# Patient Record
Sex: Male | Born: 1940
Health system: Southern US, Community
[De-identification: ages and names within clinical notes are randomized; demographics above are authoritative.]

## PROBLEM LIST (undated history)

## (undated) DIAGNOSIS — I10 Essential (primary) hypertension: Secondary | ICD-10-CM

## (undated) DIAGNOSIS — C719 Malignant neoplasm of brain, unspecified: Secondary | ICD-10-CM

## (undated) DIAGNOSIS — E7800 Pure hypercholesterolemia, unspecified: Secondary | ICD-10-CM

## (undated) DIAGNOSIS — E079 Disorder of thyroid, unspecified: Secondary | ICD-10-CM

## (undated) DIAGNOSIS — M1990 Unspecified osteoarthritis, unspecified site: Secondary | ICD-10-CM

## (undated) DIAGNOSIS — K219 Gastro-esophageal reflux disease without esophagitis: Secondary | ICD-10-CM

## (undated) DIAGNOSIS — E119 Type 2 diabetes mellitus without complications: Secondary | ICD-10-CM

## (undated) HISTORY — PX: HERNIA REPAIR: SHX51

## (undated) HISTORY — PX: OTHER SURGICAL HISTORY: SHX169

## (undated) HISTORY — PX: CRANIOTOMY: SHX93

---

## 2013-08-10 DIAGNOSIS — K4090 Unilateral inguinal hernia, without obstruction or gangrene, not specified as recurrent: Secondary | ICD-10-CM | POA: Insufficient documentation

## 2014-04-11 DIAGNOSIS — E785 Hyperlipidemia, unspecified: Secondary | ICD-10-CM | POA: Insufficient documentation

## 2015-01-16 DIAGNOSIS — E119 Type 2 diabetes mellitus without complications: Secondary | ICD-10-CM | POA: Insufficient documentation

## 2016-07-06 DIAGNOSIS — Z85828 Personal history of other malignant neoplasm of skin: Secondary | ICD-10-CM | POA: Diagnosis not present

## 2016-07-06 DIAGNOSIS — Z08 Encounter for follow-up examination after completed treatment for malignant neoplasm: Secondary | ICD-10-CM | POA: Diagnosis not present

## 2016-07-06 DIAGNOSIS — L821 Other seborrheic keratosis: Secondary | ICD-10-CM | POA: Diagnosis not present

## 2016-09-07 DIAGNOSIS — E119 Type 2 diabetes mellitus without complications: Secondary | ICD-10-CM | POA: Diagnosis not present

## 2016-09-07 DIAGNOSIS — E782 Mixed hyperlipidemia: Secondary | ICD-10-CM | POA: Diagnosis not present

## 2016-09-07 DIAGNOSIS — I10 Essential (primary) hypertension: Secondary | ICD-10-CM | POA: Diagnosis not present

## 2016-09-07 DIAGNOSIS — Z125 Encounter for screening for malignant neoplasm of prostate: Secondary | ICD-10-CM | POA: Diagnosis not present

## 2016-09-17 DIAGNOSIS — E119 Type 2 diabetes mellitus without complications: Secondary | ICD-10-CM | POA: Diagnosis not present

## 2016-09-17 DIAGNOSIS — Z125 Encounter for screening for malignant neoplasm of prostate: Secondary | ICD-10-CM | POA: Diagnosis not present

## 2016-09-17 DIAGNOSIS — I10 Essential (primary) hypertension: Secondary | ICD-10-CM | POA: Diagnosis not present

## 2016-09-17 DIAGNOSIS — E782 Mixed hyperlipidemia: Secondary | ICD-10-CM | POA: Diagnosis not present

## 2017-01-31 DIAGNOSIS — Z23 Encounter for immunization: Secondary | ICD-10-CM | POA: Diagnosis not present

## 2017-06-09 DIAGNOSIS — E668 Other obesity: Secondary | ICD-10-CM | POA: Diagnosis not present

## 2017-06-09 DIAGNOSIS — R000 Tachycardia, unspecified: Secondary | ICD-10-CM | POA: Diagnosis not present

## 2017-06-09 DIAGNOSIS — E7849 Other hyperlipidemia: Secondary | ICD-10-CM | POA: Diagnosis not present

## 2017-06-09 DIAGNOSIS — I10 Essential (primary) hypertension: Secondary | ICD-10-CM | POA: Diagnosis not present

## 2017-06-09 DIAGNOSIS — N401 Enlarged prostate with lower urinary tract symptoms: Secondary | ICD-10-CM | POA: Diagnosis not present

## 2017-06-09 DIAGNOSIS — Z1389 Encounter for screening for other disorder: Secondary | ICD-10-CM | POA: Diagnosis not present

## 2017-06-09 DIAGNOSIS — Z6833 Body mass index (BMI) 33.0-33.9, adult: Secondary | ICD-10-CM | POA: Diagnosis not present

## 2017-06-09 DIAGNOSIS — E119 Type 2 diabetes mellitus without complications: Secondary | ICD-10-CM | POA: Diagnosis not present

## 2017-06-09 DIAGNOSIS — D126 Benign neoplasm of colon, unspecified: Secondary | ICD-10-CM | POA: Diagnosis not present

## 2017-07-07 DIAGNOSIS — E119 Type 2 diabetes mellitus without complications: Secondary | ICD-10-CM | POA: Diagnosis not present

## 2017-07-07 DIAGNOSIS — Z6833 Body mass index (BMI) 33.0-33.9, adult: Secondary | ICD-10-CM | POA: Diagnosis not present

## 2017-07-07 DIAGNOSIS — I10 Essential (primary) hypertension: Secondary | ICD-10-CM | POA: Diagnosis not present

## 2017-10-11 DIAGNOSIS — Z6832 Body mass index (BMI) 32.0-32.9, adult: Secondary | ICD-10-CM | POA: Diagnosis not present

## 2017-10-11 DIAGNOSIS — I10 Essential (primary) hypertension: Secondary | ICD-10-CM | POA: Diagnosis not present

## 2017-10-11 DIAGNOSIS — N401 Enlarged prostate with lower urinary tract symptoms: Secondary | ICD-10-CM | POA: Diagnosis not present

## 2017-10-11 DIAGNOSIS — E119 Type 2 diabetes mellitus without complications: Secondary | ICD-10-CM | POA: Diagnosis not present

## 2017-10-11 DIAGNOSIS — E668 Other obesity: Secondary | ICD-10-CM | POA: Diagnosis not present

## 2017-10-11 DIAGNOSIS — E7849 Other hyperlipidemia: Secondary | ICD-10-CM | POA: Diagnosis not present

## 2018-01-05 DIAGNOSIS — Z23 Encounter for immunization: Secondary | ICD-10-CM | POA: Diagnosis not present

## 2018-02-08 DIAGNOSIS — D126 Benign neoplasm of colon, unspecified: Secondary | ICD-10-CM | POA: Diagnosis not present

## 2018-02-08 DIAGNOSIS — J3089 Other allergic rhinitis: Secondary | ICD-10-CM | POA: Diagnosis not present

## 2018-02-08 DIAGNOSIS — I10 Essential (primary) hypertension: Secondary | ICD-10-CM | POA: Diagnosis not present

## 2018-02-08 DIAGNOSIS — Z6830 Body mass index (BMI) 30.0-30.9, adult: Secondary | ICD-10-CM | POA: Diagnosis not present

## 2018-02-08 DIAGNOSIS — N401 Enlarged prostate with lower urinary tract symptoms: Secondary | ICD-10-CM | POA: Diagnosis not present

## 2018-02-08 DIAGNOSIS — E119 Type 2 diabetes mellitus without complications: Secondary | ICD-10-CM | POA: Diagnosis not present

## 2018-02-08 DIAGNOSIS — E7849 Other hyperlipidemia: Secondary | ICD-10-CM | POA: Diagnosis not present

## 2018-02-15 ENCOUNTER — Emergency Department (HOSPITAL_COMMUNITY): Payer: Medicare Other

## 2018-02-15 ENCOUNTER — Encounter (HOSPITAL_COMMUNITY): Payer: Self-pay | Admitting: Emergency Medicine

## 2018-02-15 ENCOUNTER — Observation Stay (HOSPITAL_COMMUNITY)
Admission: EM | Admit: 2018-02-15 | Discharge: 2018-02-16 | Disposition: A | Discharge status: Home / Self Care | Payer: Medicare Other | Attending: Internal Medicine | Admitting: Internal Medicine

## 2018-02-15 DIAGNOSIS — C719 Malignant neoplasm of brain, unspecified: Secondary | ICD-10-CM | POA: Diagnosis not present

## 2018-02-15 DIAGNOSIS — Z79899 Other long term (current) drug therapy: Secondary | ICD-10-CM | POA: Insufficient documentation

## 2018-02-15 DIAGNOSIS — I10 Essential (primary) hypertension: Secondary | ICD-10-CM | POA: Diagnosis not present

## 2018-02-15 DIAGNOSIS — G936 Cerebral edema: Secondary | ICD-10-CM | POA: Diagnosis not present

## 2018-02-15 DIAGNOSIS — R1310 Dysphagia, unspecified: Secondary | ICD-10-CM | POA: Diagnosis not present

## 2018-02-15 DIAGNOSIS — R911 Solitary pulmonary nodule: Secondary | ICD-10-CM | POA: Diagnosis not present

## 2018-02-15 DIAGNOSIS — Z87891 Personal history of nicotine dependence: Secondary | ICD-10-CM | POA: Diagnosis not present

## 2018-02-15 DIAGNOSIS — E119 Type 2 diabetes mellitus without complications: Secondary | ICD-10-CM | POA: Insufficient documentation

## 2018-02-15 DIAGNOSIS — Z7984 Long term (current) use of oral hypoglycemic drugs: Secondary | ICD-10-CM | POA: Diagnosis not present

## 2018-02-15 DIAGNOSIS — R29810 Facial weakness: Secondary | ICD-10-CM | POA: Diagnosis present

## 2018-02-15 DIAGNOSIS — N400 Enlarged prostate without lower urinary tract symptoms: Secondary | ICD-10-CM | POA: Insufficient documentation

## 2018-02-15 DIAGNOSIS — R479 Unspecified speech disturbances: Secondary | ICD-10-CM | POA: Diagnosis not present

## 2018-02-15 DIAGNOSIS — R262 Difficulty in walking, not elsewhere classified: Secondary | ICD-10-CM | POA: Diagnosis not present

## 2018-02-15 DIAGNOSIS — G9389 Other specified disorders of brain: Secondary | ICD-10-CM

## 2018-02-15 DIAGNOSIS — R0602 Shortness of breath: Secondary | ICD-10-CM | POA: Diagnosis not present

## 2018-02-15 DIAGNOSIS — E7800 Pure hypercholesterolemia, unspecified: Secondary | ICD-10-CM | POA: Diagnosis not present

## 2018-02-15 HISTORY — DX: Gastro-esophageal reflux disease without esophagitis: K21.9

## 2018-02-15 HISTORY — DX: Unspecified osteoarthritis, unspecified site: M19.90

## 2018-02-15 HISTORY — DX: Disorder of thyroid, unspecified: E07.9

## 2018-02-15 HISTORY — DX: Essential (primary) hypertension: I10

## 2018-02-15 HISTORY — DX: Type 2 diabetes mellitus without complications: E11.9

## 2018-02-15 HISTORY — DX: Pure hypercholesterolemia, unspecified: E78.00

## 2018-02-15 LAB — CBC
HCT: 38.9 % — ABNORMAL LOW (ref 39.0–52.0)
Hemoglobin: 12.9 g/dL — ABNORMAL LOW (ref 13.0–17.0)
MCH: 31.1 pg (ref 26.0–34.0)
MCHC: 33.2 g/dL (ref 30.0–36.0)
MCV: 93.7 fL (ref 80.0–100.0)
PLATELETS: 234 10*3/uL (ref 150–400)
RBC: 4.15 MIL/uL — ABNORMAL LOW (ref 4.22–5.81)
RDW: 12.3 % (ref 11.5–15.5)
WBC: 6.9 10*3/uL (ref 4.0–10.5)
nRBC: 0 % (ref 0.0–0.2)

## 2018-02-15 LAB — BASIC METABOLIC PANEL
Anion gap: 8 (ref 5–15)
BUN: 20 mg/dL (ref 8–23)
CALCIUM: 9.4 mg/dL (ref 8.9–10.3)
CO2: 23 mmol/L (ref 22–32)
CREATININE: 1.04 mg/dL (ref 0.61–1.24)
Chloride: 106 mmol/L (ref 98–111)
GFR calc Af Amer: >60 mL/min (ref >60)
GLUCOSE: 150 mg/dL — AB (ref 70–99)
Potassium: 4.1 mmol/L (ref 3.5–5.1)
Sodium: 137 mmol/L (ref 135–145)

## 2018-02-15 NOTE — ED Triage Notes (Signed)
Per family patient was seen a little over a week ago and was doing well.  For the last week his voice has been "different".  Family reports noticing a left sided facial droop at noon today and per wife patient has had difficulty walking for a while.  Unable to give a definitive LKW.

## 2018-02-16 ENCOUNTER — Inpatient Hospital Stay (HOSPITAL_COMMUNITY): Payer: Medicare Other

## 2018-02-16 ENCOUNTER — Encounter (HOSPITAL_COMMUNITY): Payer: Self-pay | Admitting: Internal Medicine

## 2018-02-16 ENCOUNTER — Other Ambulatory Visit: Payer: Self-pay

## 2018-02-16 ENCOUNTER — Other Ambulatory Visit: Payer: Self-pay | Admitting: Radiation Therapy

## 2018-02-16 DIAGNOSIS — D496 Neoplasm of unspecified behavior of brain: Secondary | ICD-10-CM

## 2018-02-16 DIAGNOSIS — N281 Cyst of kidney, acquired: Secondary | ICD-10-CM | POA: Diagnosis not present

## 2018-02-16 DIAGNOSIS — E7800 Pure hypercholesterolemia, unspecified: Secondary | ICD-10-CM | POA: Diagnosis not present

## 2018-02-16 DIAGNOSIS — G936 Cerebral edema: Secondary | ICD-10-CM | POA: Diagnosis not present

## 2018-02-16 DIAGNOSIS — G9389 Other specified disorders of brain: Secondary | ICD-10-CM

## 2018-02-16 DIAGNOSIS — R29810 Facial weakness: Secondary | ICD-10-CM

## 2018-02-16 DIAGNOSIS — C719 Malignant neoplasm of brain, unspecified: Secondary | ICD-10-CM | POA: Diagnosis not present

## 2018-02-16 DIAGNOSIS — Z87891 Personal history of nicotine dependence: Secondary | ICD-10-CM

## 2018-02-16 DIAGNOSIS — K7689 Other specified diseases of liver: Secondary | ICD-10-CM | POA: Diagnosis not present

## 2018-02-16 DIAGNOSIS — I10 Essential (primary) hypertension: Secondary | ICD-10-CM | POA: Diagnosis not present

## 2018-02-16 DIAGNOSIS — E119 Type 2 diabetes mellitus without complications: Secondary | ICD-10-CM | POA: Diagnosis not present

## 2018-02-16 DIAGNOSIS — N210 Calculus in bladder: Secondary | ICD-10-CM | POA: Diagnosis not present

## 2018-02-16 DIAGNOSIS — R911 Solitary pulmonary nodule: Secondary | ICD-10-CM | POA: Diagnosis not present

## 2018-02-16 DIAGNOSIS — G510 Bell's palsy: Secondary | ICD-10-CM | POA: Diagnosis not present

## 2018-02-16 DIAGNOSIS — G939 Disorder of brain, unspecified: Secondary | ICD-10-CM | POA: Diagnosis not present

## 2018-02-16 LAB — GLUCOSE, CAPILLARY
GLUCOSE-CAPILLARY: 173 mg/dL — AB (ref 70–99)
Glucose-Capillary: 174 mg/dL — ABNORMAL HIGH (ref 70–99)

## 2018-02-16 LAB — CBC
HEMATOCRIT: 36.1 % — AB (ref 39.0–52.0)
HEMOGLOBIN: 11.7 g/dL — AB (ref 13.0–17.0)
MCH: 29.9 pg (ref 26.0–34.0)
MCHC: 32.4 g/dL (ref 30.0–36.0)
MCV: 92.3 fL (ref 80.0–100.0)
NRBC: 0 % (ref 0.0–0.2)
Platelets: 211 10*3/uL (ref 150–400)
RBC: 3.91 MIL/uL — ABNORMAL LOW (ref 4.22–5.81)
RDW: 12.4 % (ref 11.5–15.5)
WBC: 6.4 10*3/uL (ref 4.0–10.5)

## 2018-02-16 LAB — BASIC METABOLIC PANEL
Anion gap: 6 (ref 5–15)
BUN: 19 mg/dL (ref 8–23)
CHLORIDE: 107 mmol/L (ref 98–111)
CO2: 23 mmol/L (ref 22–32)
Calcium: 9 mg/dL (ref 8.9–10.3)
Creatinine, Ser: 1.11 mg/dL (ref 0.61–1.24)
GFR calc Af Amer: >60 mL/min (ref >60)
GFR calc non Af Amer: >60 mL/min (ref >60)
Glucose, Bld: 192 mg/dL — ABNORMAL HIGH (ref 70–99)
POTASSIUM: 4.4 mmol/L (ref 3.5–5.1)
Sodium: 136 mmol/L (ref 135–145)

## 2018-02-16 LAB — PROTIME-INR
INR: 1.12
Prothrombin Time: 14.3 seconds (ref 11.4–15.2)

## 2018-02-16 LAB — HEMOGLOBIN A1C
Hgb A1c MFr Bld: 6.4 % — ABNORMAL HIGH (ref 4.8–5.6)
MEAN PLASMA GLUCOSE: 136.98 mg/dL

## 2018-02-16 LAB — APTT: APTT: 29 s (ref 24–36)

## 2018-02-16 LAB — PSA: Prostatic Specific Antigen: 2.72 ng/mL (ref 0.00–4.00)

## 2018-02-16 MED ORDER — FAMOTIDINE IN NACL 20-0.9 MG/50ML-% IV SOLN
20.0000 mg | Freq: Two times a day (BID) | INTRAVENOUS | Status: DC | PRN
  Filled 2018-02-16: qty 50

## 2018-02-16 MED ORDER — DEXAMETHASONE SODIUM PHOSPHATE 10 MG/ML IJ SOLN
4.0000 mg | Freq: Once | INTRAMUSCULAR | Status: AC
  Administered 2018-02-16: 4 mg via INTRAVENOUS
  Filled 2018-02-16: qty 1

## 2018-02-16 MED ORDER — ACETAMINOPHEN 650 MG RE SUPP: 650.0000 mg | Freq: Four times a day (QID) | RECTAL | Status: DC | PRN

## 2018-02-16 MED ORDER — ONDANSETRON HCL 4 MG/2ML IJ SOLN: 4.0000 mg | Freq: Four times a day (QID) | INTRAMUSCULAR | Status: DC | PRN

## 2018-02-16 MED ORDER — ONDANSETRON HCL 4 MG PO TABS: 4.0000 mg | ORAL_TABLET | Freq: Four times a day (QID) | ORAL | Status: DC | PRN

## 2018-02-16 MED ORDER — PNEUMOCOCCAL VAC POLYVALENT 25 MCG/0.5ML IJ INJ: 0.5000 mL | INJECTION | INTRAMUSCULAR | Status: DC

## 2018-02-16 MED ORDER — LISINOPRIL 10 MG PO TABS
10.0000 mg | ORAL_TABLET | Freq: Every day | ORAL | Status: DC
  Administered 2018-02-16: 10 mg via ORAL
  Filled 2018-02-16: qty 1

## 2018-02-16 MED ORDER — HEPARIN SODIUM (PORCINE) 5000 UNIT/ML IJ SOLN
5000.0000 [IU] | Freq: Three times a day (TID) | INTRAMUSCULAR | Status: DC
  Administered 2018-02-16 (×2): 5000 [IU] via SUBCUTANEOUS
  Filled 2018-02-16: qty 1

## 2018-02-16 MED ORDER — ACETAMINOPHEN 325 MG PO TABS: 650.0000 mg | ORAL_TABLET | Freq: Four times a day (QID) | ORAL | Status: DC | PRN

## 2018-02-16 MED ORDER — HYDRALAZINE HCL 20 MG/ML IJ SOLN: 5.0000 mg | INTRAMUSCULAR | Status: DC | PRN

## 2018-02-16 MED ORDER — DEXAMETHASONE 4 MG PO TABS
4.0000 mg | ORAL_TABLET | Freq: Four times a day (QID) | ORAL | Status: DC
  Administered 2018-02-16 (×2): 4 mg via ORAL
  Filled 2018-02-16 (×2): qty 1

## 2018-02-16 MED ORDER — SODIUM CHLORIDE 0.9 % IV SOLN
INTRAVENOUS | Status: DC
  Administered 2018-02-16: 06:00:00 via INTRAVENOUS

## 2018-02-16 MED ORDER — GADOBUTROL 1 MMOL/ML IV SOLN
7.5000 mL | Freq: Once | INTRAVENOUS | Status: AC | PRN
  Administered 2018-02-16: 7.5 mL via INTRAVENOUS

## 2018-02-16 MED ORDER — ATORVASTATIN CALCIUM 40 MG PO TABS: 40.0000 mg | ORAL_TABLET | Freq: Every day | ORAL | Status: DC

## 2018-02-16 MED ORDER — IOHEXOL 300 MG/ML  SOLN: 100.0000 mL | Freq: Once | INTRAMUSCULAR | Status: DC | PRN

## 2018-02-16 MED ORDER — DEXAMETHASONE 4 MG PO TABS: 4.0000 mg | ORAL_TABLET | Freq: Every day | ORAL | 0 refills | Status: DC

## 2018-02-16 MED ORDER — IOHEXOL 300 MG/ML  SOLN
100.0000 mL | Freq: Once | INTRAMUSCULAR | Status: AC | PRN
  Administered 2018-02-16: 100 mL via INTRAVENOUS

## 2018-02-16 MED ORDER — FAMOTIDINE 20 MG PO TABS: 20.0000 mg | ORAL_TABLET | Freq: Two times a day (BID) | ORAL | Status: DC | PRN

## 2018-02-16 NOTE — Discharge Summary (Signed)
Physician Discharge Summary  Bryan [name] TIR:[id number] DOB: 04-Apr-1940 DOA: 02/15/2018  PCP: Reynold Bowen, MD  Admit date: 02/15/2018 Discharge date: 02/16/2018  Admitted From: Home  Disposition:  Home   Recommendations for Outpatient Follow-up and new medication changes:  1. Follow up with Dr. Forde Dandy in 7 days.  2. Follow up with Dr. Mickeal Skinner as scheduled (neuro-oncologist). 3. Patient will take 4 mg dexamethasone daily for brain edema.   Home Health: no   Equipment/Devices: no    Discharge Condition: stable  CODE STATUS: full  Diet recommendation: heart healthy and diabetic prudent   Brief/Interim Summary: 77 year old male who presented with left facial droop, difficulty swallowing and unsteady gait.  He does have significant past medical history for hypertension, dyslipidemia, type 2 diabetes mellitus and GERD.  Reported persistent and  worsening symptoms for about 3 weeks, consistent with difficulty swallowing and shuffling gait.  On the day of admission he was noted to have a left facial droop.  On his initial physical examination blood pressure 146/61, heart rate 80, respiratory rate 16, temperature 98.2, oxygen saturation 97%, lungs were clear to auscultation bilaterally, heart S1-S2 present rhythmic, abdomen soft nontender, no lower extremity edema, patient was somnolent, positive for left facial droop.  Sodium 137, potassium 4.1, chloride 106, bicarb 23, glucose 150, BUN 20, creatinine 1.0, white count 6.9, hemoglobin 12.9, hematocrit 38.9, platelets 234.  Head CT with 3.9 x 3.3 x 2.9 cm cystic and solid mass within the right hemisphere with localized mass-effect and approximately 5 mm midline shift to the left.  Moderate edema surrounding the mass lesion.  CT chest and abdomen with no obvious malignancy.  Positive 14 x 9 mm stone within the urinary bladder.  EKG normal sinus rhythm, normal axis normal intervals.  Patient was admitted to the hospital with a working diagnosis of  focal neurologic deficit due to brain mass.  1. 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 Patient continue to have left facial droop, no headache, nausea, vomiting or double vision. Continue systemic steroids with dexamethasone 4 mg daily per neuro-oncology recommendations. Patient will be discharged today. Follow up with neurosurgery as outpatient.   2. T2DM, Patient will continue metformin for glucose control, close follow up on glucose due to risk of steroid induced hyperglycemia. Today fasting glucose was 192 and capillary glucose 173.   2. HTN. Continue  lisinopril, for blood pressure control.   3. Dyslipidemia. Continue with atorvastatin.     Discharge Diagnoses:  Principal Problem:   Brain mass Active Problems:   Diabetes mellitus without complication ([place])   Hypercholesterolemia   Hypertension    Discharge Instructions   Allergies as of 02/16/2018   No Known Allergies     Medication List    TAKE these medications   atorvastatin 40 MG tablet Commonly known as:  LIPITOR Take 40 mg by mouth daily.   dexamethasone 4 MG tablet Commonly known as:  DECADRON Take 1 tablet (4 mg total) by mouth daily.   lisinopril 10 MG tablet Commonly known as:  PRINIVIL,ZESTRIL Take 10 mg by mouth daily.   metFORMIN 500 MG tablet Commonly known as:  GLUCOPHAGE Take 1,000 mg by mouth 2 (two) times daily with a meal.       No Known Allergies  Consultations:  Neuro-oncology   Neurosurgery    Procedures/Studies: Ct Head Wo Contrast  Result Date: 02/15/2018 CLINICAL DATA:  Difficulty speaking and walking EXAM: CT HEAD  WITHOUT CONTRAST TECHNIQUE: Contiguous axial images were obtained from the base of the skull through the vertex without intravenous contrast. COMPARISON:  None. FINDINGS: Brain: No hemorrhage or acute territorial infarct. Cystic and solid mass within the right basal  ganglial region measuring 3.9 x 3.3 by 2.9 cm. Localized mass effect with compression of the right lateral ventricle. Approximately 5 mm midline shift to the left. Hypodense edema surrounding the mass. Edema within the right temporal. Mild ventricular enlargement. Vascular: No hyperdense vessels.  Carotid vascular calcification. Skull: Normal. Negative for fracture or focal lesion. Sinuses/Orbits: Complete opacification of left maxillary sinus. Mucosal thickening in the left ethmoid sinus. Other: None IMPRESSION: 3.9 x 3.3 x 2.9 cm cystic and solid mass within the right hemisphere with localized mass effect and approximately 5 mm midline shift to the left. Moderate edema surrounding the mass lesion. Findings could be secondary to metastatic disease, primary brain mass, or possibly infection. Contrast-enhanced MRI is suggested for further evaluation. Electronically Signed   By: Donavan Foil M.D.   On: 02/15/2018 23:30   Ct Chest W Contrast  Result Date: 02/16/2018 CLINICAL DATA:  Shortness of breath. Left facial droop. Brain mass. Assessment for malignancy within the chest, abdomen or pelvis. EXAM: CT CHEST, ABDOMEN, AND PELVIS WITH CONTRAST TECHNIQUE: Multidetector CT imaging of the chest, abdomen and pelvis was performed following the standard protocol during bolus administration of intravenous contrast. CONTRAST:  169mL OMNIPAQUE IOHEXOL 300 MG/ML  SOLN COMPARISON:  None. FINDINGS: CT CHEST FINDINGS Cardiovascular: There is calcific aortic atherosclerosis. Heart size is normal. Mediastinum/Nodes: No mediastinal, hilar or axillary lymphadenopathy. The visualized thyroid and thoracic esophageal course are unremarkable. Lungs/Pleura: There is a 4 mm nodule of the left lung base. The lungs are otherwise clear. Musculoskeletal: No chest wall mass or suspicious bone lesions identified. CT ABDOMEN PELVIS FINDINGS HEPATOBILIARY: The hepatic contours and density are normal. There is no intra- or extrahepatic biliary  dilatation. Small cyst in the left hepatic lobe. The gallbladder is normal. PANCREAS: The pancreatic parenchymal contours are normal and there is no ductal dilatation. There is no peripancreatic fluid collection. SPLEEN: Normal. ADRENALS/URINARY TRACT: --Adrenal glands: Normal. --Right kidney/ureter: There is a lower pole cyst measuring 4.0 cm. No hydronephrosis, nephroureterolithiasis, perinephric stranding or solid renal mass. --Left kidney/ureter: No hydronephrosis, nephroureterolithiasis, perinephric stranding or solid renal mass. --Urinary bladder: There is a stone within the dependent portion of the urinary bladder measuring 14 x 9 mm. STOMACH/BOWEL: --Stomach/Duodenum: There is no hiatal hernia or other gastric abnormality. The duodenal course and caliber are normal. --Small bowel: No dilatation or inflammation. --Colon: No focal abnormality. --Appendix: Normal. VASCULAR/LYMPHATIC: Atherosclerotic calcification is present within the non-aneurysmal abdominal aorta, without hemodynamically significant stenosis. The portal vein, splenic vein, superior mesenteric vein and IVC are patent. No abdominal or pelvic lymphadenopathy. REPRODUCTIVE: Enlarged, heterogeneous prostate gland measures 6.3 x 5.7 x 7.7 cm. The seminal vesicles are normal. MUSCULOSKELETAL. No bony spinal canal stenosis or focal osseous abnormality. OTHER: There is a small, fluid containing left inguinal hernia. Sequelae of prior hernia repair in the anterior left abdomen. IMPRESSION: 1. No acute abnormality of the chest, abdomen or pelvis. 2. 14 x 9 mm stone within the dependent portion of the urinary bladder. 3. Enlarged, heterogeneous prostate gland. Correlate with PSA. 4. 4 mm left lung base pulmonary nodule. No follow-up needed if patient is low-risk. Non-contrast chest CT can be considered in 12 months if patient is high-risk. This recommendation follows the consensus statement: Guidelines for Management of Incidental Pulmonary Nodules  Detected on CT Images: From the Fleischner Society 2017; Radiology 2017; [phone]. Aortic Atherosclerosis (ICD10-I70.0). Electronically Signed   By: Ulyses Jarred M.D.   On: 02/16/2018 03:43   Mr Brain W And Wo Contrast  Result Date: 02/16/2018 CLINICAL DATA:  Brain mass.  Facial paralysis. EXAM: MRI HEAD WITHOUT AND WITH CONTRAST TECHNIQUE: Multiplanar, multiecho pulse sequences of the brain and surrounding structures were obtained without and with intravenous contrast. CONTRAST:  7 mL Gadavist COMPARISON:  Head CT 02/15/2018 FINDINGS: BRAIN: There is a mass centered in the right frontal white matter with a large cystic/necrotic component. There is a solid component at the lateral aspect, measuring 2.5 x 1.4 x 2.7 cm. The solid component at the inferior aspect measures 3.7 x 2.3 x 1.1 cm. Including the cystic component, the complex measures 4.0 x 4.6 x 4.0 cm. There is no diffusion restriction within the mass. There is hyperintense T2-weighted signal that extends to the right insula, along the right internal capsule and into the right temporal white matter. There are scattered foci of white matter hyperintensity within both hemispheres otherwise. The cerebral and cerebellar volume are age-appropriate. Susceptibility-sensitive sequences show no chronic microhemorrhage or superficial siderosis. Leftward midline shift measures 4 mm. VASCULAR: Major intracranial arterial and venous sinus flow voids are normal. SKULL AND UPPER CERVICAL SPINE: Calvarial bone marrow signal is normal. There is no skull base mass. Visualized upper cervical spine and soft tissues are normal. SINUSES/ORBITS: There is complete opacification of the left maxillary sinus. The orbits are normal. IMPRESSION: 1. 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 Surrounding hyperintense T2-weighted signal likely indicates a combination of  edema and nonenhancing tumor. There is no diffusion restriction within the necrotic component to suggest abscess. 2. Leftward midline shift measures 4 mm. Electronically Signed   By: Ulyses Jarred M.D.   On: 02/16/2018 03:11   Ct Abdomen Pelvis W Contrast  Result Date: 02/16/2018 CLINICAL DATA:  Shortness of breath. Left facial droop. Brain mass. Assessment for malignancy within the chest, abdomen or pelvis. EXAM: CT CHEST, ABDOMEN, AND PELVIS WITH CONTRAST TECHNIQUE: Multidetector CT imaging of the chest, abdomen and pelvis was performed following the standard protocol during bolus administration of intravenous contrast. CONTRAST:  138mL OMNIPAQUE IOHEXOL 300 MG/ML  SOLN COMPARISON:  None. FINDINGS: CT CHEST FINDINGS Cardiovascular: There is calcific aortic atherosclerosis. Heart size is normal. Mediastinum/Nodes: No mediastinal, hilar or axillary lymphadenopathy. The visualized thyroid and thoracic esophageal course are unremarkable. Lungs/Pleura: There is a 4 mm nodule of the left lung base. The lungs are otherwise clear. Musculoskeletal: No chest wall mass or suspicious bone lesions identified. CT ABDOMEN PELVIS FINDINGS HEPATOBILIARY: The hepatic contours and density are normal. There is no intra- or extrahepatic biliary dilatation. Small cyst in the left hepatic lobe. The gallbladder is normal. PANCREAS: The pancreatic parenchymal contours are normal and there is no ductal dilatation. There is no peripancreatic fluid collection. SPLEEN: Normal. ADRENALS/URINARY TRACT: --Adrenal glands: Normal. --Right kidney/ureter: There is a lower pole cyst measuring 4.0 cm. No hydronephrosis, nephroureterolithiasis, perinephric stranding or solid renal mass. --Left kidney/ureter: No hydronephrosis, nephroureterolithiasis, perinephric stranding or solid renal mass. --Urinary bladder: There is a stone within the dependent portion of the urinary bladder measuring 14 x 9 mm. STOMACH/BOWEL: --Stomach/Duodenum: There is no  hiatal hernia or other gastric abnormality. The duodenal course and caliber are normal. --Small bowel: No dilatation or inflammation. --Colon: No focal abnormality. --  Appendix: Normal. VASCULAR/LYMPHATIC: Atherosclerotic calcification is present within the non-aneurysmal abdominal aorta, without hemodynamically significant stenosis. The portal vein, splenic vein, superior mesenteric vein and IVC are patent. No abdominal or pelvic lymphadenopathy. REPRODUCTIVE: Enlarged, heterogeneous prostate gland measures 6.3 x 5.7 x 7.7 cm. The seminal vesicles are normal. MUSCULOSKELETAL. No bony spinal canal stenosis or focal osseous abnormality. OTHER: There is a small, fluid containing left inguinal hernia. Sequelae of prior hernia repair in the anterior left abdomen. IMPRESSION: 1. No acute abnormality of the chest, abdomen or pelvis. 2. 14 x 9 mm stone within the dependent portion of the urinary bladder. 3. Enlarged, heterogeneous prostate gland. Correlate with PSA. 4. 4 mm left lung base pulmonary nodule. No follow-up needed if patient is low-risk. Non-contrast chest CT can be considered in 12 months if patient is high-risk. This recommendation follows the consensus statement: Guidelines for Management of Incidental Pulmonary Nodules Detected on CT Images: From the Fleischner Society 2017; Radiology 2017; [id number]. Aortic Atherosclerosis (ICD10-I70.0). Electronically Signed   By: Ulyses Jarred M.D.   On: 02/16/2018 03:43       Subjective: Patient continue to have left facial droop, no nausea or vomiting, no chest pain or dyspnea. Has been started with systemic steroids.    Discharge Exam: Vitals:   02/16/18 0555 02/16/18 0726  BP: 135/78 (!) 161/80  Pulse: 63 (!) 58  Resp: 18 18  Temp: 97.9 F (36.6 C) 97.9 F (36.6 C)  SpO2: 94% 100%   Vitals:   02/16/18 0330 02/16/18 0355 02/16/18 0555 02/16/18 0726  BP: 125/72 (!) 146/68 135/78 (!) 161/80  Pulse: 75 76 63 (!) 58  Resp:  18 18 18   Temp:   97.9 F (36.6 C) 97.9 F (36.6 C) 97.9 F (36.6 C)  TempSrc:  Oral Oral Oral  SpO2: 97% 96% 94% 100%  Weight:  79.5 kg    Height:  5\' 5"  (1.651 m)      General: mild deconditioned  Neurology: Awake and alert, left facial droop.  E ENT: no pallor, no icterus, oral mucosa moist/ Cardiovascular: No JVD. S1-S2 present, rhythmic, no gallops, rubs, or murmurs. No lower extremity edema. Pulmonary: positive breath sounds bilaterally, adequate air movement, no wheezing, rhonchi or rales. Gastrointestinal. Abdomen with no organomegaly, non tender, no rebound or guarding Skin. No rashes Musculoskeletal: no joint deformities   The results of significant diagnostics from this hospitalization (including imaging, microbiology, ancillary and laboratory) are listed below for reference.     Microbiology: No results found for this or any previous visit (from the past 240 hour(s)).   Labs: BNP (last 3 results) No results for input(s): BNP in the last 8760 hours. Basic Metabolic Panel: Recent Labs  Lab 02/15/18 2226 02/16/18 0546  NA 137 136  K 4.1 4.4  CL 106 107  CO2 23 23  GLUCOSE 150* 192*  BUN 20 19  CREATININE 1.04 1.11  CALCIUM 9.4 9.0   Liver Function Tests: No results for input(s): AST, ALT, ALKPHOS, BILITOT, PROT, ALBUMIN in the last 168 hours. No results for input(s): LIPASE, AMYLASE in the last 168 hours. No results for input(s): AMMONIA in the last 168 hours. CBC: Recent Labs  Lab 02/15/18 2226 02/16/18 0546  WBC 6.9 6.4  HGB 12.9* 11.7*  HCT 38.9* 36.1*  MCV 93.7 92.3  PLT 234 211   Cardiac Enzymes: No results for input(s): CKTOTAL, CKMB, CKMBINDEX, TROPONINI in the last 168 hours. BNP: Invalid input(s): POCBNP CBG: Recent Labs  Lab 02/16/18 0602  GLUCAP  173*   D-Dimer No results for input(s): DDIMER in the last 72 hours. Hgb A1c Recent Labs    02/16/18 0546  HGBA1C 6.4*   Lipid Profile No results for input(s): CHOL, HDL, LDLCALC, TRIG, CHOLHDL,  LDLDIRECT in the last 72 hours. Thyroid function studies No results for input(s): TSH, T4TOTAL, T3FREE, THYROIDAB in the last 72 hours.  Invalid input(s): FREET3 Anemia work up No results for input(s): VITAMINB12, FOLATE, FERRITIN, TIBC, IRON, RETICCTPCT in the last 72 hours. Urinalysis No results found for: COLORURINE, APPEARANCEUR, LABSPEC, [place], GLUCOSEU, HGBUR, BILIRUBINUR, KETONESUR, PROTEINUR, UROBILINOGEN, NITRITE, LEUKOCYTESUR Sepsis Labs Invalid input(s): PROCALCITONIN,  WBC,  LACTICIDVEN Microbiology No results found for this or any previous visit (from the past 240 hour(s)).   Time coordinating discharge: 45 minutes  SIGNED:   Tawni Millers, MD  [facility] 02/16/2018, 3:47 PM Pager [phone]  If 7PM-7AM, please contact night-coverage [email or web address] Password [email or web address]

## 2018-02-16 NOTE — H&P (Signed)
History and Physical    Bryan [name] PFX:[id number] DOB: 01/21/1941 DOA: 02/15/2018  Referring MD/NP/PA:   PCP: Reynold Bowen, MD   Patient coming from:  The patient is coming from home.  At baseline, pt is independent for most of ADL.        Chief Complaint: Left facial droop, difficulty swallowing, unsteady gait  HPI: Bryan [name] is a 77 y.o. male with medical history significant of hypertension, hyperlipidemia, diabetes mellitus, GERD, who presents with left facial droop, difficulty swallowing, unsteady gait.  Patient has been having difficulty swallowing and shuffling gait for more than 3 weeks, which has been gradually worsening.  He also has generalized weakness and sleeping through the day for longer periods. Today pt was found to have left facial droop. No vision change or hearing loss.  Patient denies unilateral weakness or numbness in extremities. Pt was given Rx for an antihistamine by his PCP without help.  Patient denies chest pain, shortness of breath, fever or chills.  No nausea, vomiting, diarrhea or abdominal pain.  Denies symptoms of UTI.  ED Course: pt was found to have WBC 6.9, electrolytes renal function okay, temperature normal, no tachycardia, no tachypnea, oxygen saturation 97% on room air.  CT of the head showed brain mass with 5 mm of midline shift.  Patient is admitted to telemetry bed as inpatient.  Neurosurgeon was consulted.  Review of Systems:   General: no fevers, chills, no body weight gain, has fatigue HEENT: no blurry vision, hearing changes or sore throat Respiratory: no dyspnea, coughing, wheezing CV: no chest pain, no palpitations GI: no nausea, vomiting, abdominal pain, diarrhea, constipation GU: no dysuria, burning on urination, increased urinary frequency, hematuria  Ext: no leg edema Neuro: Left facial droop, difficulty swallowing, unsteady gait Skin: no rash, no skin tear. MSK: No muscle spasm, no deformity, no limitation of range of  movement in spin Heme: No easy bruising.  Travel history: No recent long distant travel.  Allergy: No Known Allergies  Past Medical History:  Diagnosis Date  . Arthritis   . Diabetes mellitus without complication ([place])   . GERD (gastroesophageal reflux disease)   . Hypercholesterolemia   . Hypertension   . Thyroid disease     Past Surgical History:  Procedure Laterality Date  . HERNIA REPAIR      Social History:  reports that he has quit smoking. He has never used smokeless tobacco. He reports that he drinks about 1.0 standard drinks of alcohol per week. He reports that he does not use drugs.  Family History:  Family History  Problem Relation Age of Onset  . Diabetes Mellitus II Mother   . ALS Father      Prior to Admission medications   Medication Sig Start Date End Date Taking? Authorizing Provider  atorvastatin (LIPITOR) 40 MG tablet Take 40 mg by mouth daily.   Yes [provider]  lisinopril (PRINIVIL,ZESTRIL) 10 MG tablet Take 10 mg by mouth daily.   Yes [provider]  metFORMIN (GLUCOPHAGE) 500 MG tablet Take 1,000 mg by mouth 2 (two) times daily with a meal.   Yes [provider]    Physical Exam: Vitals:   02/15/18 2216 02/15/18 2218 02/16/18 0030  BP: (!) 146/61  118/82  Pulse: 80  79  Resp: 16  17  Temp: 98.2 F (36.8 C)    TempSrc: Oral    SpO2: 97%  97%  Weight:  79.8 kg   Height:  5\' 5"  (1.651 m)  General: Not in acute distress HEENT:       Eyes: PERRL, EOMI, no scleral icterus.       ENT: No discharge from the ears and nose, no pharynx injection, no tonsillar enlargement.        Neck: No JVD, no bruit, no mass felt. Heme: No neck lymph node enlargement. Cardiac: S1/S2, RRR, No murmurs, No gallops or rubs. Respiratory: No rales, wheezing, rhonchi or rubs. GI: Soft, nondistended, nontender, no rebound pain, no organomegaly, BS present. GU: No hematuria Ext: No pitting leg edema bilaterally. 2+DP/PT pulse  bilaterally. Musculoskeletal: No joint deformities, No joint redness or warmth, no limitation of ROM in spin. Skin: No rashes.  Neuro: mildly somnolent, oriented X3, cranial nerves II-XII grossly intact except for left facial droop, moves all extremities normally.  Psych: Patient is not psychotic, no suicidal or hemocidal ideation.  Labs on Admission: I have personally reviewed following labs and imaging studies  CBC: Recent Labs  Lab 02/15/18 2226  WBC 6.9  HGB 12.9*  HCT 38.9*  MCV 93.7  PLT 528   Basic Metabolic Panel: Recent Labs  Lab 02/15/18 2226  NA 137  K 4.1  CL 106  CO2 23  GLUCOSE 150*  BUN 20  CREATININE 1.04  CALCIUM 9.4   GFR: Estimated Creatinine Clearance: 58.8 mL/min (by C-G formula based on SCr of 1.04 mg/dL). Liver Function Tests: No results for input(s): AST, ALT, ALKPHOS, BILITOT, PROT, ALBUMIN in the last 168 hours. No results for input(s): LIPASE, AMYLASE in the last 168 hours. No results for input(s): AMMONIA in the last 168 hours. Coagulation Profile: No results for input(s): INR, PROTIME in the last 168 hours. Cardiac Enzymes: No results for input(s): CKTOTAL, CKMB, CKMBINDEX, TROPONINI in the last 168 hours. BNP (last 3 results) No results for input(s): PROBNP in the last 8760 hours. HbA1C: No results for input(s): HGBA1C in the last 72 hours. CBG: No results for input(s): GLUCAP in the last 168 hours. Lipid Profile: No results for input(s): CHOL, HDL, LDLCALC, TRIG, CHOLHDL, LDLDIRECT in the last 72 hours. Thyroid Function Tests: No results for input(s): TSH, T4TOTAL, FREET4, T3FREE, THYROIDAB in the last 72 hours. Anemia Panel: No results for input(s): VITAMINB12, FOLATE, FERRITIN, TIBC, IRON, RETICCTPCT in the last 72 hours. Urine analysis: No results found for: COLORURINE, APPEARANCEUR, LABSPEC, PHURINE, GLUCOSEU, HGBUR, BILIRUBINUR, KETONESUR, PROTEINUR, UROBILINOGEN, NITRITE, LEUKOCYTESUR Sepsis  Labs: @LABRCNTIP (procalcitonin:4,lacticidven:4) )No results found for this or any previous visit (from the past 240 hour(s)).   Radiological Exams on Admission: Ct Head Wo Contrast  Result Date: 02/15/2018 CLINICAL DATA:  Difficulty speaking and walking EXAM: CT HEAD WITHOUT CONTRAST TECHNIQUE: Contiguous axial images were obtained from the base of the skull through the vertex without intravenous contrast. COMPARISON:  None. FINDINGS: Brain: No hemorrhage or acute territorial infarct. Cystic and solid mass within the right basal ganglial region measuring 3.9 x 3.3 by 2.9 cm. Localized mass effect with compression of the right lateral ventricle. Approximately 5 mm midline shift to the left. Hypodense edema surrounding the mass. Edema within the right temporal. Mild ventricular enlargement. Vascular: No hyperdense vessels.  Carotid vascular calcification. Skull: Normal. Negative for fracture or focal lesion. Sinuses/Orbits: Complete opacification of left maxillary sinus. Mucosal thickening in the left ethmoid sinus. Other: None IMPRESSION: 3.9 x 3.3 x 2.9 cm cystic and solid mass within the right hemisphere with localized mass effect and approximately 5 mm midline shift to the left. Moderate edema surrounding the mass lesion. Findings could be secondary to  metastatic disease, primary brain mass, or possibly infection. Contrast-enhanced MRI is suggested for further evaluation. Electronically Signed   By: Donavan Foil M.D.   On: 02/15/2018 23:30     EKG: Independently reviewed.  Sinus rhythm, QTC 405, RAD, poor R wave progression, mild T wave inversion in lead III/aVF.  Assessment/Plan Principal Problem:   Brain mass Active Problems:   Diabetes mellitus without complication (HCC)   Hypercholesterolemia   Hypertension   Brain mass: CT scan showed 3.9 x 3.3 x 2.9 cm cystic and solid mass within the right hemisphere, with localized mass effect and approximately 5 mm midline shift to the left, with  moderate edema surrounding the mass lesion.  Neurosurgeon PA, [place] was consulted by EDP.  -will admit to tele bed as inpt -Decadron 4 mg every 6 hours -Frequent neuro check -MRI for brain with and without contrast was ordered by EDP -Metastasized disease work-up: CT of chest and CT of abdomen/pelvis with and without contrast; check PSA -keep pt NPO and get swallowing screen and SLP due to difficulty swallowing   Diabetes mellitus without complication ([place]): no B6L on record. He used to be on metformin, which was discontinued by his PCP due to well-controlled diabetes. -Check blood sugar every morning - Check A1c  Hypercholesterolemia: -hold home Lipitor due to difficulty swallowing  Hypertension: -IV hydralazine PRN -Hold home oral lisinopril     Inpatient status:  # Patient requires inpatient status due to high intensity of service, high risk for further deterioration and high frequency of surveillance required.  I certify that at the point of admission it is my clinical judgment that the patient will require inpatient hospital care spanning beyond 2 midnights from the point of admission.  . This patient has multiple chronic comorbidities including hyperlipidemia, hypertension, diabetes mellitus, GERD. . Now patient has presenting symptoms include left facial droop, difficulty swallowing, and unsteady gait . The worrisome physical exam findings include somnolence, left facial droop . The initial radiographic and laboratory data are worrisome because of brain mass with midline shift mass-effect . Current medical needs: please see my assessment and plan . Predictability of an adverse outcome (risk): Patient was found to have brain mass with midline shift mass-effect.  Patient will need to have metastatic disease work-up, and possible neurosurgical procedure.  Most likely will need to stay in hospital for more than 2 days.        DVT ppx: SQ Heparin   Code Status: Full  code Family Communication: None at bed side.   Disposition Plan:  Anticipate discharge back to previous home environment Consults called:  neurosugeon Admission status:  Inpatient/tele    Date of Service 02/16/2018    Ivor Costa [facility] Pager [phone]  If 7PM-7AM, please contact night-coverage [email or web address] Password [email or web address] 02/16/2018, 3:04 AM

## 2018-02-16 NOTE — Evaluation (Signed)
Clinical/Bedside Swallow Evaluation Patient Details  Name: Maxwell Lemen MRN: [id number] Date of Birth: 23-Jul-1940  Today's Date: 02/16/2018 Time: SLP Start Time (ACUTE ONLY): 5035 SLP Stop Time (ACUTE ONLY): 0934 SLP Time Calculation (min) (ACUTE ONLY): 10 min  Past Medical History:  Past Medical History:  Diagnosis Date  . Arthritis   . Diabetes mellitus without complication ([place])   . GERD (gastroesophageal reflux disease)   . Hypercholesterolemia   . Hypertension   . Thyroid disease    Past Surgical History:  Past Surgical History:  Procedure Laterality Date  . HERNIA REPAIR     HPI:  77 y.o. male with medical history significant for HTN, hyperlipidemia, diabetes mellitus, GERD, thryoid disease, presented to [facility] ED early 11/27 with left facial droop, difficulty swallowing, unsteady gait. Per chart, pt has been having difficulty swallowing pills, shuffling gait, and word finding difficulties during conversation for more than 3 weeks. 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 Surrounding hyperintense T2-weighted signal likely indicates a combination of edema and nonenhancing tumor. CXR no acute abnormalities.   Assessment / Plan / Recommendation Clinical Impression  Pt presents with left CN VII asymmetry, consistent cough response after drinking thin liquids, and acute c/o change in swallow function.  Recommend proceeding with MBS today to ascertain presence/degree of dysphagia given new neuro findings.  Pt agrees.  Scheduled for 1200 today.  SLP Visit Diagnosis: Dysphagia, unspecified (R13.10)    Aspiration Risk       Diet Recommendation   hold tray pending test today  Medication Administration: Whole meds with puree    Other  Recommendations Oral Care Recommendations: Oral care BID   Follow up Recommendations        Frequency and Duration             Prognosis        Swallow Study   General HPI: 77 y.o. male with medical history significant for HTN, hyperlipidemia, diabetes mellitus, GERD, thryoid disease, presented to [facility] ED early 11/27 with left facial droop, difficulty swallowing, unsteady gait. Per chart, pt has been having difficulty swallowing pills, shuffling gait, and word finding difficulties during conversation for more than 3 weeks. 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 Surrounding hyperintense T2-weighted signal likely indicates a combination of edema and nonenhancing tumor. CXR no acute abnormalities. Type of Study: Bedside Swallow Evaluation Previous Swallow Assessment: no Diet Prior to this Study: Regular;Thin liquids Temperature Spikes Noted: No Respiratory Status: Room air History of Recent Intubation: No Behavior/Cognition: Alert Oral Cavity Assessment: Within Functional Limits Oral Care Completed by SLP: Recent completion by staff Oral Cavity - Dentition: Adequate natural dentition Vision: Functional for self-feeding Self-Feeding Abilities: Able to feed self Patient Positioning: Upright in bed Baseline Vocal Quality: Hoarse Volitional Cough: Strong Volitional Swallow: Able to elicit    Oral/Motor/Sensory Function Overall Oral Motor/Sensory Function: Moderate impairment Facial ROM: Reduced left Facial Symmetry: Abnormal symmetry left;Suspected CN VII (facial) dysfunction   Ice Chips Ice chips: Within functional limits   Thin Liquid Thin Liquid: Impaired Presentation: Cup Pharyngeal  Phase Impairments: Cough - Immediate    Nectar Thick Nectar Thick Liquid: Not tested   Honey Thick Honey Thick Liquid: Not tested   Puree Puree: Not tested   Solid     Solid: Not tested  Juan Quam Laurice 02/16/2018,10:47 AM   Estill Bamberg L. Tivis Ringer, [place] Office number [phone] Pager [phone]

## 2018-02-16 NOTE — Progress Notes (Signed)
Patient discharged home with family. Discharge instruction given to patient, all questions answered and concerns addressed. Patient verbalized understanding.

## 2018-02-16 NOTE — Progress Notes (Signed)
Modified Barium Swallow Progress Note  Patient Details  Name: Stepehn Eckard MRN: [id number] Date of Birth: 1940-10-12  Today's Date: 02/16/2018  Modified Barium Swallow completed.  Full report located under Chart Review in the Imaging Section.  Brief recommendations include the following:  Clinical Impression  Pt presents with a functional swallow with adequate mastication of solids, adequate propulsion into pharynx.  There is delayed onset of the pharyngeal swallow, with materials reaching the pyriforms, and on some occasions residing there momentarily prior to swallow trigger.  Once swallow is triggered, there is generally adequate laryngeal vestibular closure with no aspiration.  There was brief penetration (PAS of 2, considered normal) on one occasion.  A 13 mm barium pill was swallowed without difficulty.  Recommend continuing current diet, thin liquids.  Video was observed/discussed in real time with pt and his wife.  No f/u for swallowing is warranted.  Pt would benefit from a speech/language evaluation given cognitive/language changes, however.    Swallow Evaluation Recommendations       SLP Diet Recommendations: Regular solids;Thin liquid   Liquid Administration via: Cup;Straw   Medication Administration: Whole meds with liquid   Supervision: Patient able to self feed           Oral Care Recommendations: Oral care BID        Juan Quam Laurice 02/16/2018,12:56 PM

## 2018-02-16 NOTE — Progress Notes (Signed)
PROGRESS NOTE    Bryan [name]  BTD:[id number] DOB: [birth year] DOA: 02/15/2018 PCP: Reynold Bowen, MD    Brief Narrative:  77 year old male who presented with left facial droop, difficulty swallowing and unsteady gait.  He does have significant past medical history for hypertension, dyslipidemia, type 2 diabetes mellitus and GERD.  Reported persistent and  worsening symptoms for about 3 weeks, consistent with difficulty swallowing and shuffling gait.  On the day of admission he was noted to have a left facial droop.  On his initial physical examination blood pressure 146/61, heart rate 80, respiratory rate 16, temperature 98.2, oxygen saturation 97%, lungs clear to auscultation bilaterally, heart S1-S2 present rhythmic, abdomen soft nontender, no lower extremity edema, patient was somnolent, left facial droop.  Sodium 137, potassium 4.1, chloride 106, bicarb 23, glucose 150, BUN 20, creatinine 1.0, white count 6.9, hemoglobin 12.9, hematocrit 38.9, platelets 234.  Head CT with 3.9 x 3.3 x 2.9 cm cystic and solid mass within the right hemisphere with localized mass-effect and approximately 5 mm midline shift to the left.  Moderate edema surrounding the mass lesion.  CT chest and abdomen with no obvious malignancy.  +14 x 9 mm stone within the urinary bladder.  EKG normal sinus rhythm, normal axis normal intervals.  Patient was admitted to the hospital with a working diagnosis of focal neurologic deficit due to brain mass.  Assessment & Plan:   Principal Problem:   Brain mass Active Problems:   Diabetes mellitus without complication (HCC)   Hypercholesterolemia   Hypertension  1. 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 Patient continue to have left facial droop, no headache, nausea, vomiting or double vision. Continue systemic steroids with dexamethasone, continue neuro checks and follow with neurology  recommendations.   2. HTN. Uncontrolled blood pressure, will resume lisinopril, continue pressure monitoring.  3. Dyslipidemia. Continue atorvastatin.   4. Dyspepsia. Continue famotidine, will change to daily.    DVT prophylaxis: scd  Code Status: full Family Communication: I spoke with patient's wife at the bedside and all questions were addressed.  Disposition Plan/ discharge barriers: pending final neurosurgery recommendations.   Body mass index is 29.17 kg/m. Malnutrition Type:      Malnutrition Characteristics:      Nutrition Interventions:     RN Pressure Injury Documentation:     Consultants:   Neurosurgery   Procedures:     Antimicrobials:       Subjective: Patient continue to have left facial droop, no nausea or vomiting, no chest pain or dyspnea. Has been started with systemic steroids.   Objective: Vitals:   02/16/18 0330 02/16/18 0355 02/16/18 0555 02/16/18 0726  BP: 125/72 (!) 146/68 135/78 (!) 161/80  Pulse: 75 76 63 (!) 58  Resp:  18 18 18   Temp:  97.9 F (36.6 C) 97.9 F (36.6 C) 97.9 F (36.6 C)  TempSrc:  Oral Oral Oral  SpO2: 97% 96% 94% 100%  Weight:  79.5 kg    Height:  5\' 5"  (1.651 m)      Intake/Output Summary (Last 24 hours) at 02/16/2018 1239 Last data filed at 02/16/2018 1062 Gross per 24 hour  Intake 265 ml  Output -  Net 265 ml   Filed Weights   02/15/18 2218 02/16/18 0355  Weight: 79.8 kg 79.5 kg    Examination:   General: deconditioned  Neurology: Awake and alert, left facial droop.  E ENT: no pallor, no  icterus, oral mucosa moist/ Cardiovascular: No JVD. S1-S2 present, rhythmic, no gallops, rubs, or murmurs. No lower extremity edema. Pulmonary: positive breath sounds bilaterally, adequate air movement, no wheezing, rhonchi or rales. Gastrointestinal. Abdomen with no organomegaly, non tender, no rebound or guarding Skin. No rashes Musculoskeletal: no joint deformities     Data Reviewed: I have  personally reviewed following labs and imaging studies  CBC: Recent Labs  Lab 02/15/18 2226 02/16/18 0546  WBC 6.9 6.4  HGB 12.9* 11.7*  HCT 38.9* 36.1*  MCV 93.7 92.3  PLT 234 591   Basic Metabolic Panel: Recent Labs  Lab 02/15/18 2226 02/16/18 0546  NA 137 136  K 4.1 4.4  CL 106 107  CO2 23 23  GLUCOSE 150* 192*  BUN 20 19  CREATININE 1.04 1.11  CALCIUM 9.4 9.0   GFR: Estimated Creatinine Clearance: 55 mL/min (by C-G formula based on SCr of 1.11 mg/dL). Liver Function Tests: No results for input(s): AST, ALT, ALKPHOS, BILITOT, PROT, ALBUMIN in the last 168 hours. No results for input(s): LIPASE, AMYLASE in the last 168 hours. No results for input(s): AMMONIA in the last 168 hours. Coagulation Profile: Recent Labs  Lab 02/16/18 0546  INR 1.12   Cardiac Enzymes: No results for input(s): CKTOTAL, CKMB, CKMBINDEX, TROPONINI in the last 168 hours. BNP (last 3 results) No results for input(s): PROBNP in the last 8760 hours. HbA1C: Recent Labs    02/16/18 0546  HGBA1C 6.4*   CBG: Recent Labs  Lab 02/16/18 0602  GLUCAP 173*   Lipid Profile: No results for input(s): CHOL, HDL, LDLCALC, TRIG, CHOLHDL, LDLDIRECT in the last 72 hours. Thyroid Function Tests: No results for input(s): TSH, T4TOTAL, FREET4, T3FREE, THYROIDAB in the last 72 hours. Anemia Panel: No results for input(s): VITAMINB12, FOLATE, FERRITIN, TIBC, IRON, RETICCTPCT in the last 72 hours.    Radiology Studies: I have reviewed all of the imaging during this hospital visit personally     Scheduled Meds: . dexamethasone  4 mg Oral Q6H  . heparin  5,000 Units Subcutaneous Q8H  . [START ON 02/17/2018] pneumococcal 23 valent vaccine  0.5 mL Intramuscular Tomorrow-1000   Continuous Infusions: . sodium chloride 100 mL/hr at 02/16/18 0620     LOS: 0 days        Carlissa Pesola Gerome Apley, MD [facility] Pager [phone]

## 2018-02-16 NOTE — Care Management Note (Signed)
Case Management Note  Patient Details  Name: Bryan [name] MRN: [id number] Date of Birth: July 18, 1940  Subjective/Objective:   Pt admitted with a brain mass. He is from home with his spouse.  DME: cane No issues with transportation or obtaining his medications.                 Action/Plan: Pt discharging home with self care. Pt has supervision at home and transportation to home.   Expected Discharge Date:  02/16/18               Expected Discharge Plan:  Home/Self Care  In-House Referral:     Discharge planning Services     Post Acute Care Choice:    Choice offered to:     DME Arranged:    DME Agency:     HH Arranged:    HH Agency:     Status of Service:  Completed, signed off  If discussed at [employer] of Stay Meetings, dates discussed:    Additional Comments:  Pollie Friar, RN 02/16/2018, 4:18 PM

## 2018-02-16 NOTE — Care Management CC44 (Signed)
Condition Code 44 Documentation Completed  Patient Details  Name: Bryan [name] MRN: [id number] Date of Birth: 03-11-1941   Condition Code 44 given:  Yes Patient signature on Condition Code 44 notice:  Yes Documentation of 2 MD's agreement:  Yes Code 44 added to claim:  Yes    Pollie Friar, RN 02/16/2018, 4:15 PM

## 2018-02-16 NOTE — Evaluation (Signed)
Physical Therapy Evaluation Patient Details Name: Bryan [name] MRN: [id number] DOB: 1940/05/24 Today's Date: 02/16/2018   History of Present Illness  pt is a 77 y/o male who presented with left facial droop, dirriculty swallowing and an unsteady gait.  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  Pt to d/c and follow up for outpatient mangement next week.  PMH HTN, DM2  Clinical Impression  Pt is close to baseline functioning and should be safe at home with family assist. There are no further acute PT needs at this time.   Will sign off at this time.     Follow Up Recommendations No PT follow up    Equipment Recommendations  None recommended by PT    Recommendations for Other Services       Precautions / Restrictions Precautions Precautions: (mild risk.  pt off balance, but good righting reactions)      Mobility  Bed Mobility Overal bed mobility: Modified Independent                Transfers Overall transfer level: Modified independent                  Ambulation/Gait Ambulation/Gait assistance: Independent Gait Distance (Feet): 200 Feet Assistive device: None Gait Pattern/deviations: Step-through pattern Gait velocity: moderate Gait velocity interpretation: >2.62 ft/sec, indicative of community ambulatory General Gait Details: Episodes of mild unsteadiness and deviation for which pt reacts to maintain balance.  Stairs Stairs: Yes   Stair Management: One rail Right;Alternating pattern;Forwards Number of Stairs: 12 General stair comments: Initially stumped toes on the left until he could compensate.  We discussed continuing to use the rail for that reason and more.  Wheelchair Mobility    Modified Rankin (Stroke Patients Only)       Balance Overall balance assessment: Independent;Mild deficits observed, not formally tested                                           Pertinent Vitals/Pain Pain Assessment: No/denies pain     Home Living Family/patient expects to be discharged to:: Private residence Living Arrangements: Spouse/significant other Available Help at Discharge: Available 24 hours/day Type of Home: House Home Access: Stairs to enter   Technical brewer of Steps: 3 Home Layout: One level Home Equipment: None      Prior Function Level of Independence: Independent               Hand Dominance        Extremity/Trunk Assessment   Upper Extremity Assessment Upper Extremity Assessment: Overall WFL for tasks assessed    Lower Extremity Assessment Lower Extremity Assessment: Overall WFL for tasks assessed(clumbsinees L LE)       Communication   Communication: No difficulties  Cognition Arousal/Alertness: Awake/alert Behavior During Therapy: WFL for tasks assessed/performed;Impulsive Overall Cognitive Status: Within Functional Limits for tasks assessed                                        General Comments General comments (skin integrity, edema, etc.): pt donned all his clothes, mostly in standing, packed and walked around in the room getting ready to discharge.  Mild deviation, no LOB, safe reaction to deviation.    Exercises     Assessment/Plan    PT Assessment Patent does not  need any further PT services  PT Problem List         PT Treatment Interventions      PT Goals (Current goals can be found in the Care Plan section)  Acute Rehab PT Goals PT Goal Formulation: All assessment and education complete, DC therapy    Frequency     Barriers to discharge        Co-evaluation               AM-PAC PT "6 Clicks" Mobility  Outcome Measure Help needed turning from your back to your side while in a flat bed without using bedrails?: None Help needed moving from lying on your back to sitting on the side of a flat bed without using bedrails?: None Help needed moving to and from a bed to a chair (including a wheelchair)?: None Help needed  standing up from a chair using your arms (e.g., wheelchair or bedside chair)?: None Help needed to walk in hospital room?: None Help needed climbing 3-5 steps with a railing? : None 6 Click Score: 24    End of Session   Activity Tolerance: Patient tolerated treatment well Patient left: Other (comment)(at bedside finishing packing) Nurse Communication: Mobility status PT Visit Diagnosis: Unsteadiness on feet (R26.81)    Time: 5379-4327 PT Time Calculation (min) (ACUTE ONLY): 20 min   Charges:   PT Evaluation $PT Eval Low Complexity: 1 Low          02/16/2018  Donnella Sham, PT Acute Rehabilitation Services [phone]  (pager) [phone]  (office)  Tessie Fass Pressley Tadesse 02/16/2018, 4:51 PM

## 2018-02-16 NOTE — ED Provider Notes (Signed)
[facility] EMERGENCY DEPARTMENT Provider Note   CSN: [id number] Arrival date & time: 02/15/18  2208     History   Chief Complaint Chief Complaint  Patient presents with  . Cerebrovascular Accident    HPI Bryan [name] is a 77 y.o. male.   77 year old male with history of diabetes, hypertension, dyslipidemia, esophageal reflux presents to the emergency department for evaluation of left-sided facial drooping.  Facial drooping noted by family around noontime today.  His symptoms have been preceded by a worsening, persistent shuffling-type gait. He has also had progression of fatigue, sleeping through the day for longer periods.  He was seen by his PCP ~1.5 weeks ago after he was having trouble swallowing his fish oil pills.  Patient subjectively felt he was producing more phlegm making it difficult for him to tolerate his medications.  Was given Rx for an antihistamine, though these symptoms have persisted as well.  Now needs a half a glass of water to successfully swallow his medications during the day.  Denies problems eating solid foods, however.  No issues drinking liquids.  Further denies fever, vision changes, vision loss, hearing changes, extremity numbness or paresthesias, extremity weakness.  He is not on chronic anticoagulation.  No prior hx of cancer.     Past Medical History:  Diagnosis Date  . Arthritis   . Diabetes mellitus without complication ([place])   . GERD (gastroesophageal reflux disease)   . Hypercholesterolemia   . Hypertension   . Thyroid disease     Patient Active Problem List   Diagnosis Date Noted  . Brain mass 02/16/2018  . Diabetes mellitus without complication ([place])   . Hypercholesterolemia   . Hypertension     ** The histories are not reviewed yet. Please review them in the "History" navigator section and refresh this [place].    Home Medications    Prior to Admission medications   Medication Sig Start Date End Date  Taking? Authorizing Provider  atorvastatin (LIPITOR) 40 MG tablet Take 40 mg by mouth daily.   Yes [provider]  lisinopril (PRINIVIL,ZESTRIL) 10 MG tablet Take 10 mg by mouth daily.   Yes [provider]  metFORMIN (GLUCOPHAGE) 500 MG tablet Take 1,000 mg by mouth 2 (two) times daily with a meal.   Yes [provider]    Family History No family history on file.  Social History Social History   Tobacco Use  . Smoking status: Former Research scientist (life sciences)  . Smokeless tobacco: Never Used  Substance Use Topics  . Alcohol use: Yes    Alcohol/week: 1.0 standard drinks    Types: 1 Glasses of wine per week    Comment: weekly  . Drug use: Never     Allergies   Patient has no known allergies.   Review of Systems Review of Systems Ten systems reviewed and are negative for acute change, except as noted in the HPI.    Physical Exam Updated Vital Signs BP 118/82   Pulse 79   Temp 98.2 F (36.8 C) (Oral)   Resp 17   Ht 5\' 5"  (1.651 m)   Wt 79.8 kg   SpO2 97%   BMI 29.29 kg/m   Physical Exam  Constitutional: He is oriented to person, place, and time. He appears well-developed and well-nourished. No distress.  Nontoxic appearing and in NAD  HENT:  Head: Normocephalic and atraumatic.  Tolerating secretions. Uvula midline.  Eyes: Conjunctivae are normal. No scleral icterus.  Leftward nystagmus  Neck: Normal  range of motion.  Pulmonary/Chest: Effort normal. No stridor. No respiratory distress.  Respirations even and unlabored  Musculoskeletal: Normal range of motion.  Neurological: He is alert and oriented to person, place, and time. He exhibits normal muscle tone.  GCS 15. Speech is goal oriented. Facial drooping noted to the L corner of the mouth. Patient has equal grip strength bilaterally with 5/5 strength against resistance in all major muscle groups of the RUE, RLE, LLE; 4+/5 noted in the LUE. Sensation to light touch intact. No significant dysmetria to  finger-nose-finger.  Patient ambulatory with steady gait.  Skin: Skin is warm and dry. No rash noted. He is not diaphoretic. No erythema. No pallor.  Psychiatric: He has a normal mood and affect. His behavior is normal.  Nursing note and vitals reviewed.    ED Treatments / Results  Labs (all labs ordered are listed, but only abnormal results are displayed) Labs Reviewed  CBC - Abnormal; Notable for the following components:      Result Value   RBC 4.15 (*)    Hemoglobin 12.9 (*)    HCT 38.9 (*)    All other components within normal limits  BASIC METABOLIC PANEL - Abnormal; Notable for the following components:   Glucose, Bld 150 (*)    All other components within normal limits    EKG EKG Interpretation  Date/Time:  Tuesday February 15 2018 22:21:45 EST Ventricular Rate:  77 PR Interval:  164 QRS Duration: 78 QT Interval:  358 QTC Calculation: 405 R Axis:   115 Text Interpretation:  Normal sinus rhythm Nonspecific T wave abnormality No previous tracing Confirmed by Lajean Saver [phone]) on 02/15/2018 11:17:22 PM   Radiology Ct Head Wo Contrast  Result Date: 02/15/2018 CLINICAL DATA:  Difficulty speaking and walking EXAM: CT HEAD WITHOUT CONTRAST TECHNIQUE: Contiguous axial images were obtained from the base of the skull through the vertex without intravenous contrast. COMPARISON:  None. FINDINGS: Brain: No hemorrhage or acute territorial infarct. Cystic and solid mass within the right basal ganglial region measuring 3.9 x 3.3 by 2.9 cm. Localized mass effect with compression of the right lateral ventricle. Approximately 5 mm midline shift to the left. Hypodense edema surrounding the mass. Edema within the right temporal. Mild ventricular enlargement. Vascular: No hyperdense vessels.  Carotid vascular calcification. Skull: Normal. Negative for fracture or focal lesion. Sinuses/Orbits: Complete opacification of left maxillary sinus. Mucosal thickening in the left ethmoid sinus.  Other: None IMPRESSION: 3.9 x 3.3 x 2.9 cm cystic and solid mass within the right hemisphere with localized mass effect and approximately 5 mm midline shift to the left. Moderate edema surrounding the mass lesion. Findings could be secondary to metastatic disease, primary brain mass, or possibly infection. Contrast-enhanced MRI is suggested for further evaluation. Electronically Signed   By: Donavan Foil M.D.   On: 02/15/2018 23:30    Procedures Procedures (including critical care time)  Medications Ordered in ED Medications  dexamethasone (DECADRON) injection 4 mg (has no administration in time range)     12:47 AM Repeat page placed to neurosurgery.  1:15 AM Case discussed with [place], PA-C of neurosurgical service.  He recommends hospitalist admission for metastatic work-up.  Neurosurgery to consult in the morning.  Agree with initiation of Decadron.  Family updated.   Initial Impression / Assessment and Plan / ED Course  I have reviewed the triage vital signs and the nursing notes.  Pertinent labs & imaging results that were available during my care of the patient were  reviewed by me and considered in my medical decision making (see chart for details).     77 year old male presents for left-sided facial drooping which was initially noted at noon yesterday.  He has a CT scan concerning for primary versus secondary mass.  There is associated midline shift as well as surrounding edema.  The patient will be admitted for metastatic work-up as well as initiation of IV steroids.  Dr. Blaine Hamper of [facility] to admit; neurosurgical service to follow in consultation.   Vitals:   02/15/18 2216 02/15/18 2218 02/16/18 0030  BP: (!) 146/61  118/82  Pulse: 80  79  Resp: 16  17  Temp: 98.2 F (36.8 C)    TempSrc: Oral    SpO2: 97%  97%  Weight:  79.8 kg   Height:  5\' 5"  (1.651 m)     Final Clinical Impressions(s) / ED Diagnoses   Final diagnoses:  Mass of brain    ED Discharge Orders     None       Antonietta Breach, PA-C 02/16/18 [place], [place], MD 02/16/18 [phone]

## 2018-02-16 NOTE — ED Notes (Signed)
Delay in lab draw,  Pt not in room 

## 2018-02-16 NOTE — Plan of Care (Signed)
  Problem: Education: Goal: Knowledge of General Education information will improve Description Including pain rating scale, medication(s)/side effects and non-pharmacologic comfort measures Outcome: Adequate for Discharge   Problem: Health Behavior/Discharge Planning: Goal: Ability to manage health-related needs will improve Outcome: Adequate for Discharge   Problem: Clinical Measurements: Goal: Ability to maintain clinical measurements within normal limits will improve Outcome: Adequate for Discharge   Problem: Activity: Goal: Risk for activity intolerance will decrease Outcome: Adequate for Discharge   Problem: Coping: Goal: Level of anxiety will decrease Outcome: Adequate for Discharge   Problem: Pain Managment: Goal: General experience of comfort will improve Outcome: Adequate for Discharge   Problem: Safety: Goal: Ability to remain free from injury will improve Outcome: Adequate for Discharge   Problem: Skin Integrity: Goal: Risk for impaired skin integrity will decrease Outcome: Adequate for Discharge   Problem: Education: Goal: Knowledge of disease or condition will improve Outcome: Adequate for Discharge Goal: Knowledge of secondary prevention will improve Outcome: Adequate for Discharge Goal: Knowledge of patient specific risk factors addressed and post discharge goals established will improve Outcome: Adequate for Discharge   Problem: Coping: Goal: Will verbalize positive feelings about self Outcome: Adequate for Discharge   Problem: Self-Care: Goal: Ability to participate in self-care as condition permits will improve Outcome: Adequate for Discharge   Problem: Nutrition: Goal: Risk of aspiration will decrease Outcome: Adequate for Discharge   Problem: Ischemic Stroke/TIA Tissue Perfusion: Goal: Complications of ischemic stroke/TIA will be minimized Outcome: Adequate for Discharge

## 2018-02-16 NOTE — Care Management Obs Status (Signed)
[place] NOTIFICATION   Patient Details  Name: Bryan [name] MRN: [id number] Date of Birth: 09-23-1940   Medicare Observation Status Notification Given:  Yes    Pollie Friar, RN 02/16/2018, 4:15 PM

## 2018-02-16 NOTE — Consult Note (Addendum)
Chief Complaint   Chief Complaint  Patient presents with  . Cerebrovascular Accident    HPI   Consult requested by: Antonietta Breach, PA-C Reason for consult: Brain mass  HPI: Bryan [name] is a 77 y.o. male with history of DM, hyperlipidemia and HTN who presented to ER due to acute onset left facial droop. History actually begins several weeks ago when he developed difficulties swallowing his pills. He usually takes his medications without water, but due to sinus congestion/phlegm, he had to drink half a glass of water with his meds. Because of the sinus congestion, he was taking Nyquil. Around this time, he noticed difficulties with ambulation due to his left leg "dragging".  He thought it was due to Nyquil. Yesterday he was at his daughters for dinner and developed slurring speech and left facial droop so they called EMS.  For the past several weeks, he has noticed difficulties with what sounds like expressive aphasia, for example, he would trip over his words and have difficulties with word finding during normal conversation. He has also noticed being off balance for the past couple days and needing to lean up against a wall to put his pants on. He has had some difficulties with navigating around the area, but just relocated here and is not sure that is related.  He has a history of skin cancer, but no other cancers. He is not on any blood thinning agents.    Patient Active Problem List   Diagnosis Date Noted  . Brain mass 02/16/2018  . Diabetes mellitus without complication ([place])   . Hypercholesterolemia   . Hypertension     PMH: Past Medical History:  Diagnosis Date  . Arthritis   . Diabetes mellitus without complication ([address])   . GERD (gastroesophageal reflux disease)   . Hypercholesterolemia   . Hypertension   . Thyroid disease     PSH: Past Surgical History:  Procedure Laterality Date  . HERNIA REPAIR      Medications Prior to Admission  Medication Sig Dispense  Refill Last Dose  . atorvastatin (LIPITOR) 40 MG tablet Take 40 mg by mouth daily.   02/15/2018 at Unknown time  . lisinopril (PRINIVIL,ZESTRIL) 10 MG tablet Take 10 mg by mouth daily.   02/15/2018 at Unknown time  . metFORMIN (GLUCOPHAGE) 500 MG tablet Take 1,000 mg by mouth 2 (two) times daily with a meal.   02/15/2018 at am    SH: Social History   Tobacco Use  . Smoking status: Former Research scientist (life sciences)  . Smokeless tobacco: Never Used  Substance Use Topics  . Alcohol use: Yes    Alcohol/week: 1.0 standard drinks    Types: 1 Glasses of wine per week    Comment: weekly  . Drug use: Never    MEDS: Prior to Admission medications   Medication Sig Start Date End Date Taking? Authorizing Provider  atorvastatin (LIPITOR) 40 MG tablet Take 40 mg by mouth daily.   Yes [provider]  lisinopril (PRINIVIL,ZESTRIL) 10 MG tablet Take 10 mg by mouth daily.   Yes [provider]  metFORMIN (GLUCOPHAGE) 500 MG tablet Take 1,000 mg by mouth 2 (two) times daily with a meal.   Yes [provider]    ALLERGY: No Known Allergies  Social History   Tobacco Use  . Smoking status: Former Research scientist (life sciences)  . Smokeless tobacco: Never Used  Substance Use Topics  . Alcohol use: Yes    Alcohol/week: 1.0 standard drinks    Types: 1 Glasses  of wine per week    Comment: weekly     Family History  Problem Relation Age of Onset  . Diabetes Mellitus II Mother   . ALS Father      ROS   Review of Systems  Constitutional: Positive for malaise/fatigue.  HENT: Negative.   Eyes: Negative for blurred vision, double vision and photophobia.  Respiratory: Positive for sputum production. Negative for shortness of breath.   Cardiovascular: Negative.   Gastrointestinal: Negative for nausea and vomiting.  Genitourinary: Negative.   Musculoskeletal: Negative for back pain, falls, joint pain, myalgias and neck pain.       + gait instability  Neurological: Positive for speech change. Negative for  dizziness, tingling, tremors, sensory change, focal weakness, seizures, loss of consciousness, weakness and headaches.    Exam   Vitals:   02/16/18 0555 02/16/18 0726  BP: 135/78 (!) 161/80  Pulse: 63 (!) 58  Resp: 18 18  Temp: 97.9 F (36.6 C) 97.9 F (36.6 C)  SpO2: 94% 100%   General appearance: elderly male, resting comfortably Eyes: No scleral injection Cardiovascular: Regular rate and rhythm without murmurs, rubs, gallops. No edema or variciosities. Distal pulses normal. Pulmonary: Effort normal, non-labored breathing Musculoskeletal:     Muscle tone upper extremities: Normal    Muscle tone lower extremities: Normal    Motor exam: Upper Extremities Deltoid Bicep Tricep Grip  Right 5/5 5/5 5/5 5/5  Left 5/5 5/5 5/5 5/5   Lower Extremity IP Quad PF DF EHL  Right 5/5 5/5 5/5 5/5 5/5  Left 5/5 5/5 5/5 5/5 5/5   Neurological Mental Status:    - Patient is awake, alert, oriented to person, place, month, year, and situation    - Mild expressive aphasia Cranial Nerves    - II: Visual Fields are full. PERRL    - III/IV/VI: EOMI without ptosis or diploplia.     - V: Facial sensation is grossly normal on right, slightly decreased on left    - VII: left facial droop    - VIII: hearing is intact to voice    - X: Uvula elevates symmetrically    - XI: Shoulder shrug is symmetric.    - XII: tongue is without atrophy or fasciculations.  Sensory: Sensation grossly intact to LT Deep Tendon Reflexes    - 2+ and symmetric in the biceps and patellae. Plantars   - Toes are downgoing bilaterally.  Cerebellar    - FNF and HKS are intact bilaterally although slight worsened on left Mild left pronator drift  Results - Imaging/Labs   Results for orders placed or performed during the hospital encounter of 02/15/18 (from the past 48 hour(s))  CBC     Status: Abnormal   Collection Time: 02/15/18 10:26 PM  Result Value Ref Range   WBC 6.9 4.0 - 10.5 K/uL   RBC 4.15 (L) 4.22 - 5.81  MIL/uL   Hemoglobin 12.9 (L) 13.0 - 17.0 g/dL   HCT 38.9 (L) 39.0 - 52.0 %   MCV 93.7 80.0 - 100.0 fL   MCH 31.1 26.0 - 34.0 pg   MCHC 33.2 30.0 - 36.0 g/dL   RDW 12.3 11.5 - 15.5 %   Platelets 234 150 - 400 K/uL   nRBC 0.0 0.0 - 0.2 %    Comment: Performed at [facility], [place] [address] [place], [place] [address]  Basic metabolic panel     Status: Abnormal   Collection Time: 02/15/18 10:26 PM  Result Value  Ref Range   Sodium 137 135 - 145 mmol/L   Potassium 4.1 3.5 - 5.1 mmol/L   Chloride 106 98 - 111 mmol/L   CO2 23 22 - 32 mmol/L   Glucose, Bld 150 (H) 70 - 99 mg/dL   BUN 20 8 - 23 mg/dL   Creatinine, Ser 1.04 0.61 - 1.24 mg/dL   Calcium 9.4 8.9 - 10.3 mg/dL   GFR calc non Af Amer >60 >60 mL/min   GFR calc Af Amer >60 >60 mL/min   Anion gap 8 5 - 15    Comment: Performed at [facility] [address], [place], [place] [address]  PSA     Status: None   Collection Time: 02/16/18  5:46 AM  Result Value Ref Range   Prostatic Specific Antigen 2.72 0.00 - 4.00 ng/mL    Comment: (NOTE) While PSA levels of <=4.0 ng/ml are reported as reference range, some men with levels below 4.0 ng/ml can have prostate cancer and many men with PSA above 4.0 ng/ml do not have prostate cancer.  Other tests such as free PSA, age specific reference ranges, PSA velocity and PSA doubling time may be helpful especially in men less than 92 years old. Performed at [facility], [place] [address] [place], [place] [address]   Basic metabolic panel     Status: Abnormal   Collection Time: 02/16/18  5:46 AM  Result Value Ref Range   Sodium 136 135 - 145 mmol/L   Potassium 4.4 3.5 - 5.1 mmol/L   Chloride 107 98 - 111 mmol/L   CO2 23 22 - 32 mmol/L   Glucose, Bld 192 (H) 70 - 99 mg/dL   BUN 19 8 - 23 mg/dL   Creatinine, Ser 1.11 0.61 - 1.24 mg/dL   Calcium 9.0 8.9 - 10.3 mg/dL   GFR calc non Af Amer >60 >60 mL/min   GFR calc Af Amer >60 >60 mL/min   Anion gap 6 5 - 15    Comment:  Performed at [facility] [address] [place], [place] [address]  CBC     Status: Abnormal   Collection Time: 02/16/18  5:46 AM  Result Value Ref Range   WBC 6.4 4.0 - 10.5 K/uL   RBC 3.91 (L) 4.22 - 5.81 MIL/uL   Hemoglobin 11.7 (L) 13.0 - 17.0 g/dL   HCT 36.1 (L) 39.0 - 52.0 %   MCV 92.3 80.0 - 100.0 fL   MCH 29.9 26.0 - 34.0 pg   MCHC 32.4 30.0 - 36.0 g/dL   RDW 12.4 11.5 - 15.5 %   Platelets 211 150 - 400 K/uL   nRBC 0.0 0.0 - 0.2 %    Comment: Performed at [facility], [place] [address] [place], [place] [address]  Protime-INR     Status: None   Collection Time: 02/16/18  5:46 AM  Result Value Ref Range   Prothrombin Time 14.3 11.4 - 15.2 seconds   INR 1.12     Comment: Performed at [facility] [address], [place], [place] [address]  APTT     Status: None   Collection Time: 02/16/18  5:46 AM  Result Value Ref Range   aPTT 29 24 - 36 seconds    Comment: Performed at [facility] [address], [place],  [address]  Hemoglobin A1c     Status: Abnormal   Collection Time: 02/16/18  5:46 AM  Result Value  Ref Range   Hgb A1c MFr Bld 6.4 (H) 4.8 - 5.6 %    Comment: (NOTE) Pre diabetes:          5.7%-6.4% Diabetes:              >6.4% Glycemic control for   <7.0% adults with diabetes    Mean Plasma Glucose 136.98 mg/dL    Comment: Performed at [facility] [address] [place], [place] [address]  Glucose, capillary     Status: Abnormal   Collection Time: 02/16/18  6:02 AM  Result Value Ref Range   Glucose-Capillary 173 (H) 70 - 99 mg/dL   Comment 1 Notify RN    Comment 2 Document in Chart     Ct Head Wo Contrast  Result Date: 02/15/2018 CLINICAL DATA:  Difficulty speaking and walking EXAM: CT HEAD WITHOUT CONTRAST TECHNIQUE: Contiguous axial images were obtained from the base of the skull through the vertex without intravenous contrast. COMPARISON:  None. FINDINGS: Brain: No hemorrhage or acute territorial infarct. Cystic  and solid mass within the right basal ganglial region measuring 3.9 x 3.3 by 2.9 cm. Localized mass effect with compression of the right lateral ventricle. Approximately 5 mm midline shift to the left. Hypodense edema surrounding the mass. Edema within the right temporal. Mild ventricular enlargement. Vascular: No hyperdense vessels.  Carotid vascular calcification. Skull: Normal. Negative for fracture or focal lesion. Sinuses/Orbits: Complete opacification of left maxillary sinus. Mucosal thickening in the left ethmoid sinus. Other: None IMPRESSION: 3.9 x 3.3 x 2.9 cm cystic and solid mass within the right hemisphere with localized mass effect and approximately 5 mm midline shift to the left. Moderate edema surrounding the mass lesion. Findings could be secondary to metastatic disease, primary brain mass, or possibly infection. Contrast-enhanced MRI is suggested for further evaluation. Electronically Signed   By: Donavan Foil M.D.   On: 02/15/2018 23:30   Ct Chest W Contrast  Result Date: 02/16/2018 CLINICAL DATA:  Shortness of breath. Left facial droop. Brain mass. Assessment for malignancy within the chest, abdomen or pelvis. EXAM: CT CHEST, ABDOMEN, AND PELVIS WITH CONTRAST TECHNIQUE: Multidetector CT imaging of the chest, abdomen and pelvis was performed following the standard protocol during bolus administration of intravenous contrast. CONTRAST:  146mL OMNIPAQUE IOHEXOL 300 MG/ML  SOLN COMPARISON:  None. FINDINGS: CT CHEST FINDINGS Cardiovascular: There is calcific aortic atherosclerosis. Heart size is normal. Mediastinum/Nodes: No mediastinal, hilar or axillary lymphadenopathy. The visualized thyroid and thoracic esophageal course are unremarkable. Lungs/Pleura: There is a 4 mm nodule of the left lung base. The lungs are otherwise clear. Musculoskeletal: No chest wall mass or suspicious bone lesions identified. CT ABDOMEN PELVIS FINDINGS HEPATOBILIARY: The hepatic contours and density are normal. There  is no intra- or extrahepatic biliary dilatation. Small cyst in the left hepatic lobe. The gallbladder is normal. PANCREAS: The pancreatic parenchymal contours are normal and there is no ductal dilatation. There is no peripancreatic fluid collection. SPLEEN: Normal. ADRENALS/URINARY TRACT: --Adrenal glands: Normal. --Right kidney/ureter: There is a lower pole cyst measuring 4.0 cm. No hydronephrosis, nephroureterolithiasis, perinephric stranding or solid renal mass. --Left kidney/ureter: No hydronephrosis, nephroureterolithiasis, perinephric stranding or solid renal mass. --Urinary bladder: There is a stone within the dependent portion of the urinary bladder measuring 14 x 9 mm. STOMACH/BOWEL: --Stomach/Duodenum: There is no hiatal hernia or other gastric abnormality. The duodenal course and caliber are normal. --Small bowel: No dilatation or inflammation. --Colon: No focal abnormality. --Appendix: Normal. VASCULAR/LYMPHATIC: Atherosclerotic calcification is present within  the non-aneurysmal abdominal aorta, without hemodynamically significant stenosis. The portal vein, splenic vein, superior mesenteric vein and IVC are patent. No abdominal or pelvic lymphadenopathy. REPRODUCTIVE: Enlarged, heterogeneous prostate gland measures 6.3 x 5.7 x 7.7 cm. The seminal vesicles are normal. MUSCULOSKELETAL. No bony spinal canal stenosis or focal osseous abnormality. OTHER: There is a small, fluid containing left inguinal hernia. Sequelae of prior hernia repair in the anterior left abdomen. IMPRESSION: 1. No acute abnormality of the chest, abdomen or pelvis. 2. 14 x 9 mm stone within the dependent portion of the urinary bladder. 3. Enlarged, heterogeneous prostate gland. Correlate with PSA. 4. 4 mm left lung base pulmonary nodule. No follow-up needed if patient is low-risk. Non-contrast chest CT can be considered in 12 months if patient is high-risk. This recommendation follows the consensus statement: Guidelines for Management  of Incidental Pulmonary Nodules Detected on CT Images: From the Fleischner Society 2017; Radiology 2017; 284:228-243. Aortic Atherosclerosis (ICD10-I70.0). Electronically Signed   By: Ulyses Jarred M.D.   On: 02/16/2018 03:43   Mr Brain W And Wo Contrast  Result Date: 02/16/2018 CLINICAL DATA:  Brain mass.  Facial paralysis. EXAM: MRI HEAD WITHOUT AND WITH CONTRAST TECHNIQUE: Multiplanar, multiecho pulse sequences of the brain and surrounding structures were obtained without and with intravenous contrast. CONTRAST:  7 mL Gadavist COMPARISON:  Head CT 02/15/2018 FINDINGS: BRAIN: There is a mass centered in the right frontal white matter with a large cystic/necrotic component. There is a solid component at the lateral aspect, measuring 2.5 x 1.4 x 2.7 cm. The solid component at the inferior aspect measures 3.7 x 2.3 x 1.1 cm. Including the cystic component, the complex measures 4.0 x 4.6 x 4.0 cm. There is no diffusion restriction within the mass. There is hyperintense T2-weighted signal that extends to the right insula, along the right internal capsule and into the right temporal white matter. There are scattered foci of white matter hyperintensity within both hemispheres otherwise. The cerebral and cerebellar volume are age-appropriate. Susceptibility-sensitive sequences show no chronic microhemorrhage or superficial siderosis. Leftward midline shift measures 4 mm. VASCULAR: Major intracranial arterial and venous sinus flow voids are normal. SKULL AND UPPER CERVICAL SPINE: Calvarial bone marrow signal is normal. There is no skull base mass. Visualized upper cervical spine and soft tissues are normal. SINUSES/ORBITS: There is complete opacification of the left maxillary sinus. The orbits are normal. IMPRESSION: 1. 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 Surrounding hyperintense T2-weighted signal  likely indicates a combination of edema and nonenhancing tumor. There is no diffusion restriction within the necrotic component to suggest abscess. 2. Leftward midline shift measures 4 mm. Electronically Signed   By: Ulyses Jarred M.D.   On: 02/16/2018 03:11   Ct Abdomen Pelvis W Contrast  Result Date: 02/16/2018 CLINICAL DATA:  Shortness of breath. Left facial droop. Brain mass. Assessment for malignancy within the chest, abdomen or pelvis. EXAM: CT CHEST, ABDOMEN, AND PELVIS WITH CONTRAST TECHNIQUE: Multidetector CT imaging of the chest, abdomen and pelvis was performed following the standard protocol during bolus administration of intravenous contrast. CONTRAST:  167mL OMNIPAQUE IOHEXOL 300 MG/ML  SOLN COMPARISON:  None. FINDINGS: CT CHEST FINDINGS Cardiovascular: There is calcific aortic atherosclerosis. Heart size is normal. Mediastinum/Nodes: No mediastinal, hilar or axillary lymphadenopathy. The visualized thyroid and thoracic esophageal course are unremarkable. Lungs/Pleura: There is a 4 mm nodule of the left lung base. The lungs are otherwise  clear. Musculoskeletal: No chest wall mass or suspicious bone lesions identified. CT ABDOMEN PELVIS FINDINGS HEPATOBILIARY: The hepatic contours and density are normal. There is no intra- or extrahepatic biliary dilatation. Small cyst in the left hepatic lobe. The gallbladder is normal. PANCREAS: The pancreatic parenchymal contours are normal and there is no ductal dilatation. There is no peripancreatic fluid collection. SPLEEN: Normal. ADRENALS/URINARY TRACT: --Adrenal glands: Normal. --Right kidney/ureter: There is a lower pole cyst measuring 4.0 cm. No hydronephrosis, nephroureterolithiasis, perinephric stranding or solid renal mass. --Left kidney/ureter: No hydronephrosis, nephroureterolithiasis, perinephric stranding or solid renal mass. --Urinary bladder: There is a stone within the dependent portion of the urinary bladder measuring 14 x 9 mm. STOMACH/BOWEL:  --Stomach/Duodenum: There is no hiatal hernia or other gastric abnormality. The duodenal course and caliber are normal. --Small bowel: No dilatation or inflammation. --Colon: No focal abnormality. --Appendix: Normal. VASCULAR/LYMPHATIC: Atherosclerotic calcification is present within the non-aneurysmal abdominal aorta, without hemodynamically significant stenosis. The portal vein, splenic vein, superior mesenteric vein and IVC are patent. No abdominal or pelvic lymphadenopathy. REPRODUCTIVE: Enlarged, heterogeneous prostate gland measures 6.3 x 5.7 x 7.7 cm. The seminal vesicles are normal. MUSCULOSKELETAL. No bony spinal canal stenosis or focal osseous abnormality. OTHER: There is a small, fluid containing left inguinal hernia. Sequelae of prior hernia repair in the anterior left abdomen. IMPRESSION: 1. No acute abnormality of the chest, abdomen or pelvis. 2. 14 x 9 mm stone within the dependent portion of the urinary bladder. 3. Enlarged, heterogeneous prostate gland. Correlate with PSA. 4. 4 mm left lung base pulmonary nodule. No follow-up needed if patient is low-risk. Non-contrast chest CT can be considered in 12 months if patient is high-risk. This recommendation follows the consensus statement: Guidelines for Management of Incidental Pulmonary Nodules Detected on CT Images: From the Fleischner Society 2017; Radiology 2017; 284:228-243. Aortic Atherosclerosis (ICD10-I70.0). Electronically Signed   By: Ulyses Jarred M.D.   On: 02/16/2018 03:43    Impression/Plan   77 y.o. 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 He has left facial droop and mild expressive aphasia, but otherwise grossly normal neuro exam. CT chest/abd/pelvis negative. Based on size, he will  Potentially need to undergo surgical resection of tumor. He has been started on decadron 4mg  q 6 hours. Dr Kathyrn Sheriff to follow up regarding full treatment plan.

## 2018-02-16 NOTE — Consult Note (Signed)
[facility] CONSULT NOTE  Patient Care Team: Reynold Bowen, MD as PCP - General (Endocrinology)  CHIEF COMPLAINTS/PURPOSE OF CONSULTATION:  Facial Droop Brain Tumor  HISTORY OF PRESENTING ILLNESS:  Bryan [name] 77 y.o. male presented after family noted drooping of the left side of his face yesterday.  In addition, he/they describe some cognitive changes, such as difficulty "finding words" in certain situations.  The patient also feels like his left leg is "lagging" a little behind his right lately.  He is walking on his own, however, and with no falls.  Denies headaches, seizures.    MEDICAL HISTORY:  Past Medical History:  Diagnosis Date  . Arthritis   . Diabetes mellitus without complication ([facility])   . GERD (gastroesophageal reflux disease)   . Hypercholesterolemia   . Hypertension   . Thyroid disease     SURGICAL HISTORY: Past Surgical History:  Procedure Laterality Date  . HERNIA REPAIR      SOCIAL HISTORY: Social History   Socioeconomic History  . Marital status: Married    Spouse name: Not on file  . Number of children: Not on file  . Years of education: Not on file  . Highest education level: Not on file  Occupational History  . Not on file  Social Needs  . Financial resource strain: Not on file  . Food insecurity:    Worry: Not on file    Inability: Not on file  . Transportation needs:    Medical: Not on file    Non-medical: Not on file  Tobacco Use  . Smoking status: Former Research scientist (life sciences)  . Smokeless tobacco: Never Used  Substance and Sexual Activity  . Alcohol use: Yes    Alcohol/week: 1.0 standard drinks    Types: 1 Glasses of wine per week    Comment: weekly  . Drug use: Never  . Sexual activity: Not on file  Lifestyle  . Physical activity:    Days per week: Not on file    Minutes per session: Not on file  . Stress: Not on file  Relationships  . Social connections:    Talks on phone: Not on file    Gets together:  Not on file    Attends religious service: Not on file    Active member of club or organization: Not on file    Attends meetings of clubs or organizations: Not on file    Relationship status: Not on file  . Intimate partner violence:    Fear of current or ex partner: Not on file    Emotionally abused: Not on file    Physically abused: Not on file    Forced sexual activity: Not on file  Other Topics Concern  . Not on file  Social History Narrative  . Not on file    FAMILY HISTORY: Family History  Problem Relation Age of Onset  . Diabetes Mellitus II Mother   . ALS Father     ALLERGIES:  has No Known Allergies.  MEDICATIONS:  Current Facility-Administered Medications  Medication Dose Route Frequency Provider Last Rate Last Dose  . acetaminophen (TYLENOL) tablet 650 mg  650 mg Oral Q6H PRN Ivor Costa, MD       Or  . acetaminophen (TYLENOL) suppository 650 mg  650 mg Rectal Q6H PRN Ivor Costa, MD      . atorvastatin (LIPITOR) tablet 40 mg  40 mg Oral q1800 Arrien, Jimmy Picket, MD      . dexamethasone (DECADRON) tablet 4  mg  4 mg Oral Q6H Ivor Costa, MD   4 mg at 02/16/18 1344  . famotidine (PEPCID) tablet 20 mg  20 mg Oral BID PRN Arrien, Jimmy Picket, MD      . heparin injection 5,000 Units  5,000 Units Subcutaneous Q8H Ivor Costa, MD   5,000 Units at 02/16/18 1344  . hydrALAZINE (APRESOLINE) injection 5 mg  5 mg Intravenous Q2H PRN Ivor Costa, MD      . iohexol (OMNIPAQUE) 300 MG/ML solution 100 mL  100 mL Intravenous Once PRN Ivor Costa, MD      . lisinopril (PRINIVIL,ZESTRIL) tablet 10 mg  10 mg Oral Daily Arrien, Jimmy Picket, MD   10 mg at 02/16/18 1344  . ondansetron (ZOFRAN) tablet 4 mg  4 mg Oral Q6H PRN Ivor Costa, MD       Or  . ondansetron [facility]) injection 4 mg  4 mg Intravenous Q6H PRN Ivor Costa, MD      . Derrill Memo ON 02/17/2018] pneumococcal 23 valent vaccine (PNU-IMMUNE) injection 0.5 mL  0.5 mL Intramuscular Tomorrow-1000 Ivor Costa, MD        REVIEW  OF SYSTEMS:   Constitutional: Denies fevers, chills or abnormal weight loss Eyes: Denies blurriness of vision Ears, nose, mouth, throat, and face: Denies mucositis or sore throat Respiratory: Denies cough, dyspnea or wheezes Cardiovascular: Denies palpitation, chest discomfort or lower extremity swelling Gastrointestinal:  Denies nausea, constipation, diarrhea GU: Denies dysuria or incontinence Skin: Denies abnormal skin rashes Neurological: Per HPI Musculoskeletal: Denies joint pain, back or neck discomfort. No decrease in ROM Behavioral/Psych: Denies anxiety, disturbance in thought content, and mood instability   PHYSICAL EXAMINATION: Vitals:   02/16/18 0555 02/16/18 0726  BP: 135/78 (!) 161/80  Pulse: 63 (!) 58  Resp: 18 18  Temp: 97.9 F (36.6 C) 97.9 F (36.6 C)  SpO2: 94% 100%   KPS: 80. General: Alert, cooperative, pleasant, in no acute distress Head: Normal EENT: No conjunctival injection or scleral icterus. Oral mucosa moist Lungs: Resp effort normal Cardiac: Regular rate and rhythm Abdomen: Soft, non-distended abdomen Skin: No rashes cyanosis or petechiae. Extremities: No clubbing or edema  NEUROLOGIC EXAM: Mental Status: Awake, alert, attentive to examiner. Oriented to self and environment. Language has modest impairment in fluency and syntax with intact comprehension.  Cranial Nerves: Visual acuity is grossly normal. Visual fields are full. Extra-ocular movements intact. No ptosis. Left UMN facial paresis, tongue midline. Motor: Tone and bulk are normal. Power is full in both arms and legs. Reflexes are symmetric, no pathologic reflexes present. Intact finger to nose bilaterally Sensory: Intact to light touch and temperature Gait: Deferred  LABORATORY DATA:  I have reviewed the data as listed Lab Results  Component Value Date   WBC 6.4 02/16/2018   HGB 11.7 (L) 02/16/2018   HCT 36.1 (L) 02/16/2018   MCV 92.3 02/16/2018   PLT 211 02/16/2018   Recent Labs     02/15/18 2226 02/16/18 0546  NA 137 136  K 4.1 4.4  CL 106 107  CO2 23 23  GLUCOSE 150* 192*  BUN 20 19  CREATININE 1.04 1.11  CALCIUM 9.4 9.0  GFRNONAA >60 >60  GFRAA >60 >60    RADIOGRAPHIC STUDIES: I have personally reviewed the radiological images as listed and agreed with the findings in the report. Ct Head Wo Contrast  Result Date: 02/15/2018 CLINICAL DATA:  Difficulty speaking and walking EXAM: CT HEAD WITHOUT CONTRAST TECHNIQUE: Contiguous axial images were obtained from the base of the  skull through the vertex without intravenous contrast. COMPARISON:  None. FINDINGS: Brain: No hemorrhage or acute territorial infarct. Cystic and solid mass within the right basal ganglial region measuring 3.9 x 3.3 by 2.9 cm. Localized mass effect with compression of the right lateral ventricle. Approximately 5 mm midline shift to the left. Hypodense edema surrounding the mass. Edema within the right temporal. Mild ventricular enlargement. Vascular: No hyperdense vessels.  Carotid vascular calcification. Skull: Normal. Negative for fracture or focal lesion. Sinuses/Orbits: Complete opacification of left maxillary sinus. Mucosal thickening in the left ethmoid sinus. Other: None IMPRESSION: 3.9 x 3.3 x 2.9 cm cystic and solid mass within the right hemisphere with localized mass effect and approximately 5 mm midline shift to the left. Moderate edema surrounding the mass lesion. Findings could be secondary to metastatic disease, primary brain mass, or possibly infection. Contrast-enhanced MRI is suggested for further evaluation. Electronically Signed   By: Donavan Foil M.D.   On: 02/15/2018 23:30   Ct Chest W Contrast  Result Date: 02/16/2018 CLINICAL DATA:  Shortness of breath. Left facial droop. Brain mass. Assessment for malignancy within the chest, abdomen or pelvis. EXAM: CT CHEST, ABDOMEN, AND PELVIS WITH CONTRAST TECHNIQUE: Multidetector CT imaging of the chest, abdomen and pelvis was  performed following the standard protocol during bolus administration of intravenous contrast. CONTRAST:  118mL OMNIPAQUE IOHEXOL 300 MG/ML  SOLN COMPARISON:  None. FINDINGS: CT CHEST FINDINGS Cardiovascular: There is calcific aortic atherosclerosis. Heart size is normal. Mediastinum/Nodes: No mediastinal, hilar or axillary lymphadenopathy. The visualized thyroid and thoracic esophageal course are unremarkable. Lungs/Pleura: There is a 4 mm nodule of the left lung base. The lungs are otherwise clear. Musculoskeletal: No chest wall mass or suspicious bone lesions identified. CT ABDOMEN PELVIS FINDINGS HEPATOBILIARY: The hepatic contours and density are normal. There is no intra- or extrahepatic biliary dilatation. Small cyst in the left hepatic lobe. The gallbladder is normal. PANCREAS: The pancreatic parenchymal contours are normal and there is no ductal dilatation. There is no peripancreatic fluid collection. SPLEEN: Normal. ADRENALS/URINARY TRACT: --Adrenal glands: Normal. --Right kidney/ureter: There is a lower pole cyst measuring 4.0 cm. No hydronephrosis, nephroureterolithiasis, perinephric stranding or solid renal mass. --Left kidney/ureter: No hydronephrosis, nephroureterolithiasis, perinephric stranding or solid renal mass. --Urinary bladder: There is a stone within the dependent portion of the urinary bladder measuring 14 x 9 mm. STOMACH/BOWEL: --Stomach/Duodenum: There is no hiatal hernia or other gastric abnormality. The duodenal course and caliber are normal. --Small bowel: No dilatation or inflammation. --Colon: No focal abnormality. --Appendix: Normal. VASCULAR/LYMPHATIC: Atherosclerotic calcification is present within the non-aneurysmal abdominal aorta, without hemodynamically significant stenosis. The portal vein, splenic vein, superior mesenteric vein and IVC are patent. No abdominal or pelvic lymphadenopathy. REPRODUCTIVE: Enlarged, heterogeneous prostate gland measures 6.3 x 5.7 x 7.7 cm. The  seminal vesicles are normal. MUSCULOSKELETAL. No bony spinal canal stenosis or focal osseous abnormality. OTHER: There is a small, fluid containing left inguinal hernia. Sequelae of prior hernia repair in the anterior left abdomen. IMPRESSION: 1. No acute abnormality of the chest, abdomen or pelvis. 2. 14 x 9 mm stone within the dependent portion of the urinary bladder. 3. Enlarged, heterogeneous prostate gland. Correlate with PSA. 4. 4 mm left lung base pulmonary nodule. No follow-up needed if patient is low-risk. Non-contrast chest CT can be considered in 12 months if patient is high-risk. This recommendation follows the consensus statement: Guidelines for Management of Incidental Pulmonary Nodules Detected on CT Images: From the Fleischner Society 2017; Radiology 2017; [id number]. Aortic Atherosclerosis (  ICD10-I70.0). Electronically Signed   By: Ulyses Jarred M.D.   On: 02/16/2018 03:43   Mr Brain W And Wo Contrast  Result Date: 02/16/2018 CLINICAL DATA:  Brain mass.  Facial paralysis. EXAM: MRI HEAD WITHOUT AND WITH CONTRAST TECHNIQUE: Multiplanar, multiecho pulse sequences of the brain and surrounding structures were obtained without and with intravenous contrast. CONTRAST:  7 mL Gadavist COMPARISON:  Head CT 02/15/2018 FINDINGS: BRAIN: There is a mass centered in the right frontal white matter with a large cystic/necrotic component. There is a solid component at the lateral aspect, measuring 2.5 x 1.4 x 2.7 cm. The solid component at the inferior aspect measures 3.7 x 2.3 x 1.1 cm. Including the cystic component, the complex measures 4.0 x 4.6 x 4.0 cm. There is no diffusion restriction within the mass. There is hyperintense T2-weighted signal that extends to the right insula, along the right internal capsule and into the right temporal white matter. There are scattered foci of white matter hyperintensity within both hemispheres otherwise. The cerebral and cerebellar volume are age-appropriate.  Susceptibility-sensitive sequences show no chronic microhemorrhage or superficial siderosis. Leftward midline shift measures 4 mm. VASCULAR: Major intracranial arterial and venous sinus flow voids are normal. SKULL AND UPPER CERVICAL SPINE: Calvarial bone marrow signal is normal. There is no skull base mass. Visualized upper cervical spine and soft tissues are normal. SINUSES/ORBITS: There is complete opacification of the left maxillary sinus. The orbits are normal. IMPRESSION: 1. 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 Surrounding hyperintense T2-weighted signal likely indicates a combination of edema and nonenhancing tumor. There is no diffusion restriction within the necrotic component to suggest abscess. 2. Leftward midline shift measures 4 mm. Electronically Signed   By: Ulyses Jarred M.D.   On: 02/16/2018 03:11   Ct Abdomen Pelvis W Contrast  Result Date: 02/16/2018 CLINICAL DATA:  Shortness of breath. Left facial droop. Brain mass. Assessment for malignancy within the chest, abdomen or pelvis. EXAM: CT CHEST, ABDOMEN, AND PELVIS WITH CONTRAST TECHNIQUE: Multidetector CT imaging of the chest, abdomen and pelvis was performed following the standard protocol during bolus administration of intravenous contrast. CONTRAST:  18mL OMNIPAQUE IOHEXOL 300 MG/ML  SOLN COMPARISON:  None. FINDINGS: CT CHEST FINDINGS Cardiovascular: There is calcific aortic atherosclerosis. Heart size is normal. Mediastinum/Nodes: No mediastinal, hilar or axillary lymphadenopathy. The visualized thyroid and thoracic esophageal course are unremarkable. Lungs/Pleura: There is a 4 mm nodule of the left lung base. The lungs are otherwise clear. Musculoskeletal: No chest wall mass or suspicious bone lesions identified. CT ABDOMEN PELVIS FINDINGS HEPATOBILIARY: The hepatic contours and density are normal. There is no intra- or  extrahepatic biliary dilatation. Small cyst in the left hepatic lobe. The gallbladder is normal. PANCREAS: The pancreatic parenchymal contours are normal and there is no ductal dilatation. There is no peripancreatic fluid collection. SPLEEN: Normal. ADRENALS/URINARY TRACT: --Adrenal glands: Normal. --Right kidney/ureter: There is a lower pole cyst measuring 4.0 cm. No hydronephrosis, nephroureterolithiasis, perinephric stranding or solid renal mass. --Left kidney/ureter: No hydronephrosis, nephroureterolithiasis, perinephric stranding or solid renal mass. --Urinary bladder: There is a stone within the dependent portion of the urinary bladder measuring 14 x 9 mm. STOMACH/BOWEL: --Stomach/Duodenum: There is no hiatal hernia or other gastric abnormality. The duodenal course and caliber are normal. --Small bowel: No dilatation or inflammation. --Colon: No focal abnormality. --Appendix: Normal. VASCULAR/LYMPHATIC: Atherosclerotic calcification is present within the non-aneurysmal abdominal aorta, without hemodynamically  significant stenosis. The portal vein, splenic vein, superior mesenteric vein and IVC are patent. No abdominal or pelvic lymphadenopathy. REPRODUCTIVE: Enlarged, heterogeneous prostate gland measures 6.3 x 5.7 x 7.7 cm. The seminal vesicles are normal. MUSCULOSKELETAL. No bony spinal canal stenosis or focal osseous abnormality. OTHER: There is a small, fluid containing left inguinal hernia. Sequelae of prior hernia repair in the anterior left abdomen. IMPRESSION: 1. No acute abnormality of the chest, abdomen or pelvis. 2. 14 x 9 mm stone within the dependent portion of the urinary bladder. 3. Enlarged, heterogeneous prostate gland. Correlate with PSA. 4. 4 mm left lung base pulmonary nodule. No follow-up needed if patient is low-risk. Non-contrast chest CT can be considered in 12 months if patient is high-risk. This recommendation follows the consensus statement: Guidelines for Management of Incidental  Pulmonary Nodules Detected on CT Images: From the Fleischner Society 2017; Radiology 2017; [id number]. Aortic Atherosclerosis (ICD10-I70.0). Electronically Signed   By: Ulyses Jarred M.D.   On: 02/16/2018 03:43    ASSESSMENT & PLAN:  Brain Tumor  Mr. 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  He does have modest focal impairment, mostly with regards to language and cognition.   His case will be discussed in multi-disciplinary brain tumor board on Monday AM.  Likely recommendation will be for biopsy or excisional biopsy based on tumor location.  From my standpoint, he is ok to go home with 4mg  daily dexamethasone.  No further inpatient workup.  [facility] will call patient/family to arrange re-admission for surgery in the next 1-2 weeks.  Contact information was provided for my office.  All questions were answered. The patient knows to call the clinic with any problems, questions or concerns.  The total time spent in the encounter was 55 minutes and more than 50% was on counseling and review of test results     Ventura Sellers, MD 02/16/2018 3:22 PM

## 2018-02-21 ENCOUNTER — Other Ambulatory Visit: Payer: Medicare Other

## 2018-02-22 NOTE — Progress Notes (Signed)
Brain and Spine Tumor Board Documentation  Bryan [name] was presented by Cecil Cobbs, MD at Brain and Spine Tumor Board on 02/22/2018, which included representatives from neuro oncology, radiation oncology, surgical oncology, radiology, pathology, navigation.  Bryan [name] was presented as a new patient with history of the following treatments:  .  Additionally, we reviewed previous medical and familial history, history of present illness, and recent lab results along with all available histopathologic and imaging studies. The tumor board considered available treatment options and made the following recommendations:  Surgery resection  Tumor board is a meeting of clinicians from various specialty areas who evaluate and discuss patients for whom a multidisciplinary approach is being considered. Final determinations in the plan of care are those of the provider(s). The responsibility for follow up of recommendations given during tumor board is that of the provider.   Today's extended care, comprehensive team conference, Bryan [name] was not present for the discussion and was not examined.

## 2018-02-25 DIAGNOSIS — E119 Type 2 diabetes mellitus without complications: Secondary | ICD-10-CM | POA: Diagnosis not present

## 2018-02-25 DIAGNOSIS — E7849 Other hyperlipidemia: Secondary | ICD-10-CM | POA: Diagnosis not present

## 2018-02-25 DIAGNOSIS — C719 Malignant neoplasm of brain, unspecified: Secondary | ICD-10-CM | POA: Diagnosis not present

## 2018-02-25 DIAGNOSIS — R350 Frequency of micturition: Secondary | ICD-10-CM | POA: Diagnosis not present

## 2018-02-25 DIAGNOSIS — E038 Other specified hypothyroidism: Secondary | ICD-10-CM | POA: Diagnosis not present

## 2018-02-25 DIAGNOSIS — I10 Essential (primary) hypertension: Secondary | ICD-10-CM | POA: Diagnosis not present

## 2018-03-01 DIAGNOSIS — D496 Neoplasm of unspecified behavior of brain: Secondary | ICD-10-CM | POA: Diagnosis not present

## 2018-03-01 DIAGNOSIS — R791 Abnormal coagulation profile: Secondary | ICD-10-CM | POA: Diagnosis not present

## 2018-03-01 DIAGNOSIS — R399 Unspecified symptoms and signs involving the genitourinary system: Secondary | ICD-10-CM | POA: Diagnosis not present

## 2018-03-03 ENCOUNTER — Other Ambulatory Visit: Payer: Self-pay | Admitting: Radiation Therapy

## 2018-03-04 DIAGNOSIS — R220 Localized swelling, mass and lump, head: Secondary | ICD-10-CM | POA: Diagnosis not present

## 2018-03-04 DIAGNOSIS — D496 Neoplasm of unspecified behavior of brain: Secondary | ICD-10-CM | POA: Diagnosis not present

## 2018-03-06 DIAGNOSIS — G8194 Hemiplegia, unspecified affecting left nondominant side: Secondary | ICD-10-CM | POA: Diagnosis not present

## 2018-03-06 DIAGNOSIS — G8190 Hemiplegia, unspecified affecting unspecified side: Secondary | ICD-10-CM | POA: Diagnosis not present

## 2018-03-06 DIAGNOSIS — Z7984 Long term (current) use of oral hypoglycemic drugs: Secondary | ICD-10-CM | POA: Diagnosis not present

## 2018-03-06 DIAGNOSIS — E871 Hypo-osmolality and hyponatremia: Secondary | ICD-10-CM | POA: Diagnosis not present

## 2018-03-06 DIAGNOSIS — E1165 Type 2 diabetes mellitus with hyperglycemia: Secondary | ICD-10-CM | POA: Diagnosis not present

## 2018-03-06 DIAGNOSIS — R4701 Aphasia: Secondary | ICD-10-CM | POA: Diagnosis not present

## 2018-03-06 DIAGNOSIS — Z833 Family history of diabetes mellitus: Secondary | ICD-10-CM | POA: Diagnosis not present

## 2018-03-06 DIAGNOSIS — K219 Gastro-esophageal reflux disease without esophagitis: Secondary | ICD-10-CM | POA: Diagnosis present

## 2018-03-06 DIAGNOSIS — R531 Weakness: Secondary | ICD-10-CM | POA: Diagnosis present

## 2018-03-06 DIAGNOSIS — C719 Malignant neoplasm of brain, unspecified: Secondary | ICD-10-CM | POA: Diagnosis not present

## 2018-03-06 DIAGNOSIS — R471 Dysarthria and anarthria: Secondary | ICD-10-CM | POA: Diagnosis present

## 2018-03-06 DIAGNOSIS — Z298 Encounter for other specified prophylactic measures: Secondary | ICD-10-CM | POA: Diagnosis not present

## 2018-03-06 DIAGNOSIS — Z79899 Other long term (current) drug therapy: Secondary | ICD-10-CM | POA: Diagnosis not present

## 2018-03-06 DIAGNOSIS — D62 Acute posthemorrhagic anemia: Secondary | ICD-10-CM | POA: Diagnosis not present

## 2018-03-06 DIAGNOSIS — C718 Malignant neoplasm of overlapping sites of brain: Secondary | ICD-10-CM | POA: Diagnosis present

## 2018-03-06 DIAGNOSIS — E782 Mixed hyperlipidemia: Secondary | ICD-10-CM | POA: Diagnosis present

## 2018-03-06 DIAGNOSIS — E8809 Other disorders of plasma-protein metabolism, not elsewhere classified: Secondary | ICD-10-CM | POA: Diagnosis present

## 2018-03-06 DIAGNOSIS — E7800 Pure hypercholesterolemia, unspecified: Secondary | ICD-10-CM | POA: Diagnosis present

## 2018-03-06 DIAGNOSIS — Z85828 Personal history of other malignant neoplasm of skin: Secondary | ICD-10-CM | POA: Diagnosis not present

## 2018-03-06 DIAGNOSIS — R29810 Facial weakness: Secondary | ICD-10-CM | POA: Diagnosis present

## 2018-03-06 DIAGNOSIS — Z6828 Body mass index (BMI) 28.0-28.9, adult: Secondary | ICD-10-CM | POA: Diagnosis not present

## 2018-03-06 DIAGNOSIS — E46 Unspecified protein-calorie malnutrition: Secondary | ICD-10-CM | POA: Diagnosis not present

## 2018-03-06 DIAGNOSIS — R569 Unspecified convulsions: Secondary | ICD-10-CM | POA: Diagnosis present

## 2018-03-06 DIAGNOSIS — R1310 Dysphagia, unspecified: Secondary | ICD-10-CM | POA: Diagnosis not present

## 2018-03-06 DIAGNOSIS — Z9889 Other specified postprocedural states: Secondary | ICD-10-CM | POA: Diagnosis not present

## 2018-03-06 DIAGNOSIS — T380X5A Adverse effect of glucocorticoids and synthetic analogues, initial encounter: Secondary | ICD-10-CM | POA: Diagnosis not present

## 2018-03-06 DIAGNOSIS — I10 Essential (primary) hypertension: Secondary | ICD-10-CM | POA: Diagnosis present

## 2018-03-06 DIAGNOSIS — R739 Hyperglycemia, unspecified: Secondary | ICD-10-CM | POA: Diagnosis not present

## 2018-03-06 DIAGNOSIS — R634 Abnormal weight loss: Secondary | ICD-10-CM | POA: Diagnosis present

## 2018-03-06 DIAGNOSIS — D496 Neoplasm of unspecified behavior of brain: Secondary | ICD-10-CM | POA: Diagnosis not present

## 2018-03-06 DIAGNOSIS — Z01818 Encounter for other preprocedural examination: Secondary | ICD-10-CM | POA: Diagnosis not present

## 2018-03-06 DIAGNOSIS — G8918 Other acute postprocedural pain: Secondary | ICD-10-CM | POA: Diagnosis not present

## 2018-03-06 DIAGNOSIS — M6281 Muscle weakness (generalized): Secondary | ICD-10-CM | POA: Diagnosis not present

## 2018-03-06 DIAGNOSIS — Z87891 Personal history of nicotine dependence: Secondary | ICD-10-CM | POA: Diagnosis not present

## 2018-03-06 DIAGNOSIS — E039 Hypothyroidism, unspecified: Secondary | ICD-10-CM | POA: Diagnosis present

## 2018-03-06 DIAGNOSIS — R4702 Dysphasia: Secondary | ICD-10-CM | POA: Diagnosis present

## 2018-03-06 DIAGNOSIS — R7303 Prediabetes: Secondary | ICD-10-CM | POA: Diagnosis not present

## 2018-03-06 DIAGNOSIS — E785 Hyperlipidemia, unspecified: Secondary | ICD-10-CM | POA: Diagnosis not present

## 2018-03-07 DIAGNOSIS — C718 Malignant neoplasm of overlapping sites of brain: Secondary | ICD-10-CM | POA: Diagnosis not present

## 2018-03-08 DIAGNOSIS — Z9889 Other specified postprocedural states: Secondary | ICD-10-CM | POA: Insufficient documentation

## 2018-03-10 ENCOUNTER — Encounter: Payer: Self-pay | Admitting: Occupational Therapy

## 2018-03-10 NOTE — PMR Pre-admission (Signed)
[facility] PMR Admission Coordinator Pre-Admission Assessment  Patient: Bryan [name] is an 77 y.o., male MRN: [id number] DOB: 12-10-1940 Height: _0  (165.1 cm) Weight: 77.1 kg  Insurance Information HMO:     PPO:      PCP:      IPA:      80/20:      OTHER: PRIMARY: Medicare Part A and B      Policy#: [id number]      Subscriber: Patient CM Name:       Phone#:      Fax#:  Pre-Cert#:       Employer:  Benefits:  Phone #: NA     Name: Verified eligibility via [place] on 03/10/18  Eff. Date: Part A: 11/22/06; Part B: 03/23/13     Deduct: $1,364      Out of Pocket Max: NA      Life Max: NA CIR: Covered per Medicare Guidelines once yearly deductible is met      SNF: days 1-20, 100%; days 21-100, 80% Outpatient: 80%     Co-Pay: 20% Home Health: 100%      Co-Pay:  DME: 80%     Co-Pay: 20% Providers: Pt's choice SECONDARY: BCBS      Policy#: [id number]      Subscriber: Patient CM Name:       Phone#:      Fax#:  Pre-Cert#:       Employer:  Benefits:  Phone #:      Name:  Eff. Date:      Deduct:       Out of Pocket Max:       Life Max:  CIR:       SNF:  Outpatient:      Co-Pay:  Home Health:      Co-Pay:  DME:      Co-Pay:   Medicaid Application Date:      Case Manager:  Disability Application Date:       Case Worker:   Emergency Contact Information Contact Information    Name Relation Home Work Mobile   [place] Spouse [phone]  [phone]      Current Medical History  Patient Admitting Diagnosis: brain tumor resection  History of Present Illness: Pt is a 77 yo male with history of SCC, HTN, HLD, prediabetes, and hypothyroidism. Pt had been experiencing word finding difficulty, confusion, and abnormalities of gait since November of 2019. Pt was taken to the ED on 11/26 after presentation of dragging his left foot and left facial droop. An MRI showed a right insular lesion. Pt underwent surgical management with a right frontotemporal craniotomy on 03/07/18. Pt  has progressed well with therapies but continues to require ongoing intensive therapy needs. CIR has been recommended. Pt is to be admitted to CIR on 03/11/18.   Patient's medical record from [facility] has been reviewed by the rehabilitation admission coordinator and physician.     Past Medical History  Past Medical History:  Diagnosis Date  . Arthritis   . Diabetes mellitus without complication ([place])   . GERD (gastroesophageal reflux disease)   . Hypercholesterolemia   . Hypertension   . Thyroid disease     Family History   family history includes ALS in his father; Diabetes Mellitus II in his mother.  Prior Rehab/Hospitalizations Has the patient had major surgery during 100 days prior to admission? No    Current Medications  Current Outpatient Medications:  .  atorvastatin (LIPITOR) 40 MG tablet, Take 40  mg by mouth daily., Disp: , Rfl:  .  dexamethasone (DECADRON) 4 MG tablet, Take 1 tablet (4 mg total) by mouth daily., Disp: 30 tablet, Rfl: 0 .  lisinopril (PRINIVIL,ZESTRIL) 10 MG tablet, Take 10 mg by mouth daily., Disp: , Rfl:  .  metFORMIN (GLUCOPHAGE) 500 MG tablet, Take 1,000 mg by mouth 2 (two) times daily with a meal., Disp: , Rfl:   Patients Current Diet:   Diet Order    None      Precautions / Restrictions Precautions Precautions: Fall   Has the patient had 2 or more falls or a fall with injury in the past year?No  Prior Activity Level Community (5-7x/wk): active PTA; was retired but enjoys fishing; drove PTA  Prior Functional Level Do you want Prior Function Level of Independence: Independent from other? Self Care: Did the patient need help bathing, dressing, using the toilet or eating?  Independent  Indoor Mobility: Did the patient need assistance with walking from room to room (with or without device)? Independent  Stairs: Did the patient need assistance with internal or external stairs (with or without device)?  Independent  Functional Cognition: Did the patient need help planning regular tasks such as shopping or remembering to take medications? Independent  Home Assistive Devices / Equipment Home Assistive Devices/Equipment: None  Prior Device Use: Indicate devices/aids used by the patient prior to current illness, exacerbation or injury? None of the above  Prior Functional Level     Prior Functional Level Current Functional Level  Bed Mobility  Independent  Mod A  Transfers   Independent  Min A x2  Mobility - Walk/Wheelchair  Independent  Mod A x2  Mobility - Ambulation/Gait  Independent  Mod A x2  Upper Body Dressing  Independent  Max A  Lower Body Dressing  Independent  not formally assessed; on AMPAC 6 Clicks, pt is categorized as requiring "A lot" of assistance  Grooming  Independent  Mod A  Eating/Drinking  Independent  not formally assessed  Toilet Transfer  Independent  not formally assessed  Bladder Continence  continent  continent  Bowel Management   continent  continent  Stair Climbing Independent  NT  Communication  WNL  dysarthric  Memory  WNL  Not formally assessed; A&Ox4  Cooking/Meal Prep   Independent      Housework   Independent   Money Management   Independent   Driving   Independent     Special needs/care consideration BiPAP/CPAP: no CPM: no Continuous Drip IV: no Dialysis: no        Days: NA Life Vest: no Oxygen: no Special Bed: no Trach Size: no Wound Vac (area): no      Location: no Skin: sutures to surgical incision site on head; healed small groin incision                     Bowel mgmt:continent, last BM: 03/09/18 Bladder mgmt: continent, urinal  Diabetic mgmt: yes  Previous Home Environment Living Arrangements: Spouse/significant other  Lives With: Spouse Available Help at Discharge: Family Type of Home: House Home Layout: One level Home Access: Stairs to enter Entrance Stairs-Rails: None Entrance Stairs-Number of Steps: 1 step then  threshold Bathroom Shower/Tub: Multimedia programmer: Standard Bathroom Accessibility: Yes How Accessible: Accessible via walker Home Care Services: No  Discharge Living Setting Plans for Discharge Living Setting: Patient's home, Lives with (comment)(wife) Type of Home at Discharge: House Discharge Home Layout: One level Discharge Home Access: Stairs to  enter Entrance Stairs-Rails: None Entrance Stairs-Number of Steps: 1 step then threshold Discharge Bathroom Shower/Tub: Walk-in shower Discharge Bathroom Toilet: Standard Discharge Bathroom Accessibility: Yes How Accessible: Accessible via walker Does the patient have any problems obtaining your medications?: No  Social/Family/Support Systems Patient Roles: Spouse Contact Information: wife Curt Bears): [phone] Anticipated Caregiver: wife, daughter, and son in law  Anticipated Caregiver's Contact Information: see above Ability/Limitations of Caregiver: Min A Caregiver Availability: 24/7 Discharge Plan Discussed with Primary Caregiver: Yes Is Caregiver In Agreement with Plan?: Yes Does Caregiver/Family have Issues with Lodging/Transportation while Pt is in Rehab?: No  Goals/Additional Needs Patient/Family Goal for Rehab: PT/OT: Supervision, Min A; SLP: Mod I Expected length of stay: 16-20 days Cultural Considerations: [employer] Dietary Needs: mechanical soft, card modified, thin liquids Equipment Needs: TBD Pt/Family Agrees to Admission and willing to participate: Yes Program Orientation Provided & Reviewed with Pt/Caregiver Including Roles  & Responsibilities: Yes(wife)  Barriers to Discharge: Pending chemo/radiation  Patient Condition: I have reviewed medical records from [facility], spoken with CM and RN and the patient and his wife. I discussed via phone details for CIR and gathered all pertinent information for an inpatient rehabilitation assessment.  Patient will benefit from ongoing PT, OT, and  SLP, can actively participate in 3 hours of therapy a day 5 days of the week, and can make measurable gains during the admission.  Patient will also benefit from the coordinated team approach during an Inpatient Acute Rehabilitation admission.  The patient will receive intensive therapy as well as Rehabilitation physician, nursing, social worker, and care management interventions.  Due to safety, skin/wound care, disease management, medical administration, pain management, patient education the patient requires 24 hour a day rehabilitation nursing.  The patient is currently Mod Ax2  with mobility and Mod/Max A with basic ADLs.  Discharge setting and therapy post discharge at home with home health is anticipated.  Patient has agreed to participate in the Acute Inpatient Rehabilitation Program and will admit today 03/11/18.  Preadmission Screen Completed By:  Jhonnie Garner, 03/11/2018 8:59 AM ______________________________________________________________________   Discussed status with Dr. Posey Pronto on 03/10/18 at 5:00PM and received telephone approval for admission today.  Admission Coordinator:  Jhonnie Garner, time 7:56AM/Date 03/11/18   Assessment/Plan: Diagnosis: brain tumor resection  1. Does the need for close, 24 hr/day  Medical supervision in concert with the patient's rehab needs make it unreasonable for this patient to be served in a less intensive setting? Yes  2. Co-Morbidities requiring supervision/potential complications:  SCC, HTN, HLD, prediabetes, and hypothyroidism. 3. Due to bladder management, bowel management, skin/wound care, disease management and patient education, does the patient require 24 hr/day rehab nursing? Yes 4. Does the patient require coordinated care of a physician, rehab nurse, PT (1-2 hrs/day, 5 days/week), OT (1-2 hrs/day, 5 days/week) and SLP (1-2 hrs/day, 5 days/week) to address physical and functional deficits in the context of the above medical diagnosis(es)?  Yes Addressing deficits in the following areas: balance, endurance, locomotion, strength, transferring, bathing, dressing, toileting, cognition, speech, language and psychosocial support 5. Can the patient actively participate in an intensive therapy program of at least 3 hrs of therapy 5 days a week? Yes 6. The potential for patient to make measurable gains while on inpatient rehab is excellent 7. Anticipated functional outcomes upon discharge from inpatients are: supervision and min assist PT, supervision and min assist OT, modified independent SLP 8. Estimated rehab length of stay to reach the above functional goals is: 10-14 days. 9. Anticipated D/C setting:  Home 10. Anticipated post D/C treatments: HH therapy and Home excercise program 11. Overall Rehab/Functional Prognosis: good     RECOMMENDATIONS: This patient's condition is appropriate for continued rehabilitative care in the following setting: CIR Patient has agreed to participate in recommended program. Yes Note that insurance prior authorization may be required for reimbursement for recommended care.  Jhonnie Garner 03/11/2018  Delice Lesch, MD, ABPMR

## 2018-03-11 ENCOUNTER — Other Ambulatory Visit: Payer: Self-pay

## 2018-03-11 ENCOUNTER — Inpatient Hospital Stay (HOSPITAL_COMMUNITY)
Admission: RE | Admit: 2018-03-11 | Discharge: 2018-04-06 | DRG: 057 | Disposition: A | Discharge status: Home Health | Payer: Medicare Other | Source: Intra-hospital | Attending: Physical Medicine & Rehabilitation | Admitting: Physical Medicine & Rehabilitation

## 2018-03-11 ENCOUNTER — Encounter (HOSPITAL_COMMUNITY): Payer: Self-pay | Admitting: Emergency Medicine

## 2018-03-11 DIAGNOSIS — D496 Neoplasm of unspecified behavior of brain: Secondary | ICD-10-CM | POA: Diagnosis present

## 2018-03-11 DIAGNOSIS — Z510 Encounter for antineoplastic radiation therapy: Secondary | ICD-10-CM | POA: Diagnosis not present

## 2018-03-11 DIAGNOSIS — E7800 Pure hypercholesterolemia, unspecified: Secondary | ICD-10-CM | POA: Diagnosis present

## 2018-03-11 DIAGNOSIS — E46 Unspecified protein-calorie malnutrition: Secondary | ICD-10-CM | POA: Diagnosis not present

## 2018-03-11 DIAGNOSIS — E119 Type 2 diabetes mellitus without complications: Secondary | ICD-10-CM | POA: Diagnosis not present

## 2018-03-11 DIAGNOSIS — R569 Unspecified convulsions: Secondary | ICD-10-CM

## 2018-03-11 DIAGNOSIS — Q043 Other reduction deformities of brain: Secondary | ICD-10-CM | POA: Diagnosis not present

## 2018-03-11 DIAGNOSIS — E079 Disorder of thyroid, unspecified: Secondary | ICD-10-CM | POA: Diagnosis not present

## 2018-03-11 DIAGNOSIS — Z87891 Personal history of nicotine dependence: Secondary | ICD-10-CM

## 2018-03-11 DIAGNOSIS — G8194 Hemiplegia, unspecified affecting left nondominant side: Secondary | ICD-10-CM | POA: Diagnosis present

## 2018-03-11 DIAGNOSIS — R471 Dysarthria and anarthria: Secondary | ICD-10-CM | POA: Diagnosis not present

## 2018-03-11 DIAGNOSIS — Z85828 Personal history of other malignant neoplasm of skin: Secondary | ICD-10-CM | POA: Diagnosis not present

## 2018-03-11 DIAGNOSIS — G8918 Other acute postprocedural pain: Secondary | ICD-10-CM | POA: Diagnosis not present

## 2018-03-11 DIAGNOSIS — Z833 Family history of diabetes mellitus: Secondary | ICD-10-CM | POA: Diagnosis not present

## 2018-03-11 DIAGNOSIS — D62 Acute posthemorrhagic anemia: Secondary | ICD-10-CM

## 2018-03-11 DIAGNOSIS — E1165 Type 2 diabetes mellitus with hyperglycemia: Secondary | ICD-10-CM | POA: Diagnosis present

## 2018-03-11 DIAGNOSIS — R253 Fasciculation: Secondary | ICD-10-CM | POA: Diagnosis not present

## 2018-03-11 DIAGNOSIS — R29810 Facial weakness: Secondary | ICD-10-CM | POA: Diagnosis present

## 2018-03-11 DIAGNOSIS — E8809 Other disorders of plasma-protein metabolism, not elsewhere classified: Secondary | ICD-10-CM | POA: Diagnosis present

## 2018-03-11 DIAGNOSIS — G8190 Hemiplegia, unspecified affecting unspecified side: Secondary | ICD-10-CM | POA: Diagnosis not present

## 2018-03-11 DIAGNOSIS — C719 Malignant neoplasm of brain, unspecified: Secondary | ICD-10-CM | POA: Diagnosis present

## 2018-03-11 DIAGNOSIS — R7303 Prediabetes: Secondary | ICD-10-CM | POA: Diagnosis not present

## 2018-03-11 DIAGNOSIS — Z79899 Other long term (current) drug therapy: Secondary | ICD-10-CM | POA: Diagnosis not present

## 2018-03-11 DIAGNOSIS — R531 Weakness: Secondary | ICD-10-CM | POA: Diagnosis present

## 2018-03-11 DIAGNOSIS — C713 Malignant neoplasm of parietal lobe: Secondary | ICD-10-CM | POA: Diagnosis not present

## 2018-03-11 DIAGNOSIS — E039 Hypothyroidism, unspecified: Secondary | ICD-10-CM | POA: Diagnosis present

## 2018-03-11 DIAGNOSIS — R112 Nausea with vomiting, unspecified: Secondary | ICD-10-CM | POA: Diagnosis not present

## 2018-03-11 DIAGNOSIS — K219 Gastro-esophageal reflux disease without esophagitis: Secondary | ICD-10-CM | POA: Diagnosis present

## 2018-03-11 DIAGNOSIS — I10 Essential (primary) hypertension: Secondary | ICD-10-CM | POA: Diagnosis present

## 2018-03-11 DIAGNOSIS — R609 Edema, unspecified: Secondary | ICD-10-CM | POA: Diagnosis not present

## 2018-03-11 DIAGNOSIS — M129 Arthropathy, unspecified: Secondary | ICD-10-CM | POA: Diagnosis not present

## 2018-03-11 DIAGNOSIS — Z9181 History of falling: Secondary | ICD-10-CM | POA: Diagnosis not present

## 2018-03-11 DIAGNOSIS — J320 Chronic maxillary sinusitis: Secondary | ICD-10-CM | POA: Diagnosis not present

## 2018-03-11 DIAGNOSIS — C711 Malignant neoplasm of frontal lobe: Secondary | ICD-10-CM | POA: Diagnosis not present

## 2018-03-11 DIAGNOSIS — Z923 Personal history of irradiation: Secondary | ICD-10-CM | POA: Diagnosis not present

## 2018-03-11 DIAGNOSIS — R739 Hyperglycemia, unspecified: Secondary | ICD-10-CM

## 2018-03-11 DIAGNOSIS — T380X5A Adverse effect of glucocorticoids and synthetic analogues, initial encounter: Secondary | ICD-10-CM | POA: Diagnosis present

## 2018-03-11 DIAGNOSIS — Z9889 Other specified postprocedural states: Secondary | ICD-10-CM | POA: Diagnosis not present

## 2018-03-11 DIAGNOSIS — E871 Hypo-osmolality and hyponatremia: Secondary | ICD-10-CM

## 2018-03-11 DIAGNOSIS — R4701 Aphasia: Secondary | ICD-10-CM

## 2018-03-11 DIAGNOSIS — Z794 Long term (current) use of insulin: Secondary | ICD-10-CM | POA: Diagnosis not present

## 2018-03-11 DIAGNOSIS — M1990 Unspecified osteoarthritis, unspecified site: Secondary | ICD-10-CM | POA: Diagnosis not present

## 2018-03-11 DIAGNOSIS — E785 Hyperlipidemia, unspecified: Secondary | ICD-10-CM | POA: Diagnosis present

## 2018-03-11 DIAGNOSIS — M25512 Pain in left shoulder: Secondary | ICD-10-CM | POA: Diagnosis not present

## 2018-03-11 DIAGNOSIS — Z7952 Long term (current) use of systemic steroids: Secondary | ICD-10-CM | POA: Diagnosis not present

## 2018-03-11 DIAGNOSIS — Z7984 Long term (current) use of oral hypoglycemic drugs: Secondary | ICD-10-CM | POA: Diagnosis not present

## 2018-03-11 DIAGNOSIS — Z298 Encounter for other specified prophylactic measures: Secondary | ICD-10-CM | POA: Diagnosis not present

## 2018-03-11 MED ORDER — SENNOSIDES-DOCUSATE SODIUM 8.6-50 MG PO TABS
2.00 | ORAL_TABLET | ORAL | Status: DC
Start: ? — End: 2018-03-11

## 2018-03-11 MED ORDER — LABETALOL HCL 5 MG/ML IV SOLN
10.00 | INTRAVENOUS | Status: DC
Start: ? — End: 2018-03-11

## 2018-03-11 MED ORDER — INSULIN REGULAR HUMAN 100 UNIT/ML IJ SOLN
.00 | INTRAMUSCULAR | Status: DC
Start: 2018-03-11 — End: 2018-03-11

## 2018-03-11 MED ORDER — LEVOTHYROXINE SODIUM 25 MCG PO TABS
75.00 | ORAL_TABLET | ORAL | Status: DC
Start: 2018-03-12 — End: 2018-03-11

## 2018-03-11 MED ORDER — LEVETIRACETAM 500 MG PO TABS
1000.00 | ORAL_TABLET | ORAL | Status: DC
Start: 2018-03-11 — End: 2018-03-11

## 2018-03-11 MED ORDER — POLYETHYLENE GLYCOL 3350 17 G PO PACK
17.00 | PACK | ORAL | Status: DC
Start: 2018-03-12 — End: 2018-03-11

## 2018-03-11 MED ORDER — ATORVASTATIN CALCIUM 40 MG PO TABS
40.0000 mg | ORAL_TABLET | Freq: Every day | ORAL | Status: DC
  Administered 2018-03-11 – 2018-04-05 (×26): 40 mg via ORAL
  Filled 2018-03-11 (×26): qty 1

## 2018-03-11 MED ORDER — SENNOSIDES-DOCUSATE SODIUM 8.6-50 MG PO TABS
1.0000 | ORAL_TABLET | Freq: Every evening | ORAL | Status: DC | PRN
  Administered 2018-03-20: 1 via ORAL
  Filled 2018-03-11 (×2): qty 1

## 2018-03-11 MED ORDER — PANTOPRAZOLE SODIUM 40 MG PO TBEC
40.00 | DELAYED_RELEASE_TABLET | ORAL | Status: DC
Start: 2018-03-12 — End: 2018-03-11

## 2018-03-11 MED ORDER — SODIUM CHLORIDE FLUSH 0.9 % IV SOLN
5.00 | INTRAVENOUS | Status: DC
Start: 2018-03-11 — End: 2018-03-11

## 2018-03-11 MED ORDER — MULTI-VITAMINS PO TABS
1.00 | ORAL_TABLET | ORAL | Status: DC
Start: 2018-03-12 — End: 2018-03-11

## 2018-03-11 MED ORDER — ACETAMINOPHEN 325 MG PO TABS
650.0000 mg | ORAL_TABLET | Freq: Four times a day (QID) | ORAL | Status: DC | PRN
  Administered 2018-03-11 – 2018-04-05 (×17): 650 mg via ORAL
  Filled 2018-03-11 (×19): qty 2

## 2018-03-11 MED ORDER — METFORMIN HCL 500 MG PO TABS
1000.0000 mg | ORAL_TABLET | Freq: Two times a day (BID) | ORAL | Status: DC
  Administered 2018-03-11 – 2018-04-06 (×52): 1000 mg via ORAL
  Filled 2018-03-11 (×53): qty 2

## 2018-03-11 MED ORDER — LEVETIRACETAM 500 MG PO TABS
1000.0000 mg | ORAL_TABLET | Freq: Two times a day (BID) | ORAL | Status: DC
  Administered 2018-03-11 – 2018-03-12 (×3): 1000 mg via ORAL
  Filled 2018-03-11 (×4): qty 2

## 2018-03-11 MED ORDER — FLUTICASONE PROPIONATE 50 MCG/ACT NA SUSP
1.0000 | Freq: Every day | NASAL | Status: DC
  Administered 2018-03-11 – 2018-04-05 (×25): 1 via NASAL
  Filled 2018-03-11: qty 16

## 2018-03-11 MED ORDER — DEXTROSE 50 % IV SOLN
12.50 | INTRAVENOUS | Status: DC
Start: ? — End: 2018-03-11

## 2018-03-11 MED ORDER — METFORMIN HCL 500 MG PO TABS
1000.00 | ORAL_TABLET | ORAL | Status: DC
Start: 2018-03-11 — End: 2018-03-11

## 2018-03-11 MED ORDER — FLUTICASONE PROPIONATE 50 MCG/ACT NA SUSP
1.00 | NASAL | Status: DC
Start: 2018-03-11 — End: 2018-03-11

## 2018-03-11 MED ORDER — DEXAMETHASONE 4 MG PO TABS
4.00 | ORAL_TABLET | ORAL | Status: DC
Start: 2018-03-11 — End: 2018-03-11

## 2018-03-11 MED ORDER — LEVOTHYROXINE SODIUM 75 MCG PO TABS
75.0000 ug | ORAL_TABLET | Freq: Every day | ORAL | Status: DC
  Administered 2018-03-12 – 2018-04-05 (×24): 75 ug via ORAL
  Filled 2018-03-11 (×26): qty 1

## 2018-03-11 MED ORDER — ACETAMINOPHEN 325 MG PO TABS
975.00 | ORAL_TABLET | ORAL | Status: DC
Start: 2018-03-11 — End: 2018-03-11

## 2018-03-11 MED ORDER — DEXAMETHASONE 4 MG PO TABS
4.0000 mg | ORAL_TABLET | Freq: Four times a day (QID) | ORAL | Status: DC
  Administered 2018-03-11 – 2018-03-14 (×12): 4 mg via ORAL
  Filled 2018-03-11 (×12): qty 1

## 2018-03-11 MED ORDER — BACITRACIN 500 UNIT/GM EX OINT
TOPICAL_OINTMENT | CUTANEOUS | Status: DC
Start: 2018-03-12 — End: 2018-03-11

## 2018-03-11 MED ORDER — PANTOPRAZOLE SODIUM 40 MG PO TBEC
40.0000 mg | DELAYED_RELEASE_TABLET | Freq: Every day | ORAL | Status: DC
  Administered 2018-03-11 – 2018-04-06 (×27): 40 mg via ORAL
  Filled 2018-03-11 (×26): qty 1

## 2018-03-11 MED ORDER — LISINOPRIL 10 MG PO TABS
10.0000 mg | ORAL_TABLET | Freq: Every day | ORAL | Status: DC
  Administered 2018-03-11 – 2018-03-15 (×5): 10 mg via ORAL
  Filled 2018-03-11 (×5): qty 1

## 2018-03-11 MED ORDER — GLUCAGON HCL RDNA (DIAGNOSTIC) 1 MG IJ SOLR
1.00 | INTRAMUSCULAR | Status: DC
Start: ? — End: 2018-03-11

## 2018-03-11 MED ORDER — POLYETHYLENE GLYCOL 3350 17 G PO PACK
17.0000 g | PACK | Freq: Every day | ORAL | Status: DC | PRN
  Administered 2018-03-20 – 2018-04-04 (×2): 17 g via ORAL
  Filled 2018-03-11 (×2): qty 1

## 2018-03-11 MED ORDER — TAB-A-VITE/IRON PO TABS
1.0000 | ORAL_TABLET | Freq: Every day | ORAL | Status: DC
  Administered 2018-03-11 – 2018-04-06 (×27): 1 via ORAL
  Filled 2018-03-11 (×27): qty 1

## 2018-03-11 MED ORDER — HYDRALAZINE HCL 20 MG/ML IJ SOLN
10.00 | INTRAMUSCULAR | Status: DC
Start: ? — End: 2018-03-11

## 2018-03-11 MED ORDER — INSULIN REGULAR HUMAN 100 UNIT/ML IJ SOLN
8.00 | INTRAMUSCULAR | Status: DC
Start: 2018-03-11 — End: 2018-03-11

## 2018-03-11 MED ORDER — ATORVASTATIN CALCIUM 40 MG PO TABS
40.00 | ORAL_TABLET | ORAL | Status: DC
Start: 2018-03-11 — End: 2018-03-11

## 2018-03-11 NOTE — Progress Notes (Signed)
[facility] PMR Admission Coordinator Pre-Admission Assessment  Patient: Bryan [name] is an 77 y.o., male MRN: [id number] DOB: 11/23/1940 Height: _0  (165.1 cm) Weight: 77.1 kg  Insurance Information HMO:     PPO:      PCP:      IPA:      80/20:      OTHER: PRIMARY: Medicare Part A and B      Policy#: [id number]      Subscriber: Patient CM Name:       Phone#:      Fax#:  Pre-Cert#:       Employer:  Benefits:  Phone #: NA     Name: Verified eligibility via [place] on 03/10/18  Eff. Date: Part A: 11/22/06; Part B: 03/23/13     Deduct: $1,364      Out of Pocket Max: NA      Life Max: NA CIR: Covered per Medicare Guidelines once yearly deductible is met      SNF: days 1-20, 100%; days 21-100, 80% Outpatient: 80%     Co-Pay: 20% Home Health: 100%      Co-Pay:  DME: 80%     Co-Pay: 20% Providers: Pt's choice SECONDARY: BCBS      Policy#: [id number]      Subscriber: Patient CM Name:       Phone#:      Fax#:  Pre-Cert#:       Employer:  Benefits:  Phone #:      Name:  Eff. Date:      Deduct:       Out of Pocket Max:       Life Max:  CIR:       SNF:  Outpatient:      Co-Pay:  Home Health:      Co-Pay:  DME:      Co-Pay:   Medicaid Application Date:      Case Manager:  Disability Application Date:       Case Worker:   Emergency Contact Information         Contact Information    Name Relation Home Work Mobile   [place] Spouse [phone]  [phone]      Current Medical History  Patient Admitting Diagnosis: brain tumor resection  History of Present Illness: Pt is a 77 yo male with history of SCC, HTN, HLD, prediabetes, and hypothyroidism. Pt had been experiencing word finding difficulty, confusion, and abnormalities of gait since November of 2019. Pt was taken to the ED on 11/26 after presentation of dragging his left foot and left facial droop. An MRI showed a right insular lesion. Pt underwent surgical management with a right  frontotemporal craniotomy on 03/07/18. Pt has progressed well with therapies but continues to require ongoing intensive therapy needs. CIR has been recommended. Pt is to be admitted to CIR on 03/11/18.   Patient's medical record from [facility] has been reviewed by the rehabilitation admission coordinator and physician.   Past Medical History      Past Medical History:  Diagnosis Date  . Arthritis   . Diabetes mellitus without complication ([place])   . GERD (gastroesophageal reflux disease)   . Hypercholesterolemia   . Hypertension   . Thyroid disease     Family History   family history includes ALS in his father; Diabetes Mellitus II in his mother.  Prior Rehab/Hospitalizations Has the patient had major surgery during 100 days prior to admission? No  Current Medications  Current Outpatient Medications:  .  atorvastatin (LIPITOR) 40 MG tablet, Take 40 mg by mouth daily., Disp: , Rfl:  .  dexamethasone (DECADRON) 4 MG tablet, Take 1 tablet (4 mg total) by mouth daily., Disp: 30 tablet, Rfl: 0 .  lisinopril (PRINIVIL,ZESTRIL) 10 MG tablet, Take 10 mg by mouth daily., Disp: , Rfl:  .  metFORMIN (GLUCOPHAGE) 500 MG tablet, Take 1,000 mg by mouth 2 (two) times daily with a meal., Disp: , Rfl:   Patients Current Diet:      Diet Order    None      Precautions / Restrictions Precautions Precautions: Fall   Has the patient had 2 or more falls or a fall with injury in the past year?No  Prior Activity Level Community (5-7x/wk): active PTA; was retired but enjoys fishing; drove PTA  Prior Functional Level Do you want Prior Function Level of Independence: Independent from other? Self Care: Did the patient need help bathing, dressing, using the toilet or eating?  Independent  Indoor Mobility: Did the patient need assistance with walking from room to room (with or without device)? Independent  Stairs: Did the patient need  assistance with internal or external stairs (with or without device)? Independent  Functional Cognition: Did the patient need help planning regular tasks such as shopping or remembering to take medications? Independent  Home Assistive Devices / Equipment Home Assistive Devices/Equipment: None  Prior Device Use: Indicate devices/aids used by the patient prior to current illness, exacerbation or injury? None of the above  Prior Functional Level   Prior Functional Level Current Functional Level  Bed Mobility  Independent  Mod A  Transfers   Independent  Min A x2  Mobility - Walk/Wheelchair  Independent  Mod A x2  Mobility - Ambulation/Gait  Independent  Mod A x2  Upper Body Dressing  Independent  Max A  Lower Body Dressing  Independent  not formally assessed; on AMPAC 6 Clicks, pt is categorized as requiring "A lot" of assistance  Grooming  Independent  Mod A  Eating/Drinking  Independent  not formally assessed  Toilet Transfer  Independent  not formally assessed  Bladder Continence  continent  continent  Bowel Management   continent  continent  Stair Climbing Independent  NT  Communication  WNL  dysarthric  Memory  WNL  Not formally assessed; A&Ox4  Cooking/Meal Prep   Independent      Housework   Independent   Money Management   Independent   Driving   Independent     Special needs/care consideration BiPAP/CPAP: no CPM: no Continuous Drip IV: no Dialysis: no        Days: NA Life Vest: no Oxygen: no Special Bed: no Trach Size: no Wound Vac (area): no      Location: no Skin: sutures to surgical incision site on head; healed small groin incision                     Bowel mgmt:continent, last BM: 03/09/18 Bladder mgmt: continent, urinal  Diabetic mgmt: yes  Previous Home Environment Living Arrangements: Spouse/significant other  Lives With: Spouse Available Help at Discharge: Family Type of Home: House Home Layout: One level Home Access: Stairs  to enter Entrance Stairs-Rails: None Entrance Stairs-Number of Steps: 1 step then threshold Bathroom Shower/Tub: Multimedia programmer: Standard Bathroom Accessibility: Yes How Accessible: Accessible via walker Home Care Services: No  Discharge Living Setting Plans for Discharge Living Setting: Patient's home, Lives with (  comment)(wife) Type of Home at Discharge: House Discharge Home Layout: One level Discharge Home Access: Stairs to enter Entrance Stairs-Rails: None Entrance Stairs-Number of Steps: 1 step then threshold Discharge Bathroom Shower/Tub: Walk-in shower Discharge Bathroom Toilet: Standard Discharge Bathroom Accessibility: Yes How Accessible: Accessible via walker Does the patient have any problems obtaining your medications?: No  Social/Family/Support Systems Patient Roles: Spouse Contact Information: wife Curt Bears): [phone] Anticipated Caregiver: wife, daughter, and son in law  Anticipated Caregiver's Contact Information: see above Ability/Limitations of Caregiver: Min A Caregiver Availability: 24/7 Discharge Plan Discussed with Primary Caregiver: Yes Is Caregiver In Agreement with Plan?: Yes Does Caregiver/Family have Issues with Lodging/Transportation while Pt is in Rehab?: No  Goals/Additional Needs Patient/Family Goal for Rehab: PT/OT: Supervision, Min A; SLP: Mod I Expected length of stay: 16-20 days Cultural Considerations: [employer] Dietary Needs: mechanical soft, card modified, thin liquids Equipment Needs: TBD Pt/Family Agrees to Admission and willing to participate: Yes Program Orientation Provided & Reviewed with Pt/Caregiver Including Roles  & Responsibilities: Yes(wife)  Barriers to Discharge: Pending chemo/radiation  Patient Condition: I have reviewed medical records from [facility], spoken with CM and RN and the patient and his wife. I discussed via phone details for CIR and gathered all pertinent information for an  inpatient rehabilitation assessment.  Patient will benefit from ongoing PT, OT, and SLP, can actively participate in 3 hours of therapy a day 5 days of the week, and can make measurable gains during the admission.  Patient will also benefit from the coordinated team approach during an Inpatient Acute Rehabilitation admission.  The patient will receive intensive therapy as well as Rehabilitation physician, nursing, social worker, and care management interventions.  Due to safety, skin/wound care, disease management, medical administration, pain management, patient education the patient requires 24 hour a day rehabilitation nursing.  The patient is currently Mod Ax2  with mobility and Mod/Max A with basic ADLs.  Discharge setting and therapy post discharge at home with home health is anticipated.  Patient has agreed to participate in the Acute Inpatient Rehabilitation Program and will admit today 03/11/18.  Preadmission Screen Completed By:  Jhonnie Garner, 03/11/2018 8:59 AM ______________________________________________________________________   Discussed status with Dr. Posey Pronto on 03/10/18 at 5:00PM and received telephone approval for admission today.  Admission Coordinator:  Jhonnie Garner, time 7:56AM/Date 03/11/18   Assessment/Plan: Diagnosis: brain tumor resection  1. Does the need for close, 24 hr/day  Medical supervision in concert with the patient's rehab needs make it unreasonable for this patient to be served in a less intensive setting? Yes  2. Co-Morbidities requiring supervision/potential complications:  SCC, HTN, HLD, prediabetes, and hypothyroidism. 3. Due to bladder management, bowel management, skin/wound care, disease management and patient education, does the patient require 24 hr/day rehab nursing? Yes 4. Does the patient require coordinated care of a physician, rehab nurse, PT (1-2 hrs/day, 5 days/week), OT (1-2 hrs/day, 5 days/week) and SLP (1-2 hrs/day, 5 days/week) to address  physical and functional deficits in the context of the above medical diagnosis(es)? Yes Addressing deficits in the following areas: balance, endurance, locomotion, strength, transferring, bathing, dressing, toileting, cognition, speech, language and psychosocial support 5. Can the patient actively participate in an intensive therapy program of at least 3 hrs of therapy 5 days a week? Yes 6. The potential for patient to make measurable gains while on inpatient rehab is excellent 7. Anticipated functional outcomes upon discharge from inpatients are: supervision and min assist PT, supervision and min assist OT, modified independent SLP 8. Estimated  rehab length of stay to reach the above functional goals is: 10-14 days. 9. Anticipated D/C setting: Home 10. Anticipated post D/C treatments: HH therapy and Home excercise program 11. Overall Rehab/Functional Prognosis: good     RECOMMENDATIONS: This patient's condition is appropriate for continued rehabilitative care in the following setting: CIR Patient has agreed to participate in recommended program. Yes Note that insurance prior authorization may be required for reimbursement for recommended care.  Jhonnie Garner 03/11/2018  Delice Lesch, MD, ABPMR         Revision History    Date/Time User Provider Type Action  03/11/2018 9:00 AM Jamse Arn, MD Physician Sign  03/11/2018 7:56 AM Jhonnie Garner, OT Rehab Admission Coordinator Share  View Details Report

## 2018-03-11 NOTE — H&P (Addendum)
Physical Medicine and Rehabilitation Admission H&P     HPI: Bryan [name] is a 77 year old right handed male history of pre-diabetes, hypothyroidism hypertension.  History taken from chart review and wife.  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  MRI reviewed, showing right frontal tumor.  He was transferred to [facility] for evaluation and intervention on the referral of Dr. Mickeal Skinner and Dr. Tammi Klippel. CT and imaging by report showed a right insular lesion. Initially placed on Decadron therapy. He underwent a arteriogram that showed a hypervascular right mass supplied by the lenticulostriate arteries. 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 [id number]([phone]) 03/07/2018. Maintained on Keppra for seizure prophylaxis. Postoperative course with noted dysarthria and facial weakness. Tolerating a regular diet. Patient requiring min mod assist for mobility. Tolerating a regular diet. Patient was admitted for a conference rehabilitation program.  Review of Systems  Constitutional: Negative for chills and fever.  HENT: Negative for hearing loss.   Eyes: Negative for blurred vision and double vision.  Respiratory: Negative for cough and shortness of breath.   Cardiovascular: Negative for palpitations.  Gastrointestinal: Positive for constipation. Negative for nausea and vomiting.       GERD  Genitourinary: Positive for urgency. Negative for dysuria, flank pain and hematuria.  Musculoskeletal: Positive for myalgias.  Skin: Negative for rash.  Neurological: Positive for dizziness, speech change, focal weakness and headaches. Negative for sensory change.  All other systems reviewed and are negative.  Past Medical History:  Diagnosis Date  . Arthritis   . Diabetes mellitus without complication ([place])   .  GERD (gastroesophageal reflux disease)   . Hypercholesterolemia   . Hypertension   . Thyroid disease    Past Surgical History:  Procedure Laterality Date  . HERNIA REPAIR     Family History  Problem Relation Age of Onset  . Diabetes Mellitus II Mother   . ALS Father    Social History:  reports that he has quit smoking. He has never used smokeless tobacco. He reports current alcohol use of about 1.0 standard drinks of alcohol per week. He reports that he does not use drugs. Allergies: No Known Allergies Medications Prior to Admission  Medication Sig Dispense Refill  . atorvastatin (LIPITOR) 40 MG tablet Take 40 mg by mouth daily.    [name] dexamethasone (DECADRON) 4 MG tablet Take 1 tablet (4 mg total) by mouth daily. 30 tablet 0  . lisinopril (PRINIVIL,ZESTRIL) 10 MG tablet Take 10 mg by mouth daily.    . metFORMIN (GLUCOPHAGE) 500 MG tablet Take 1,000 mg by mouth 2 (two) times daily with a meal.      Drug Regimen Review  Drug regimen was reviewed and remains appropriate with no significant issues identified  Home:     Functional History:    Functional Status:  Mobility:          ADL:    Cognition:      Physical Exam: There were no vitals taken for this visit. Physical Exam  Vitals reviewed. Constitutional: He appears well-developed and well-nourished.  HENT:  Left facial weakness. Craniotomy site clean and dry  Eyes: EOM are normal. Right eye exhibits no discharge. Left eye exhibits no discharge.  Pupils reactive to light  Neck: Normal range of motion. Neck supple. No thyromegaly present.  Cardiovascular: Normal rate, regular rhythm  and normal heart sounds.  Respiratory: Effort normal and breath sounds normal. No respiratory distress.  GI: Soft. Bowel sounds are normal. He exhibits no distension.  Musculoskeletal:     Comments: No edema or tenderness in extremities  Neurological: He is alert.  Makes good eye contact with examiner.  Patient is dysarthric but  intelligible.  Follows commands.  Fair awareness of deficits. Expressive greater than receptive aphasia Motor: Left upper extremity: Shoulder abduction, elbow flexion/extension 4/5, handgrip 2+/5 Left lower extremity: 3/5 proximal distal  Skin: Skin is warm and dry.  See above  Psychiatric: His affect is blunt. His speech is delayed and slurred. He is slowed.     Medical Problem List and Plan: 1.  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 Status post craniotomy 03/07/2018. Patient was to follow up with Dr.Vaslow and Dr. Tammi Klippel on discharge. Taper Decadron every 3 days 2.  DVT Prophylaxis/Anticoagulation: SCDs. 3. Pain Management: Tylenol as needed 4. Mood:  Provide emotional support 5. Neuropsych: This patient is not capable of making decisions on his own behalf. 6. Skin/Wound Care:  Routine skin checks 7. Fluids/Electrolytes/Nutrition:  Routine in and out's with follow-up chemistries 8. Seizure prophylaxis. Keppra 1000 mg twice a day 9. Hypertension. Lisinopril 10 mg daily 10. Prediabetes. Glucophage 1000 mg twice a day. Monitor closely while on tapering steroids 11. Hyperlipidemia. Lipitor   Post Admission Physician Evaluation: 1. Preadmission assessment reviewed and changes made below. 2. Functional deficits secondary  to right insular mass. 3. Patient is admitted to receive collaborative, interdisciplinary care between the physiatrist, rehab nursing staff, and therapy team. 4. Patient's level of medical complexity and substantial therapy needs in context of that medical necessity cannot be provided at a lesser intensity of care such as a SNF. 5. Patient has experienced substantial functional loss from his/her baseline which was documented above under the "Functional History" and "Functional Status" headings.  Judging by the patient's diagnosis, physical exam, and functional history, the patient has potential for  functional progress which will result in measurable gains while on inpatient rehab.  These gains will be of substantial and practical use upon discharge  in facilitating mobility and self-care at the household level. 6. Physiatrist will provide 24 hour management of medical needs as well as oversight of the therapy plan/treatment and provide guidance as appropriate regarding the interaction of the two. 7. 24 hour rehab nursing will assist with bladder management, safety, skin/wound care, disease management, pain management and patient education  and help integrate therapy concepts, techniques,education, etc. 8. PT will assess and treat for/with: Lower extremity strength, range of motion, stamina, balance, functional mobility, safety, adaptive techniques and equipment, wound care, coping skills, pain control, education. Goals are: Supervision/min a. 9. OT will assess and treat for/with: ADL's, functional mobility, safety, upper extremity strength, adaptive techniques and equipment, wound mgt, ego support, and community reintegration.   Goals are: Min a. Therapy may not proceed with showering this patient. 10. SLP will assess and treat for/with: Speech, language, swallowing, cognition.  Goals are: Supervision. 11. Case Management and Social Worker will assess and treat for psychological issues and discharge planning. 12. Team conference will be held weekly to assess progress toward goals and to determine barriers to discharge. 13. Patient will receive at least 3 hours of therapy per day at least 5 days per week. 14. ELOS: 16-19 days.       15. Prognosis:  good  I have personally performed a face to  face diagnostic evaluation, including, but not limited to relevant history and physical exam findings, of this patient and developed relevant assessment and plan.  Additionally, I have reviewed and concur with the physician assistant's documentation above.  Delice Lesch, MD, ABPMR Lavon Paganini Angiulli,  PA-C 03/11/2018

## 2018-03-11 NOTE — Evaluation (Signed)
Speech Language Pathology Assessment and Plan  Patient Details  Name: Bryan [name] MRN: [id number] Date of Birth: Jan 20, 1941  SLP Diagnosis: Aphasia  Rehab Potential: Good ELOS: TBD    Today's Date: 03/11/2018 SLP Individual Time: 4665-9935 SLP Individual Time Calculation (min): 47 min   Problem List:  Patient Active Problem List   Diagnosis Date Noted  . Brain tumor ([place]) 03/11/2018  . Postoperative pain   . Seizure prophylaxis   . Prediabetes   . Dyslipidemia   . Brain mass 02/16/2018  . Diabetes mellitus without complication ([place])   . Hypercholesterolemia   . Hypertension    Past Medical History:  Past Medical History:  Diagnosis Date  . Arthritis   . Diabetes mellitus without complication ([place])   . GERD (gastroesophageal reflux disease)   . Hypercholesterolemia   . Hypertension   . Thyroid disease    Past Surgical History:  Past Surgical History:  Procedure Laterality Date  . HERNIA REPAIR      Assessment / Plan / Recommendation Clinical Impression Pt presents s/p brain tumor resection at [facility]. Pt is right handed 77 year old who present with word finding deficits at the phrase level. 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 He is dysfluent but is responsive to verbal choices, self-aware of verbal errors and is able to read with Mod A at the phrase level. Phrase level sentence completion best facilitates word finding during this evaluation. Time spent providing education to daughter and wife. No obvious cognitive deficits were apparent.    Skilled Therapeutic Interventions          Skilled treatment focused on yes/no questions (pt not accurate), word finding is best facilitated  Written phrase completion.    SLP Assessment  Patient will need skilled [facility] during CIR admission    Recommendations  Patient destination: Home Follow up Recommendations: Outpatient SLP Equipment Recommended:  None recommended by SLP    SLP Frequency 3 to 5 out of 7 days   SLP Duration  SLP Intensity  SLP Treatment/Interventions TBD  Minumum of 1-2 x/day, 30 to 90 minutes  Speech/Language facilitation;Patient/family education    Pain Pain Assessment Pain Scale: 0-10 Pain Score: 0-No pain  Prior Functioning Cognitive/Linguistic Baseline: Within functional limits Type of Home: House  Lives With: Spouse Available Help at Discharge: Family  Short Term Goals: Week 1: SLP Short Term Goal 1 (Week 1): Pt will name familiar objects with ~ 90% accuracy and Min A cues.  SLP Short Term Goal 2 (Week 1): Pt will express wants and needs with Mod A cues in 7 out of 10 opportunities.  SLP Short Term Goal 3 (Week 1): Pt will read at the phrase level with ~ 75% accuracy.  SLP Short Term Goal 4 (Week 1): Pt will self-monitor and self-correct verbal errors in 7 out of 10 opportunities with Mod A cues.   Refer to Care Plan for Long Term Goals  Recommendations for other services: None   Discharge Criteria: Patient will be discharged from SLP if patient refuses treatment 3 consecutive times without medical reason, if treatment goals not met, if there is a change in medical status, if patient makes no progress towards goals or if patient is discharged from hospital.  The above assessment, treatment plan, treatment alternatives and goals were discussed and mutually agreed upon: by patient and by family  Papa [place]field 03/11/2018, 3:19 PM

## 2018-03-11 NOTE — Progress Notes (Addendum)
19Pt A/Ox4 slurred speech. Sutures right crani, no noted drainage (OTA). BUE/groin bruising. LUE weakness, unable to grip. Pt resides with spouse. LBM 03/11/18, CONT B/B. Pt has been active up until craniotomy. Upper dentures

## 2018-03-11 NOTE — Plan of Care (Signed)
  Problem: Consults Goal: RH STROKE PATIENT EDUCATION Description See Patient Education module for education specifics  Outcome: Not Progressing Goal: Nutrition Consult-if indicated Outcome: Not Progressing   Problem: RH BOWEL ELIMINATION Goal: RH STG MANAGE BOWEL WITH ASSISTANCE Description STG Manage Bowel with min-mod Assistance.  Outcome: Not Progressing Goal: RH STG MANAGE BOWEL W/MEDICATION W/ASSISTANCE Description STG Manage Bowel with Medication with min-mod Assistance.  Outcome: Not Progressing   Problem: RH BLADDER ELIMINATION Goal: RH STG MANAGE BLADDER WITH ASSISTANCE Description STG Manage Bladder With min-mod  Assistance  Outcome: Not Progressing Goal: RH STG MANAGE BLADDER WITH MEDICATION WITH ASSISTANCE Description STG Manage Bladder With Medication With min-mod Assistance.  Outcome: Not Progressing Goal: RH STG MANAGE BLADDER WITH EQUIPMENT WITH ASSISTANCE Description STG Manage Bladder With Equipment With min-mod  Assistance  Outcome: Not Progressing   Problem: RH SKIN INTEGRITY Goal: RH STG SKIN FREE OF INFECTION/BREAKDOWN Outcome: Not Progressing Goal: RH STG MAINTAIN SKIN INTEGRITY WITH ASSISTANCE Description STG Maintain Skin Integrity With min Assistance.  Outcome: Not Progressing   Problem: RH SAFETY Goal: RH STG ADHERE TO SAFETY PRECAUTIONS W/ASSISTANCE/DEVICE Description STG Adhere to Safety Precautions With min-mod Assistance/Device.  Outcome: Not Progressing   Problem: RH COGNITION-NURSING Goal: RH STG USES MEMORY AIDS/STRATEGIES W/ASSIST TO PROBLEM SOLVE Description STG Uses Memory Aids/Strategies With min Assistance to Problem Solve.  Outcome: Not Progressing   Problem: RH PAIN MANAGEMENT Goal: RH STG PAIN MANAGED AT OR BELOW PT'S PAIN GOAL Outcome: Not Progressing   Problem: RH KNOWLEDGE DEFICIT Goal: RH STG INCREASE KNOWLEDGE OF DIABETES Outcome: Not Progressing Goal: RH STG INCREASE KNOWLEDGE OF DYSPHAGIA/FLUID  INTAKE Outcome: Not Progressing Goal: RH STG INCREASE KNOWLEGDE OF HYPERLIPIDEMIA Outcome: Not Progressing Goal: RH STG INCREASE KNOWLEDGE OF STROKE PROPHYLAXIS Outcome: Not Progressing   Problem: RH Vision Goal: RH LTG Vision (Specify) Outcome: Not Progressing  Not progressing, new admit.

## 2018-03-12 ENCOUNTER — Inpatient Hospital Stay (HOSPITAL_COMMUNITY): Payer: Medicare Other | Admitting: Occupational Therapy

## 2018-03-12 ENCOUNTER — Inpatient Hospital Stay (HOSPITAL_COMMUNITY): Payer: Medicare Other | Admitting: Physical Therapy

## 2018-03-12 ENCOUNTER — Inpatient Hospital Stay (HOSPITAL_COMMUNITY): Payer: Medicare Other | Admitting: Speech Pathology

## 2018-03-12 DIAGNOSIS — E785 Hyperlipidemia, unspecified: Secondary | ICD-10-CM

## 2018-03-12 DIAGNOSIS — Z298 Encounter for other specified prophylactic measures: Secondary | ICD-10-CM

## 2018-03-12 DIAGNOSIS — D496 Neoplasm of unspecified behavior of brain: Secondary | ICD-10-CM

## 2018-03-12 MED ORDER — LEVETIRACETAM 100 MG/ML PO SOLN
1000.0000 mg | Freq: Two times a day (BID) | ORAL | Status: DC
  Administered 2018-03-12 – 2018-03-13 (×2): 1000 mg via ORAL
  Filled 2018-03-12 (×2): qty 10

## 2018-03-12 NOTE — Evaluation (Signed)
Occupational Therapy Assessment and Plan  Patient Details  Name: Bryan [name] MRN: [id number] Date of Birth: 22-Aug-1940  OT Diagnosis: cognitive deficits, hemiplegia affecting non-dominant side and muscle weakness (generalized) Rehab Potential: Rehab Potential (ACUTE ONLY): Good ELOS: 2.5-3 weeks   Today's Date: 03/12/2018 OT Individual Time: 6789-3810 OT Individual Time Calculation (min): 76 min     Problem List:  Patient Active Problem List   Diagnosis Date Noted  . Brain tumor ([place]) 03/11/2018  . Postoperative pain   . Seizure prophylaxis   . Prediabetes   . Dyslipidemia   . Brain mass 02/16/2018  . Diabetes mellitus without complication ([place])   . Hypercholesterolemia   . Hypertension     Past Medical History:  Past Medical History:  Diagnosis Date  . Arthritis   . Diabetes mellitus without complication ([place])   . GERD (gastroesophageal reflux disease)   . Hypercholesterolemia   . Hypertension   . Thyroid disease    Past Surgical History:  Past Surgical History:  Procedure Laterality Date  . HERNIA REPAIR      Assessment & Plan Clinical Impression: Bryan [name] is a 77 year old right handed male history of pre-diabetes, hypothyroidism hypertension.  History taken from chart review and wife.  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  MRI reviewed, showing right frontal tumor.  He was transferred to [facility] for evaluation and intervention on the referral of Bryan [name] and Bryan [name]. CT and imaging by report showed a right insular lesion. Initially placed on Decadron therapy. He underwent a arteriogram that showed a hypervascular right mass supplied by the lenticulostriate arteries. 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 Maintained on  Keppra for seizure prophylaxis. Postoperative course with noted dysarthria and facial weakness. Tolerating a regular diet. Patient requiring min mod assist for mobility. Tolerating a regular diet. Patient was admitted for a conference rehabilitation program.  Patient currently requires mod with basic self-care skills secondary to muscle weakness and muscle paralysis, decreased cardiorespiratoy endurance, abnormal tone, unbalanced muscle activation and decreased coordination, decreased attention to left, decreased awareness, decreased problem solving and decreased safety awareness and decreased sitting balance, decreased standing balance and decreased postural control.  Prior to hospitalization, patient could complete BADLs with independent .  Patient will benefit from skilled intervention to increase independence with basic self-care skills prior to discharge home with spouse.  Anticipate patient will require 24 hour supervision and minimal physical assistance and follow up home health.  OT - End of Session Endurance Deficit: Yes OT Assessment Rehab Potential (ACUTE ONLY): Good OT Barriers to Discharge: Medical stability;Behavior OT Patient demonstrates impairments in the following area(s): Balance;Behavior;Perception;Cognition;Safety;Endurance;Sensory;Motor;Skin Integrity OT Basic ADL's Functional Problem(s): Eating;Grooming;Bathing;Dressing;Toileting OT Advanced ADL's Functional Problem(s): Simple Meal Preparation OT Transfers Functional Problem(s): Toilet;Tub/Shower OT Additional Impairment(s): Fuctional Use of Upper Extremity OT Plan OT Intensity: Minimum of 1-2 x/day, 45 to 90 minutes OT Duration/Estimated Length of Stay: 3-3.5 weeks OT Treatment/Interventions: Balance/vestibular training;Community reintegration;Disease mangement/prevention;Functional electrical stimulation;Neuromuscular re-education;Patient/family education;Self Care/advanced ADL retraining;Splinting/orthotics;Therapeutic  Exercise;UE/LE Coordination activities;Wheelchair propulsion/positioning;Visual/perceptual remediation/compensation;UE/LE Strength taining/ROM;Therapeutic Activities;Psychosocial support;Pain management;Functional mobility training;DME/adaptive equipment instruction;Discharge planning;Cognitive remediation/compensation OT Self Feeding Anticipated Outcome(s): Supervision/cuing  OT Basic Self-Care Anticipated Outcome(s): Supervision-Min A  OT Toileting Anticipated Outcome(s): Min A  OT Bathroom Transfers Anticipated Outcome(s): Min A  OT Recommendation Recommendations for Other Services: Therapeutic Recreation consult Therapeutic  Recreation Interventions: Pet therapy Patient destination: Home Follow Up Recommendations: Home health OT Equipment Recommended: To be determined  Skilled Therapeutic Intervention Skilled OT session completed with focus on initial evaluation, education on OT role/POC, and establishment of patient-centered goals. Pt greeted in w/c, often verbalizing "well" when asked open ended questions, but did well with yes/no throughout session. Able to consistently follow instruction. He completed bathing, dressing, oral care, and toileting tasks during session. Pt presented with Lt hemiparesis with decreased Lt attention. Exhibited proximal strength however required [facility] for using limb to wash Rt side of body due to decreased gross motor control. 0/5 in Lt hand. Sit<stand with Mod A and Lt knee blocked due to buckling. OT placed L UE onto sink for weightbearing. He required cuing and assist for elevating LB garments on Lt side. Min A for dynamic sitting balance while doffing socks/shoes and when donning footwear again after washing. Assist required to bring L LE into figure 4 position and to maintain position while donning footwear. Min vcs for attention to task/awareness and also for decreasing impulsive tendencies (especially during sit<stands). Also completed squat pivot toilet transfer  with Mod A. He elevated pants in standing and voided bladder while seated with steady assist for sitting balance. Hygiene completed in sitting and standing positions with balance support and facilitation for L UE positioning. Afterwards he returned to w/c. Handwashing/oral care completed with [facility] for integrating Lt. He wanted to remain in w/c at end of tx. Pt left with all needs within reach and safety belt fastened.   OT Evaluation Precautions/Restrictions  Precautions Precautions: Fall Precaution Comments: expressive aphasia, L hemiplgia, impulsive Restrictions Weight Bearing Restrictions: No General Chart Reviewed: Yes Family/Caregiver Present: No Vital Signs Therapy Vitals Temp: 97.9 F (36.6 C) Temp Source: Oral Pulse Rate: 77 Resp: 18 BP: 134/79 Patient Position (if appropriate): Sitting Oxygen Therapy SpO2: 98 % O2 Device: Room Air Pain: No s/s pain during session    Home Living/Prior [place] expects to be discharged to:: Private residence Living Arrangements: Spouse/significant other Available Help at Discharge: Family Type of Home: House Home Access: Stairs to enter Technical brewer of Steps: 1 step then threshold Entrance Stairs-Rails: None Home Layout: One level Bathroom Shower/Tub: Multimedia programmer: Standard Bathroom Accessibility: Yes Additional Comments: Information obtained from chart due to pts expressive aphasia   Lives With: Spouse IADL History Occupation: Retired Leisure and Hobbies: Fishing  Prior Function Level of Independence: Independent with basic ADLs, Independent with gait(per chart)  Able to Take Stairs?: Yes Driving: Yes ADL ADL Eating: Not assessed Grooming: Moderate assistance Where Assessed-Grooming: Wheelchair, Sitting at sink Upper Body Bathing: Moderate assistance Where Assessed-Upper Body Bathing: Wheelchair, Sitting at sink Lower Body Bathing: Maximal assistance Where  Assessed-Lower Body Bathing: Wheelchair, Sitting at sink, Standing at sink Upper Body Dressing: Moderate assistance Where Assessed-Upper Body Dressing: Sitting at sink, Wheelchair Lower Body Dressing: Maximal assistance Where Assessed-Lower Body Dressing: Standing at sink, Sitting at sink, Wheelchair Toileting: Moderate assistance Where Assessed-Toileting: Glass blower/designer: Moderate assistance Toilet Transfer Method: Squat pivot Toilet Transfer Equipment: Energy manager: Not assessed Vision Baseline Vision/History: Wears glasses Wears Glasses: Reading only Patient Visual Report: No change from baseline Additional Comments: Able to visually track to Lt to retrieve ADL items without cues. Also initiated scanning to Lt for washing Lt side of body Perception  Perception: Impaired Inattention/Neglect: Does not attend to left side of body (decreased Lt attention and requires cues for preventing L UE from dangling  and pulling LB garments up on Lt side)  Praxis Praxis: Intact Cognition Overall Cognitive Status: Impaired/Different from baseline Arousal/Alertness: Awake/alert Orientation Level: Person;Place;Situation(Able to accurately state name, that we were in hospital, and that he had surgery on his head. The other orientation information obtained when given options of 2) Person: Oriented Place: Oriented Situation: Oriented Year: 2019 Month: December Day of Week: Correct Memory: Impaired Immediate Memory Recall: Sock;Bed Memory Recall: Sock;Blue Memory Recall Sock: With Cue Memory Recall Blue: Without Cue Attention: Sustained Sustained Attention: Appears intact Awareness: Impaired Problem Solving: Impaired Behaviors: Impulsive Safety/Judgment: Impaired Comments: Impulsive with sit<stands and when engaging in dynamic balance tasks   Sensation Sensation Light Touch: Appears Intact(Pt reports L UE feels like "concrete" but able to discern light touch in  limb via gross assessment ) Proprioception: Impaired Detail Proprioception Impaired Details: Impaired LUE Coordination Gross Motor Movements are Fluid and Coordinated: No Fine Motor Movements are Fluid and Coordinated: No Coordination and Movement Description: Lt hemiplegia  Finger Nose Finger Test: Able to completely grossly using Lt hand, WNL Rt Motor  Motor Motor: Hemiplegia Motor - Skilled Clinical Observations: L sided hemiple UE>LE  Mobility    Mod A squat pivot toilet transfer + Mod A sit<stand for LB self care completed at sink  Trunk/Postural Assessment  Cervical Assessment Cervical Assessment: Within Functional Limits Thoracic Assessment Thoracic Assessment: Within Functional Limits Lumbar Assessment Lumbar Assessment: Within Functional Limits Postural Control Postural Control: Deficits on evaluation (difficultly with trunk control while sitting unsupported on toilet) Balance Balance Balance Assessed: Yes Dynamic Sitting Balance Dynamic Sitting - Level of Assistance: 4: Min assist(Perihygiene completion while sitting unsupported on toilet) Dynamic Standing Balance Dynamic Standing - Level of Assistance: 3: Mod assist(Elevating pants over hips while standing at sink) Extremity/Trunk Assessment RUE Assessment RUE Assessment: Within Functional Limits LUE Assessment LUE Assessment: Exceptions to [facility] Active Range of Motion (AROM) Comments: active ranges at shoulder (about 80 degrees sh flexion) and elbow LUE Body System: Neuro Brunstrum levels for arm and hand: Arm;Hand Brunstrum level for arm: Stage II Synergy is developing Brunstrum level for hand: Stage I Flaccidity(0/5 in hand)     Refer to Care Plan for Long Term Goals  Recommendations for other services: Therapeutic Recreation  Pet therapy   Discharge Criteria: Patient will be discharged from OT if patient refuses treatment 3 consecutive times without medical reason, if treatment goals not met, if there is a  change in medical status, if patient makes no progress towards goals or if patient is discharged from hospital.  The above assessment, treatment plan, treatment alternatives and goals were discussed and mutually agreed upon: by patient  Skeet Simmer 03/12/2018, 4:33 PM

## 2018-03-12 NOTE — Progress Notes (Signed)
Bryan [name] is a 77 y.o. male admitted for CIR yesterday with functional deficits with left-sided weakness and dysarthria secondary to a right insular mass.  Patient is status post craniotomy on March 07, 2018.   Past Medical History:  Diagnosis Date  . Arthritis   . Diabetes mellitus without complication ([place])   . GERD (gastroesophageal reflux disease)   . Hypercholesterolemia   . Hypertension   . Thyroid disease      Subjective: No new complaints. No new problems.  Marked expressive aphasia  Objective: Vital signs in last 24 hours: Temp:  [97.4 F (36.3 C)-98.5 F (36.9 C)] 98.5 F (36.9 C) (12/21 0423) Pulse Rate:  [58-87] 87 (12/21 0824) Resp:  [18-19] 18 (12/21 0824) BP: (131-165)/(75-86) 131/81 (12/21 0824) SpO2:  [97 %-99 %] 98 % (12/21 0824) Weight:  [77.7 kg] 77.7 kg (12/20 1209) Weight change:  Last BM Date: 03/11/18  Intake/Output from previous day: 12/20 0701 - 12/21 0700 In: -  Out: 1200 [Urine:1200]  Patient Vitals for the past 24 hrs:  BP Temp Temp src Pulse Resp SpO2 Height Weight  03/12/18 0824 131/81 - - 87 18 98 % - -  03/12/18 0423 137/77 98.5 F (36.9 C) Oral 63 18 97 % - -  03/11/18 1952 (!) 165/75 97.7 F (36.5 C) Oral (!) 58 - 98 % - -  03/11/18 1209 (!) 146/86 (!) 97.4 F (36.3 C) Oral (!) 59 19 99 % 5\' 5"  (1.651 m) 77.7 kg   Lab Results  Component Value Date   HGBA1C 6.4 (H) 02/16/2018     Physical Exam General: No apparent distress   HEENT: not dry Lungs: Normal effort. Lungs clear to auscultation, no crackles or wheezes. Cardiovascular: Regular rate and rhythm, no edema Abdomen: S/NT/ND; BS(+) Musculoskeletal:  unchanged Neurological: No new neurological deficits with left-sided weakness and marked aphasia Wounds: Surgical scar right frontal parietal scalp with staples in place Skin: clear  Mental state: Alert, oriented, cooperative    Lab Results: BMET    Component Value Date/Time   NA 136 02/16/2018 0546   K 4.4  02/16/2018 0546   CL 107 02/16/2018 0546   CO2 23 02/16/2018 0546   GLUCOSE 192 (H) 02/16/2018 0546   BUN 19 02/16/2018 0546   CREATININE 1.11 02/16/2018 0546   CALCIUM 9.0 02/16/2018 0546   GFRNONAA >60 02/16/2018 0546   GFRAA >60 02/16/2018 0546   CBC    Component Value Date/Time   WBC 6.4 02/16/2018 0546   RBC 3.91 (L) 02/16/2018 0546   HGB 11.7 (L) 02/16/2018 0546   HCT 36.1 (L) 02/16/2018 0546   PLT 211 02/16/2018 0546   MCV 92.3 02/16/2018 0546   MCH 29.9 02/16/2018 0546   MCHC 32.4 02/16/2018 0546   RDW 12.4 02/16/2018 0546    Medications: I have reviewed the patient's current medications.  Assessment/Plan:  Functional deficits with left-sided weakness secondary to right insular mass.  Status post craniotomy  Essential hypertension.  Continue lisinopril Seizure prophylaxis continue Keppra Dyslipidemia continue atorvastatin   Length of stay, days: 1  Marletta Lor , MD 03/12/2018, 10:43 AM

## 2018-03-12 NOTE — Evaluation (Signed)
Physical Therapy Assessment and Plan  Patient Details  Name: Bryan [name] MRN: [id number] Date of Birth: 09-Aug-1940  PT Diagnosis: Abnormal posture, Abnormality of gait, Hemiplegia non-dominant, Impaired cognition and Muscle weakness Rehab Potential: Good ELOS: 14-18 days    Today's Date: 03/12/2018 PT Individual Time: 0900-1005 AND 1105-1200 PT Individual Time Calculation (min): 65 min  AND 55 min   Problem List:  Patient Active Problem List   Diagnosis Date Noted  . Brain tumor ([place]) 03/11/2018  . Postoperative pain   . Seizure prophylaxis   . Prediabetes   . Dyslipidemia   . Brain mass 02/16/2018  . Diabetes mellitus without complication ([place])   . Hypercholesterolemia   . Hypertension     Past Medical History:  Past Medical History:  Diagnosis Date  . Arthritis   . Diabetes mellitus without complication ([place])   . GERD (gastroesophageal reflux disease)   . Hypercholesterolemia   . Hypertension   . Thyroid disease    Past Surgical History:  Past Surgical History:  Procedure Laterality Date  . HERNIA REPAIR      Assessment & Plan Clinical Impression: Patient is a 77 year old right handed male history of pre-diabetes, hypothyroidism hypertension.  History taken from chart review and wife.  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  MRI reviewed, showing right frontal tumor.  He was transferred to [facility] for evaluation and intervention on the referral of Dr. Mickeal Skinner and Dr. Tammi Klippel. CT and imaging by report showed a right insular lesion. Initially placed on Decadron therapy. He underwent a arteriogram that showed a hypervascular right mass supplied by the lenticulostriate arteries. 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 DGLOVFIE([phone]) 03/07/2018. Maintained on Keppra  for seizure prophylaxis. Postoperative course with noted dysarthria and facial weakness.  Patient transferred to [facility] on 03/11/2018 .   Patient currently requires mod with mobility secondary to muscle weakness and muscle joint tightness, abnormal tone and unbalanced muscle activation, decreased attention to left, decreased attention, decreased awareness, decreased problem solving, decreased safety awareness, decreased memory and delayed processing and decreased sitting balance, decreased standing balance, decreased postural control, hemiplegia and decreased balance strategies.  Prior to hospitalization, patient was independent  with mobility and lived with Spouse in a House home.  Home access is 1 step then thresholdStairs to enter.  Patient will benefit from skilled PT intervention to maximize safe functional mobility, minimize fall risk and decrease caregiver burden for planned discharge home with 24 hour supervision.  Anticipate patient will benefit from follow up [place] at discharge.  PT - End of Session Activity Tolerance: Tolerates 10 - 20 min activity with multiple rests Endurance Deficit: Yes PT Assessment Rehab Potential (ACUTE/IP ONLY): Good PT Barriers to Discharge: Inaccessible home environment;Decreased caregiver support;Medical stability PT Patient demonstrates impairments in the following area(s): Balance;Behavior;Endurance;Motor;Perception;Safety;Sensory;Skin Integrity PT Transfers Functional Problem(s): Bed Mobility;Bed to Chair;Car;Furniture PT Locomotion Functional Problem(s): Ambulation;Wheelchair Mobility;Stairs PT Plan PT Intensity: Minimum of 1-2 x/day ,45 to 90 minutes PT Frequency: 5 out of 7 days PT Duration Estimated Length of Stay: 14-18 days  PT Treatment/Interventions: Ambulation/gait training;Balance/vestibular training;Cognitive remediation/compensation;Community reintegration;Discharge planning;Disease management/prevention;DME/adaptive equipment instruction;Functional  electrical stimulation;Neuromuscular re-education;Functional mobility training;Pain management;Patient/family education;Skin care/wound management;Psychosocial support;Stair training;Splinting/orthotics;Therapeutic Activities;Therapeutic Exercise;UE/LE Strength taining/ROM;UE/LE Coordination activities;Visual/perceptual remediation/compensation;Wheelchair propulsion/positioning PT Transfers Anticipated Outcome(s): Supervision assist with LRAD  PT Locomotion Anticipated Outcome(s): Ambulatory at [place]  level with LRAD  PT Recommendation Recommendations for Other Services: Therapeutic Recreation consult Therapeutic Recreation Interventions: Outing/community reintergration;Stress management;Kitchen group Follow Up Recommendations: Home health PT Patient destination: Home Equipment Recommended: Wheelchair (measurements);Wheelchair cushion (measurements);Rolling walker with 5" wheels  Skilled Therapeutic Intervention Pt received supine in bed and agreeable to PT. Supine>sit transfer with min assist and min cues for attention to the LUE. Lower body dressing at EOB with min assist for safety and to prevent LOB to the L when trying to thread pants over L foot. Urination in standing at toilet with mod assist from PT to prevent L lateral LOB.   PT instructed patient in PT Evaluation and initiated treatment intervention; see below for results. PT educated patient in [place], rehab potential, rehab goals, and discharge recommendations. Car transfer with mod assist from PT and moderate cues for sequencing and safety. Patient returned to room and left sitting in [facility] with call bell in reach and all needs met.    Session 2.  Pt received sitting in WC and agreeable to PT. PT instructed pt in WC mobility x 129f with min assist and R hemi technique. Attempted to use BLE, but unable to attend to LLE to propel chair.  Gait training with RW x 1562fand mod-max assist from PT. Max multimodal cues for attention to  the LLE, posture, and step length. PT instructed pt in Berg balance test. Patient demonstrates increased fall risk as noted by score of   19/56 on Berg Balance Scale.  (<36= high risk for falls, close to 100%; 37-45 significant >80%; 46-51 moderate >50%; 52-55 lower >25%). Patient returned to room and reports need to urination. Sit<>stnad with mod assist for pt to use urinal in standing. Mod assist from PT to maintain balance while urinating, PT required to perform clothing management before and after urination. Pt left sitting in WC with call bell in reach and all needs met.        PT Evaluation Precautions/Restrictions Precautions Precautions: Fall Precaution Comments: expressive aphasia, L hemiplgia, impulsive General   Vital SignsTherapy Vitals Pulse Rate: 87 Resp: 18 BP: 131/81 Patient Position (if appropriate): Lying Oxygen Therapy SpO2: 98 % O2 Device: Room Air Pain Pain Assessment Pain Scale: 0-10 Pain Score: 0-No pain Home Living/Prior Functioning Home Living Available Help at Discharge: Family Type of Home: House Home Access: Stairs to enter [employer] Steps: 1 step then threshold Entrance Stairs-Rails: None Home Layout: One level Bathroom Shower/Tub: WaMultimedia programmerStProgrammer, systemsYes  Lives With: Spouse Prior Function Level of Independence: Independent with gait  Able to Take Stairs?: Yes Vocation: Retired Comments: history obtained per;  pt limited due to expressive aphasia and no family present  Vision/Perception  Vision - Assessment Additional Comments: mild inattention to the L side withfunctional movement. able to track in all quadrants with mild additional head movements.  Perception Perception: Impaired Inattention/Neglect: Does not attend to left side of body Praxis Praxis: Intact  Cognition Overall Cognitive Status: Impaired/Different from baseline Memory: Impaired Awareness: Impaired Problem  Solving: Impaired Safety/Judgment: Impaired Comments: impulsive movements with poor awareness of LE deficits  Sensation Sensation Light Touch: Impaired by gross assessment Proprioception: Impaired by gross assessment Additional Comments: light touch and propriceptive deficits noted through functional movement, difficult to assess formally due to aphasia Coordination Gross Motor Movements are Fluid and Coordinated: No Fine Motor Movements are Fluid and Coordinated: No Coordination and Movement Description: mild dysmeria in on the L, UE>LE.  Motor  Motor Motor: Hemiplegia  Motor - Skilled Clinical Observations: L sided hemiple UE>LE   Mobility Bed Mobility Bed Mobility: Rolling Right;Rolling Left;Supine to Sit;Sit to Supine Rolling Right: Minimal Assistance - Patient > 75% Rolling Left: Minimal Assistance - Patient > 75% Supine to Sit: Minimal Assistance - Patient > 75% Sit to Supine: Minimal Assistance - Patient > 75% Transfers Transfers: Sit to Stand;Stand Pivot Transfers Sit to Stand: Minimal Assistance - Patient > 75% Stand Pivot Transfers: Moderate Assistance - Patient 50 - 74% Locomotion  Gait Ambulation: Yes Gait Assistance: Moderate Assistance - Patient 50-74% Gait Distance (Feet): 150 Feet Assistive device: None;1 person hand held assist Gait Gait: Yes Gait Pattern: Decreased step length - left;Decreased hip/knee flexion - left;Narrow base of support;Left hip hike Stairs / Additional Locomotion Stairs: Yes Stairs Assistance: Moderate Assistance - Patient 50 - 74% Stair Management Technique: One rail Right Number of Stairs: 4 Wheelchair Mobility Wheelchair Mobility: No  Trunk/Postural Assessment  Cervical Assessment Cervical Assessment: Within Functional Limits Thoracic Assessment Thoracic Assessment: Within Functional Limits Lumbar Assessment Lumbar Assessment: Within Functional Limits Postural Control Postural Control: Deficits on evaluation Righting  Reactions: delayed  Balance Balance Balance Assessed: Yes Static Sitting Balance Static Sitting - Level of Assistance: 5: Stand by assistance Dynamic Sitting Balance Dynamic Sitting - Level of Assistance: 4: Min assist Static Standing Balance Static Standing - Level of Assistance: 4: Min assist Dynamic Standing Balance Dynamic Standing - Level of Assistance: 3: Mod assist Extremity Assessment      RLE Assessment RLE Assessment: Within Functional Limits LLE Assessment LLE Assessment: Exceptions to [facility] General Strength Comments: grossly 4-/5 proximal to distal with functional movement.     Refer to Care Plan for Long Term Goals  Recommendations for other services: Neuropsych and Therapeutic Recreation  Kitchen group, Stress management and Outing/community reintegration  Discharge Criteria: Patient will be discharged from PT if patient refuses treatment 3 consecutive times without medical reason, if treatment goals not met, if there is a change in medical status, if patient makes no progress towards goals or if patient is discharged from hospital.  The above assessment, treatment plan, treatment alternatives and goals were discussed and mutually agreed upon: by patient  [name] 03/12/2018, 10:11 AM

## 2018-03-12 NOTE — Plan of Care (Signed)
  Problem: RH SAFETY Goal: RH STG ADHERE TO SAFETY PRECAUTIONS W/ASSISTANCE/DEVICE Description: STG Adhere to Safety Precautions With min/mod Assistance/Device. Outcome: Progressing   

## 2018-03-13 ENCOUNTER — Inpatient Hospital Stay (HOSPITAL_COMMUNITY): Payer: Medicare Other

## 2018-03-13 ENCOUNTER — Inpatient Hospital Stay (HOSPITAL_COMMUNITY): Payer: Medicare Other | Admitting: Occupational Therapy

## 2018-03-13 DIAGNOSIS — R7303 Prediabetes: Secondary | ICD-10-CM

## 2018-03-13 LAB — CBC
HEMATOCRIT: 35.6 % — AB (ref 39.0–52.0)
Hemoglobin: 12.3 g/dL — ABNORMAL LOW (ref 13.0–17.0)
MCH: 31.9 pg (ref 26.0–34.0)
MCHC: 34.6 g/dL (ref 30.0–36.0)
MCV: 92.2 fL (ref 80.0–100.0)
PLATELETS: 234 10*3/uL (ref 150–400)
RBC: 3.86 MIL/uL — ABNORMAL LOW (ref 4.22–5.81)
RDW: 12.7 % (ref 11.5–15.5)
WBC: 11 10*3/uL — ABNORMAL HIGH (ref 4.0–10.5)
nRBC: 0 % (ref 0.0–0.2)

## 2018-03-13 LAB — COMPREHENSIVE METABOLIC PANEL
ALT: 32 U/L (ref 0–44)
AST: 19 U/L (ref 15–41)
Albumin: 3.2 g/dL — ABNORMAL LOW (ref 3.5–5.0)
Alkaline Phosphatase: 36 U/L — ABNORMAL LOW (ref 38–126)
Anion gap: 14 (ref 5–15)
BUN: 37 mg/dL — ABNORMAL HIGH (ref 8–23)
CO2: 18 mmol/L — ABNORMAL LOW (ref 22–32)
Calcium: 8.8 mg/dL — ABNORMAL LOW (ref 8.9–10.3)
Chloride: 98 mmol/L (ref 98–111)
Creatinine, Ser: 0.99 mg/dL (ref 0.61–1.24)
GFR calc Af Amer: >60 mL/min (ref >60)
GFR calc non Af Amer: >60 mL/min (ref >60)
Glucose, Bld: 218 mg/dL — ABNORMAL HIGH (ref 70–99)
Potassium: 4.8 mmol/L (ref 3.5–5.1)
Sodium: 130 mmol/L — ABNORMAL LOW (ref 135–145)
Total Bilirubin: 0.7 mg/dL (ref 0.3–1.2)
Total Protein: 6 g/dL — ABNORMAL LOW (ref 6.5–8.1)

## 2018-03-13 LAB — GLUCOSE, CAPILLARY: Glucose-Capillary: 214 mg/dL — ABNORMAL HIGH (ref 70–99)

## 2018-03-13 LAB — TSH: TSH: 0.512 u[IU]/mL (ref 0.350–4.500)

## 2018-03-13 MED ORDER — INSULIN ASPART 100 UNIT/ML ~~LOC~~ SOLN
0.0000 [IU] | Freq: Three times a day (TID) | SUBCUTANEOUS | Status: DC
  Administered 2018-03-13 – 2018-03-14 (×2): 3 [IU] via SUBCUTANEOUS
  Administered 2018-03-14: 1 [IU] via SUBCUTANEOUS
  Administered 2018-03-14: 3 [IU] via SUBCUTANEOUS
  Administered 2018-03-15: 2 [IU] via SUBCUTANEOUS
  Administered 2018-03-15: 1 [IU] via SUBCUTANEOUS
  Administered 2018-03-15: 3 [IU] via SUBCUTANEOUS
  Administered 2018-03-16: 5 [IU] via SUBCUTANEOUS
  Administered 2018-03-16 – 2018-03-17 (×3): 3 [IU] via SUBCUTANEOUS
  Administered 2018-03-17 (×2): 2 [IU] via SUBCUTANEOUS
  Administered 2018-03-18: 3 [IU] via SUBCUTANEOUS
  Administered 2018-03-18: 2 [IU] via SUBCUTANEOUS
  Administered 2018-03-18 – 2018-03-19 (×2): 3 [IU] via SUBCUTANEOUS
  Administered 2018-03-19: 7 [IU] via SUBCUTANEOUS
  Administered 2018-03-19: 2 [IU] via SUBCUTANEOUS
  Administered 2018-03-20: 5 [IU] via SUBCUTANEOUS
  Administered 2018-03-20 (×2): 2 [IU] via SUBCUTANEOUS
  Administered 2018-03-21: 5 [IU] via SUBCUTANEOUS
  Administered 2018-03-21 – 2018-03-22 (×2): 3 [IU] via SUBCUTANEOUS
  Administered 2018-03-22 – 2018-03-23 (×4): 2 [IU] via SUBCUTANEOUS
  Administered 2018-03-23: 3 [IU] via SUBCUTANEOUS
  Administered 2018-03-24 – 2018-03-25 (×4): 2 [IU] via SUBCUTANEOUS
  Administered 2018-03-25: 1 [IU] via SUBCUTANEOUS
  Administered 2018-03-25: 3 [IU] via SUBCUTANEOUS
  Administered 2018-03-26: 1 [IU] via SUBCUTANEOUS
  Administered 2018-03-26: 3 [IU] via SUBCUTANEOUS
  Administered 2018-03-26 – 2018-03-27 (×3): 1 [IU] via SUBCUTANEOUS
  Administered 2018-03-27 – 2018-03-28 (×3): 2 [IU] via SUBCUTANEOUS
  Administered 2018-03-28: 1 [IU] via SUBCUTANEOUS
  Administered 2018-03-29: 3 [IU] via SUBCUTANEOUS
  Administered 2018-03-29: 1 [IU] via SUBCUTANEOUS
  Administered 2018-03-30 (×2): 2 [IU] via SUBCUTANEOUS
  Administered 2018-03-31 – 2018-04-01 (×5): 1 [IU] via SUBCUTANEOUS
  Administered 2018-04-02: 2 [IU] via SUBCUTANEOUS
  Administered 2018-04-03: 1 [IU] via SUBCUTANEOUS
  Administered 2018-04-04 – 2018-04-06 (×3): 2 [IU] via SUBCUTANEOUS

## 2018-03-13 MED ORDER — SODIUM CHLORIDE 0.9 % IV SOLN
INTRAVENOUS | Status: DC
  Administered 2018-03-13 – 2018-03-17 (×6): via INTRAVENOUS

## 2018-03-13 MED ORDER — LEVETIRACETAM 100 MG/ML PO SOLN
250.0000 mg | Freq: Two times a day (BID) | ORAL | Status: DC
  Administered 2018-03-13: 250 mg via ORAL
  Filled 2018-03-13: qty 5

## 2018-03-13 NOTE — Progress Notes (Signed)
Occupational Therapy Session Note  Patient Details  Name: Bryan [name] MRN: [id number] Date of Birth: 10-27-1940  Today's Date: 03/13/2018 OT Individual Time: 0700-0800 OT Individual Time Calculation (min): 60 min    Short Term Goals: Week 1:  OT Short Term Goal 1 (Week 1): Pt will maintain sitting balance on toilet or BSC with supervision assist while voiding  OT Short Term Goal 2 (Week 1): Pt will complete sit<stand during LB self care with Min A  OT Short Term Goal 3 (Week 1): Pt will initiate 1 limb protection strategy during B/D for 1 OT session   Skilled Therapeutic Interventions/Progress Updates:    OT intervention with focus on BADL retraining, functional transfers, standing balance, task initiation, sequencing, attention to L, activity tolerance, and safety awareness to increase independence with BADLs. Pt resting in bed upon arrival and agreeable to participating in therapy.  Pt with limited verbal expression and follows one step commands with 75% accuracy.  Pt answer yes/no questions with 75% accuracy.  Pt requires max multimodal cues for positioning of LUE when not engaged in functional tasks. LUE with proximal>distal movement but not engaging in functional tasks.  See below for assist level for bathing/dressing.  Pt required assistance opening containers and cutting food up but able to self feed after setup. Pt remained seated in w/c with all needs within reach and belt alarm activated.   Therapy Documentation Precautions:  Precautions Precautions: Fall Precaution Comments: expressive aphasia, L hemiplgia, impulsive Restrictions Weight Bearing Restrictions: No Pain: Pt with no s/s of pain ADL: ADL Eating: Not assessed Grooming: Moderate assistance Where Assessed-Grooming: Wheelchair, Sitting at sink Upper Body Bathing: Moderate assistance Where Assessed-Upper Body Bathing: Wheelchair, Sitting at sink Lower Body Bathing: Maximal assistance Where Assessed-Lower Body  Bathing: Wheelchair, Sitting at sink, Standing at sink Upper Body Dressing: Moderate assistance Where Assessed-Upper Body Dressing: Sitting at sink, Wheelchair Lower Body Dressing: Maximal assistance Where Assessed-Lower Body Dressing: Standing at sink, Sitting at sink, Wheelchair  Therapy/Group: Individual Therapy  Leroy Libman 03/13/2018, 7:43 AM

## 2018-03-13 NOTE — Progress Notes (Signed)
Occupational Therapy Session Note  Patient Details  Name: Bryan [name] MRN: [id number] Date of Birth: [birth year]  Today's Date: 03/13/2018 OT Individual Time: 0488-8916 OT Individual Time Calculation (min): 74 min   Short Term Goals: Week 1:  OT Short Term Goal 1 (Week 1): Pt will maintain sitting balance on toilet or BSC with supervision assist while voiding  OT Short Term Goal 2 (Week 1): Pt will complete sit<stand during LB self care with Min A  OT Short Term Goal 3 (Week 1): Pt will initiate 1 limb protection strategy during B/D for 1 OT session   Skilled Therapeutic Interventions/Progress Updates:    Pt greeted in w/c with dtr present. He gestured towards bathroom, and stand pivot<elevated toilet completed with Mod A. Pt continent of B+B while on toilet. Able to complete perihygiene with Mod A for standing balance and 2nd helper checking for thoroughness. While seated at sink, Lt NMR with pt activating proximal musculature to reach for ear-hair trimmer and also razor. Pt completed ear/nose hair trimming and shaving tasks with Min A and vcs for Lt sided thoroughness. After pt with Lt sided twitching observed in mouth, neck, and Lt arm, with pt squeezing OT's hand (using Lt). RN notified and in to assess. Symptoms absolved after 2-3 minutes and pt continued tx without additional symptoms. He was next taken to dayroom and educated dtr on UE self ROM exercises that she can help facilitate in room with him. At end of session pt left in dayroom with dtr, listening to music while engaging in UE self ROM. RN made aware of pts position.    Therapy Documentation Precautions:  Precautions Precautions: Fall Precaution Comments: expressive aphasia, L hemiplgia, impulsive Restrictions Weight Bearing Restrictions: No Vital Signs: Therapy Vitals Pulse Rate: 86 BP: (!) 176/86 Pain: Pt held up 2 fingers and gestured towards incision site. RN in during session to administer pain medication  Pain  Assessment Pain Scale: Faces Pain Score: 3  Faces Pain Scale: Hurts little more Pain Type: Acute pain Pain Location: Head Pain Descriptors / Indicators: Aching Pain Onset: Gradual Pain Intervention(s): Medication (See eMAR) ADL: ADL Eating: Not assessed Grooming: Moderate assistance Where Assessed-Grooming: Wheelchair, Sitting at sink Upper Body Bathing: Moderate assistance Where Assessed-Upper Body Bathing: Wheelchair, Sitting at sink Lower Body Bathing: Maximal assistance Where Assessed-Lower Body Bathing: Wheelchair, Sitting at sink, Standing at sink Upper Body Dressing: Moderate assistance Where Assessed-Upper Body Dressing: Sitting at sink, Wheelchair Lower Body Dressing: Maximal assistance Where Assessed-Lower Body Dressing: Standing at sink, Sitting at sink, Wheelchair Toileting: Moderate assistance Where Assessed-Toileting: Glass blower/designer: Moderate assistance Toilet Transfer Method: Squat pivot Toilet Transfer Equipment: Energy manager: Not assessed :     Therapy/Group: Individual Therapy  Leanny Moeckel A Ainsley Deakins 03/13/2018, 12:39 PM

## 2018-03-13 NOTE — Progress Notes (Signed)
Physical Therapy Session Note  Patient Details  Name: Bryan [name] MRN: [id number] Date of Birth: [birth year]  Today's Date: 03/13/2018 PT Individual Time: 1500-1600 PT Individual Time Calculation (min): 60 min   Short Term Goals: Week 1:  PT Short Term Goal 1 (Week 1): Pt will perfrom sit<>stnad with min assist consistently  PT Short Term Goal 2 (Week 1): Pt will ambuate 110ft with min assist and LRAD  PT Short Term Goal 3 (Week 1): Pt will propell WC 137ft with supervision assist  PT Short Term Goal 4 (Week 1): Pt wil perform bed<>WC transfer with min assist consistently   Skilled Therapeutic Interventions/Progress Updates:    Pt supine in bed upon PT arrival, agreeable to therapy tx and denies pain. Pt transferred to sitting EOB with min assist, verbal cues for techniques and attention to L . Pt performed stand pivot to w/c with mod assist, cueing for sequencing and tactile cues to move L LE. Pt transported to the gym. Pt performed min assist stand pivot to the right. Pt performed x 10 sit<>stands this session without UE support, min assist working on strength and NMR. Pt wokred on dynamic standing balance while tossing horseshoes, x 2 trials with min guard assist. Pt worked on dynamic standing balance, L attention and L hip flexion to perform toe taps to colored tape without UE support and to perform L LE kicking cones activity without UE support. Pt ambulated x 40 ft with RW and min-mod assist, verbal cues for L foot clearance and increased L step length. Pt transported back to room and left seated with needs in reach and chair alarm set.   Therapy Documentation Precautions:  Precautions Precautions: Fall Precaution Comments: expressive aphasia, L hemiplgia, impulsive Restrictions Weight Bearing Restrictions: No    Therapy/Group: Individual Therapy  Netta Corrigan, PT, DPT 03/13/2018, 3:48 PM

## 2018-03-13 NOTE — Progress Notes (Addendum)
Patient noted by family to have facial twitching and shaking of hands. RN checked on patient and symptoms had subsided within a couple minutes. Later patient's family again noted twitching and L hand pill rolling. Vital signs taken, within usual range for patient. MD notified and orders given to continue monitoring.  Family called RN in to observe 2 minute symptoms of facial twitching and progressive twitching on the left arm. Patient unable to verbalize during event but was able to respond to commands. Family recorded 7 episodes during 7a to 7 pm shift. MD contacted and orders given.

## 2018-03-13 NOTE — IPOC Note (Addendum)
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   Hospital Problems: Active Problems:   Brain tumor ([place])   Postoperative pain   Seizure prophylaxis   Prediabetes   Dyslipidemia     Functional Problem List: Nursing Behavior, Bladder, Bowel, Medication Management, Pain, Safety, Skin Integrity  PT Balance, Behavior, Endurance, Motor, Perception, Safety, Sensory, Skin Integrity  OT Balance, Behavior, Perception, Cognition, Safety, Endurance, Sensory, Motor, Skin Integrity  SLP Linguistic  TR   Activity tolerance, functional mobility, balance, cognition, safety, pain       Basic ADL's: OT Eating, Grooming, Bathing, Dressing, Toileting     Advanced  ADL's: OT Simple Meal Preparation     Transfers: PT Bed Mobility, Bed to Chair, Car, Manufacturing systems engineer, Metallurgist: PT Ambulation, Emergency planning/management officer, Stairs     Additional Impairments: OT Fuctional Use of Upper Extremity  SLP Communication expression    TR  community integration    Anticipated Outcomes Item Anticipated Outcome  Self Feeding Supervision/cuing   Swallowing      Basic self-care  Supervision-Min A   Toileting  Min A    Bathroom Transfers Min A   Bowel/Bladder  Cont B/B, LBM 03/11/18  Transfers  Supervision assist with LRAD   Locomotion  Ambulatory at [place] assist level with LRAD   Communication  Min A   Cognition     Pain  Denies pain, tolerable 5/6  Safety/Judgment  Refrain from falls/injuries, call light within reachm, bed alarm, proper footwear    Therapy Plan: PT Intensity: Minimum of 1-2 x/day ,45 to 90 minutes PT Frequency: 5 out of 7 days PT Duration Estimated Length of Stay: 14-18 days  OT Intensity: Minimum of 1-2 x/day, 45 to 90 minutes OT Duration/Estimated Length of Stay: 2.5-3 weeks  SLP Intensity: Minumum of 1-2 x/day, 30 to 90 minutes SLP  Frequency: 3 to 5 out of 7 days SLP Duration/Estimated Length of Stay: TBD  TR Duration/ELOS:  Discharge 1/15 TR Frequency:  Min 1 time >45 minutes for community reintegration during LOS     Team Interventions: Nursing Interventions Patient/Family Education, Skin Care/Wound Management  PT interventions Ambulation/gait training, Training and development officer, Cognitive remediation/compensation, Community reintegration, Discharge planning, Disease management/prevention, DME/adaptive equipment instruction, Functional electrical stimulation, Neuromuscular re-education, Functional mobility training, Pain management, Patient/family education, Skin care/wound management, Psychosocial support, Stair training, Splinting/orthotics, Therapeutic Activities, Therapeutic Exercise, UE/LE Strength taining/ROM, UE/LE Coordination activities, Visual/perceptual remediation/compensation, Wheelchair propulsion/positioning  OT Interventions Training and development officer, Community reintegration, Disease mangement/prevention, Technical sales engineer stimulation, Neuromuscular re-education, Patient/family education, Self Care/advanced ADL retraining, Splinting/orthotics, Therapeutic Exercise, UE/LE Coordination activities, Wheelchair propulsion/positioning, Visual/perceptual remediation/compensation, UE/LE Strength taining/ROM, Therapeutic Activities, Psychosocial support, Pain management, Functional mobility training, DME/adaptive equipment instruction, Discharge planning, Cognitive remediation/compensation  SLP Interventions Speech/Language facilitation, Patient/family education  TR Interventions   Balance/Vestibular training, functional mobility, therapeutic activities, UE/LE strength/coordination, w/c mobility, community reintegration, pt/family education, adaptive equipment instruction/use, discharge planning, psychosocial support  SW/CM Interventions Discharge Planning, Psychosocial Support, Patient/Family Education    Barriers to Discharge MD  Medical stability  Nursing      PT Inaccessible home environment, Decreased caregiver support, Medical stability    OT Medical stability, Behavior    SLP      SW       Team Discharge Planning: Destination: PT-Home ,OT- Home , SLP-Home Projected Follow-up: PT-Home health PT, OT-  Home health OT, SLP-Outpatient SLP Projected Equipment Needs: PT-Wheelchair (measurements),  Wheelchair cushion (measurements), Rolling walker with 5" wheels, OT- To be determined, SLP-None recommended by SLP Equipment Details: PT- , OT-  Patient/family involved in discharge planning: PT- Patient,  OT-Patient, SLP-Patient, Family member/caregiver  MD ELOS: 16-19 days. Medical Rehab Prognosis:  Good Assessment: 77 year old right handed male history of pre-diabetes, hypothyroidism hypertension.  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  MRI reviewed, showing right frontal tumor.  He was transferred to [facility] for evaluation and intervention on the referral of Dr. Mickeal Skinner and Dr. Tammi Klippel. CT and imaging by report showed a right insular lesion. Initially placed on Decadron therapy. He underwent a arteriogram that showed a hypervascular right mass supplied by the lenticulostriate arteries. 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 Tommi Rumps [phone]) 03/07/2018. Postoperative course with noted dysarthria and facial weakness, endurance deficits, motor deficits. Patient requiring Min/Mod assist for mobility. Tolerating a regular diet. Patient with resulting functional deficits   See Team Conference Notes for weekly updates to the plan of care

## 2018-03-13 NOTE — Progress Notes (Signed)
Bryan [name] is a 77 y.o. male who is admitted for CIR with functional deficits and left-sided weakness and aphasia secondary to right insular mass.  He is status post craniotomy on December 16  Subjective: No new complaints. No new problems.  Limited due to severe expressive a aphasia  Objective: Vital signs in last 24 hours: Temp:  [97.4 F (36.3 C)-97.9 F (36.6 C)] 97.4 F (36.3 C) (12/22 0503) Pulse Rate:  [66-86] 86 (12/22 0943) Resp:  [16-18] 16 (12/22 0503) BP: (134-176)/(63-86) 176/86 (12/22 0943) SpO2:  [98 %] 98 % (12/22 0503) Weight change:  Last BM Date: 03/13/18  Intake/Output from previous day: 12/21 0701 - 12/22 0700 In: 480 [P.O.:480] Out: 700 [Urine:700] Last cbgs: CBG (last 3)  No results for input(s): GLUCAP in the last 72 hours.  Patient Vitals for the past 24 hrs:  BP Temp Temp src Pulse Resp SpO2  03/13/18 0943 (!) 176/86 - - 86 - -  03/13/18 0503 (!) 155/77 (!) 97.4 F (36.3 C) Oral 66 16 98 %  03/12/18 1956 (!) 155/63 97.9 F (36.6 C) Oral 69 18 98 %  03/12/18 1548 134/79 97.9 F (36.6 C) Oral 77 18 98 %     Physical Exam General: No apparent distress   HEENT: not dry Lungs: Normal effort. Lungs clear to auscultation, no crackles or wheezes. Cardiovascular: Regular rate and rhythm, no edema Abdomen: S/NT/ND; BS(+) Musculoskeletal:  unchanged Neurological: No new neurological deficits with left-sided weakness and severe expressive aphasia Wounds: N/A    Skin: clear;  surgical scar right frontoparietal scalp with sutures still in place Mental state: Alert, oriented, cooperative    Lab Results: BMET    Component Value Date/Time   NA 136 02/16/2018 0546   K 4.4 02/16/2018 0546   CL 107 02/16/2018 0546   CO2 23 02/16/2018 0546   GLUCOSE 192 (H) 02/16/2018 0546   BUN 19 02/16/2018 0546   CREATININE 1.11 02/16/2018 0546   CALCIUM 9.0 02/16/2018 0546   GFRNONAA >60 02/16/2018 0546   GFRAA >60 02/16/2018 0546   CBC    Component  Value Date/Time   WBC 6.4 02/16/2018 0546   RBC 3.91 (L) 02/16/2018 0546   HGB 11.7 (L) 02/16/2018 0546   HCT 36.1 (L) 02/16/2018 0546   PLT 211 02/16/2018 0546   MCV 92.3 02/16/2018 0546   MCH 29.9 02/16/2018 0546   MCHC 32.4 02/16/2018 0546   RDW 12.4 02/16/2018 0546    Medications: I have reviewed the patient's current medications.  Assessment/Plan:  Functional deficits with left hemiparesis and aphasia secondary to right insular mass.  Status post craniotomy.  Consider suture removal in a.m. Essential hypertension.  Continue lisinopril Seizure prophylaxis continue Keppra Diabetes mellitus/Decadron therapy.  Will monitor blood sugars and treat SSI if needed    Length of stay, days: 2  Marletta Lor , MD 03/13/2018, 10:06 AM

## 2018-03-14 ENCOUNTER — Telehealth: Payer: Self-pay | Admitting: *Deleted

## 2018-03-14 ENCOUNTER — Inpatient Hospital Stay (HOSPITAL_COMMUNITY): Payer: Medicare Other | Admitting: Physical Therapy

## 2018-03-14 ENCOUNTER — Inpatient Hospital Stay (HOSPITAL_COMMUNITY): Payer: Medicare Other

## 2018-03-14 DIAGNOSIS — E8809 Other disorders of plasma-protein metabolism, not elsewhere classified: Secondary | ICD-10-CM

## 2018-03-14 DIAGNOSIS — R739 Hyperglycemia, unspecified: Secondary | ICD-10-CM

## 2018-03-14 DIAGNOSIS — R569 Unspecified convulsions: Secondary | ICD-10-CM

## 2018-03-14 DIAGNOSIS — T380X5A Adverse effect of glucocorticoids and synthetic analogues, initial encounter: Secondary | ICD-10-CM

## 2018-03-14 DIAGNOSIS — E871 Hypo-osmolality and hyponatremia: Secondary | ICD-10-CM

## 2018-03-14 DIAGNOSIS — E46 Unspecified protein-calorie malnutrition: Secondary | ICD-10-CM

## 2018-03-14 LAB — CBC WITH DIFFERENTIAL/PLATELET
Band Neutrophils: 0 %
Basophils Absolute: 0 10*3/uL (ref 0.0–0.1)
Basophils Relative: 0 %
Blasts: 0 %
EOS PCT: 0 %
Eosinophils Absolute: 0 10*3/uL (ref 0.0–0.5)
HCT: 31.8 % — ABNORMAL LOW (ref 39.0–52.0)
Hemoglobin: 10.9 g/dL — ABNORMAL LOW (ref 13.0–17.0)
LYMPHS ABS: 1.1 10*3/uL (ref 0.7–4.0)
Lymphocytes Relative: 11 %
MCH: 31.1 pg (ref 26.0–34.0)
MCHC: 34.3 g/dL (ref 30.0–36.0)
MCV: 90.6 fL (ref 80.0–100.0)
MYELOCYTES: 0 %
Metamyelocytes Relative: 0 %
Monocytes Absolute: 0.5 10*3/uL (ref 0.1–1.0)
Monocytes Relative: 5 %
Neutro Abs: 8.3 10*3/uL — ABNORMAL HIGH (ref 1.7–7.7)
Neutrophils Relative %: 84 %
Other: 0 %
Platelets: 200 10*3/uL (ref 150–400)
Promyelocytes Relative: 0 %
RBC: 3.51 MIL/uL — ABNORMAL LOW (ref 4.22–5.81)
RDW: 12.5 % (ref 11.5–15.5)
WBC: 9.9 10*3/uL (ref 4.0–10.5)
nRBC: 0 % (ref 0.0–0.2)
nRBC: 0 /100 WBC

## 2018-03-14 LAB — COMPREHENSIVE METABOLIC PANEL
ALT: 29 U/L (ref 0–44)
AST: 18 U/L (ref 15–41)
Albumin: 2.7 g/dL — ABNORMAL LOW (ref 3.5–5.0)
Alkaline Phosphatase: 34 U/L — ABNORMAL LOW (ref 38–126)
Anion gap: 11 (ref 5–15)
BUN: 30 mg/dL — ABNORMAL HIGH (ref 8–23)
CHLORIDE: 101 mmol/L (ref 98–111)
CO2: 22 mmol/L (ref 22–32)
CREATININE: 0.87 mg/dL (ref 0.61–1.24)
Calcium: 8.5 mg/dL — ABNORMAL LOW (ref 8.9–10.3)
GFR calc non Af Amer: >60 mL/min (ref >60)
Glucose, Bld: 247 mg/dL — ABNORMAL HIGH (ref 70–99)
Potassium: 4.6 mmol/L (ref 3.5–5.1)
Sodium: 134 mmol/L — ABNORMAL LOW (ref 135–145)
Total Bilirubin: 0.5 mg/dL (ref 0.3–1.2)
Total Protein: 5.2 g/dL — ABNORMAL LOW (ref 6.5–8.1)

## 2018-03-14 LAB — GLUCOSE, CAPILLARY
Glucose-Capillary: 135 mg/dL — ABNORMAL HIGH (ref 70–99)
Glucose-Capillary: 218 mg/dL — ABNORMAL HIGH (ref 70–99)
Glucose-Capillary: 220 mg/dL — ABNORMAL HIGH (ref 70–99)
Glucose-Capillary: 244 mg/dL — ABNORMAL HIGH (ref 70–99)

## 2018-03-14 MED ORDER — DEXAMETHASONE 4 MG PO TABS
4.0000 mg | ORAL_TABLET | Freq: Three times a day (TID) | ORAL | Status: DC
  Administered 2018-03-14 – 2018-03-17 (×9): 4 mg via ORAL
  Filled 2018-03-14 (×9): qty 1

## 2018-03-14 MED ORDER — LEVETIRACETAM 100 MG/ML PO SOLN
1000.0000 mg | Freq: Two times a day (BID) | ORAL | Status: DC
  Administered 2018-03-14: 1000 mg via ORAL
  Filled 2018-03-14: qty 10

## 2018-03-14 MED ORDER — LEVETIRACETAM 100 MG/ML PO SOLN
1500.0000 mg | Freq: Two times a day (BID) | ORAL | Status: DC
  Administered 2018-03-14 – 2018-03-25 (×22): 1500 mg via ORAL
  Filled 2018-03-14 (×22): qty 15

## 2018-03-14 MED ORDER — DIAZEPAM 5 MG PO TABS
5.0000 mg | ORAL_TABLET | Freq: Once | ORAL | Status: AC
  Administered 2018-03-14: 5 mg via ORAL
  Filled 2018-03-14: qty 1

## 2018-03-14 MED ORDER — LEVETIRACETAM 100 MG/ML PO SOLN
750.0000 mg | Freq: Once | ORAL | Status: AC
  Administered 2018-03-14: 750 mg via ORAL
  Filled 2018-03-14: qty 10

## 2018-03-14 MED ORDER — PRO-STAT SUGAR FREE PO LIQD
30.0000 mL | Freq: Two times a day (BID) | ORAL | Status: DC
  Administered 2018-03-14 – 2018-04-06 (×47): 30 mL via ORAL
  Filled 2018-03-14 (×47): qty 30

## 2018-03-14 NOTE — Telephone Encounter (Signed)
Called lm for wife to return call to schedule new patient appointment for Bryan [name] approximately around 03/21/2018. Pending call back,.

## 2018-03-14 NOTE — Progress Notes (Signed)
Bryan [name] PHYSICAL MEDICINE & REHABILITATION PROGRESS NOTE  Subjective/Complaints: Patient seen sitting up in his chair this morning working with therapies.  Therapies ordered 3 seizures while in therapies.  He is slowed.  ROS: Limited due to cognition  Objective: Vital Signs: Blood pressure (!) 143/68, pulse 66, temperature 98.2 F (36.8 C), temperature source Oral, resp. rate 20, height 5\' 5"  (1.651 m), weight 77.7 kg, SpO2 98 %. Ct Head Wo Contrast  Result Date: 03/13/2018 CLINICAL DATA:  Seizure.  Craniotomy for mass 03/07/2018. EXAM: CT HEAD WITHOUT CONTRAST TECHNIQUE: Contiguous axial images were obtained from the base of the skull through the vertex without intravenous contrast. COMPARISON:  MRI 02/16/2018.  CT 02/15/2018. 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 Some fluid and blood products in the post resection cavity as expected. Small amount of subdural fluid, air and blood at the site of the craniotomy as expected. Mild mass effect with right-to-left shift of 4 mm as seen previously. No unexpected hemorrhage or mass effect. No sign ischemic infarction. No hydrocephalus. Vascular: There is atherosclerotic calcification of the major vessels at the base of the brain. Skull: Right pterional craniotomy as above. Sinuses/Orbits: Chronic left maxillary sinusitis. Other sinuses are clear. Orbits are negative. 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 Expected postoperative findings. Mild persistent mass effect with right-to-left shift of 4 mm. Electronically Signed   By: Nelson Chimes M.D.   On: 03/13/2018 20:17   Recent Labs    03/13/18 1624 03/14/18 0535  WBC 11.0* 9.9  HGB 12.3* 10.9*  HCT 35.6* 31.8*  PLT 234 200   Recent Labs    03/13/18 1624 03/14/18 0535  NA 130* 134*  K 4.8 4.6  CL 98 101  CO2 18* 22  GLUCOSE  218* 247*  BUN 37* 30*  CREATININE 0.99 0.87  CALCIUM 8.8* 8.5*    Physical Exam: BP (!) 143/68 (BP Location: Left Arm)   Pulse 66   Temp 98.2 F (36.8 C) (Oral)   Resp 20   Ht 5\' 5"  (1.651 m)   Wt 77.7 kg   SpO2 98%   BMI 28.51 kg/m  Constitutional: He appears well-developed and well-nourished.  NAD. HENT: Left facial weakness. Craniotomy site clean and dry  Eyes:  EOMI.  No discharge. Cardiovascular: Normal rate, regular rhythm.  No JVD. Respiratory: Effort normal and breath sounds normal.  GI: Bowel sounds are normal. He exhibits no distension.  Musculoskeletal: No edema or tenderness in extremities  Neurological: He is alert.  Makes good eye contact with examiner.  Patient is dysarthric but intelligible.  Follows commands.  Fair awareness of deficits. Expressive greater than receptive aphasia Motor: Left upper extremity: Shoulder abduction, elbow flexion/extension 4/5, handgrip 2+/5, limited participation Left lower extremity: 3/5 proximal distal , limited due to participation Skin: Skin is warm and dry.  See above  Psychiatric: His affect is blunt. His speech is delayed and slurred. He is slowed.   Assessment/Plan: 1. 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  Physiatrist is providing close team supervision and 24 hour management of active medical problems listed below.  Physiatrist and rehab team continue to assess barriers to discharge/monitor patient progress toward functional and medical goals  Care Tool:  Bathing    Body parts bathed by patient: Chest, Abdomen,  Front perineal area, Right upper leg, Left upper leg, Face, Buttocks, Right lower leg, Left lower leg   Body parts bathed by helper: Right arm, Left arm     Bathing assist Assist Level: Minimal Assistance - Patient > 75%     Upper Body Dressing/Undressing Upper  body dressing   What is the patient wearing?: Hospital gown only    Upper body assist Assist Level: Minimal Assistance - Patient > 75%    Lower Body Dressing/Undressing Lower body dressing      What is the patient wearing?: Underwear/pull up, Pants     Lower body assist Assist for lower body dressing: Minimal Assistance - Patient > 75%     Toileting Toileting    Toileting assist Assist for toileting: Moderate Assistance - Patient 50 - 74%     Transfers Chair/bed transfer  Transfers assist     Chair/bed transfer assist level: Moderate Assistance - Patient 50 - 74%     Locomotion Ambulation   Ambulation assist      Assist level: Minimal Assistance - Patient > 75% Assistive device: Walker-rolling Max distance: 40 ft   Walk 10 feet activity   Assist     Assist level: Minimal Assistance - Patient > 75% Assistive device: Walker-rolling   Walk 50 feet activity   Assist    Assist level: Moderate Assistance - Patient - 50 - 74% Assistive device: Hand held assist    Walk 150 feet activity   Assist    Assist level: Moderate Assistance - Patient - 50 - 74% Assistive device: Hand held assist    Walk 10 feet on uneven surface  activity   Assist Walk 10 feet on uneven surfaces activity did not occur: Safety/medical concerns         Wheelchair     Assist Will patient use wheelchair at discharge?: Yes Type of Wheelchair: Manual    Wheelchair assist level: Minimal Assistance - Patient > 75% Max wheelchair distance: 129ft    Wheelchair 50 feet with 2 turns activity    Assist        Assist Level: Minimal Assistance - Patient > 75%   Wheelchair 150 feet activity     Assist     Assist Level: Supervision/Verbal cueing      Medical Problem List and Plan: 1.  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 Status post craniotomy 03/07/2018. Patient was to follow up with  Dr.Vaslow and Dr. Tammi Klippel on discharge.   Continue CIR  Decadron decreased to 4 mg every 8 hours on 12/23, continue taper every 3 days 2.  DVT Prophylaxis/Anticoagulation: SCDs. 3. Pain Management: Tylenol as needed 4. Mood:  Provide emotional support 5. Neuropsych: This patient is not capable of making decisions on his own behalf. 6. Skin/Wound Care:  Routine skin checks 7. Fluids/Electrolytes/Nutrition:  Routine in and out's  8. Seizure prophylaxis. Keppra 1000 mg twice a day  Breakthrough seizures, will speak to neurology 9. Hypertension. Lisinopril 10 mg daily  Elevated, but improving on 4/23 10.  Steroid-induced hyperglycemia on prediabetes. Glucophage 1000 mg twice a day. Monitor closely while on tapering steroids  Elevated on 12/23 11. Hyperlipidemia. Lipitor 12.  Hyponatremia  Sodium 134 on 12/23  Continue to monitor 13.  Hypoalbuminemia  Supplement initiated on 12/23 14.  Acute blood loss anemia  Hemoglobin 10.9 on 12/23  Continue to monitor  LOS: 3 days A FACE TO FACE EVALUATION WAS PERFORMED  Babbie Dondlinger [name] 03/14/2018,  11:01 AM  

## 2018-03-14 NOTE — Progress Notes (Signed)
Inpatient Diabetes Program Recommendations  AACE/ADA: New Consensus Statement on Inpatient Glycemic Control (2019)  Target Ranges:  Prepandial:   less than 140 mg/dL      Peak postprandial:   less than 180 mg/dL (1-2 hours)      Critically ill patients:  140 - 180 mg/dL  Results for XAI, FRERKING (MRN [id number]) as of 03/14/2018 10:35  Ref. Range 03/13/2018 16:46 03/14/2018 00:17 03/14/2018 06:20  Glucose-Capillary Latest Ref Range: 70 - 99 mg/dL 214 (H) 218 (H) 220 (H)    Review of Glycemic Control  Diabetes history: DM2 Outpatient Diabetes medications: Metformin 1000 mg BID Current orders for Inpatient glycemic control: Novolog 0-9 units TID with meals, Metformin 1000 mg BID; Decadron 4mg  Q6H  Inpatient Diabetes Program Recommendations:  Insulin - Basal: If Decadron is continued, please consider ordering Lantus 5 units Q24H.  Thanks, Barnie Alderman, RN, MSN, CDE Diabetes Coordinator Inpatient Diabetes Program [phone] (Team Pager from 8am to 5pm)

## 2018-03-14 NOTE — Progress Notes (Signed)
Speech Language Pathology Daily Session Note  Patient Details  Name: Bryan [name] MRN: [id number] Date of Birth: 01/30/1941  Today's Date: 03/14/2018 SLP Individual Time: 1330-1400 SLP Individual Time Calculation (min): 30 min  Short Term Goals: Week 1: SLP Short Term Goal 1 (Week 1): Pt will name familiar objects with ~ 90% accuracy and Min A cues.  SLP Short Term Goal 2 (Week 1): Pt will express wants and needs with Mod A cues in 7 out of 10 opportunities.  SLP Short Term Goal 3 (Week 1): Pt will read at the phrase level with ~ 75% accuracy.  SLP Short Term Goal 4 (Week 1): Pt will self-monitor and self-correct verbal errors in 7 out of 10 opportunities with Mod A cues.   Skilled Therapeutic Interventions:Skilled ST services focused on speech skills and family eductaion. Pt demonstrated spontaneous verbal expression at phrase level x2 and social greetings. SLP facilitated naming of common items from [place], pt named 3/5, verbal and semantic cues were ineffective at this time to named missed items. SLP provided emotional support to pt and daughter, pt became visibly upset, crying the last half of the session, demonstrating awareness of current reduced expressive abilities. PA came into to room and provided education about increase in medication to reduce seizer activity, all questions were answered to satisfaction. SLP provided education to daughter about eliciting verbal response via naming common items in room.Pt was left in room with call bell within reach and bed alarm set. Recommend to continue skilled ST services.      Pain Pain Assessment Pain Scale: 0-10 Pain Score: 0-No pain  Therapy/Group: Individual Therapy  Caisley Baxendale  [facility] 03/14/2018, 12:59 PM

## 2018-03-14 NOTE — Progress Notes (Signed)
Physical Therapy Session Note  Patient Details  Name: Bryan [name] MRN: [id number] Date of Birth: [birth year]  Today's Date: 03/14/2018 PT Individual Time: 0930-1012 PT Individual Time Calculation (min): 42 min   Short Term Goals: Week 1:  PT Short Term Goal 1 (Week 1): Pt will perfrom sit<>stnad with min assist consistently  PT Short Term Goal 2 (Week 1): Pt will ambuate 168ft with min assist and LRAD  PT Short Term Goal 3 (Week 1): Pt will propell WC 130ft with supervision assist  PT Short Term Goal 4 (Week 1): Pt wil perform bed<>WC transfer with min assist consistently   Skilled Therapeutic Interventions/Progress Updates:    pt rec'd in bed, eager for therapy.  Pt performs supine to sit with min A, mod A for sitting balance due to posterior lean.  Stand pivot transfers throughout session with min/mod A for LT LE stability and trunk control.  Pt seated in w/c has 2 seizures within 20 minutes, RN present and aware. Pt communicates need to use restroom. RN gives OK to sit pt on toilet. Pt performs toilet transfer with mod A, max A for standing balance and clothing management, mod A for sitting balance on toilet.  While at sink washing hands pt has seizure, RN made aware.  Pt left in bed with needs at hand, alarm set.  Therapy Documentation Precautions:  Precautions Precautions: Fall Precaution Comments: expressive aphasia, L hemiplgia, impulsive Restrictions Weight Bearing Restrictions: No General: PT Amount of Missed Time (min): 15 Minutes PT Missed Treatment Reason: Patient ill (Comment)(seizures) Pain: No c/o pain during session   Therapy/Group: Individual Therapy  Adaleen Hulgan 03/14/2018, 10:26 AM

## 2018-03-14 NOTE — Progress Notes (Signed)
Social Work  Social Work Assessment and Plan  Patient Details  Name: Bryan [name] MRN: [id number] Date of Birth: 11-Aug-1940  Today's Date: 03/14/2018  Problem List:  Patient Active Problem List   Diagnosis Date Noted  . Seizures ([place])   . Hypoalbuminemia due to protein-calorie malnutrition ([place])   . Hyponatremia   . Steroid-induced hyperglycemia   . Brain tumor ([place]) 03/11/2018  . Postoperative pain   . Seizure prophylaxis   . Prediabetes   . Dyslipidemia   . Brain mass 02/16/2018  . Diabetes mellitus without complication ([place])   . Hypercholesterolemia   . Hypertension    Past Medical History:  Past Medical History:  Diagnosis Date  . Arthritis   . Diabetes mellitus without complication ([place])   . GERD (gastroesophageal reflux disease)   . Hypercholesterolemia   . Hypertension   . Thyroid disease    Past Surgical History:  Past Surgical History:  Procedure Laterality Date  . HERNIA REPAIR     Social History:  reports that he has quit smoking. He has never used smokeless tobacco. He reports current alcohol use of about 1.0 standard drinks of alcohol per week. He reports that he does not use drugs.  Family / Support Systems Marital Status: Married Patient Roles: Spouse, Parent Spouse/Significant Other: wife, Numan Zylstra @ [phone] Children: two adult children:  daughter, Marcial Pacas [facility]) @ [phone] and son, Catalina Antigua ([place]) who is currently here for ~ 3 more weeks. Anticipated Caregiver: wife, daughter, and son in law  Ability/Limitations of Caregiver: Min A Caregiver Availability: 24/7 Family Dynamics: Wife very supportive and notes that her daughter and son-in-law are also very involved but do have 50 young children.  Daughter visiting pt today and very encouraging and reassuring to pt.  Social History Preferred language: English Religion: Baptist Cultural Background: NA Read: Yes Write: Yes Employment Status: Retired Date  Retired/Disabled/Unemployed: 11 yrs ago Public relations account executive Issues: None Guardian/Conservator: None - per MD, pt is not capable of making decisions on his own behalf - defer to spouse.   Abuse/Neglect Abuse/Neglect Assessment Can Be Completed: Yes Physical Abuse: Denies Verbal Abuse: Denies Sexual Abuse: Denies Exploitation of patient/patient's resources: Denies Self-Neglect: Denies  Emotional Status Pt's affect, behavior and adjustment status: Pt with significant expressive aphasia and unable to engage in interview.  Wife completes assessment.  Pt does not appear in any distress - will refer for neuropsychology as indicated during stay. Recent Psychosocial Issues: None Psychiatric History: None Substance Abuse History: None  Patient / Family Perceptions, Expectations & Goals Pt/Family understanding of illness & functional limitations: Cannot assess pt's understanding.  Wife and daughter with good understanding of pt's tumor, surgery performed and current functional limitations/ need for CIR. Premorbid pt/family roles/activities: Pt was completely independent prior to ~nov 2019 Anticipated changes in roles/activities/participation: Wife to assume primary caregiver role.  Daughter and son-in-law to increase support as well. Pt/family expectations/goals: Family hopeful pt will reach targeted supervision goals.  Community Resources Express Scripts: None Premorbid Home Care/DME Agencies: None Transportation available at discharge: yes Resource referrals recommended: Neuropsychology, Support group (specify)  Discharge Planning Living Arrangements: Spouse/significant other Support Systems: Spouse/significant other, Children, Friends/neighbors Type of Residence: Private residence Insurance underwriter Resources: [employer], Multimedia programmer (specify) Financial Resources: Radio broadcast assistant Screen Referred: No Living Expenses: Own Money Management: Patient Does the patient have  any problems obtaining your medications?: No Home Management: pt and spouse shared home responsibilities Patient/Family Preliminary Plans: Pt to return home with wife who will provide  primary support. Social Work Anticipated Follow Up Needs: HH/OP Expected length of stay: 14-18 days  Clinical Impression Elderly, frail appearing gentleman here following brain tumor surgery at [facility].  Presents with expressive aphasia, therefore, wife provides all assessment information.  Wife and local daughter are very supportive and wife able to provide physical assistance.  Unable to assess pt's emotional status due to communication deficits.  Will monitor and follow for support and d/c planning needs.  Tacora Athanas 03/14/2018, 2:32 PM

## 2018-03-14 NOTE — Progress Notes (Signed)
Inpatient Rehabilitation  Patient information reviewed and entered into eRehab system by Derril Franek M. Toran Murch, M.A., CCC/SLP, PPS Coordinator.  Information including medical coding, functional ability and quality indicators will be reviewed and updated through discharge.    Per nursing patient was given "Data Collection Information Summary" for Patients in Inpatient Rehabilitation Facilities with attached "Privacy Act Statement-Health Care Records" upon admission.   

## 2018-03-14 NOTE — Progress Notes (Signed)
Occupational Therapy Session Note  Patient Details  Name: Bryan [name] MRN: [id number] Date of Birth: 1940-06-13  Today's Date: 03/14/2018 OT Individual Time: 2542-7062 Session 2: 1430-1500 OT Individual Time Calculation (min): 69 min Session 2: 30 min   Short Term Goals: Week 1:  OT Short Term Goal 1 (Week 1): Pt will maintain sitting balance on toilet or BSC with supervision assist while voiding  OT Short Term Goal 2 (Week 1): Pt will complete sit<stand during LB self care with Min A  OT Short Term Goal 3 (Week 1): Pt will initiate 1 limb protection strategy during B/D for 1 OT session   Skilled Therapeutic Interventions/Progress Updates:    Pt received supine in bed eating breakfast. Pain as described below. Assistance with container management required but able to manage rest of meal independently. Pt requested use of urinal via pointing, mod A for brief management and positioning required, pt able to void bladder with auditory cue of water running. While eating breakfast, pt began having jerking movement and was unresponsive with left gaze, RN was called and timer began to monitor seizure like activity- RN entered room, seizure like activity lasting approximately 1 min. Pt continued eating breakfast and reported no further pain or discomfort. Pt transitioned to EOB with mod A. Pt's EOB sitting balance fluctuated between CGA to min A during bathing. Pt required min A to bath underneath armpits. Pt able to thread B LE through underwear/pants and pull up in standing with mod A for standing balance support. Pt had 2 more seizxure like activity episodes during session lasting 1 min 23 sec and 1 min 50 sec. Pt completed oral care at sink with set up assist and copious amount of food in mouth from breakfast. Pt returned to bed d/t MD request from seizures and was left with all bed rails up and bed alarm set.    Session 2: Session focused on toileting and self care tasks. Pt indicated need for  bathroom via pointing. Pt completed stand pivot transfer to toilet with mod A. Mod A required for clothing management. Pt voided b&b and completed posterior peri hygiene with CGA while seated and leaning L. Pt requiring heavy cueing for positioning while sitting on BSC d/t heavy L lean. Pt then completed oral care and hand washing at sink with min A. Pt was left with daughter in dayroom and provided education on PROM. No indications of pain throughout session.   Therapy Documentation Precautions:  Precautions Precautions: Fall Precaution Comments: expressive aphasia, L hemiplgia, impulsive Restrictions Weight Bearing Restrictions: No  Pain: Pain Assessment Pain Scale: 0-10 Pain Score: 2  Pain Type: Acute pain Pain Location: Head Pain Orientation: Anterior Pain Descriptors / Indicators: Headache Pain Onset: On-going Pain Intervention(s): Emotional support   Therapy/Group: Individual Therapy  Curtis Sites 03/14/2018, 8:40 AM

## 2018-03-14 NOTE — Progress Notes (Signed)
At 2400, observed seizure activity-left arm and face twitching. Patient not responding to verbal commands during seizure, which lasted approximately 45 seconds to 1 minute. Afterwards patient giving thumbs up, when asked if he was ok. Vitals signs in computer and CBG=218. Paged Dr. Alain Marion R/T to above events. Orders given. Patrici Ranks A

## 2018-03-14 NOTE — Care Management (Signed)
Inpatient Rehabilitation Center Individual Statement of Services  Patient Name:  Bryan [name]  Date:  03/14/2018  Welcome to the [facility].  Our goal is to provide you with an individualized program based on your diagnosis and situation, designed to meet your specific needs.  With this comprehensive rehabilitation program, you will be expected to participate in at least 3 hours of rehabilitation therapies Monday-Friday, with modified therapy programming on the weekends.  Your rehabilitation program will include the following services:  Physical Therapy (PT), Occupational Therapy (OT), Speech Therapy (ST), 24 hour per day rehabilitation nursing, Therapeutic Recreaction (TR), Neuropsychology, Case Management (Social Worker), Rehabilitation Medicine, Nutrition Services and Pharmacy Services  Weekly team conferences will be held on Tuesdays to discuss your progress.  Your Social Worker will talk with you frequently to get your input and to update you on team discussions.  Team conferences with you and your family in attendance may also be held.  Expected length of stay: 14-18 days   Overall anticipated outcome: minimal assistance  Depending on your progress and recovery, your program may change. Your Social Worker will coordinate services and will keep you informed of any changes. Your Social Worker's name and contact numbers are listed  below.  The following services may also be recommended but are not provided by the [facility] will be made to provide these services after discharge if needed.  Arrangements include referral to agencies that provide these services.  Your insurance has been verified to be:  Medicare; [place] Your primary doctor is:  Dr. Forde Dandy  Pertinent information will be shared with your doctor and your insurance  company.  Social Worker:  [place], [place] or [phone]   Information discussed with and copy given to patient by: Lennart Pall, 03/14/2018, 1:01 PM

## 2018-03-14 NOTE — Progress Notes (Signed)
Paged Dr. Alain Marion with results of head CT, no new orders. And made him aware of lab results drawn at 1624. Orders to place peripheral IV and start NS at 75cc/hr.Bryan [name] A

## 2018-03-15 ENCOUNTER — Inpatient Hospital Stay (HOSPITAL_COMMUNITY): Payer: Medicare Other

## 2018-03-15 ENCOUNTER — Inpatient Hospital Stay (HOSPITAL_COMMUNITY): Payer: Medicare Other | Admitting: Occupational Therapy

## 2018-03-15 ENCOUNTER — Inpatient Hospital Stay (HOSPITAL_COMMUNITY): Payer: Medicare Other | Admitting: Speech Pathology

## 2018-03-15 LAB — GLUCOSE, CAPILLARY
GLUCOSE-CAPILLARY: 150 mg/dL — AB (ref 70–99)
Glucose-Capillary: 185 mg/dL — ABNORMAL HIGH (ref 70–99)
Glucose-Capillary: 197 mg/dL — ABNORMAL HIGH (ref 70–99)
Glucose-Capillary: 240 mg/dL — ABNORMAL HIGH (ref 70–99)
Glucose-Capillary: 188 mg/dL — ABNORMAL HIGH (ref 70–99)

## 2018-03-15 MED ORDER — LISINOPRIL 5 MG PO TABS
5.0000 mg | ORAL_TABLET | Freq: Once | ORAL | Status: AC
  Administered 2018-03-15: 5 mg via ORAL
  Filled 2018-03-15: qty 1

## 2018-03-15 MED ORDER — LISINOPRIL 5 MG PO TABS
15.0000 mg | ORAL_TABLET | Freq: Every day | ORAL | Status: DC
  Administered 2018-03-16 – 2018-04-06 (×22): 15 mg via ORAL
  Filled 2018-03-15 (×22): qty 1

## 2018-03-15 NOTE — Progress Notes (Addendum)
Physical Therapy Session Note  Patient Details  Name: Bryan [name] MRN: [id number] Date of Birth: Nov 03, 1940  Today's Date: 03/15/2018 PT Individual Time: 1315-1400 PT Individual Time Calculation (min): 45 min   Short Term Goals: Week 1:  PT Short Term Goal 1 (Week 1): Pt will perfrom sit<>stnad with min assist consistently  PT Short Term Goal 2 (Week 1): Pt will ambuate 160ft with min assist and LRAD  PT Short Term Goal 3 (Week 1): Pt will propell WC 180ft with supervision assist  PT Short Term Goal 4 (Week 1): Pt wil perform bed<>WC transfer with min assist consistently   Skilled Therapeutic Interventions/Progress Updates:   Pt seated in w/c,  just starting to eat lunch at beginning of session; RN and NT assisting him with set-up and supervision.  PT returned in 15 min.  Pt still eating.  Mod cues for small bites and chewing thoroughly.  Pt initially ate food on R side of plate only, but as he continued eating, he attended to, and ate every bite of food on his plate. PT assisted pt with  Hand-over-hand assistance to use L hand for gross grasp to hold a cup as he poured bottled water into it with his R hand.  Stand pivot transfer w/c > mat to R/L , mod assist.  Aphasia limits pt's comprehension of technique.    Balance challenge/neuro re-ed and sustained stretch bil heel cords and hamstrings, standing with forefeet on wedge, x 2 minutes with bil UE support on RW, x 1 minute with bil UE support fading to 0UE support.  During 2nd bout of standing, pt appeared to be trying to communicate with his R hand; L facial movement started. PT assisted pt to sitting.  Pt had seizure affecting L side of face, LUE and bil eyes (deviated to the L), x approx 1 minute.  After seizure was over, PT assisted pt back to w/c.  Stand pivot transfer to bed, min/mod assist, using bed rail in R hand, moving to R from w/c.  Sit> supine with mod assist.  Pt left resting in bed with alarm set, needs at hand.  PT  asked pt what he would do if he needed help or to use BR; he stated: "call" and was able to point to call bell.  PT consulted with [place], LPN regarding pt's sei zure.  She has informed [name], [name] of pt's seizure activity today.     Therapy Documentation Precautions:  Precautions Precautions: Fall Precaution Comments: expressive aphasia, L hemiplgia, impulsive Restrictions Weight Bearing Restrictions: No General: PT Amount of Missed Time (min): 15 Minutes PT Missed Treatment Reason: Other (Comment)(late lunch) Pain: Pain Assessment Pain Scale: 0-10 Pain Score: 0-No pain     Therapy/Group: Individual Therapy  Tysha Grismore 03/15/2018, 4:31 PM

## 2018-03-15 NOTE — Patient Care Conference (Signed)
Inpatient RehabilitationTeam Conference and Plan of Care Update Date: 03/15/2018   Time: 9:15 AM    Patient Name: Bryan [name]      Medical Record Number: [id number]  Date of Birth: October 16, 1940 Sex: Male         Room/Bed: 4W13C/4W13C-01 Payor Info: Payor: MEDICARE / Plan: MEDICARE PART A AND B / Product Type: *No Product type* /    Admitting Diagnosis: Brain Tumor resection  Admit Date/Time:  03/11/2018 11:41 AM Admission Comments: No comment available   Primary Diagnosis:  <principal problem not specified> Principal Problem: <principal problem not specified>  Patient Active Problem List   Diagnosis Date Noted  . Seizures ([place])   . Hypoalbuminemia due to protein-calorie malnutrition ([place])   . Hyponatremia   . Steroid-induced hyperglycemia   . Brain tumor ([place]) 03/11/2018  . Postoperative pain   . Seizure prophylaxis   . Prediabetes   . Dyslipidemia   . Brain mass 02/16/2018  . Diabetes mellitus without complication ([place])   . Hypercholesterolemia   . Hypertension     Expected Discharge Date: Expected Discharge Date: 04/06/18  Team Members Present: Physician leading conference: Dr. 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 Status post craniotomy 03/07/2018. Patient was to follow up with Dr.Vaslow and Dr. Tammi Klippel on discharge.   See above  Improve mobility, cognition, seizures, HTN/DM   Bowel/Bladder   continent of bowel and bladder can be incontinent of bowel at times LBM 12-22  maintain regular bowel pattern, continent of bowel and bladder  Assist with tolieting needs prn   Swallow/Nutrition/ Hydration             ADL's   Mod/max A transfers, Max A BADL tasks  Supervision/CGA goals  modified bathing/dressing, sitting balance, transfers, NMR   Mobility   min/mod A gait with RW and transfers  supervision overall, min A step  NMR, balance, gait   Communication   Significant expressive aphasia  Min A for verbal expression  use of word finding strategies   Safety/Cognition/ Behavioral Observations            Pain   c/o headache managed with tylenol prn  <2  Assess pain q shift and prn   Skin   incision right crani sutures  no new skin issues   Assess skin q shift and prn    Rehab Goals Patient on target to meet rehab goals: Yes *See Care Plan and progress notes for long and short-term goals.     Barriers to Discharge  Current Status/Progress Possible Resolutions Date Resolved   Physician    Medical stability        Therapies, optimize DM/HTN meds, Neuro recs for breakthrough      Nursing                  PT  Inaccessible home environment;Decreased caregiver support;Medical stability                 OT Medical stability;Behavior                SLP                SW  Discharge Planning/Teaching Needs:  Plan to d/c home with wife who can provide 24/7 assistance.  Local daughter also supportive.  Teaching to be done closer to d/c.   Team Discussion:  Weaning decadron;  Increased anti-sz meds.  Overall max - mod assist with ADLs and mobility.  Supervision to [place] assist goals.  Significant expressive aphasia - max assist with min assist goals.  Revisions to Treatment Plan:  NA    Continued Need for Acute Rehabilitation Level of Care: The patient requires daily medical management by a physician with specialized training in physical medicine and rehabilitation for the following conditions: Daily direction of a multidisciplinary physical rehabilitation program to ensure safe treatment while eliciting the highest outcome that is of practical value to the patient.: Yes Daily medical  management of patient stability for increased activity during participation in an intensive rehabilitation regime.: Yes Daily analysis of laboratory values and/or radiology reports with any subsequent need for medication adjustment of medical intervention for : Neurological problems;Diabetes problems;Blood pressure problems   I attest that I was present, lead the team conference, and concur with the assessment and plan of the team.   Zaelynn Fuchs 03/15/2018, 10:42 AM

## 2018-03-15 NOTE — Evaluation (Signed)
Speech Language Pathology Assessment and Plan  Patient Details  Name: Bryan [name] MRN: [id number] Date of Birth: 16-Sep-1940  SLP Diagnosis: Aphasia;Cognitive Impairments  Rehab Potential: Good ELOS: 1/15   BEDSIDE SWALLOW EVALUATION - continue to target aphasia and cognitive deficits - dysphagia therapy not indicated  Today's Date: 03/15/2018 SLP Individual Time: 0930-1000 SLP Individual Time Calculation (min): 30 min   Problem List:  Patient Active Problem List   Diagnosis Date Noted  . Seizures ([place])   . Hypoalbuminemia due to protein-calorie malnutrition ([place])   . Hyponatremia   . Steroid-induced hyperglycemia   . Brain tumor ([place]) 03/11/2018  . Postoperative pain   . Seizure prophylaxis   . Prediabetes   . Dyslipidemia   . Brain mass 02/16/2018  . Diabetes mellitus without complication ([place])   . Hypercholesterolemia   . Hypertension    Past Medical History:  Past Medical History:  Diagnosis Date  . Arthritis   . Diabetes mellitus without complication ([place])   . GERD (gastroesophageal reflux disease)   . Hypercholesterolemia   . Hypertension   . Thyroid disease    Past Surgical History:  Past Surgical History:  Procedure Laterality Date  . HERNIA REPAIR      Assessment / Plan / Recommendation Clinical Impression  Pt has developed acute onset of seizures. Pt presents with appropriate swallow function when consuming thin liquids via straw and age appropriate solids. However, pt experienced 2 - 1 minute in duration seizure post swallow. Pt with throat clears post seizure. Would recommend full supervision with all PO consumption to prevent choking should a seizure occur with bolus in mouth. Will not set any dysphagia goals at this time. Nursing will continue to monitor vitals. Can reassess if pt develops any respiratory issues. Family is ok to supervise. In regards to acute onset of seizures, pt now presents further impaired expressive and receptive deficits. Pt was  talking in intermittent phrase length at evaluation during unstructured tasks. However pt is currently only intermittently expressing self at word level and is supplementing with gestures (although not always successful). Pt is aware and frustrated. When attempting to communicate pt now presents with verbal/physical blocking. Pt also has decreased ability to demonstrate selective attention, perform basic problem solving tasks and increased difficulty following directions. Speech-langauge goals have been downgraded to reflect. Continue per current plan of care targeting cognitive linguistic deficits.      Skilled Therapeutic Interventions          Skilled treatment session focused on completion of BSE and language facilitation.   SLP Assessment  Patient will need skilled Speech Lanaguage Pathology Services during CIR admission    Recommendations  SLP Diet Recommendations: Age appropriate regular solids;Thin Liquid Administration via: Cup;Straw Medication Administration: Whole meds with liquid Supervision: Full supervision/cueing for compensatory strategies(d/t seizure activity) Compensations: Minimize environmental distractions;Slow rate;Small sips/bites Postural Changes and/or Swallow Maneuvers: Seated upright 90 degrees;Upright 30-60 min after meal Oral Care Recommendations: Oral care BID Recommendations for Other Services: Neuropsych consult Patient destination: Home Follow up Recommendations: Outpatient SLP;24 hour supervision/assistance Equipment Recommended: None recommended by SLP    SLP Frequency 3 to 5 out of 7 days   SLP Duration  SLP Intensity  SLP Treatment/Interventions 1/15  Minumum of 1-2 x/day, 30 to 90 minutes  Speech/Language facilitation;Patient/family education;Dysphagia/aspiration precaution training    Pain Pain Assessment Pain Scale: 0-10 Pain Score: 0-No pain Faces Pain Scale: No hurt  Prior Functioning    Short Term Goals: Week 1: SLP Short Term Goal 1  (  Week 1): Pt will name familiar objects with ~ 90% accuracy and Mod A cues.  SLP Short Term Goal 1 - Progress (Week 1): Other (comment)(Revised d/t acute onset of seizures) SLP Short Term Goal 2 (Week 1): Pt will express wants and needs using multimodal means with Mod A cues in 7 out of 10 opportunities.  SLP Short Term Goal 2 - Progress (Week 1): Other (comment)(Revised d/t acute onset of seizures) SLP Short Term Goal 3 (Week 1): Pt will read at the word level with ~ 75% accuracy and Mod A cues.  SLP Short Term Goal 3 - Progress (Week 1): Other (comment)(revised d/t acute onset seizures) SLP Short Term Goal 4 (Week 1): Pt will self-monitor and self-correct verbal errors in 7 out of 10 opportunities with Mod A cues.  SLP Short Term Goal 4 - Progress (Week 1): Discontinued (comment)(decrease in ability d/t acute onset seizures) SLP Short Term Goal 5 (Week 1): Pt will demonstrate selective attention to task in mildly distracting environment for ~ 15 minutes with Min A cues.  SLP Short Term Goal 5 - Progress (Week 1): Other (comment)(Cognitive goals added d/t decline in ability as a result of acute onset seizures) SLP Short Term Goal 6 (Week 1): Pt will complete basic familiar tasks with Min A cues.  SLP Short Term Goal 6 - Progress (Week 1): Other (comment)(Cognitive goals added d/t decline in ability as a result of acute onset seizures)  Refer to Care Plan for Long Term Goals  Recommendations for other services: Neuropsych  Discharge Criteria: Patient will be discharged from SLP if patient refuses treatment 3 consecutive times without medical reason, if treatment goals not met, if there is a change in medical status, if patient makes no progress towards goals or if patient is discharged from hospital.  The above assessment, treatment plan, treatment alternatives and goals were discussed and mutually agreed upon: No family available/patient unable  Dickie Cloe 03/15/2018, 10:46 AM

## 2018-03-15 NOTE — Progress Notes (Addendum)
Occupational Therapy Session Note  Patient Details  Name: Bryan [name] MRN: [id number] Date of Birth: 1940/09/12  Today's Date: 03/15/2018 OT Individual Time: 4098-1191 OT Individual Time Calculation (min): 45 min    Short Term Goals: Week 1:  OT Short Term Goal 1 (Week 1): Pt will maintain sitting balance on toilet or BSC with supervision assist while voiding  OT Short Term Goal 2 (Week 1): Pt will complete sit<stand during LB self care with Min A  OT Short Term Goal 3 (Week 1): Pt will initiate 1 limb protection strategy during B/D for 1 OT session   Skilled Therapeutic Interventions/Progress Updates:    Pt presents supine in bed pleasant and willing to participate in therapy session, no c/o pain throughout. Pt completing taking AM meds with RN and then attempting to use urinal while seated in bed initially. Pt noted to have seizure like symptoms while sitting up in bed, lasting approx 1 min, RN made aware. Pt with no continued symptoms or adverse reactions, wishing to continue with therapy session.Transitioned to sitting EOB with minA and cues for scooting hips towards EOB. [place] stand pivot transfer EOB>w/c, pt requesting to brush teeth with min hand over hand assist provided to implement use of LUE into task completion. Pt gesturing/reporting need to toilet; transfer w/c<>BSC over toilet with modA throughout. Pt completing peri-care after BM via lateral leans and CGA for sitting balance, assist provided from therapist to ensure thoroughness. Pt with additional episode of seizure like symptoms while seated on BSC, lasting approx 34min 10 sec (RN made aware). After episode pt impulsively attempting to continue performing peri-care, requiring cues to sit and rest and to ensure pt with no continued symptoms. Due to time constraints donned LB clothing seated on BSC with maxA for completion. Transitioned back to w/c and into room where pt was left seated in w/c with hand off to speech therapy to begin  treatment session.    Therapy Documentation Precautions:  Precautions Precautions: Fall Precaution Comments: expressive aphasia, L hemiplgia, impulsive Restrictions Weight Bearing Restrictions: No   Pain: denies pain      Therapy/Group: Individual Therapy  Raymondo Band 03/15/2018, 8:36 AM

## 2018-03-15 NOTE — Plan of Care (Signed)
  Problem: Consults Goal: RH STROKE PATIENT EDUCATION Description See Patient Education module for education specifics  Outcome: Progressing Goal: Nutrition Consult-if indicated Outcome: Progressing   Problem: RH BOWEL ELIMINATION Goal: RH STG MANAGE BOWEL WITH ASSISTANCE Description STG Manage Bowel with min-mod Assistance.  Outcome: Progressing Goal: RH STG MANAGE BOWEL W/MEDICATION W/ASSISTANCE Description STG Manage Bowel with Medication with min-mod Assistance.  Outcome: Progressing   Problem: RH BLADDER ELIMINATION Goal: RH STG MANAGE BLADDER WITH ASSISTANCE Description STG Manage Bladder With min-mod  Assistance  Outcome: Progressing Goal: RH STG MANAGE BLADDER WITH MEDICATION WITH ASSISTANCE Description STG Manage Bladder With Medication With min-mod Assistance.  Outcome: Progressing Goal: RH STG MANAGE BLADDER WITH EQUIPMENT WITH ASSISTANCE Description STG Manage Bladder With Equipment With min-mod  Assistance  Outcome: Progressing   Problem: RH SKIN INTEGRITY Goal: RH STG SKIN FREE OF INFECTION/BREAKDOWN Outcome: Progressing Goal: RH STG MAINTAIN SKIN INTEGRITY WITH ASSISTANCE Description STG Maintain Skin Integrity With min Assistance.  Outcome: Progressing   Problem: RH SAFETY Goal: RH STG ADHERE TO SAFETY PRECAUTIONS W/ASSISTANCE/DEVICE Description STG Adhere to Safety Precautions With min-mod Assistance/Device.  Outcome: Progressing   Problem: RH COGNITION-NURSING Goal: RH STG USES MEMORY AIDS/STRATEGIES W/ASSIST TO PROBLEM SOLVE Description STG Uses Memory Aids/Strategies With min Assistance to Problem Solve.  Outcome: Progressing   Problem: RH PAIN MANAGEMENT Goal: RH STG PAIN MANAGED AT OR BELOW PT'S PAIN GOAL Outcome: Progressing   Problem: RH KNOWLEDGE DEFICIT Goal: RH STG INCREASE KNOWLEDGE OF DIABETES Outcome: Progressing Goal: RH STG INCREASE KNOWLEDGE OF DYSPHAGIA/FLUID INTAKE Outcome: Progressing Goal: RH STG INCREASE KNOWLEGDE OF  HYPERLIPIDEMIA Outcome: Progressing Goal: RH STG INCREASE KNOWLEDGE OF STROKE PROPHYLAXIS Outcome: Progressing   Problem: RH Vision Goal: RH LTG Vision (Specify) Outcome: Progressing

## 2018-03-15 NOTE — Progress Notes (Signed)
Patient had a small seizure that lasted a little over a minute. Small spasms noted, focal deviation & fixation to the left side. He was alert the whole time & was able to nod. He has expressive aphasia but can make his needs known. Will continue to monitor

## 2018-03-15 NOTE — Progress Notes (Signed)
Speech Language Pathology Daily Session Note  Patient Details  Name: Bryan [name] MRN: [id number] Date of Birth: [birth year]  Today's Date: 03/15/2018 SLP Individual Time: 1118-1203 SLP Individual Time Calculation (min): 45 min  Short Term Goals: Week 1: SLP Short Term Goal 1 (Week 1): Pt will name familiar objects with ~ 90% accuracy and Mod A cues.  SLP Short Term Goal 1 - Progress (Week 1): Other (comment) SLP Short Term Goal 2 (Week 1): Pt will express wants and needs using multimodal means with Mod A cues in 7 out of 10 opportunities.  SLP Short Term Goal 2 - Progress (Week 1): Other (comment)(Revised d/t acute onset of seizures) SLP Short Term Goal 3 (Week 1): Pt will read at the word level with ~ 75% accuracy and Mod A cues.  SLP Short Term Goal 3 - Progress (Week 1): Other (comment)(revised d/t acute onset seizures) SLP Short Term Goal 4 (Week 1): Pt will self-monitor and self-correct verbal errors in 7 out of 10 opportunities with Mod A cues.  SLP Short Term Goal 4 - Progress (Week 1): Discontinued (comment)(decrease in ability d/t acute onset seizures) SLP Short Term Goal 5 (Week 1): Pt will demonstrate selective attention to task in mildly distracting environment for ~ 15 minutes with Min A cues.  SLP Short Term Goal 5 - Progress (Week 1): Other (comment)(Cognitive goals added d/t decline in ability as a result of acute onset seizures) SLP Short Term Goal 6 (Week 1): Pt will complete basic familiar tasks with Min A cues.  SLP Short Term Goal 6 - Progress (Week 1): Other (comment)(Cognitive goals added d/t decline in ability as a result of acute onset seizures)  Skilled Therapeutic Interventions:  Pt was seen for skilled ST targeting communication goals.  Upon arrival, pt was seated upright in wheelchair, awake, alert, and agreeable to participating in therapy.  Pt independently asked for his slippers when leaving the room but with a significant delay in response time.  During  functional conversations with therapist, pt's speech was telegraphic but appropriate for accuracy of content at the word to short phrase level when allowed extra time for processing and word finding.  Pt had a seizure that lasted ~1 minute and 15 seconds during session, after which pt had slightly increased difficulty communicating in terms of content.  Pt was able to gesture that he needed the urinal as therapist was leaving the room.  Therapist attempted to assist pt in placing the urinal; however, pt had already voided in his incontinence brief.  Nursing informed.  Pt was left in wheelchair with chair alarm set and call bell within reach.  Continue per current plan of care.    Pain Pain Assessment Pain Scale: 0-10 Pain Score: 0-No pain Faces Pain Scale: No hurt  Therapy/Group: Individual Therapy  Jeanean Hollett, Selinda Orion 03/15/2018, 12:38 PM

## 2018-03-15 NOTE — Progress Notes (Signed)
Bryan [name] PHYSICAL MEDICINE & REHABILITATION PROGRESS NOTE  Subjective/Complaints: Patient seen laying in bed this morning.  He indicates that he slept well overnight.  No reported issues overnight.  ROS: Limited due to cognition but appears to deny CP, shortness of breath, nausea, vomiting, diarrhea.  Objective: Vital Signs: Blood pressure (!) 147/74, pulse 66, temperature 98.5 F (36.9 C), temperature source Oral, resp. rate 15, height 5\' 5"  (1.651 m), weight 77.7 kg, SpO2 93 %. Ct Head Wo Contrast  Result Date: 03/13/2018 CLINICAL DATA:  Seizure.  Craniotomy for mass 03/07/2018. EXAM: CT HEAD WITHOUT CONTRAST TECHNIQUE: Contiguous axial images were obtained from the base of the skull through the vertex without intravenous contrast. COMPARISON:  MRI 02/16/2018.  CT 02/15/2018. 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 Some fluid and blood products in the post resection cavity as expected. Small amount of subdural fluid, air and blood at the site of the craniotomy as expected. Mild mass effect with right-to-left shift of 4 mm as seen previously. No unexpected hemorrhage or mass effect. No sign ischemic infarction. No hydrocephalus. Vascular: There is atherosclerotic calcification of the major vessels at the base of the brain. Skull: Right pterional craniotomy as above. Sinuses/Orbits: Chronic left maxillary sinusitis. Other sinuses are clear. Orbits are negative. 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 Expected postoperative findings. Mild persistent mass effect with right-to-left shift of 4 mm. Electronically Signed   By: Nelson Chimes M.D.   On: 03/13/2018 20:17   Recent Labs    03/13/18 1624 03/14/18 0535  WBC 11.0* 9.9  HGB 12.3* 10.9*  HCT 35.6* 31.8*  PLT 234 200   Recent Labs    03/13/18 1624 03/14/18 0535  NA 130*  134*  K 4.8 4.6  CL 98 101  CO2 18* 22  GLUCOSE 218* 247*  BUN 37* 30*  CREATININE 0.99 0.87  CALCIUM 8.8* 8.5*    Physical Exam: BP (!) 147/74 (BP Location: Left Arm)   Pulse 66   Temp 98.5 F (36.9 C) (Oral)   Resp 15   Ht 5\' 5"  (1.651 m)   Wt 77.7 kg   SpO2 93%   BMI 28.51 kg/m  Constitutional: He appears well-developed and well-nourished.  NAD. HENT: Left facial weakness. Craniotomy site clean and dry  Eyes:  EOMI.  No discharge. Cardiovascular:  RRR.  No JVD. Respiratory: Effort normal and breath sounds normal.  GI: Bowel sounds are normal. He exhibits no distension.  Musculoskeletal: No edema or tenderness in extremities  Neurological: He is alert.  Makes good eye contact with examiner.  Patient is dysarthric but intelligible.  Follows commands.  Fair awareness of deficits. Expressive greater than receptive aphasia Motor: Left upper extremity: Shoulder abduction, elbow flexion/extension 4/5, handgrip 2+/5, limited participation, stable Left lower extremity: 3/5 proximal distal , limited due to participation, stable Skin: Skin is warm and dry.  See above  Psychiatric: His affect is blunt. His speech is delayed and slurred. He is slowed.   Assessment/Plan: 1. 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  Physiatrist is providing close team supervision and 24 hour management of active medical problems listed below.  Physiatrist and rehab team continue to assess barriers to discharge/monitor patient progress toward functional and medical goals  Care Tool:  Bathing  Body parts bathed by patient: Chest, Abdomen, Front perineal area, Right upper leg, Left upper leg, Face, Buttocks, Right lower leg, Left lower leg   Body parts bathed by helper: Right arm, Left arm     Bathing assist Assist Level: Minimal Assistance - Patient > 75%      Upper Body Dressing/Undressing Upper body dressing   What is the patient wearing?: Hospital gown only    Upper body assist Assist Level: Minimal Assistance - Patient > 75%    Lower Body Dressing/Undressing Lower body dressing      What is the patient wearing?: Underwear/pull up, Pants     Lower body assist Assist for lower body dressing: Maximal Assistance - Patient 25 - 49%(increased assist due to time constraints)     Toileting Toileting    Toileting assist Assist for toileting: Maximal Assistance - Patient 25 - 49%     Transfers Chair/bed transfer  Transfers assist     Chair/bed transfer assist level: Moderate Assistance - Patient 50 - 74%     Locomotion Ambulation   Ambulation assist      Assist level: Minimal Assistance - Patient > 75% Assistive device: Walker-rolling Max distance: 40 ft   Walk 10 feet activity   Assist     Assist level: Minimal Assistance - Patient > 75% Assistive device: Walker-rolling   Walk 50 feet activity   Assist    Assist level: Moderate Assistance - Patient - 50 - 74% Assistive device: Hand held assist    Walk 150 feet activity   Assist    Assist level: Moderate Assistance - Patient - 50 - 74% Assistive device: Hand held assist    Walk 10 feet on uneven surface  activity   Assist Walk 10 feet on uneven surfaces activity did not occur: Safety/medical concerns         Wheelchair     Assist Will patient use wheelchair at discharge?: Yes Type of Wheelchair: Manual    Wheelchair assist level: Minimal Assistance - Patient > 75% Max wheelchair distance: 135ft    Wheelchair 50 feet with 2 turns activity    Assist        Assist Level: Minimal Assistance - Patient > 75%   Wheelchair 150 feet activity     Assist     Assist Level: Supervision/Verbal cueing      Medical Problem List and Plan: 1.  Decreased functional mobility with left side weakness and dysarthria secondary to  right insular mass. Status post craniotomy 03/07/2018. Patient was to follow up with Dr.Vaslow and Dr. Tammi Klippel on discharge.   Continue CIR  Decadron decreased to 4 mg every 8 hours on 12/23, continue taper every 3 days 2.  DVT Prophylaxis/Anticoagulation: SCDs. 3. Pain Management: Tylenol as needed 4. Mood:  Provide emotional support 5. Neuropsych: This patient is not capable of making decisions on his own behalf. 6. Skin/Wound Care:  Routine skin checks 7. Fluids/Electrolytes/Nutrition:  Routine in and out's  8. Seizure prophylaxis.   Discussed with neurology, Keppra increased to 1500 twice daily  Will consider addition of Vimpat if persistent breakthrough seizures 9. Hypertension.   Lisinopril increased to 15 mg on 12/24 10.  Steroid-induced hyperglycemia on prediabetes. Glucophage 1000 mg twice a day. Monitor closely while on tapering steroids  Elevated on 12/24  Continue to manage with SSI until steroid taper completed prior to making further changes 11. Hyperlipidemia. Lipitor 12.  Hyponatremia  Sodium 134 on 12/23  Continue to monitor 13.  Hypoalbuminemia  Supplement initiated on 12/23 14.  Acute blood loss anemia  Hemoglobin 10.9 on 12/23  Continue to monitor  LOS: 4 days A FACE TO FACE EVALUATION WAS PERFORMED  Bryan [name] [name] 03/15/2018, 11:14 AM

## 2018-03-16 LAB — GLUCOSE, CAPILLARY
Glucose-Capillary: 240 mg/dL — ABNORMAL HIGH (ref 70–99)
Glucose-Capillary: 258 mg/dL — ABNORMAL HIGH (ref 70–99)
Glucose-Capillary: 210 mg/dL — ABNORMAL HIGH (ref 70–99)
Glucose-Capillary: 176 mg/dL — ABNORMAL HIGH (ref 70–99)

## 2018-03-16 NOTE — Progress Notes (Signed)
Patient had another seizure at approximately 2048 as the nurse was getting ready to administer medications. It lasted for 1 minute & consisted of mild facial jerking & focal deviation to the left side. No acute distress noted when the seizure was over.He recovers quickly. He was alert & responsive. No c/o pain or discomfort. Will continue to monitor.

## 2018-03-16 NOTE — Progress Notes (Signed)
[facility] PHYSICAL MEDICINE & REHABILITATION PROGRESS NOTE  Subjective/Complaints: Patient seen laying in bed.  He states he slept well overnight, he remains sleepy.  Provide limited interaction.  ROS: Limited due to cognition but appears to deny CP, shortness of breath, nausea, vomiting, diarrhea.  Objective: Vital Signs: Blood pressure (!) 166/76, pulse 82, temperature 98 F (36.7 C), temperature source Oral, resp. rate 18, height 5\' 5"  (1.651 m), weight 77.7 kg, SpO2 97 %. No results found. Recent Labs    03/14/18 0535  WBC 9.9  HGB 10.9*  HCT 31.8*  PLT 200   Recent Labs    03/14/18 0535  NA 134*  K 4.6  CL 101  CO2 22  GLUCOSE 247*  BUN 30*  CREATININE 0.87  CALCIUM 8.5*    Physical Exam: BP (!) 166/76 (BP Location: Right Arm)   Pulse 82   Temp 98 F (36.7 C) (Oral)   Resp 18   Ht 5\' 5"  (1.651 m)   Wt 77.7 kg   SpO2 97%   BMI 28.51 kg/m  Constitutional: He appears well-developed and well-nourished.  NAD. HENT: Left facial weakness. Craniotomy site clean and dry  Eyes:  EOMI.  No discharge. Cardiovascular:  RRR.  No JVD. Respiratory: Effort normal and breath sounds normal.  GI: Bowel sounds are normal. He exhibits no distension.  Musculoskeletal: No edema or tenderness in extremities  Neurological: He is lethargic.  Makes good eye contact with examiner.  Patient is dysarthric but intelligible.  Follows commands.  Fair awareness of deficits. Expressive greater than receptive aphasia Motor: Left upper extremity: Shoulder abduction, elbow flexion/extension 4/5, handgrip 2+/5, limited participation, unchanged Left lower extremity: 3/5 proximal distal , limited due to participation, stable Skin: Skin is warm and dry.  See above  Psychiatric: His affect is blunt. His speech is delayed and slurred. He is slowed.   Assessment/Plan: 1. 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  Physiatrist is providing close team supervision and 24 hour management of active medical problems listed below.  Physiatrist and rehab team continue to assess barriers to discharge/monitor patient progress toward functional and medical goals  Care Tool:  Bathing    Body parts bathed by patient: Chest, Abdomen, Front perineal area, Right upper leg, Left upper leg, Face, Buttocks, Right lower leg, Left lower leg   Body parts bathed by helper: Right arm, Left arm     Bathing assist Assist Level: Minimal Assistance - Patient > 75%     Upper Body Dressing/Undressing Upper body dressing   What is the patient wearing?: Hospital gown only    Upper body assist Assist Level: Minimal Assistance - Patient > 75%    Lower Body Dressing/Undressing Lower body dressing      What is the patient wearing?: Underwear/pull up, Pants     Lower body assist Assist for lower body dressing: Maximal Assistance - Patient 25 - 49%(increased assist due to time constraints)     Toileting Toileting    Toileting assist Assist for toileting: Maximal Assistance - Patient 25 - 49%     Transfers Chair/bed transfer  Transfers assist     Chair/bed transfer assist level: Moderate Assistance - Patient 50 - 74%     Locomotion Ambulation   Ambulation assist      Assist level: Minimal Assistance - Patient > 75% Assistive device: Walker-rolling Max distance: 40 ft   Walk  10 feet activity   Assist     Assist level: Minimal Assistance - Patient > 75% Assistive device: Walker-rolling   Walk 50 feet activity   Assist    Assist level: Moderate Assistance - Patient - 50 - 74% Assistive device: Hand held assist    Walk 150 feet activity   Assist    Assist level: Moderate Assistance - Patient - 50 - 74% Assistive device: Hand held assist    Walk 10 feet on uneven surface  activity   Assist Walk 10 feet on  uneven surfaces activity did not occur: Safety/medical concerns         Wheelchair     Assist Will patient use wheelchair at discharge?: Yes Type of Wheelchair: Manual    Wheelchair assist level: Minimal Assistance - Patient > 75% Max wheelchair distance: 132ft    Wheelchair 50 feet with 2 turns activity    Assist        Assist Level: Minimal Assistance - Patient > 75%   Wheelchair 150 feet activity     Assist     Assist Level: Supervision/Verbal cueing      Medical Problem List and Plan: 1.  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 Status post craniotomy 03/07/2018. Patient was to follow up with Dr.Vaslow and Dr. Tammi Klippel on discharge.   Continue CIR  Decadron decreased to 4 mg every 8 hours on 12/23, continue taper every 3 days 2.  DVT Prophylaxis/Anticoagulation: SCDs. 3. Pain Management: Tylenol as needed 4. Mood:  Provide emotional support 5. Neuropsych: This patient is not capable of making decisions on his own behalf. 6. Skin/Wound Care:  Routine skin checks 7. Fluids/Electrolytes/Nutrition:  Routine in and out's  8. Seizure prophylaxis.   Discussed with neurology, Keppra increased to 1500 twice daily  Will consider addition of Vimpat if persistent breakthrough seizures  Consider adjusting medications if lethargy persistent 9. Hypertension.   Lisinopril increased to 15 mg on 12/24  Labile and elevated, consider further medication adjustments tomorrow if necessary 10.  Steroid-induced hyperglycemia on prediabetes. Glucophage 1000 mg twice a day. Monitor closely while on tapering steroids  Elevated on 12/25  Continue to manage with SSI until steroid taper completed prior to making further changes 11. Hyperlipidemia. Lipitor 12.  Hyponatremia  Sodium 134 on 12/23  Continue to monitor 13.  Hypoalbuminemia  Supplement initiated on 12/23 14.  Acute blood loss anemia  Hemoglobin 10.9 on  12/23  Continue to monitor  LOS: 5 days A FACE TO FACE EVALUATION WAS PERFORMED  Ankit [name] 03/16/2018, 6:47 PM

## 2018-03-17 ENCOUNTER — Inpatient Hospital Stay (HOSPITAL_COMMUNITY): Payer: Medicare Other | Admitting: Occupational Therapy

## 2018-03-17 ENCOUNTER — Inpatient Hospital Stay (HOSPITAL_COMMUNITY): Payer: Medicare Other | Admitting: Physical Therapy

## 2018-03-17 ENCOUNTER — Inpatient Hospital Stay (HOSPITAL_COMMUNITY): Payer: Medicare Other | Admitting: Speech Pathology

## 2018-03-17 LAB — GLUCOSE, CAPILLARY
Glucose-Capillary: 187 mg/dL — ABNORMAL HIGH (ref 70–99)
Glucose-Capillary: 239 mg/dL — ABNORMAL HIGH (ref 70–99)
Glucose-Capillary: 153 mg/dL — ABNORMAL HIGH (ref 70–99)
Glucose-Capillary: 94 mg/dL (ref 70–99)

## 2018-03-17 MED ORDER — DEXAMETHASONE 4 MG PO TABS
4.0000 mg | ORAL_TABLET | Freq: Two times a day (BID) | ORAL | Status: DC
  Administered 2018-03-17 – 2018-03-21 (×8): 4 mg via ORAL
  Filled 2018-03-17 (×8): qty 1

## 2018-03-17 NOTE — Progress Notes (Signed)
[facility] PHYSICAL MEDICINE & REHABILITATION PROGRESS NOTE  Subjective/Complaints: Up in bed with therapy. No new problems apparent today  ROS: limited due to language/communication   Objective: Vital Signs: Blood pressure (!) 154/80, pulse 66, temperature (!) 97.3 F (36.3 C), temperature source Oral, resp. rate 14, height 5\' 5"  (1.651 m), weight 77.7 kg, SpO2 97 %. No results found. No results for input(s): WBC, HGB, HCT, PLT in the last 72 hours. No results for input(s): NA, K, CL, CO2, GLUCOSE, BUN, CREATININE, CALCIUM in the last 72 hours.  Physical Exam: BP (!) 154/80 (BP Location: Right Arm)   Pulse 66   Temp (!) 97.3 F (36.3 C) (Oral)   Resp 14   Ht 5\' 5"  (1.651 m)   Wt 77.7 kg   SpO2 97%   BMI 28.51 kg/m  Constitutional: No distress . Vital signs reviewed. HEENT: EOMI, oral membranes moist, right crani incision cdi Neck: supple Cardiovascular: RRR without murmur. No JVD    Respiratory: CTA Bilaterally without wheezes or rales. Normal effort    GI: BS +, non-tender, non-distended  Musculoskeletal: No edema or tenderness in extremities  Neurological: He is alert Expressive language deficits.  Motor: Left upper extremity: Shoulder abduction, elbow flexion/extension 3-4/5, handgrip 2+ to 3-/5, limited participation, unchanged Left lower extremity: 3/5 proximal distal but inconsistent Skin: Skin is warm and dry.  See above  Psychiatric: alert, pleasant  Assessment/Plan: 1. 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  Physiatrist is providing close team supervision and 24 hour management of active medical problems listed below.  Physiatrist and rehab team continue to assess barriers to discharge/monitor patient progress toward functional and medical goals  Care Tool:  Bathing    Body parts bathed by patient: Chest, Abdomen,  Front perineal area, Right upper leg, Left upper leg, Face, Buttocks, Right lower leg, Left lower leg   Body parts bathed by helper: Right arm, Left arm     Bathing assist Assist Level: Minimal Assistance - Patient > 75%     Upper Body Dressing/Undressing Upper body dressing   What is the patient wearing?: Hospital gown only    Upper body assist Assist Level: Minimal Assistance - Patient > 75%    Lower Body Dressing/Undressing Lower body dressing      What is the patient wearing?: Underwear/pull up, Pants     Lower body assist Assist for lower body dressing: Maximal Assistance - Patient 25 - 49%(increased assist due to time constraints)     Toileting Toileting    Toileting assist Assist for toileting: Maximal Assistance - Patient 25 - 49%     Transfers Chair/bed transfer  Transfers assist     Chair/bed transfer assist level: Moderate Assistance - Patient 50 - 74%     Locomotion Ambulation   Ambulation assist      Assist level: Minimal Assistance - Patient > 75% Assistive device: Walker-rolling Max distance: 40 ft   Walk 10 feet activity   Assist     Assist level: Minimal Assistance - Patient > 75% Assistive device: Walker-rolling   Walk 50 feet activity   Assist    Assist level: Moderate Assistance - Patient - 50 - 74% Assistive device: Hand held assist    Walk 150 feet activity   Assist    Assist level: Moderate Assistance - Patient - 50 - 74% Assistive device: Hand held assist    Walk  10 feet on uneven surface  activity   Assist Walk 10 feet on uneven surfaces activity did not occur: Safety/medical concerns         Wheelchair     Assist Will patient use wheelchair at discharge?: Yes Type of Wheelchair: Manual    Wheelchair assist level: Minimal Assistance - Patient > 75% Max wheelchair distance: 165ft    Wheelchair 50 feet with 2 turns activity    Assist        Assist Level: Minimal Assistance - Patient >  75%   Wheelchair 150 feet activity     Assist     Assist Level: Supervision/Verbal cueing      Medical Problem List and Plan: 1.  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 Status post craniotomy 03/07/2018. Patient was to follow up with Dr.Vaslow and Dr. Tammi Klippel on discharge.   Continue CIR  Decadron decreased to 4 mg every 8 hours on 12/23, decrease to q12 today 12/26 2.  DVT Prophylaxis/Anticoagulation: SCDs. 3. Pain Management: Tylenol as needed 4. Mood:  Provide emotional support 5. Neuropsych: This patient is not capable of making decisions on his own behalf. 6. Skin/Wound Care:  Routine skin checks 7. Fluids/Electrolytes/Nutrition:  Routine in and out's  8. Seizure prophylaxis.   Discussed with neurology, Keppra increased to 1500 twice daily   consider addition of Vimpat if persistent breakthrough seizures  Alert at present 9. Hypertension.   Lisinopril increased to 15 mg on 12/24  Improved control 12/26 10.  Steroid-induced hyperglycemia on prediabetes. Glucophage 1000 mg twice a day. Monitor closely while on tapering steroids  Elevated on 12/25  SSI, steroid taper 11. Hyperlipidemia. Lipitor 12.  Hyponatremia  Sodium 134 on 12/23  Continue to monitor 13.  Hypoalbuminemia  Supplement initiated on 12/23 14.  Acute blood loss anemia  Hemoglobin 10.9 on 12/23  Continue to monitor  LOS: 6 days A FACE TO FACE EVALUATION WAS PERFORMED  Bryan [name] 03/17/2018, 9:23 AM

## 2018-03-17 NOTE — Progress Notes (Signed)
Social Work Patient ID: Bryan [name], male   DOB: 06-13-1940, 78 y.o.   MRN: [id number]   Have reviewed team conference with pt/ wife. She is aware and agreeable with targeted d/c date of 04/06/18 and CGA/min assist goals overall.  She is pleased with progress. Continue to follow.  Leanora Murin, LCSW

## 2018-03-17 NOTE — Progress Notes (Signed)
Speech Language Pathology Daily Session Note  Patient Details  Name: Bryan [name] MRN: [id number] Date of Birth: 10-22-1940  Today's Date: 03/17/2018 SLP Individual Time: 0930-1030 SLP Individual Time Calculation (min): 60 min  Short Term Goals: Week 1: SLP Short Term Goal 1 (Week 1): Pt will name familiar objects with ~ 90% accuracy and Mod A cues.  SLP Short Term Goal 1 - Progress (Week 1): Other (comment) SLP Short Term Goal 2 (Week 1): Pt will express wants and needs using multimodal means with Mod A cues in 7 out of 10 opportunities.  SLP Short Term Goal 2 - Progress (Week 1): Other (comment)(Revised d/t acute onset of seizures) SLP Short Term Goal 3 (Week 1): Pt will read at the word level with ~ 75% accuracy and Mod A cues.  SLP Short Term Goal 3 - Progress (Week 1): Other (comment)(revised d/t acute onset seizures) SLP Short Term Goal 4 (Week 1): Pt will self-monitor and self-correct verbal errors in 7 out of 10 opportunities with Mod A cues.  SLP Short Term Goal 4 - Progress (Week 1): Discontinued (comment)(decrease in ability d/t acute onset seizures) SLP Short Term Goal 5 (Week 1): Pt will demonstrate selective attention to task in mildly distracting environment for ~ 15 minutes with Min A cues.  SLP Short Term Goal 5 - Progress (Week 1): Other (comment)(Cognitive goals added d/t decline in ability as a result of acute onset seizures) SLP Short Term Goal 6 (Week 1): Pt will complete basic familiar tasks with Min A cues.  SLP Short Term Goal 6 - Progress (Week 1): Other (comment)(Cognitive goals added d/t decline in ability as a result of acute onset seizures)  Skilled Therapeutic Interventions: Skilled treatment session focused on communication goals. Upon arrival, patient demonstrating difficulty consuming medications whole with thin. Patient able to clear pill from oral cavity once administered with Dys. 1 textures. Therefore, recommend patient consumed medications crushed (per  his preference) in puree. RN aware. SLP facilitated session by providing extra time for patient to match a written word to a functional object from a field of 2. Patient completed task with 100% accuracy but required Max A multimodal cues to read multi-syllabic words. However, patient was Mod I to verbalize at the single syllable level.  Patient also matched a functional item from a field of 2 to a closed phrase with 50% accuracy and could read the function words with overall supervision verbal cues and required total to read the function words. Patient tearful throughout session and daughter and SLP provided support. Patient's daughter asking appropriate questions and educated in regards to patient's current function, goals of skilled SLP intervention and current strategies to maximize communication. She verbalized understanding and agreement. Patient left upright in recliner with alarm on and family present. Continue with current plan of care.      Pain Pain Assessment Pain Score: 2   Therapy/Group: Individual Therapy  Taila Basinski 03/17/2018, 12:37 PM

## 2018-03-17 NOTE — Progress Notes (Signed)
Physical Therapy Session Note  Patient Details  Name: Bryan [name] MRN: [id number] Date of Birth: 30-Nov-1940  Today's Date: 03/17/2018 PT Individual Time: 1030-1103 PT Individual Time Calculation (min): 33 min   Short Term Goals: Week 1:  PT Short Term Goal 1 (Week 1): Pt will perfrom sit<>stnad with min assist consistently  PT Short Term Goal 2 (Week 1): Pt will ambuate 120ft with min assist and LRAD  PT Short Term Goal 3 (Week 1): Pt will propell WC 168ft with supervision assist  PT Short Term Goal 4 (Week 1): Pt wil perform bed<>WC transfer with min assist consistently   Skilled Therapeutic Interventions/Progress Updates:    pt rec'd in recliner with daughter present. Pt c/o fatigue but is willing to participate initially.  Pt performs standing pre gait activities with min A with use of bedrail for pregait stepping fwd/bkwd and sideways with alternating LEs.  Mod A for backward stepping with Lt LE and for Lt foot placement due to decreased sensation. Pt then expresses need to use urinal. Pt min A for sit <> stand, mod A for clothing management and urinal placement.  Pt continent of bladder.  Pt with 1 seizure during session x 60 seconds. Pt with increased fatigue after seizure, requests to rest. Pt left in recliner with alarm set, needs at hand.  Therapy Documentation Precautions:  Precautions Precautions: Fall Precaution Comments: expressive aphasia, L hemiplgia, impulsive Restrictions Weight Bearing Restrictions: No General: PT Amount of Missed Time (min): 10 Minutes PT Missed Treatment Reason: Patient fatigue Pain:  no signs/symptoms of pain during session   Therapy/Group: Individual Therapy  Bryan [name] 03/17/2018, 11:05 AM

## 2018-03-17 NOTE — Progress Notes (Signed)
Occupational Therapy Session Note  Patient Details  Name: Bryan [name] MRN: [id number] Date of Birth: 03-16-1941  Today's Date: 03/17/2018  Session 1 OT Individual Time: 0705-0800 OT Individual Time Calculation (min): 55 min   Session 2 OT Individual Time: 3893-7342 OT Individual Time Calculation (min): 28 min    Short Term Goals: Week 1:  OT Short Term Goal 1 (Week 1): Pt will maintain sitting balance on toilet or BSC with supervision assist while voiding  OT Short Term Goal 2 (Week 1): Pt will complete sit<stand during LB self care with Min A  OT Short Term Goal 3 (Week 1): Pt will initiate 1 limb protection strategy during B/D for 1 OT session   Skilled Therapeutic Interventions/Progress Updates:  Session 1   Pt greeted semi-reclined in bed and agreeable to treatment session. Pt continues to have aphasia but able to express needs 50% of the time with 1 word such as "shave". Pt completed stand-pivot transfers bed>wc>toilet with min/mod  A and facilitation at hips to pivot. Pt voided bladder on commode and completed peri-care with set-up. Bathing/dressing completed seated in wc at the sink with multiple sit<>stands with min A and OT facilitating weight bearing through L UE in standing for neuro re-ed. Hand over hand A to integrate L UE into bathing tasks. Pt with 1 small absent seizures lasting approximately 1 minute. Stand-pivot to recliner at end of session with safety belt on and LEs elevated for safety 2/2 possibility of seizures.   Session 2 Pt greeted seated in recliner and agreeable to OT. Provided pt with soft tan theraputty for L NMR with weight bearing through hand. Pt with stronger shoulder/elbow, but very weak hand with no functional grasp. 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 Placed bulilt up hand of reel using foam and povided hand over hand to maintain grasp while pt pushed from shoulder to spin reel. Continued L UE NMR with OT providing joint  input through elbow/wrist and pt pushing and pulling. Pt left seated in recliner at end of session with safety belt on and daughter present.   Therapy Documentation Precautions:  Precautions Precautions: Fall Precaution Comments: expressive aphasia, L hemiplgia, impulsive Restrictions Weight Bearing Restrictions: No Pain:  none/denies pain   Therapy/Group: Individual Therapy  Valma Cava 03/17/2018, 9:17 AM

## 2018-03-18 ENCOUNTER — Inpatient Hospital Stay (HOSPITAL_COMMUNITY): Payer: Medicare Other | Admitting: Physical Therapy

## 2018-03-18 ENCOUNTER — Inpatient Hospital Stay (HOSPITAL_COMMUNITY): Payer: Medicare Other

## 2018-03-18 ENCOUNTER — Inpatient Hospital Stay (HOSPITAL_COMMUNITY): Payer: Medicare Other | Admitting: Occupational Therapy

## 2018-03-18 ENCOUNTER — Inpatient Hospital Stay (HOSPITAL_COMMUNITY): Payer: Medicare Other | Admitting: Speech Pathology

## 2018-03-18 LAB — BASIC METABOLIC PANEL
ANION GAP: 8 (ref 5–15)
BUN: 25 mg/dL — ABNORMAL HIGH (ref 8–23)
CO2: 27 mmol/L (ref 22–32)
Calcium: 8.4 mg/dL — ABNORMAL LOW (ref 8.9–10.3)
Chloride: 97 mmol/L — ABNORMAL LOW (ref 98–111)
Creatinine, Ser: 0.91 mg/dL (ref 0.61–1.24)
GFR calc Af Amer: >60 mL/min (ref >60)
GFR calc non Af Amer: >60 mL/min (ref >60)
Glucose, Bld: 240 mg/dL — ABNORMAL HIGH (ref 70–99)
Potassium: 5 mmol/L (ref 3.5–5.1)
SODIUM: 132 mmol/L — AB (ref 135–145)

## 2018-03-18 LAB — GLUCOSE, CAPILLARY
GLUCOSE-CAPILLARY: 191 mg/dL — AB (ref 70–99)
Glucose-Capillary: 210 mg/dL — ABNORMAL HIGH (ref 70–99)
Glucose-Capillary: 228 mg/dL — ABNORMAL HIGH (ref 70–99)
Glucose-Capillary: 174 mg/dL — ABNORMAL HIGH (ref 70–99)

## 2018-03-18 NOTE — Progress Notes (Signed)
Speech Language Pathology Weekly Progress and Session Note  Patient Details  Name: Weylyn Ricciuti MRN: [id number] Date of Birth: 13-Mar-1941  Beginning of progress report period: March 11, 2018 End of progress report period: March 18, 2018  Today's Date: 03/18/2018 SLP Individual Time: 3762-8315 SLP Individual Time Calculation (min): 45 min  Short Term Goals: Week 1: SLP Short Term Goal 1 (Week 1): Pt will name familiar objects with ~ 90% accuracy and Mod A cues.  SLP Short Term Goal 1 - Progress (Week 1): Met SLP Short Term Goal 2 (Week 1): Pt will express wants and needs using multimodal means with Mod A cues in 7 out of 10 opportunities.  SLP Short Term Goal 2 - Progress (Week 1): Met SLP Short Term Goal 3 (Week 1): Pt will read at the word level with ~ 75% accuracy and Mod A cues.  SLP Short Term Goal 3 - Progress (Week 1): Met SLP Short Term Goal 4 (Week 1): Pt will self-monitor and self-correct verbal errors in 7 out of 10 opportunities with Mod A cues.  SLP Short Term Goal 4 - Progress (Week 1): Not met SLP Short Term Goal 5 (Week 1): Pt will demonstrate selective attention to task in mildly distracting environment for ~ 15 minutes with Min A cues.  SLP Short Term Goal 5 - Progress (Week 1): Met SLP Short Term Goal 6 (Week 1): Pt will complete basic familiar tasks with Min A cues.  SLP Short Term Goal 6 - Progress (Week 1): Met    New Short Term Goals: Week 2: SLP Short Term Goal 1 (Week 2): Pt will name familiar objects with ~ 90% accuracy and Min A cues.  SLP Short Term Goal 2 (Week 2): Pt will express wants and needs using multimodal means with Min A cues in 7 out of 10 opportunities.  SLP Short Term Goal 3 (Week 2): Pt will read at the phrase level with ~ 75% accuracy and Mod A cues.  SLP Short Term Goal 4 (Week 2): Pt will self-monitor and self-correct verbal errors in 7 out of 10 opportunities with Mod A cues.  SLP Short Term Goal 5 (Week 2): Pt will demonstrate  selective attention to task in mildly distracting environment for ~ 15 minutes with supervision  cues.  SLP Short Term Goal 6 (Week 2): Pt will complete basic familiar tasks with supervision A cues.   Weekly Progress Updates: Patient has made functional gains and has met 6 of 6 STGs this reporting period. 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 When patient demonstrates difficulty verbalizing his wants/needs, he independently utilizes gestures. Patient can also read at the word level with 100% accuracy but demonstrates increased difficulty at the phrase level, suspect due to function words. Patient has also made gains in both attention to tasks and basic problem solving. Patient and family education ongoing. Patient would benefit from continued skilled SLP intervention to maximize his cognitive-linguistic function and overall functional independence prior to discharge.      Intensity: Minumum of 1-2 x/day, 30 to 90 minutes Frequency: 3 to 5 out of 7 days Duration/Length of Stay: 1/15 Treatment/Interventions: Speech/Language facilitation;Patient/family education;Dysphagia/aspiration precaution training;Cognitive remediation/compensation;Environmental controls;Internal/external aids;Functional tasks;Cueing hierarchy;Therapeutic Activities   Daily Session  Skilled Therapeutic Interventions: Skilled treatment session focused on cognitive-linguistic goals. Upon arrival, patient was asleep in bed but easily awakened to  voice. Patient indicated he had not received his breakfast meal, therefore, SLP provided meal set-up. Throughout set-up patient able to verbalize his wants/needs at the word level with extra time. Patient also alternated attention between self-feeding and basic conversation with overall supervision verbal cues for ~30 minutes and  answered basic questions about personal and relevant information with at the word level with overall supervision verbal cues and extra time. Patient appeared much more alert and awake compared to yesterday's session. Patient left upright in bed with alarm on and all needs within reach. Continue with current plan of care.      Pain No/Denies Pain   Therapy/Group: Individual Therapy  Alegria Dominique 03/18/2018, 3:52 PM

## 2018-03-18 NOTE — Progress Notes (Signed)
Physical Therapy Session Note  Patient Details  Name: Rupert Azzara MRN: [id number] Date of Birth: 02-15-1941  Today's Date: 03/18/2018 PT Individual Time: 1300-1341 PT Individual Time Calculation (min): 41 min   Short Term Goals: Week 2:  PT Short Term Goal 1 (Week 2): pt will perform gait x 50' with min A in controlled environment PT Short Term Goal 2 (Week 2): pt will perform functional transfers consistently with min A  Skilled Therapeutic Interventions/Progress Updates:    pt in chair states "I'm tired" but agreeable to therapy.  Pt performs stand pivot transfers throughout session with mod A, mod cuing for motor planning and safety.  Gait with RW 20' x 2, 81' with min A with Lt ankle ace wrapped to improve dorsiflexion.  Standing reaching task for wt shifting and postural control with min manual facilitation.  Sit <> stand without UEs with min A for strengthening and balance.  kinetron in sitting 3 x 1 minute for LE strength and endurance. Pt left in recliner with alarm set, needs at hand.  Therapy Documentation Precautions:  Precautions Precautions: Fall Precaution Comments: expressive aphasia, L hemiplgia, impulsive Restrictions Weight Bearing Restrictions: No Pain:  no c/o pain   Therapy/Group: Individual Therapy  Erdine Hulen 03/18/2018, 1:43 PM

## 2018-03-18 NOTE — Progress Notes (Signed)
Physical Therapy Weekly Progress Note  Patient Details  Name: Bryan [name] MRN: [id number] Date of Birth: 1940/09/28  Beginning of progress report period: March 12, 2018 End of progress report period: March 18, 2018   Patient has met 1 of 4 short term goals.  Pt limited by seizure activity which limited safe participation in therapy. Pt is motivated and seizures are improving and pt has started to make functional gains.  Patient continues to demonstrate the following deficits muscle weakness, impaired timing and sequencing, abnormal tone, decreased coordination and decreased motor planning and decreased standing balance, decreased postural control, hemiplegia and decreased balance strategies and therefore will continue to benefit from skilled PT intervention to increase functional independence with mobility.  Patient progressing toward long term goals..  Continue plan of care.  PT Short Term Goals Week 1:  PT Short Term Goal 1 (Week 1): Pt will perfrom sit<>stnad with min assist consistently  PT Short Term Goal 1 - Progress (Week 1): Met PT Short Term Goal 2 (Week 1): Pt will ambuate 167f with min assist and LRAD  PT Short Term Goal 2 - Progress (Week 1): Progressing toward goal PT Short Term Goal 3 (Week 1): Pt will propell WC [id number] supervision assist  PT Short Term Goal 3 - Progress (Week 1): Progressing toward goal PT Short Term Goal 4 (Week 1): Pt wil perform bed<>WC transfer with min assist consistently  PT Short Term Goal 4 - Progress (Week 1): Progressing toward goal Week 2:  PT Short Term Goal 1 (Week 2): pt will perform gait x 50' with min A in controlled environment PT Short Term Goal 2 (Week 2): pt will perform functional transfers consistently with min A  Skilled Therapeutic Interventions/Progress Updates:  Ambulation/gait training;Balance/vestibular training;Cognitive remediation/compensation;Community reintegration;Discharge planning;DME/adaptive equipment  instruction;Functional electrical stimulation;Neuromuscular re-education;Functional mobility training;Pain management;Patient/family education;Stair training;Splinting/orthotics;Therapeutic Activities;Therapeutic Exercise;UE/LE Strength taining/ROM;UE/LE Coordination activities;Wheelchair propulsion/positioning     Darryel Diodato 03/18/2018, 7:33 AM

## 2018-03-18 NOTE — Progress Notes (Signed)
[facility] PHYSICAL MEDICINE & REHABILITATION PROGRESS NOTE  Subjective/Complaints: No new issues. Slept well.   ROS: Patient denies fever, rash, sore throat, blurred vision, nausea, vomiting, diarrhea, cough, shortness of breath or chest pain, joint or back pain, headache, or mood change.   Objective: Vital Signs: Blood pressure (!) 143/73, pulse 65, temperature 97.9 F (36.6 C), temperature source Oral, resp. rate 18, height 5\' 5"  (1.651 m), weight 77.7 kg, SpO2 97 %. No results found. No results for input(s): WBC, HGB, HCT, PLT in the last 72 hours. Recent Labs    03/18/18 0523  NA 132*  K 5.0  CL 97*  CO2 27  GLUCOSE 240*  BUN 25*  CREATININE 0.91  CALCIUM 8.4*    Physical Exam: BP (!) 143/73 (BP Location: Right Arm)   Pulse 65   Temp 97.9 F (36.6 C) (Oral)   Resp 18   Ht 5\' 5"  (1.651 m)   Wt 77.7 kg   SpO2 97%   BMI 28.51 kg/m  Constitutional: No distress . Vital signs reviewed. HEENT: EOMI, oral membranes moist, crani scar. Neck: supple Cardiovascular: RRR without murmur. No JVD    Respiratory: CTA Bilaterally without wheezes or rales. Normal effort    GI: BS +, non-tender, non-distended  Musculoskeletal: No edema or tenderness in extremities  Neurological: He is alert Expressive language deficits.  Motor: Left upper extremity: Shoulder abduction, elbow flexion/extension 3-4/5, handgrip 2+ to 3-/5, limited participation, unchanged Left lower extremity: 3/5 proximal distal but inconsistent Skin: Skin is warm and dry.  See above  Psychiatric: alert, pleasant  Assessment/Plan: 1. 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  Physiatrist is providing close team supervision and 24 hour management of active medical problems listed below.  Physiatrist and rehab team continue to assess barriers to discharge/monitor patient  progress toward functional and medical goals  Care Tool:  Bathing    Body parts bathed by patient: Chest, Abdomen, Front perineal area, Right upper leg, Left upper leg, Face, Buttocks, Right lower leg, Left lower leg   Body parts bathed by helper: Right arm, Left arm     Bathing assist Assist Level: Minimal Assistance - Patient > 75%     Upper Body Dressing/Undressing Upper body dressing   What is the patient wearing?: Hospital gown only    Upper body assist Assist Level: Minimal Assistance - Patient > 75%    Lower Body Dressing/Undressing Lower body dressing      What is the patient wearing?: Underwear/pull up, Pants     Lower body assist Assist for lower body dressing: Maximal Assistance - Patient 25 - 49%(increased assist due to time constraints)     Toileting Toileting    Toileting assist Assist for toileting: Maximal Assistance - Patient 25 - 49%     Transfers Chair/bed transfer  Transfers assist     Chair/bed transfer assist level: Moderate Assistance - Patient 50 - 74%     Locomotion Ambulation   Ambulation assist      Assist level: Minimal Assistance - Patient > 75% Assistive device: Walker-rolling Max distance: 40 ft   Walk 10 feet activity   Assist     Assist level: Minimal Assistance - Patient > 75% Assistive device: Walker-rolling   Walk 50 feet activity   Assist    Assist level: Moderate Assistance - Patient - 50 - 74% Assistive device: Hand held assist  Walk 150 feet activity   Assist    Assist level: Moderate Assistance - Patient - 50 - 74% Assistive device: Hand held assist    Walk 10 feet on uneven surface  activity   Assist Walk 10 feet on uneven surfaces activity did not occur: Safety/medical concerns         Wheelchair     Assist Will patient use wheelchair at discharge?: Yes Type of Wheelchair: Manual    Wheelchair assist level: Minimal Assistance - Patient > 75% Max wheelchair distance:  150ft    Wheelchair 50 feet with 2 turns activity    Assist        Assist Level: Minimal Assistance - Patient > 75%   Wheelchair 150 feet activity     Assist     Assist Level: Supervision/Verbal cueing      Medical Problem List and Plan: 1.  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 Status post craniotomy 03/07/2018. Patient was to follow up with Dr.Vaslow and Dr. Tammi Klippel on discharge.   Continue CIR  Decadron  decreased to q12 today 12/26--taper further Monday 2.  DVT Prophylaxis/Anticoagulation: SCDs. 3. Pain Management: Tylenol as needed 4. Mood:  Provide emotional support 5. Neuropsych: This patient is not capable of making decisions on his own behalf. 6. Skin/Wound Care:  Routine skin checks 7. Fluids/Electrolytes/Nutrition:  Routine in and out's  8. Seizure prophylaxis.     Keppra increased to 1500 twice daily   consider addition of Vimpat if persistent breakthrough seizures  Alert at present with current meds 9. Hypertension.   Lisinopril increased to 15 mg on 12/24  Improved control 12/26 10.  Steroid-induced hyperglycemia on prediabetes. Glucophage 1000 mg twice a day. Monitor closely while on tapering steroids  Elevated this morning 12/27  Cover with SSI as we taper steroids 11. Hyperlipidemia. Lipitor 12.  Hyponatremia  Sodium 134 on 12/23  Recheck monday 13.  Hypoalbuminemia  Supplement initiated on 12/23 14.  Acute blood loss anemia  Hemoglobin 10.9 on 12/23  Continue to monitor  LOS: 7 days A FACE TO FACE EVALUATION WAS PERFORMED  Meredith Staggers 03/18/2018, 9:59 AM

## 2018-03-18 NOTE — Progress Notes (Signed)
Occupational Therapy Session Note  Patient Details  Name: Bryan [name] MRN: [id number] Date of Birth: Dec 13, 1940  Today's Date: 03/18/2018 OT Individual Time: 1105-1200 OT Individual Time Calculation (min): 55 min   Short Term Goals: Week 1:  OT Short Term Goal 1 (Week 1): Pt will maintain sitting balance on toilet or BSC with supervision assist while voiding  OT Short Term Goal 2 (Week 1): Pt will complete sit<stand during LB self care with Min A  OT Short Term Goal 3 (Week 1): Pt will initiate 1 limb protection strategy during B/D for 1 OT session   Skilled Therapeutic Interventions/Progress Updates:    Pt greeted seated in recliner and agreeable to OT treatment session. Asked pt if he needed to go to the bathroom and he stated "yes." Stand-pivot onto commode with mod A and verbal cues for hand placement and LLE positioning with pivot. Mod A for standing balance while OT managed clothing. Pt able to sit on commode and void bladder successfully. Mod A again for standing balance when standing for OT to pull pants 2/2 Lateral lean to the L. Verbal and tactile cues to maintain L LE knee/hip extension in standing. Pt washed hands seated at the sink with set-up A and HOH to reach to turn on faucet with L hand. Pt trying to gesture that he needed something, then was able to say "shoes." Max A to don and tie shoes seated in wc. UB there-ex on [employer] arm bike with OT using ace wrap to keep L UE on handle. 3, 1 minute intervals of only using L UE with improved shoulder activation to push and pull handle. OT traded out wc cushion for smaller one so pt can stand easier from wc height. Stand-pivot back to recliner at end of session with min A verbal cues for technique. Pt left seated in recliner with safety belt on and call bell in reach.   Therapy Documentation Precautions:  Precautions Precautions: Fall Precaution Comments: expressive aphasia, L hemiplgia, impulsive Restrictions Weight Bearing  Restrictions: No Pain:   none/denies pain  Therapy/Group: Individual Therapy  Valma Cava 03/18/2018, 11:30 AM

## 2018-03-18 NOTE — Progress Notes (Signed)
Occupational Therapy Session Note  Patient Details  Name: Bryan [name] MRN: [id number] Date of Birth: 06-07-1940  Today's Date: 03/18/2018 OT Individual Time: 0915-1000 OT Individual Time Calculation (min): 45 min    Short Term Goals: Week 1:  OT Short Term Goal 1 (Week 1): Pt will maintain sitting balance on toilet or BSC with supervision assist while voiding  OT Short Term Goal 2 (Week 1): Pt will complete sit<stand during LB self care with Min A  OT Short Term Goal 3 (Week 1): Pt will initiate 1 limb protection strategy during B/D for 1 OT session   Skilled Therapeutic Interventions/Progress Updates:    1;1. No pain reported Pt received in bed reporting need to toilet. Pt completes stand pivot transfer to face toilet, despite cuing to sit on toilet to void bladder. Pt already with incontinent episode in brief, however able to stand with min A to attempt to void again. Pt with 1 siezure lasting aprox 45 sec seated in w/c. Pt bathes UB with A for back and HOH A to wash RUE with LUE grasp facilitation. Pt washes peri area and declines washing feet, OT washes buttocks in standing.Pt able to thread BLE into underwear but unable to fix errors when threading pants onto feet requiring max A for pants. Pt sit to stand with MOD A overall to advance pants past L hip with LUE in WB on sink. Exited session with pt seated in w/c with daughter present brushing teeth.   Therapy Documentation Precautions:  Precautions Precautions: Fall Precaution Comments: expressive aphasia, L hemiplgia, impulsive Restrictions Weight Bearing Restrictions: No General:   Vital Signs:  Therapy/Group: Individual Therapy  Tonny Branch 03/18/2018, 10:01 AM

## 2018-03-19 ENCOUNTER — Inpatient Hospital Stay (HOSPITAL_COMMUNITY): Payer: Medicare Other

## 2018-03-19 ENCOUNTER — Inpatient Hospital Stay (HOSPITAL_COMMUNITY): Payer: Medicare Other | Admitting: Occupational Therapy

## 2018-03-19 DIAGNOSIS — D62 Acute posthemorrhagic anemia: Secondary | ICD-10-CM

## 2018-03-19 LAB — GLUCOSE, CAPILLARY
GLUCOSE-CAPILLARY: 196 mg/dL — AB (ref 70–99)
Glucose-Capillary: 238 mg/dL — ABNORMAL HIGH (ref 70–99)
Glucose-Capillary: 307 mg/dL — ABNORMAL HIGH (ref 70–99)
Glucose-Capillary: 194 mg/dL — ABNORMAL HIGH (ref 70–99)

## 2018-03-19 NOTE — Plan of Care (Signed)
  Problem: Consults Goal: RH STROKE PATIENT EDUCATION Description See Patient Education module for education specifics  Outcome: Progressing Goal: Nutrition Consult-if indicated Outcome: Progressing   Problem: RH BOWEL ELIMINATION Goal: RH STG MANAGE BOWEL WITH ASSISTANCE Description STG Manage Bowel with min-mod Assistance.  Outcome: Progressing Goal: RH STG MANAGE BOWEL W/MEDICATION W/ASSISTANCE Description STG Manage Bowel with Medication with min-mod Assistance.  Outcome: Progressing   Problem: RH BLADDER ELIMINATION Goal: RH STG MANAGE BLADDER WITH ASSISTANCE Description STG Manage Bladder With min-mod  Assistance  Outcome: Progressing Goal: RH STG MANAGE BLADDER WITH MEDICATION WITH ASSISTANCE Description STG Manage Bladder With Medication With min-mod Assistance.  Outcome: Progressing Goal: RH STG MANAGE BLADDER WITH EQUIPMENT WITH ASSISTANCE Description STG Manage Bladder With Equipment With min-mod  Assistance  Outcome: Progressing   Problem: RH SKIN INTEGRITY Goal: RH STG SKIN FREE OF INFECTION/BREAKDOWN Outcome: Progressing Goal: RH STG MAINTAIN SKIN INTEGRITY WITH ASSISTANCE Description STG Maintain Skin Integrity With min Assistance.  Outcome: Progressing   Problem: RH SAFETY Goal: RH STG ADHERE TO SAFETY PRECAUTIONS W/ASSISTANCE/DEVICE Description STG Adhere to Safety Precautions With min-mod Assistance/Device.  Outcome: Progressing   Problem: RH COGNITION-NURSING Goal: RH STG USES MEMORY AIDS/STRATEGIES W/ASSIST TO PROBLEM SOLVE Description STG Uses Memory Aids/Strategies With min Assistance to Problem Solve.  Outcome: Progressing   Problem: RH PAIN MANAGEMENT Goal: RH STG PAIN MANAGED AT OR BELOW PT'S PAIN GOAL Outcome: Progressing   Problem: RH KNOWLEDGE DEFICIT Goal: RH STG INCREASE KNOWLEDGE OF DIABETES Outcome: Progressing Goal: RH STG INCREASE KNOWLEDGE OF DYSPHAGIA/FLUID INTAKE Outcome: Progressing Goal: RH STG INCREASE KNOWLEGDE OF  HYPERLIPIDEMIA Outcome: Progressing Goal: RH STG INCREASE KNOWLEDGE OF STROKE PROPHYLAXIS Outcome: Progressing

## 2018-03-19 NOTE — Progress Notes (Signed)
Occupational Therapy Session Note  Patient Details  Name: Bryan [name] MRN: [id number] Date of Birth: [birth year]  Today's Date: 03/19/2018 OT Individual Time: 1400-1500 OT Individual Time Calculation (min): 60 min    Short Term Goals: Week 1:  OT Short Term Goal 1 (Week 1): Pt will maintain sitting balance on toilet or BSC with supervision assist while voiding  OT Short Term Goal 2 (Week 1): Pt will complete sit<stand during LB self care with Min A  OT Short Term Goal 3 (Week 1): Pt will initiate 1 limb protection strategy during B/D for 1 OT session  Week 2:    Week 3:     Skilled Therapeutic Interventions/Progress Updates:    1:1 Focus on functional communication, functional transfers, clothing management, bilateral UE tasks, functional problem solving. Pt expressed he wanted to do the kinentron. Pt transferred stand pivot onto the seat and performed 2 reps of 3 min each.  Then transitioned to standing with min A with facilitation for increased left knee terminal extension. Noted pt's pants were wet. Pt transported back to the room and perform stand pivot with mod A to the toilet. Pt with difficulty with advancement of left foot. Total A for clothing management and to doff wet clothing. Pt did A with hygiene with setup and performed sit to stand with min A for Ot to was bottom. Also voided on the toilet. Changed clothing with focus on hemi dressing and awareness of left UE's "job" during all tasks. Pt transitioned to the sink and A with hand washing, hair washing, and shaving all with focus on bilateral hand tasks. Ambulated ~10 feet in the room from sink to recliner next window with walker with hand splint with mod A with multi modal cues for left foot awareness when stepping.   Therapy Documentation Precautions:  Precautions Precautions: Fall Precaution Comments: expressive aphasia, L hemiplgia, impulsive Restrictions Weight Bearing Restrictions: No Pain: Pain Assessment Pain Score:  0-No pain   Therapy/Group: Individual Therapy  Willeen Cass [facility] 03/19/2018, 3:05 PM

## 2018-03-19 NOTE — Progress Notes (Signed)
Speech Language Pathology Daily Session Note  Patient Details  Name: Auren Valdes MRN: [id number] Date of Birth: 1940-08-15  Today's Date: 03/19/2018 SLP Individual Time: 1000-1100 SLP Individual Time Calculation (min): 60 min  Short Term Goals: Week 2: SLP Short Term Goal 1 (Week 2): Pt will name familiar objects with ~ 90% accuracy and Min A cues.  SLP Short Term Goal 2 (Week 2): Pt will express wants and needs using multimodal means with Min A cues in 7 out of 10 opportunities.  SLP Short Term Goal 3 (Week 2): Pt will read at the phrase level with ~ 75% accuracy and Mod A cues.  SLP Short Term Goal 4 (Week 2): Pt will self-monitor and self-correct verbal errors in 7 out of 10 opportunities with Mod A cues.  SLP Short Term Goal 5 (Week 2): Pt will demonstrate selective attention to task in mildly distracting environment for ~ 15 minutes with supervision  cues.  SLP Short Term Goal 6 (Week 2): Pt will complete basic familiar tasks with supervision A cues.   Skilled Therapeutic Interventions:Skilled ST services focused on cognitive skills. SLP facilitated reading at phrase level, utilizing closure phase from [place], given visual cues of two objects, pt demonstrated 60% accuracy with mod A verbal cues and matched object to phrase in a field of two with 100% accuracy. SLP facilitated generating name of object following pt reading closure phrases, previously used in first exercise, with 60% accuracy. Pt read at word level with 60% accuracy without visual aids of objects present. Pt demonstrated ability to name objects from [facility] toolkit, with 90% accuracy given min A verbal cues. Pt demonstrated ability to express wants/needs while placing meal order with multimodal means given mod A verbal cues with 60% accuracy.  Pt was left in room with call bell within reach and bed alarm set. SLP reccomends to continue skilled services.     Pain Pain Assessment Pain Score: 0-No pain  Therapy/Group:  Individual Therapy  [place]line Semrad  [facility] 03/19/2018, 12:24 PM

## 2018-03-19 NOTE — Progress Notes (Addendum)
[place] PHYSICAL MEDICINE & REHABILITATION PROGRESS NOTE  Subjective/Complaints: Patient seen lying in bed this morning.  He indicates he slept well overnight.  ROS: Limited due to aphasia, however appears to deny chest pain, shortness of breath, nausea, vomiting, diarrhea.  Objective: Vital Signs: Blood pressure 140/70, pulse 73, temperature 97.6 F (36.4 C), resp. rate 18, height 5\' 5"  (1.651 m), weight 77.7 kg, SpO2 98 %. No results found. No results for input(s): WBC, HGB, HCT, PLT in the last 72 hours. Recent Labs    03/18/18 0523  NA 132*  K 5.0  CL 97*  CO2 27  GLUCOSE 240*  BUN 25*  CREATININE 0.91  CALCIUM 8.4*    Physical Exam: BP 140/70 (BP Location: Right Arm)   Pulse 73   Temp 97.6 F (36.4 C)   Resp 18   Ht 5\' 5"  (1.651 m)   Wt 77.7 kg   SpO2 98%   BMI 28.51 kg/m  Constitutional: No distress . Vital signs reviewed. HENT: Normocephalic.  Atraumatic. Eyes: EOMI. No discharge. Cardiovascular: RRR. No JVD. Respiratory: CTA Bilaterally. Normal effort. GI: BS +. Non-distended. Musc: No edema or tenderness in extremities. Neurological: He is alert Expressive aphasia Motor: Left upper extremity: Shoulder abduction, elbow flexion/extension 3-4/5, handgrip 2+ to 3-/5, limited participation, unchanged Left lower extremity: 3/5 proximal distal but inconsistent Skin: Skin is warm and dry.  Scalp sutures C/D/I Psychiatric: alert, pleasant  Assessment/Plan: 1. 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  Physiatrist is providing close team supervision and 24 hour management of active medical problems listed below.  Physiatrist and rehab team continue to assess barriers to discharge/monitor patient progress toward functional and medical goals  Care Tool:  Bathing    Body parts bathed by patient: Chest, Abdomen, Front  perineal area, Right upper leg, Left upper leg, Face, Left arm(declined Lower LE)   Body parts bathed by helper: Right arm, Buttocks     Bathing assist Assist Level: Minimal Assistance - Patient > 75%     Upper Body Dressing/Undressing Upper body dressing   What is the patient wearing?: Pull over shirt    Upper body assist Assist Level: Maximal Assistance - Patient 25 - 49%    Lower Body Dressing/Undressing Lower body dressing      What is the patient wearing?: Underwear/pull up, Pants     Lower body assist Assist for lower body dressing: Maximal Assistance - Patient 25 - 49%     Toileting Toileting    Toileting assist Assist for toileting: Maximal Assistance - Patient 25 - 49%     Transfers Chair/bed transfer  Transfers assist     Chair/bed transfer assist level: Minimal Assistance - Patient > 75%     Locomotion Ambulation   Ambulation assist      Assist level: Moderate Assistance - Patient 50 - 74% Assistive device: Walker-rolling Max distance: 40 ft   Walk 10 feet activity   Assist     Assist level: Moderate Assistance - Patient - 50 - 74% Assistive device: Walker-rolling   Walk 50 feet activity   Assist    Assist level: Moderate Assistance - Patient - 50 - 74% Assistive device: Hand held assist    Walk 150 feet activity   Assist    Assist level: Moderate Assistance - Patient - 50 - 74% Assistive device: Hand held assist    Walk 10 feet on  uneven surface  activity   Assist Walk 10 feet on uneven surfaces activity did not occur: Safety/medical concerns         Wheelchair     Assist Will patient use wheelchair at discharge?: Yes Type of Wheelchair: Manual    Wheelchair assist level: Minimal Assistance - Patient > 75% Max wheelchair distance: 175ft    Wheelchair 50 feet with 2 turns activity    Assist        Assist Level: Minimal Assistance - Patient > 75%   Wheelchair 150 feet activity     Assist      Assist Level: Supervision/Verbal cueing      Medical Problem List and Plan: 1.  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 Status post craniotomy 03/07/2018. Patient was to follow up with Dr.Vaslow and Dr. Tammi Klippel on discharge.   Continue CIR  Decadron decreased to q12 on 12/26--taper further Monday 2.  DVT Prophylaxis/Anticoagulation: SCDs. 3. Pain Management: Tylenol as needed 4. Mood:  Provide emotional support 5. Neuropsych: This patient is not capable of making decisions on his own behalf. 6. Skin/Wound Care:  Routine skin checks 7. Fluids/Electrolytes/Nutrition:  Routine in and out's  8. Seizure prophylaxis.   Keppra increased to 1500 twice daily  Consider addition of Vimpat if persistent breakthrough seizures 9. Hypertension.   Lisinopril increased to 15 mg on 12/24  Controlled on 12/28 10.  Steroid-induced hyperglycemia on prediabetes. Glucophage 1000 mg twice a day. Monitor closely while on tapering steroids  Elevated on 12/28  Cover with SSI as we taper steroids 11. Hyperlipidemia. Lipitor 12.  Hyponatremia  Sodium 132 on 12/27  Recheck Monday 13.  Hypoalbuminemia  Supplement initiated on 12/23 14.  Acute blood loss anemia  Hemoglobin 10.9 on 12/23  Continue to monitor 15. Borderline K+  5.0 on 12/27  Labs ordered for Monday  LOS: 8 days A FACE TO FACE EVALUATION WAS PERFORMED  Ankit [name] 03/19/2018, 8:30 AM

## 2018-03-20 ENCOUNTER — Inpatient Hospital Stay (HOSPITAL_COMMUNITY): Payer: Medicare Other

## 2018-03-20 ENCOUNTER — Inpatient Hospital Stay (HOSPITAL_COMMUNITY): Payer: Medicare Other | Admitting: Occupational Therapy

## 2018-03-20 LAB — GLUCOSE, CAPILLARY
Glucose-Capillary: 187 mg/dL — ABNORMAL HIGH (ref 70–99)
Glucose-Capillary: 162 mg/dL — ABNORMAL HIGH (ref 70–99)
Glucose-Capillary: 263 mg/dL — ABNORMAL HIGH (ref 70–99)
Glucose-Capillary: 192 mg/dL — ABNORMAL HIGH (ref 70–99)

## 2018-03-20 NOTE — Progress Notes (Signed)
[facility] PHYSICAL MEDICINE & REHABILITATION PROGRESS NOTE  Subjective/Complaints: Patient seen laying in bed this morning.  He states he slept well overnight.  He tends to nod affirmative to all questions.  ROS: Limited due to aphasia  Objective: Vital Signs: Blood pressure (!) 165/76, pulse 69, temperature (!) 97.4 F (36.3 C), resp. rate 20, height 5\' 5"  (1.651 m), weight 77.7 kg, SpO2 99 %. No results found. No results for input(s): WBC, HGB, HCT, PLT in the last 72 hours. Recent Labs    03/18/18 0523  NA 132*  K 5.0  CL 97*  CO2 27  GLUCOSE 240*  BUN 25*  CREATININE 0.91  CALCIUM 8.4*    Physical Exam: BP (!) 165/76 (BP Location: Right Arm)   Pulse 69   Temp (!) 97.4 F (36.3 C)   Resp 20   Ht 5\' 5"  (1.651 m)   Wt 77.7 kg   SpO2 99%   BMI 28.51 kg/m  Constitutional: No distress . Vital signs reviewed. HENT: Normocephalic.  Atraumatic. Eyes: EOMI. No discharge. Cardiovascular: RRR.  No JVD. Respiratory: CTA bilaterally.  Normal effort. GI: BS +. Non-distended. Musc: No edema or tenderness in extremities. Neurological: He is alert Expressive aphasia Motor: Left upper extremity: Shoulder abduction, elbow flexion/extension 3-4/5, handgrip 2+ to 3-/5, limited participation, stable Left lower extremity: 3/5 proximal distal but inconsistent Skin: Skin is warm and dry.  Scalp sutures C/D/I Psychiatric: alert, pleasant  Assessment/Plan: 1. 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  Physiatrist is providing close team supervision and 24 hour management of active medical problems listed below.  Physiatrist and rehab team continue to assess barriers to discharge/monitor patient progress toward functional and medical goals  Care Tool:  Bathing    Body parts bathed by patient: Chest, Abdomen, Front perineal area, Right upper  leg, Left upper leg, Face, Left arm(declined Lower LE)   Body parts bathed by helper: Right arm, Buttocks     Bathing assist Assist Level: Minimal Assistance - Patient > 75%     Upper Body Dressing/Undressing Upper body dressing   What is the patient wearing?: Pull over shirt    Upper body assist Assist Level: Maximal Assistance - Patient 25 - 49%    Lower Body Dressing/Undressing Lower body dressing      What is the patient wearing?: Underwear/pull up, Pants     Lower body assist Assist for lower body dressing: Maximal Assistance - Patient 25 - 49%     Toileting Toileting    Toileting assist Assist for toileting: Maximal Assistance - Patient 25 - 49%     Transfers Chair/bed transfer  Transfers assist     Chair/bed transfer assist level: Minimal Assistance - Patient > 75%     Locomotion Ambulation   Ambulation assist      Assist level: Moderate Assistance - Patient 50 - 74% Assistive device: Walker-rolling Max distance: 40 ft   Walk 10 feet activity   Assist     Assist level: Moderate Assistance - Patient - 50 - 74% Assistive device: Walker-rolling   Walk 50 feet activity   Assist    Assist level: Moderate Assistance - Patient - 50 - 74% Assistive device: Hand held assist    Walk 150 feet activity   Assist    Assist level: Moderate Assistance - Patient - 50 - 74% Assistive device: Hand held assist    Walk  10 feet on uneven surface  activity   Assist Walk 10 feet on uneven surfaces activity did not occur: Safety/medical concerns         Wheelchair     Assist Will patient use wheelchair at discharge?: Yes Type of Wheelchair: Manual    Wheelchair assist level: Minimal Assistance - Patient > 75% Max wheelchair distance: 111ft    Wheelchair 50 feet with 2 turns activity    Assist        Assist Level: Minimal Assistance - Patient > 75%   Wheelchair 150 feet activity     Assist     Assist Level:  Supervision/Verbal cueing      Medical Problem List and Plan: 1.  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 Status post craniotomy 03/07/2018. Patient was to follow up with Dr.Vaslow and Dr. Tammi Klippel on discharge.   Continue CIR  Decadron decreased to q12 on 12/26--taper further tomorrow 2.  DVT Prophylaxis/Anticoagulation: SCDs. 3. Pain Management: Tylenol as needed 4. Mood:  Provide emotional support 5. Neuropsych: This patient is not capable of making decisions on his own behalf. 6. Skin/Wound Care:  Routine skin checks 7. Fluids/Electrolytes/Nutrition:  Routine in and out's  8. Seizures.   Keppra increased to 1500 twice daily  Consider addition of Vimpat if persistent breakthrough seizures  Controlled at present 9. Hypertension.   Lisinopril increased to 15 mg on 12/24  Elevated on 12/29, consider further medication changes if persistently elevated 10.  Steroid-induced hyperglycemia on prediabetes. Glucophage 1000 mg twice a day. Monitor closely while on tapering steroids  Elevated on 12/29  Cover with SSI as we taper steroids 11. Hyperlipidemia. Lipitor 12.  Hyponatremia  Sodium 132 on 12/27  Recheck tomorrow 13.  Hypoalbuminemia  Supplement initiated on 12/23 14.  Acute blood loss anemia  Hemoglobin 10.9 on 12/23  Continue to monitor 15. Borderline K+  5.0 on 12/27  Labs ordered for tomorrow  LOS: 9 days A FACE TO FACE EVALUATION WAS PERFORMED  Ankit [name] 03/20/2018, 7:49 AM

## 2018-03-20 NOTE — Progress Notes (Signed)
Occupational Therapy Session Note  Patient Details  Name: Bryan [name] MRN: [id number] Date of Birth: [birth year]  Today's Date: 03/20/2018 OT Individual Time: 5374-8270 OT Individual Time Calculation (min): 58 min    Short Term Goals: Week 1:  OT Short Term Goal 1 (Week 1): Pt will maintain sitting balance on toilet or BSC with supervision assist while voiding  OT Short Term Goal 2 (Week 1): Pt will complete sit<stand during LB self care with Min A  OT Short Term Goal 3 (Week 1): Pt will initiate 1 limb protection strategy during B/D for 1 OT session   Skilled Therapeutic Interventions/Progress Updates:    Pt seen for OT ADL bathing/dressing session. Pt awake in supine upon arrival, agreeable to tx session. He verbalized desire to shower this morning.  He transferred to sitting EOB with supervision and VCs for sequencing.  Throughout session, completed stand pivot transfer with steadying assist and VCs for safety 2/2 impulsivity. Stand pivot to toilet, assist for clothing management.  He bathed in shower seated on padded tub bench. Shower cap worn to cover surgical site. Mod A bathing due to limited strength and coordination in L UE. He stood with use of grab bars with supervision while therapist provided total Afor buttock hygiene. He returned to w/c to dress, mod cuing for word finding to name needed clothing items. Min cuing for use of hemi dressing technique. UB completed with min A. Min A to maintain L LE in figure four position in order to thread LE into pants and don socks. He stood with RW and steadying assist to pull up pants, assist to advance over R hip.  He ambulated ~10 ft from w/c to recliner, min-mod A with VCs for awareness and placement of L LE. Cont with impulsive behaviors requiring VCs for safety and awareness. Pt left seated in recliner at end of session, chair belt alarm donned and all needs in reach, awaiting MT for assist with breakfast.   Therapy  Documentation Precautions:  Precautions Precautions: Fall Precaution Comments: expressive aphasia, L hemiplgia, impulsive Restrictions Weight Bearing Restrictions: No Pain:   No/denies pain   Therapy/Group: Individual Therapy  Ysela Hettinger L 03/20/2018, 6:41 AM

## 2018-03-20 NOTE — Progress Notes (Signed)
Occupational Therapy Session Note  Patient Details  Name: Bryan [name] MRN: [id number] Date of Birth: 18-Jan-1941  Today's Date: 03/20/2018 OT Individual Time: 1300-1340 OT Individual Time Calculation (min): 40 min    Short Term Goals: Week 1:  OT Short Term Goal 1 (Week 1): Pt will maintain sitting balance on toilet or BSC with supervision assist while voiding  OT Short Term Goal 2 (Week 1): Pt will complete sit<stand during LB self care with Min A  OT Short Term Goal 3 (Week 1): Pt will initiate 1 limb protection strategy during B/D for 1 OT session   Skilled Therapeutic Interventions/Progress Updates:    1;1. Received in bed reporting need to toilet and no pain. Pt completes stand pivot transfers througout session MOD A to L (pt requries max VC for LLE advancement) and min A to R with grab bar/bed rails EOB<>w/c<>BSC. Pt requires MAX A for standing balance and knee block while pt completes clothing management with A for over L hip. Pt voids bladder on BSC. Pt don shoes with OT installing shoe button with white coban on laces as visual cue to fasten to white button. Pt able to complete with min VC to don B shoes. Pt reaches for bean bags with facilitation of grasp/release of LUE across midline and places into bucket on L. Pt demo trace activation of digit flexion, but only voluntarily activating flexors 30% of trials. Exited session with pt seated in bed, call light in reach and all needs met  Therapy Documentation Precautions:  Precautions Precautions: Fall Precaution Comments: expressive aphasia, L hemiplgia, impulsive Restrictions Weight Bearing Restrictions: No General:    Therapy/Group: Individual Therapy  Tonny Branch 03/20/2018, 1:43 PM

## 2018-03-21 ENCOUNTER — Inpatient Hospital Stay (HOSPITAL_COMMUNITY): Payer: Medicare Other | Admitting: Speech Pathology

## 2018-03-21 ENCOUNTER — Telehealth: Payer: Self-pay | Admitting: *Deleted

## 2018-03-21 ENCOUNTER — Inpatient Hospital Stay (HOSPITAL_COMMUNITY): Payer: Medicare Other

## 2018-03-21 ENCOUNTER — Inpatient Hospital Stay (HOSPITAL_COMMUNITY): Payer: Medicare Other | Admitting: Occupational Therapy

## 2018-03-21 LAB — BASIC METABOLIC PANEL
Anion gap: 12 (ref 5–15)
BUN: 24 mg/dL — ABNORMAL HIGH (ref 8–23)
CO2: 24 mmol/L (ref 22–32)
Calcium: 8.5 mg/dL — ABNORMAL LOW (ref 8.9–10.3)
Chloride: 97 mmol/L — ABNORMAL LOW (ref 98–111)
Creatinine, Ser: 0.96 mg/dL (ref 0.61–1.24)
GFR calc Af Amer: >60 mL/min (ref >60)
GFR calc non Af Amer: >60 mL/min (ref >60)
Glucose, Bld: 229 mg/dL — ABNORMAL HIGH (ref 70–99)
Potassium: 4.7 mmol/L (ref 3.5–5.1)
Sodium: 133 mmol/L — ABNORMAL LOW (ref 135–145)

## 2018-03-21 LAB — GLUCOSE, CAPILLARY
Glucose-Capillary: 204 mg/dL — ABNORMAL HIGH (ref 70–99)
Glucose-Capillary: 159 mg/dL — ABNORMAL HIGH (ref 70–99)
Glucose-Capillary: 261 mg/dL — ABNORMAL HIGH (ref 70–99)
Glucose-Capillary: 195 mg/dL — ABNORMAL HIGH (ref 70–99)

## 2018-03-21 MED ORDER — DEXAMETHASONE 2 MG PO TABS
2.0000 mg | ORAL_TABLET | Freq: Two times a day (BID) | ORAL | Status: DC
  Administered 2018-03-21 – 2018-03-24 (×6): 2 mg via ORAL
  Filled 2018-03-21 (×6): qty 1

## 2018-03-21 NOTE — Progress Notes (Signed)
Occupational Therapy Session Note  Patient Details  Name: Bryan [name] MRN: [id number] Date of Birth: 1941-01-06  Today's Date: 03/21/2018 OT Individual Time: 1085-7907 OT Individual Time Calculation (min): 60 min   Short Term Goals: Week 1:  OT Short Term Goal 1 (Week 1): Pt will maintain sitting balance on toilet or BSC with supervision assist while voiding  OT Short Term Goal 2 (Week 1): Pt will complete sit<stand during LB self care with Min A  OT Short Term Goal 3 (Week 1): Pt will initiate 1 limb protection strategy during B/D for 1 OT session   Skilled Therapeutic Interventions/Progress Updates:    Pt greeted semi-reclined in bed with nurse tech present stating pt needed to go to the bathroom. Stand-pivot transfer bed>wc>toilet>shower bench with min A overall. Pt with successful BM on the commode. Worked on toileting and standing balance with pt needing mod A for balance when reaching to toilet 2/2 lateral lean to the L. Stand-pivot into shower using grab bars with min A and improved head/hips relationship. L UE NMR within bathing tasks w/ hand over hand A. Pt with improved finger flex/ext, but unable to grasp wash cloth. Good shoulder activation to then assist with bathing. Dressing completed wc at the sink with verbal cues for hemi-dressing techniques and overall mod A for LB dressing and min A for UB dressing. Facilitated weight bearing through L UE at the sink with all sit<>stands. Pt left seated in wc at end of session with call bell in hand and needs met.   Therapy Documentation Precautions:  Precautions Precautions: Fall Precaution Comments: expressive aphasia, L hemiplgia, impulsive Restrictions Weight Bearing Restrictions: No Pain:   none/denies pain  Therapy/Group: Individual Therapy  Valma Cava 03/21/2018, 9:41 AM

## 2018-03-21 NOTE — Progress Notes (Signed)
Speech Language Pathology Daily Session Note  Patient Details  Name: Bryan [name] MRN: [id number] Date of Birth: April 28, 1940  Today's Date: 03/21/2018 SLP Individual Time: 1045-1130 SLP Individual Time Calculation (min): 45 min  Short Term Goals: Week 2: SLP Short Term Goal 1 (Week 2): Pt will name familiar objects with ~ 90% accuracy and Min A cues.  SLP Short Term Goal 2 (Week 2): Pt will express wants and needs using multimodal means with Min A cues in 7 out of 10 opportunities.  SLP Short Term Goal 3 (Week 2): Pt will read at the phrase level with ~ 75% accuracy and Mod A cues.  SLP Short Term Goal 4 (Week 2): Pt will self-monitor and self-correct verbal errors in 7 out of 10 opportunities with Mod A cues.  SLP Short Term Goal 5 (Week 2): Pt will demonstrate selective attention to task in mildly distracting environment for ~ 15 minutes with supervision  cues.  SLP Short Term Goal 6 (Week 2): Pt will complete basic familiar tasks with supervision A cues.   Skilled Therapeutic Interventions: Skilled treatment session focused on speech goals. SLP facilitated session by providing extra time and Mod A verbal and visual cues for use of a carrier phrase during a naming task with functional items. Patient named functional items with 100% accuracy. Throughout functional conversation, patient noted to utilize increased length of utterances and function words. Patient's wife present and educated in regards to patient's progress and strategies to utilize to maximize verbal expression. Patient left upright in wheelchair with all needs within reach and family present. Continue with current plan of care.      Pain Pain Assessment Pain Scale: 0-10 Pain Score: 0-No pain  Therapy/Group: Individual Therapy  Bryan [name] 03/21/2018, 3:34 PM

## 2018-03-21 NOTE — Progress Notes (Signed)
Physical Therapy Session Note  Patient Details  Name: Bryan [name] MRN: [id number] Date of Birth: 20-May-1940  Today's Date: 03/21/2018 PT Individual Time: 1000-1045 PT Individual Time Calculation (min): 45 min   Short Term Goals: Week 2:  PT Short Term Goal 1 (Week 2): pt will perform gait x 50' with min A in controlled environment PT Short Term Goal 2 (Week 2): pt will perform functional transfers consistently with min A  Skilled Therapeutic Interventions/Progress Updates:    Pt seated in w/c upon PT arrival, agreeable to therapy tx and denies pain. Therapist assisted to don socks and shoes total assist for time management. Pt transported to the gym in w/c. Pt ambulated x 50 ft with RW and min assist, verbal cues for heel strike with L LE and attention to L side. Pt ambulated x 50 ft and x 100 ft this session with RW and min assist, toe cap added to L shoe for increased clearance, verbal cues for heel strike/step length, increased verbal cueing with turning to sit for L attention and techniques. Pt transferred to tall kneeling this session with min assist, pt maintained tall kneeling position for neuro re-ed while performed cup flipping task with UEs, hand over hand assist for L UE use. Pt transferred to modified quadruped on elbows using bench, performed reaching task with R UE towards L side for increased L weightshift, attention and weightbearing. Pt worked on dynamic standing balance without UE support to perform toe taps on 4 inch step working on foot clearance with L LE and then L stance control while stepping with R LE. Pt transferred to w/c stand pivot min assist and transported back to room, left in care of wife.   Therapy Documentation Precautions:  Precautions Precautions: Fall Precaution Comments: expressive aphasia, L hemiplgia, impulsive Restrictions Weight Bearing Restrictions: No    Therapy/Group: Individual Therapy  Netta Corrigan, PT, DPT 03/21/2018, 7:24 AM

## 2018-03-21 NOTE — Telephone Encounter (Signed)
Left message with wife to schedule visit with Dr Mickeal Skinner.  Ideally we need to schedule before visit with Dr Mickeal Skinner.  Pending call back.

## 2018-03-21 NOTE — Progress Notes (Addendum)
Physical Therapy Session Note  Patient Details  Name: Bryan [name] MRN: [id number] Date of Birth: 06/13/1940  Today's Date: 03/21/2018 PT Individual Time: 1412-1505 PT Individual Time Calculation (min): 53 min   Short Term Goals:  Week 2:  PT Short Term Goal 1 (Week 2): pt will perform gait x 50' with min A in controlled environment PT Short Term Goal 2 (Week 2): pt will perform functional transfers consistently with min A  Skilled Therapeutic Interventions/Progress Updates:   Pt sitting up in w/c.  Wife and son present.  Stand pivot transfer w/c> mat with min/mod assist.  Therapeutic activity, in sitting with bil LE support fading to 0UE support, reaching out of BOS to R/L or forward with either hand, to facilitate trunk shortening/lengthening/rotating,  while retrieving cups and placing them next to contralateral hip on mat.  Reciprocal scooting in sitting, to activate L pelvic muscles, forward/backward with some carryover after manual assistance.  Biased -to-L sit>< stand to match playing cards on board in front of him, x 9. Pt accurately matched cards 8/9 without cues.   PT educated wife and son of simple uses of pt's L hand that will promote wrist extension and hand function.  Gait training over level tile x 90' with min> mod assist as he fatigued, with RW, L hand splint, and shoe cover on L shoe to assist with foot clearance. As he fatigued, pt progressed  L foot by sliding it along floor; mod cues for slower velocity and attention to LLE.   Pt stated that he needed to use toilet.  Toilet transfer with min assist and wall bar, cues to doff pants before sitting.  Pt continent of urine.  Janique, NT entered room to stay with pt as PT left to see next pt.     Therapy Documentation Precautions:  Precautions Precautions: Fall Precaution Comments: expressive aphasia, L hemiplgia, impulsive Restrictions Weight Bearing Restrictions: No Pain: Pain Assessment Pain Scale: 0-10 Pain  Score: 0-No pain    Therapy/Group: Individual Therapy  Nyshaun Standage 03/21/2018, 4:29 PM

## 2018-03-21 NOTE — Progress Notes (Signed)
[facility] PHYSICAL MEDICINE & REHABILITATION PROGRESS NOTE  Subjective/Complaints: Pt up in bed. Eating breakfast without difficulties. Cut own french toast  ROS: limited due to language/communication    Objective: Vital Signs: Blood pressure 133/73, pulse 70, temperature (!) 97.5 F (36.4 C), temperature source Oral, resp. rate 19, height 5\' 5"  (1.651 m), weight 77.7 kg, SpO2 98 %. No results found. No results for input(s): WBC, HGB, HCT, PLT in the last 72 hours. Recent Labs    03/21/18 0543  NA 133*  K 4.7  CL 97*  CO2 24  GLUCOSE 229*  BUN 24*  CREATININE 0.96  CALCIUM 8.5*    Physical Exam: BP 133/73 (BP Location: Right Arm)   Pulse 70   Temp (!) 97.5 F (36.4 C) (Oral)   Resp 19   Ht 5\' 5"  (1.651 m)   Wt 77.7 kg   SpO2 98%   BMI 28.51 kg/m  Constitutional: No distress . Vital signs reviewed. HEENT: EOMI, oral membranes moist Neck: supple Cardiovascular: RRR without murmur. No JVD    Respiratory: CTA Bilaterally without wheezes or rales. Normal effort    GI: BS +, non-tender, non-distended  Musc: No edema or tenderness in extremities. Neurological: He is alert Expressive aphasia ongoing. Follows simple commands.  Motor: Left upper extremity: Shoulder abduction, elbow flexion/extension 3-4/5, handgrip 2+ to 3-/5, limited participation, stable Left lower extremity: 3/5 proximal distally  Skin: Skin is warm and dry.  Scalp sutures C/D/I Psychiatric: alert, pleasant  Assessment/Plan: 1. 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  Physiatrist is providing close team supervision and 24 hour management of active medical problems listed below.  Physiatrist and rehab team continue to assess barriers to discharge/monitor patient progress toward functional and medical goals  Care Tool:  Bathing    Body parts bathed by  patient: Chest, Abdomen, Front perineal area, Right upper leg, Left upper leg, Face, Left arm, Right lower leg, Left lower leg   Body parts bathed by helper: Right arm, Buttocks     Bathing assist Assist Level: Moderate Assistance - Patient 50 - 74%     Upper Body Dressing/Undressing Upper body dressing   What is the patient wearing?: Pull over shirt    Upper body assist Assist Level: Minimal Assistance - Patient > 75%    Lower Body Dressing/Undressing Lower body dressing      What is the patient wearing?: Underwear/pull up, Pants     Lower body assist Assist for lower body dressing: Minimal Assistance - Patient > 75%     Toileting Toileting    Toileting assist Assist for toileting: Moderate Assistance - Patient 50 - 74%     Transfers Chair/bed transfer  Transfers assist     Chair/bed transfer assist level: Minimal Assistance - Patient > 75%     Locomotion Ambulation   Ambulation assist      Assist level: Moderate Assistance - Patient 50 - 74% Assistive device: Walker-rolling Max distance: 40 ft   Walk 10 feet activity   Assist     Assist level: Moderate Assistance - Patient - 50 - 74% Assistive device: Walker-rolling   Walk 50 feet activity   Assist    Assist level: Moderate Assistance - Patient - 50 - 74% Assistive device: Hand held assist    Walk 150 feet activity   Assist    Assist level: Moderate Assistance - Patient - 86 -  74% Assistive device: Hand held assist    Walk 10 feet on uneven surface  activity   Assist Walk 10 feet on uneven surfaces activity did not occur: Safety/medical concerns         Wheelchair     Assist Will patient use wheelchair at discharge?: Yes Type of Wheelchair: Manual    Wheelchair assist level: Minimal Assistance - Patient > 75% Max wheelchair distance: 161ft    Wheelchair 50 feet with 2 turns activity    Assist        Assist Level: Minimal Assistance - Patient > 75%    Wheelchair 150 feet activity     Assist     Assist Level: Supervision/Verbal cueing      Medical Problem List and Plan: 1.  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 Status post craniotomy 03/07/2018. Patient was to follow up with Dr.Vaslow and Dr. Tammi Klippel on discharge.   Continue CIR  Decadron decreased to q12 on 12/26--taper to 2mg  q12 today 2.  DVT Prophylaxis/Anticoagulation: SCDs. 3. Pain Management: Tylenol as needed 4. Mood:  Provide emotional support 5. Neuropsych: This patient is not capable of making decisions on his own behalf. 6. Skin/Wound Care:  Routine skin checks  -remove sutures today 7. Fluids/Electrolytes/Nutrition:  Routine in and out's  8. Seizures.   Keppra increased to 1500 twice daily  Consider addition of Vimpat if persistent breakthrough seizures  Controlled at present 9. Hypertension.   Lisinopril increased to 15 mg on 12/24  Elevated on 12/29, consider further medication changes if persistently elevated 10.  Steroid-induced hyperglycemia on prediabetes. Glucophage 1000 mg twice a day. Monitor closely while on tapering steroids  Elevated on 12/30  Continue to cover with SSI as we taper steroids 11. Hyperlipidemia. Lipitor 12.  Hyponatremia  Sodium 133 12/30  I personally reviewed the patient's labs today.   13.  Hypoalbuminemia  Supplement initiated on 12/23 14.  Acute blood loss anemia  Hemoglobin 10.9 on 12/23  Continue to monitor 15. Borderline K+  4.7 on 12/30     LOS: 10 days A FACE TO [place] 03/21/2018, 9:20 AM

## 2018-03-22 ENCOUNTER — Inpatient Hospital Stay (HOSPITAL_COMMUNITY): Payer: Medicare Other | Admitting: Speech Pathology

## 2018-03-22 ENCOUNTER — Inpatient Hospital Stay (HOSPITAL_COMMUNITY): Payer: Medicare Other | Admitting: Occupational Therapy

## 2018-03-22 ENCOUNTER — Inpatient Hospital Stay (HOSPITAL_COMMUNITY): Payer: Medicare Other | Admitting: Physical Therapy

## 2018-03-22 LAB — GLUCOSE, CAPILLARY
GLUCOSE-CAPILLARY: 193 mg/dL — AB (ref 70–99)
Glucose-Capillary: 175 mg/dL — ABNORMAL HIGH (ref 70–99)
Glucose-Capillary: 239 mg/dL — ABNORMAL HIGH (ref 70–99)
Glucose-Capillary: 171 mg/dL — ABNORMAL HIGH (ref 70–99)

## 2018-03-22 NOTE — Progress Notes (Signed)
Physical Therapy Session Note  Patient Details  Name: Bryan [name] MRN: [id number] Date of Birth: 10-Nov-1940  Today's Date: 03/22/2018 PT Individual Time: 1100-1200 PT Individual Time Calculation (min): 60 min   Short Term Goals: Week 2:  PT Short Term Goal 1 (Week 2): pt will perform gait x 50' with min A in controlled environment PT Short Term Goal 2 (Week 2): pt will perform functional transfers consistently with min A  Skilled Therapeutic Interventions/Progress Updates:    Patient received up in [facility], pleasant and willing to participate in PT session today, actually able to state short appropriate statements and phrases to therapist today! Performed gait training with RW and L hand orthosis, initial gait distance 39ft with MinA and difficulty with clearing toe L foot and often getting foot "stuck" on floor and losing balance anteriorly. Applied toe cover and patient then able to gait train approximatley 148ft with MinA, RW, and L hand orthosis with improved clearance of L foot but began to lose balance anteriorly again due to difficulty clearing L LE when fatigued. Transported to day room totalA in [facility] and transferred to Nustep with MinA, tolerated riding Nustep for 10 minutes with B LEs on resistance 3, then performed standing/reaching task at table while grasping yellow clothespins with modA L UE. Daughter present and observed majority of session, education provided on gait mechanics as well as HHPT, silver sneakers, possible restrictions at gym (HR and weight for resistance training), and neuroplasticity. He was left up in [facility] in care of daughter planning to go off unit to eat lunch, educated to let nursing know when they are back so that seat belt alarm can be reapplied.   Therapy Documentation Precautions:  Precautions Precautions: Fall Precaution Comments: expressive aphasia, L hemiplgia, impulsive Restrictions Weight Bearing Restrictions: No General:   Vital Signs:  Pain: Pain  Assessment Pain Scale: 0-10 Pain Score: 0-No pain    Therapy/Group: Individual Therapy  Deniece Ree PT, DPT, CBIS  Supplemental Physical Therapist [facility]    Pager [phone] Acute Rehab Office [phone]   03/22/2018, 12:19 PM

## 2018-03-22 NOTE — Progress Notes (Signed)
Occupational Therapy Weekly Progress Note  Patient Details  Name: Bryan [name] MRN: [id number] Date of Birth: 1941-02-15  Beginning of progress report period: March 12, 2018 End of progress report period: March 22, 2018  Today's Date: 03/22/2018  Session 1 OT Individual Time: 8250-5397 OT Individual Time Calculation (min): 60 min   Session 2 OT Individual Time: 6734-1937 OT Individual Time Calculation (min): 42 min    Patient has met 3 of 3 short term goals.  Pt is making steady progress towards OT goals. Pt has progressed to an overall min A level within BADL tasks including functional toilet and shower transfers. Pt will occasionally have lateral LOB to the L .2.2 L hemiplegia, and require mod A to correct. L UE functional continues to improve with proximal shoulder strength greater hand hand/wrist. Pt has demonstrated some finger flex/ext, but unable to achieve functional grasp. Continue current POC.  Patient continues to demonstrate the following deficits: muscle weakness, impaired timing and sequencing, abnormal tone, unbalanced muscle activation, decreased coordination and decreased motor planning, decreased initiation, decreased awareness, decreased problem solving and decreased safety awareness and decreased sitting balance, decreased standing balance, decreased postural control, hemiplegia and decreased balance strategies and therefore will continue to benefit from skilled OT intervention to enhance overall performance with BADL.  Patient progressing toward long term goals..  Continue plan of care.  OT Short Term Goals Week 1:  OT Short Term Goal 1 (Week 1): Pt will maintain sitting balance on toilet or BSC with supervision assist while voiding  OT Short Term Goal 1 - Progress (Week 1): Met OT Short Term Goal 2 (Week 1): Pt will complete sit<stand during LB self care with Min A  OT Short Term Goal 2 - Progress (Week 1): Met OT Short Term Goal 3 (Week 1): Pt will initiate  1 limb protection strategy during B/D for 1 OT session  OT Short Term Goal 3 - Progress (Week 1): Met Week 2:  OT Short Term Goal 1 (Week 2): Pt will use L UE for 50% of bathing tasks using velcro wash cloth OT Short Term Goal 2 (Week 2): Pt will maintain standing balance within BADL tasks with no more than min A OT Short Term Goal 3 (Week 2): Pt will complete toilet transfer with consistent min A  Skilled Therapeutic Interventions/Progress Updates:    Session 1 Pt greeted semi-reclined in bed and agreeable to OT. Pt stated "shower" when asked what pt wanted to do this morning. Stand-pivot bed>wc>tub bench with overall min A and 1 LOB when transferring left into shower requiring mod A to correct. Provided pt with velvco mit for L hand. Pt needed assistance to don wash cloth, then was able to use L UE for bathing 30% of body with OT providing HOH assist at times 2.2 fatigue. Min verbal cues to recall hemi-dressing techniques and educated pt on one handed strategy to don socks. Facilitated weight bearing the L UE with all sit><>stands. Educated pt on hemi techniques for grooming tasks including use of L UE as a stabilizer to hold toothbrush while applying toothpaste. Pt with improved word finding today! Pt left seated in wc at end of session with alarm belt on, call bell in reach, and needs met.   Session 2 Pt greeted seated in wc with son present and agreeable to OT treatment session focused on L UE NMR. Standing towel pushes to facilitate weight bearing through L UE- high repetition activity of 4 sets of 20. Incorporated counting within activity  with pt able to state number but had difficulty counting in sequence of each push. Focus on wrist extension with OT providing active assist to facilitate extensors. Incorporated B UE by having R hand mirror the same motion and L. Educated pt on self-ROM there-ex. Worked on functional use of L UE with cup stacking activity. OT provided guided A to facilitate finger  extension for grasp, then pt able to place on top of cups in R hand.  Pt returned to room at end of session and left seated in wc with safety alarm belt on, call bell in reach, and friend present.    Therapy Documentation Precautions:  Precautions Precautions: Fall Precaution Comments: expressive aphasia, L hemiplgia, impulsive Restrictions Weight Bearing Restrictions: No Pain: Pain Assessment Pain Scale: 0-10 Pain Score: 0-No pain  Therapy/Group: Individual Therapy  Valma Cava 03/22/2018, 2:58 PM

## 2018-03-22 NOTE — Progress Notes (Signed)
Speech Language Pathology Daily Session Note  Patient Details  Name: Sherlock Nancarrow MRN: [id number] Date of Birth: [birth year]  Today's Date: 03/22/2018 SLP Individual Time: 1000-1045 SLP Individual Time Calculation (min): 45 min  Short Term Goals: Week 2: SLP Short Term Goal 1 (Week 2): Pt will name familiar objects with ~ 90% accuracy and Min A cues.  SLP Short Term Goal 2 (Week 2): Pt will express wants and needs using multimodal means with Min A cues in 7 out of 10 opportunities.  SLP Short Term Goal 3 (Week 2): Pt will read at the phrase level with ~ 75% accuracy and Mod A cues.  SLP Short Term Goal 4 (Week 2): Pt will self-monitor and self-correct verbal errors in 7 out of 10 opportunities with Mod A cues.  SLP Short Term Goal 5 (Week 2): Pt will demonstrate selective attention to task in mildly distracting environment for ~ 15 minutes with supervision  cues.  SLP Short Term Goal 6 (Week 2): Pt will complete basic familiar tasks with supervision A cues.   Skilled Therapeutic Interventions: Skilled treatment session focused on communication goals. SLP facilitated session by providing Mod A verbal cues for patient to describe the function of a functional item at the phrase level during a structured verbal task. Patient also initiated a conversation about his family and was able to verbalize basic information about his family with extra time and overall Mod A multimodal cues. Patient also participated in singing of familiar songs with overall Mod A verbal cues for word-finding. Patient left upright in wheelchair with all needs within reach and alarm on. Continue with current plan of care.      Pain Pain Assessment Pain Scale: 0-10 Pain Score: 0-No pain  Therapy/Group: Individual Therapy  Deforrest Bogle 03/22/2018, 2:17 PM

## 2018-03-22 NOTE — Plan of Care (Signed)
  Problem: Consults Goal: RH STROKE PATIENT EDUCATION Description See Patient Education module for education specifics  Outcome: Progressing Goal: Nutrition Consult-if indicated Outcome: Progressing   Problem: RH BOWEL ELIMINATION Goal: RH STG MANAGE BOWEL WITH ASSISTANCE Description STG Manage Bowel with min-mod Assistance.  Outcome: Progressing Goal: RH STG MANAGE BOWEL W/MEDICATION W/ASSISTANCE Description STG Manage Bowel with Medication with min-mod Assistance.  Outcome: Progressing   Problem: RH BLADDER ELIMINATION Goal: RH STG MANAGE BLADDER WITH ASSISTANCE Description STG Manage Bladder With min-mod  Assistance  Outcome: Progressing Goal: RH STG MANAGE BLADDER WITH MEDICATION WITH ASSISTANCE Description STG Manage Bladder With Medication With min-mod Assistance.  Outcome: Progressing Goal: RH STG MANAGE BLADDER WITH EQUIPMENT WITH ASSISTANCE Description STG Manage Bladder With Equipment With min-mod  Assistance  Outcome: Progressing   Problem: RH SKIN INTEGRITY Goal: RH STG SKIN FREE OF INFECTION/BREAKDOWN Outcome: Progressing Goal: RH STG MAINTAIN SKIN INTEGRITY WITH ASSISTANCE Description STG Maintain Skin Integrity With min Assistance.  Outcome: Progressing   Problem: RH SAFETY Goal: RH STG ADHERE TO SAFETY PRECAUTIONS W/ASSISTANCE/DEVICE Description STG Adhere to Safety Precautions With min-mod Assistance/Device.  Outcome: Progressing   Problem: RH COGNITION-NURSING Goal: RH STG USES MEMORY AIDS/STRATEGIES W/ASSIST TO PROBLEM SOLVE Description STG Uses Memory Aids/Strategies With min Assistance to Problem Solve.  Outcome: Progressing   Problem: RH PAIN MANAGEMENT Goal: RH STG PAIN MANAGED AT OR BELOW PT'S PAIN GOAL Outcome: Progressing   Problem: RH KNOWLEDGE DEFICIT Goal: RH STG INCREASE KNOWLEDGE OF DIABETES Outcome: Progressing Goal: RH STG INCREASE KNOWLEDGE OF DYSPHAGIA/FLUID INTAKE Outcome: Progressing Goal: RH STG INCREASE KNOWLEGDE OF  HYPERLIPIDEMIA Outcome: Progressing Goal: RH STG INCREASE KNOWLEDGE OF STROKE PROPHYLAXIS Outcome: Progressing

## 2018-03-22 NOTE — Progress Notes (Signed)
[place] PHYSICAL MEDICINE & REHABILITATION PROGRESS NOTE  Subjective/Complaints: Up eating breafkast. In good spirits. Denies pain  ROS: limited due to language/communication   Objective: Vital Signs: Blood pressure (!) 141/81, pulse 72, temperature 98.2 F (36.8 C), resp. rate 16, height 5\' 5"  (1.651 m), weight 77.7 kg, SpO2 96 %. No results found. No results for input(s): WBC, HGB, HCT, PLT in the last 72 hours. Recent Labs    03/21/18 0543  NA 133*  K 4.7  CL 97*  CO2 24  GLUCOSE 229*  BUN 24*  CREATININE 0.96  CALCIUM 8.5*    Physical Exam: BP (!) 141/81 (BP Location: Right Arm)   Pulse 72   Temp 98.2 F (36.8 C)   Resp 16   Ht 5\' 5"  (1.651 m)   Wt 77.7 kg   SpO2 96%   BMI 28.51 kg/m  Constitutional: No distress . Vital signs reviewed. HEENT: EOMI, oral membranes moist Neck: supple Cardiovascular: RRR without murmur. No JVD    Respiratory: CTA Bilaterally without wheezes or rales. Normal effort    GI: BS +, non-tender, non-distended  Musc: No edema or tenderness in extremities. Neurological: He is alert Expressive aphasia ongoing. Follows simple commands.  Motor: Left upper extremity: Shoulder abduction, elbow flexion/extension 3-4/5, handgrip 2+ to 3-/5 stable Left lower extremity: 3 to+/5 proximal distally  Skin: Skin is warm and dry.  Scalp wound CDI Psychiatric: alert, pleasant  Assessment/Plan: 1. 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  Physiatrist is providing close team supervision and 24 hour management of active medical problems listed below.  Physiatrist and rehab team continue to assess barriers to discharge/monitor patient progress toward functional and medical goals  Care Tool:  Bathing    Body parts bathed by patient: Chest, Abdomen, Front perineal area, Right upper leg, Left upper leg, Face, Left  arm, Right lower leg, Left lower leg, Right arm   Body parts bathed by helper: Buttocks     Bathing assist Assist Level: Minimal Assistance - Patient > 75%     Upper Body Dressing/Undressing Upper body dressing   What is the patient wearing?: Pull over shirt    Upper body assist Assist Level: Minimal Assistance - Patient > 75%    Lower Body Dressing/Undressing Lower body dressing      What is the patient wearing?: Underwear/pull up, Pants     Lower body assist Assist for lower body dressing: Contact Guard/Touching assist     Toileting Toileting    Toileting assist Assist for toileting: Minimal Assistance - Patient > 75%     Transfers Chair/bed transfer  Transfers assist     Chair/bed transfer assist level: Minimal Assistance - Patient > 75%     Locomotion Ambulation   Ambulation assist      Assist level: Minimal Assistance - Patient > 75% Assistive device: Other (comment)(L hand orthosis, toe cover ) Max distance: 149ft    Walk 10 feet activity   Assist     Assist level: Minimal Assistance - Patient > 75% Assistive device: Orthosis, Walker-rolling(L hand orthosis and toe cap )   Walk 50 feet activity   Assist    Assist level: Minimal Assistance - Patient > 75% Assistive device: Walker-rolling, Orthosis(L hand orthosis, toe cover )    Walk 150 feet activity   Assist    Assist level: Moderate Assistance - Patient - 50 - 74% Assistive  device: Hand held assist    Walk 10 feet on uneven surface  activity   Assist Walk 10 feet on uneven surfaces activity did not occur: Safety/medical concerns         Wheelchair     Assist Will patient use wheelchair at discharge?: Yes Type of Wheelchair: Manual    Wheelchair assist level: Minimal Assistance - Patient > 75% Max wheelchair distance: 145ft    Wheelchair 50 feet with 2 turns activity    Assist        Assist Level: Minimal Assistance - Patient > 75%   Wheelchair 150  feet activity     Assist     Assist Level: Supervision/Verbal cueing      Medical Problem List and Plan: 1.  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 Status post craniotomy 03/07/2018. Patient was to follow up with Dr.Vaslow and Dr. Tammi Klippel on discharge.   -Interdisciplinary Team Conference today    Decadron decreased to q12 on 12/26--tapered to 2mg  q12 on 12/30 2.  DVT Prophylaxis/Anticoagulation: SCDs. 3. Pain Management: Tylenol as needed 4. Mood:  Provide emotional support 5. Neuropsych: This patient is not capable of making decisions on his own behalf. 6. Skin/Wound Care:  Routine skin checks  -removed sutures   7. Fluids/Electrolytes/Nutrition:  Routine in and out's  8. Seizures.   Keppra increased to 1500 twice daily  Consider addition of Vimpat if persistent breakthrough seizures  Controlled at present 9. Hypertension.   Lisinopril increased to 15 mg on 12/24  Elevated on 12/29, consider further medication changes if persistently elevated 10.  Steroid-induced hyperglycemia on prediabetes. Glucophage 1000 mg twice a day. Monitor closely while on tapering s  CBG's falling  Continue to cover with SSI as we taper steroids 11. Hyperlipidemia. Lipitor 12.  Hyponatremia  Sodium 133 12/30  I personally reviewed the patient's labs today.   13.  Hypoalbuminemia  Supplement initiated on 12/23 14.  Acute blood loss anemia  Hemoglobin 10.9 on 12/23  Continue to monitor 15. Borderline K+  4.7 on 12/30     LOS: 11 days A FACE TO [place] 03/22/2018, 1:40 PM

## 2018-03-23 ENCOUNTER — Inpatient Hospital Stay (HOSPITAL_COMMUNITY): Payer: Medicare Other | Admitting: Occupational Therapy

## 2018-03-23 ENCOUNTER — Inpatient Hospital Stay (HOSPITAL_COMMUNITY): Payer: Medicare Other | Admitting: Physical Therapy

## 2018-03-23 LAB — GLUCOSE, CAPILLARY
Glucose-Capillary: 200 mg/dL — ABNORMAL HIGH (ref 70–99)
Glucose-Capillary: 216 mg/dL — ABNORMAL HIGH (ref 70–99)
Glucose-Capillary: 160 mg/dL — ABNORMAL HIGH (ref 70–99)
Glucose-Capillary: 90 mg/dL (ref 70–99)

## 2018-03-23 NOTE — Progress Notes (Signed)
[place] PHYSICAL MEDICINE & REHABILITATION PROGRESS NOTE  Subjective/Complaints: Up in bed. In good spirits. Ready to get started with therapies.   ROS: limited due to language/communication   Objective: Vital Signs: Blood pressure (!) 142/71, pulse 79, temperature (!) 97.5 F (36.4 C), temperature source Oral, resp. rate 15, height 5\' 5"  (1.651 m), weight 77.7 kg, SpO2 96 %. No results found. No results for input(s): WBC, HGB, HCT, PLT in the last 72 hours. Recent Labs    03/21/18 0543  NA 133*  K 4.7  CL 97*  CO2 24  GLUCOSE 229*  BUN 24*  CREATININE 0.96  CALCIUM 8.5*    Physical Exam: BP (!) 142/71 (BP Location: Right Arm)   Pulse 79   Temp (!) 97.5 F (36.4 C) (Oral)   Resp 15   Ht 5\' 5"  (1.651 m)   Wt 77.7 kg   SpO2 96%   BMI 28.51 kg/m  Constitutional: No distress . Vital signs reviewed. HEENT: EOMI, oral membranes moist Neck: supple Cardiovascular: RRR without murmur. No JVD    Respiratory: CTA Bilaterally without wheezes or rales. Normal effort    GI: BS +, non-tender, non-distended  Musc: No edema or tenderness in extremities. Neurological: He is alert Expressive aphasia ongoing. Putting more words together, expressing thoughts more accurately Motor: Left upper extremity: Shoulder abduction, elbow flexion/extension 3-4/5, handgrip 2+ to 3/5- Left lower extremity: 3 to+/5 proximal distally  Skin: Skin is warm and dry.  Scalp wound CDI Psychiatric: alert, pleasant  Assessment/Plan: 1. 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  Physiatrist is providing close team supervision and 24 hour management of active medical problems listed below.  Physiatrist and rehab team continue to assess barriers to discharge/monitor patient progress toward functional and medical goals  Care Tool:  Bathing    Body parts bathed by  patient: Chest, Abdomen, Front perineal area, Right upper leg, Left upper leg, Face, Left arm, Right lower leg, Left lower leg, Right arm   Body parts bathed by helper: Buttocks     Bathing assist Assist Level: Minimal Assistance - Patient > 75%     Upper Body Dressing/Undressing Upper body dressing   What is the patient wearing?: Pull over shirt    Upper body assist Assist Level: Minimal Assistance - Patient > 75%    Lower Body Dressing/Undressing Lower body dressing      What is the patient wearing?: Underwear/pull up, Pants     Lower body assist Assist for lower body dressing: Contact Guard/Touching assist     Toileting Toileting    Toileting assist Assist for toileting: Minimal Assistance - Patient > 75%     Transfers Chair/bed transfer  Transfers assist     Chair/bed transfer assist level: Minimal Assistance - Patient > 75%     Locomotion Ambulation   Ambulation assist      Assist level: Minimal Assistance - Patient > 75% Assistive device: Other (comment)(L hand orthosis, toe cover ) Max distance: 151ft    Walk 10 feet activity   Assist     Assist level: Minimal Assistance - Patient > 75% Assistive device: Orthosis, Walker-rolling(L hand orthosis and toe cap )   Walk 50 feet activity   Assist    Assist level: Minimal Assistance - Patient > 75% Assistive device: Walker-rolling, Orthosis(L hand orthosis, toe cover )    Walk 150 feet activity   Assist  Assist level: Moderate Assistance - Patient - 50 - 74% Assistive device: Hand held assist    Walk 10 feet on uneven surface  activity   Assist Walk 10 feet on uneven surfaces activity did not occur: Safety/medical concerns         Wheelchair     Assist Will patient use wheelchair at discharge?: Yes Type of Wheelchair: Manual    Wheelchair assist level: Minimal Assistance - Patient > 75% Max wheelchair distance: 123ft    Wheelchair 50 feet with 2 turns  activity    Assist        Assist Level: Minimal Assistance - Patient > 75%   Wheelchair 150 feet activity     Assist     Assist Level: Supervision/Verbal cueing      Medical Problem List and Plan: 1.  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 Status post craniotomy 03/07/2018. Patient was to follow up with Dr.Vaslow and Dr. Tammi Klippel on discharge.   -Continue CIR therapies including PT, OT SLP  Decadron decreased to q12 on 12/26--tapered to 2mg  q12 on 12/30--reduce further tomorrow 2.  DVT Prophylaxis/Anticoagulation: SCDs. 3. Pain Management: Tylenol as needed 4. Mood:  Provide emotional support 5. Neuropsych: This patient is not capable of making decisions on his own behalf. 6. Skin/Wound Care:  Routine skin checks  -removed sutures   7. Fluids/Electrolytes/Nutrition:  Routine in and out's  8. Seizures.   Keppra increased to 1500 twice daily  Consider addition of Vimpat if persistent breakthrough seizures  Controlled at present 9. Hypertension.   Lisinopril increased to 15 mg on 12/24  bp's better with decreasing steroids.  10.  Steroid-induced hyperglycemia on prediabetes. Glucophage 1000 mg twice a day. Monitor closely while on tapering s  CBG's falling  Continue to cover with SSI as we taper steroids 11. Hyperlipidemia. Lipitor 12.  Hyponatremia  Sodium 133 12/30  I personally reviewed the patient's labs today.   13.  Hypoalbuminemia  Supplement initiated on 12/23 14.  Acute blood loss anemia  Hemoglobin 10.9 on 12/23  Continue to monitor 15. Borderline K+  4.7 on 12/30     LOS: 12 days A FACE TO [place] 03/23/2018, 10:57 AM

## 2018-03-23 NOTE — Progress Notes (Signed)
Physical Therapy Session Note  Patient Details  Name: Bryan [name] MRN: [id number] Date of Birth: Jul 19, 1940  Today's Date: 03/23/2018 PT Individual Time: 1002-1040 PT Individual Time Calculation (min): 38 min   Short Term Goals: Week 2:  PT Short Term Goal 1 (Week 2): pt will perform gait x 50' with min A in controlled environment PT Short Term Goal 2 (Week 2): pt will perform functional transfers consistently with min A  Skilled Therapeutic Interventions/Progress Updates: Pt presented in bed brushing teeth and agreeable to therapy. Pt donned shirt minA for threading LUE and required reorientation for threading correctly with RUE. Pt performed supine to sit with CGA and use of bed features with pt noted to have L LOB but was able to correct with use of BUE. PTA donned pants and socks total A for time management and pt performed STS CGA while PTA assisted with clothing management. Pt initially attempting to help PTA with clothes however poor awareness of L leg causing pt to L heavily to L with PTA supporting pt. Moderate cues for pt to correct balance to R with tactile cues and to maintain hold of RW to allow PTA to manage clothing. Once completed pt performed stand pivot to w/c minA and significantly improved sequencing. Pt transported to rehab gym for time management and performed stand pivot transfer to mat. Pt participated in standing balance toe taps single extremity then alternating for increased wt bearing on LLE and tactile cues for increased L quad activation in standing. Pt performed ambulatory transfer back to w/c and transported back to room. Pt remained in w/c at end of session with belt alarm on, call bell within reach and needs met.      Therapy Documentation Precautions:  Precautions Precautions: Fall Precaution Comments: expressive aphasia, L hemiplgia, impulsive Restrictions Weight Bearing Restrictions: No General:   Vital Signs:  Pain: Pain Assessment Pain Scale:  0-10 Pain Score: 0-No pain   Therapy/Group: Individual Therapy  Eithel Ryall  Aviella Disbrow, PTA  03/23/2018, 12:35 PM

## 2018-03-23 NOTE — Progress Notes (Signed)
Speech Language Pathology Daily Make-Up Session Note  Patient Details  Name: Greysen Devino MRN: [id number] Date of Birth: 11/05/1940  Today's Date: 03/23/2018 SLP Individual Time: 7482-7078 SLP Individual Time Calculation (min): 25 min  Short Term Goals: Week 2: SLP Short Term Goal 1 (Week 2): Pt will name familiar objects with ~ 90% accuracy and Min A cues.  SLP Short Term Goal 2 (Week 2): Pt will express wants and needs using multimodal means with Min A cues in 7 out of 10 opportunities.  SLP Short Term Goal 3 (Week 2): Pt will read at the phrase level with ~ 75% accuracy and Mod A cues.  SLP Short Term Goal 4 (Week 2): Pt will self-monitor and self-correct verbal errors in 7 out of 10 opportunities with Mod A cues.  SLP Short Term Goal 5 (Week 2): Pt will demonstrate selective attention to task in mildly distracting environment for ~ 15 minutes with supervision  cues.  SLP Short Term Goal 6 (Week 2): Pt will complete basic familiar tasks with supervision A cues.   Skilled Therapeutic Interventions: Skilled treatment session focused on communication goals. SLP facilitated session by providing extra time and overall Mod A verbal and phonemic cues for word-finding and use of function words for patient to verbally describe actions within pictures at the phrase level. Patient with intermittent phonemic paraphasias and decreased intelligibility due to low vocal intensity in which patient replied he was tired. Patient left upright in wheelchair with alarm on and all needs within reach. Continue with current plan of care.      Pain No/Denies Pain   Therapy/Group: Individual Therapy  Loreto Loescher 03/23/2018, 2:20 PM

## 2018-03-24 ENCOUNTER — Inpatient Hospital Stay (HOSPITAL_COMMUNITY): Payer: Medicare Other | Admitting: Speech Pathology

## 2018-03-24 ENCOUNTER — Inpatient Hospital Stay (HOSPITAL_COMMUNITY): Payer: Medicare Other | Admitting: Occupational Therapy

## 2018-03-24 ENCOUNTER — Inpatient Hospital Stay (HOSPITAL_COMMUNITY): Payer: Medicare Other | Admitting: Physical Therapy

## 2018-03-24 LAB — GLUCOSE, CAPILLARY
GLUCOSE-CAPILLARY: 168 mg/dL — AB (ref 70–99)
Glucose-Capillary: 184 mg/dL — ABNORMAL HIGH (ref 70–99)
Glucose-Capillary: 159 mg/dL — ABNORMAL HIGH (ref 70–99)
Glucose-Capillary: 124 mg/dL — ABNORMAL HIGH (ref 70–99)

## 2018-03-24 MED ORDER — DEXAMETHASONE 2 MG PO TABS
1.0000 mg | ORAL_TABLET | Freq: Two times a day (BID) | ORAL | Status: DC
  Administered 2018-03-24 – 2018-03-29 (×10): 1 mg via ORAL
  Filled 2018-03-24 (×10): qty 1

## 2018-03-24 NOTE — Progress Notes (Signed)
Physical Therapy Session Note  Patient Details  Name: Taj Nevins MRN: [id number] Date of Birth: 1940/04/14  Today's Date: 03/24/2018 PT Individual Time: 0930-0956 PT Individual Time Calculation (min): 26 min   Short Term Goals: Week 2:  PT Short Term Goal 1 (Week 2): pt will perform gait x 50' with min A in controlled environment PT Short Term Goal 2 (Week 2): pt will perform functional transfers consistently with min A  Skilled Therapeutic Interventions/Progress Updates:   pt in recliner, requests shower, pt informed that OT would perform shower at next session, pt asks to stay in room since he hasn't showered.  Pt performs standing balance and coordination exercises with toe taps all directions, mini squats without UE support and wt shifts all directions. Pt improving strength and balance and requires min A for standing balance.  Pt much more conversational and able to convey ideas with min cuing.  Pt left in recliner with alarm set, needs at hand.   Therapy Documentation Precautions:  Precautions Precautions: Fall Precaution Comments: expressive aphasia, L hemiplgia, impulsive Restrictions Weight Bearing Restrictions: No Pain:  no c/o pain   Therapy/Group: Individual Therapy  Jourdyn Hasler 03/24/2018, 9:57 AM

## 2018-03-24 NOTE — Plan of Care (Signed)
  Problem: Consults Goal: RH STROKE PATIENT EDUCATION Description See Patient Education module for education specifics  Outcome: Progressing Goal: Nutrition Consult-if indicated Outcome: Progressing   Problem: RH BOWEL ELIMINATION Goal: RH STG MANAGE BOWEL WITH ASSISTANCE Description STG Manage Bowel with min-mod Assistance.  Outcome: Progressing Goal: RH STG MANAGE BOWEL W/MEDICATION W/ASSISTANCE Description STG Manage Bowel with Medication with min-mod Assistance.  Outcome: Progressing   Problem: RH BLADDER ELIMINATION Goal: RH STG MANAGE BLADDER WITH ASSISTANCE Description STG Manage Bladder With min-mod  Assistance  Outcome: Progressing Goal: RH STG MANAGE BLADDER WITH MEDICATION WITH ASSISTANCE Description STG Manage Bladder With Medication With min-mod Assistance.  Outcome: Progressing Goal: RH STG MANAGE BLADDER WITH EQUIPMENT WITH ASSISTANCE Description STG Manage Bladder With Equipment With min-mod  Assistance  Outcome: Progressing   Problem: RH SKIN INTEGRITY Goal: RH STG SKIN FREE OF INFECTION/BREAKDOWN Outcome: Progressing Goal: RH STG MAINTAIN SKIN INTEGRITY WITH ASSISTANCE Description STG Maintain Skin Integrity With min Assistance.  Outcome: Progressing   Problem: RH SAFETY Goal: RH STG ADHERE TO SAFETY PRECAUTIONS W/ASSISTANCE/DEVICE Description STG Adhere to Safety Precautions With min-mod Assistance/Device.  Outcome: Progressing   Problem: RH COGNITION-NURSING Goal: RH STG USES MEMORY AIDS/STRATEGIES W/ASSIST TO PROBLEM SOLVE Description STG Uses Memory Aids/Strategies With min Assistance to Problem Solve.  Outcome: Progressing   Problem: RH PAIN MANAGEMENT Goal: RH STG PAIN MANAGED AT OR BELOW PT'S PAIN GOAL Outcome: Progressing   Problem: RH KNOWLEDGE DEFICIT Goal: RH STG INCREASE KNOWLEDGE OF DIABETES Outcome: Progressing Goal: RH STG INCREASE KNOWLEDGE OF DYSPHAGIA/FLUID INTAKE Outcome: Progressing Goal: RH STG INCREASE KNOWLEGDE OF  HYPERLIPIDEMIA Outcome: Progressing Goal: RH STG INCREASE KNOWLEDGE OF STROKE PROPHYLAXIS Outcome: Progressing

## 2018-03-24 NOTE — Progress Notes (Signed)
Speech Language Pathology Daily Session Note  Patient Details  Name: Bryan [name] MRN: [id number] Date of Birth: 1940/11/24  Today's Date: 03/24/2018 SLP Individual Time: 5929-2446 SLP Individual Time Calculation (min): 45 min  Short Term Goals: Week 2: SLP Short Term Goal 1 (Week 2): Pt will name familiar objects with ~ 90% accuracy and Min A cues.  SLP Short Term Goal 2 (Week 2): Pt will express wants and needs using multimodal means with Min A cues in 7 out of 10 opportunities.  SLP Short Term Goal 3 (Week 2): Pt will read at the phrase level with ~ 75% accuracy and Mod A cues.  SLP Short Term Goal 4 (Week 2): Pt will self-monitor and self-correct verbal errors in 7 out of 10 opportunities with Mod A cues.  SLP Short Term Goal 5 (Week 2): Pt will demonstrate selective attention to task in mildly distracting environment for ~ 15 minutes with supervision  cues.  SLP Short Term Goal 6 (Week 2): Pt will complete basic familiar tasks with supervision A cues.   Skilled Therapeutic Interventions: Skilled treatment session focused on cognitive-linguistic goals. SLP facilitated session by providing extra time and Mod A verbal cues for word-finding at the phrase level during an informal conversation that focused on recall of events from yesterday (dog came to visit). SLP also facilitated session by providing Mod A verbal cues for use of function words at the phrase level during a verbal description task. Patient left upright in bed with alarm on and all needs within reach. Continue with current plan of care.      Pain No/Denies Pain   Therapy/Group: Individual Therapy  Bryan [name] 03/24/2018, 3:12 PM

## 2018-03-24 NOTE — Progress Notes (Signed)
Occupational Therapy Session Note  Patient Details  Name: Bryan [name] MRN: [id number] Date of Birth: 19-Sep-1940  Today's Date: 03/24/2018 OT Individual Time: 1000-1100 OT Individual Time Calculation (min): 60 min    Short Term Goals: Week 2:  OT Short Term Goal 1 (Week 2): Pt will use L UE for 50% of bathing tasks using velcro wash cloth OT Short Term Goal 2 (Week 2): Pt will maintain standing balance within BADL tasks with no more than min A OT Short Term Goal 3 (Week 2): Pt will complete toilet transfer with consistent min A  Skilled Therapeutic Interventions/Progress Updates:    Pt greeted seated in wc and agreeable to OT treatment session. Pt ambulated to bathroom with RW and min A overall with intermittent mod A 2/2 4 lateral LOB to the L. Pt needed assistance to don L wash mit, then was able to use L UE for 50% of bathing tasks with min verbal cues. L UE NMR within bathing tasks with weight bearing while washing. Stand-pivot out of shower with CGA. Grooming tasks completed seated in wc at the sink including shaving, hair brushing, and toothbrushing. Verbal cues to recall modifications to use L hand as a stabilizer. Dressing completed with verbal cues for hemi dressing techniques and overall min A to thread L pant leg and L shirt sleeve. Pt with new shoes today that patient stated he was more used to. Shoe buttons applied and pt demonstrated ability to fasten laces with buttons. Pt left seated in wc at end of session with safety alarm belt on and needs met.   Therapy Documentation Precautions:  Precautions Precautions: Fall Precaution Comments: expressive aphasia, L hemiplgia, impulsive Restrictions Weight Bearing Restrictions: No Pain:  none/denies pain  Therapy/Group: Individual Therapy  Valma Cava 03/24/2018, 10:31 AM

## 2018-03-24 NOTE — Progress Notes (Signed)
Physical Therapy Session Note  Patient Details  Name: Shade Kaley MRN: [id number] Date of Birth: 03-04-1941  Today's Date: 03/24/2018 PT Individual Time: 5374-8270 PT Individual Time Calculation (min): 57 min   Short Term Goals: Week 2:  PT Short Term Goal 1 (Week 2): pt will perform gait x 50' with min A in controlled environment PT Short Term Goal 2 (Week 2): pt will perform functional transfers consistently with min A  Skilled Therapeutic Interventions/Progress Updates:    pt agreeable to participate in therapy, family present to observe.  Gait training with RW and Lt foot ace wrapped into DF 100' x 4 with min A, manual facilitation for wt shifts and tactile cues to increase Lt hip flexion to reduce toe drag.  Gait with obstacle negotiation with min A with mod cuing for attention to obstacles when in a distracting environment.  Side stepping during gait with min/mod A with RW.  nustep for UE/LE strength and endurance x 7 minutes level 4.  Discussed importance of continued PT and physical activity at home with pt and family, also discussed energy conservation and having a daily schedule.  Pt/family verbalize understanding. Pt left in chair with alarm set, family present.   Therapy Documentation Precautions:  Precautions Precautions: Fall Precaution Comments: expressive aphasia, L hemiplgia, impulsive Restrictions Weight Bearing Restrictions: No Pain:  no c/o pain   Therapy/Group: Individual Therapy  Tamy Accardo 03/24/2018, 2:15 PM

## 2018-03-24 NOTE — Progress Notes (Signed)
[place] PHYSICAL MEDICINE & REHABILITATION PROGRESS NOTE  Subjective/Complaints: In good spirits. Working with SLP  ROS: limited due to language/communication  Objective: Vital Signs: Blood pressure (!) 153/61, pulse 82, temperature 98.1 F (36.7 C), temperature source Oral, resp. rate 18, height 5\' 5"  (1.651 m), weight 77.7 kg, SpO2 97 %. No results found. No results for input(s): WBC, HGB, HCT, PLT in the last 72 hours. No results for input(s): NA, K, CL, CO2, GLUCOSE, BUN, CREATININE, CALCIUM in the last 72 hours.  Physical Exam: BP (!) 153/61 (BP Location: Right Arm)   Pulse 82   Temp 98.1 F (36.7 C) (Oral)   Resp 18   Ht 5\' 5"  (1.651 m)   Wt 77.7 kg   SpO2 97%   BMI 28.51 kg/m  Constitutional: No distress . Vital signs reviewed. HEENT: EOMI, oral membranes moist Neck: supple Cardiovascular: RRR without murmur. No JVD    Respiratory: CTA Bilaterally without wheezes or rales. Normal effort    GI: BS +, non-tender, non-distended  Musc: No edema or tenderness in extremities. Neurological: He is alert Improving word findings. Still non-fluent Motor: Left upper extremity: Shoulder abduction, elbow flexion/extension 3-4/5, handgrip   3/5- Left lower extremity: 3 to+/5 proximal distally  Skin: Skin is warm and dry.  Scalp wound CDI Psychiatric: smiling pleasant  Assessment/Plan: 1. 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  Physiatrist is providing close team supervision and 24 hour management of active medical problems listed below.  Physiatrist and rehab team continue to assess barriers to discharge/monitor patient progress toward functional and medical goals  Care Tool:  Bathing    Body parts bathed by patient: Chest, Abdomen, Front perineal area, Right upper leg, Left upper leg, Face, Left arm, Right lower leg, Left lower leg,  Right arm   Body parts bathed by helper: Buttocks     Bathing assist Assist Level: Minimal Assistance - Patient > 75%     Upper Body Dressing/Undressing Upper body dressing   What is the patient wearing?: Pull over shirt    Upper body assist Assist Level: Minimal Assistance - Patient > 75%    Lower Body Dressing/Undressing Lower body dressing      What is the patient wearing?: Underwear/pull up, Pants     Lower body assist Assist for lower body dressing: Contact Guard/Touching assist     Toileting Toileting    Toileting assist Assist for toileting: Minimal Assistance - Patient > 75%     Transfers Chair/bed transfer  Transfers assist     Chair/bed transfer assist level: Minimal Assistance - Patient > 75%     Locomotion Ambulation   Ambulation assist      Assist level: Minimal Assistance - Patient > 75% Assistive device: Other (comment)(L hand orthosis, toe cover ) Max distance: 158ft    Walk 10 feet activity   Assist     Assist level: Minimal Assistance - Patient > 75% Assistive device: Orthosis, Walker-rolling(L hand orthosis and toe cap )   Walk 50 feet activity   Assist    Assist level: Minimal Assistance - Patient > 75% Assistive device: Walker-rolling, Orthosis(L hand orthosis, toe cover )    Walk 150 feet activity   Assist    Assist level: Moderate Assistance - Patient - 50 - 74% Assistive device: Hand held assist    Walk 10 feet on uneven surface  activity   Assist  Walk 10 feet on uneven surfaces activity did not occur: Safety/medical concerns         Wheelchair     Assist Will patient use wheelchair at discharge?: Yes Type of Wheelchair: Manual    Wheelchair assist level: Minimal Assistance - Patient > 75% Max wheelchair distance: 159ft    Wheelchair 50 feet with 2 turns activity    Assist        Assist Level: Minimal Assistance - Patient > 75%   Wheelchair 150 feet activity     Assist      Assist Level: Supervision/Verbal cueing      Medical Problem List and Plan: 1.  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 Status post craniotomy 03/07/2018. Patient was to follow up with Dr.Vaslow and Dr. Tammi Klippel on discharge.   -Continue CIR therapies including PT, OT SLP  Decadron: decrease to 1mg  q12 2.  DVT Prophylaxis/Anticoagulation: SCDs. 3. Pain Management: Tylenol as needed 4. Mood:  Provide emotional support 5. Neuropsych: This patient is not capable of making decisions on his own behalf. 6. Skin/Wound Care:  Routine skin checks  -removed sutures   7. Fluids/Electrolytes/Nutrition:  Routine in and out's  8. Seizures.   Keppra increased to 1500 twice daily  Consider addition of Vimpat if persistent breakthrough seizures  Controlled at present 9. Hypertension.   Lisinopril increased to 15 mg on 12/24  bp's better with decreasing steroids.  10.  Steroid-induced hyperglycemia on prediabetes. Glucophage 1000 mg twice a day. Monitor closely while on tapering s  CBG's falling  Continue to cover with SSI   11. Hyperlipidemia. Lipitor 12.  Hyponatremia  Sodium 133 12/30     13.  Hypoalbuminemia  Supplement initiated on 12/23 14.  Acute blood loss anemia  Hemoglobin 10.9 on 12/23  Continue to monitor 15. Borderline K+  4.7 on 12/30     LOS: 13 days A FACE TO [place] 03/24/2018, 9:30 AM

## 2018-03-25 ENCOUNTER — Inpatient Hospital Stay (HOSPITAL_COMMUNITY): Payer: Medicare Other | Admitting: Physical Therapy

## 2018-03-25 ENCOUNTER — Inpatient Hospital Stay (HOSPITAL_COMMUNITY): Payer: Medicare Other | Admitting: Occupational Therapy

## 2018-03-25 ENCOUNTER — Inpatient Hospital Stay (HOSPITAL_COMMUNITY): Payer: Medicare Other | Admitting: Speech Pathology

## 2018-03-25 ENCOUNTER — Telehealth: Payer: Self-pay | Admitting: Radiation Oncology

## 2018-03-25 LAB — GLUCOSE, CAPILLARY
GLUCOSE-CAPILLARY: 115 mg/dL — AB (ref 70–99)
Glucose-Capillary: 172 mg/dL — ABNORMAL HIGH (ref 70–99)
Glucose-Capillary: 123 mg/dL — ABNORMAL HIGH (ref 70–99)
Glucose-Capillary: 223 mg/dL — ABNORMAL HIGH (ref 70–99)

## 2018-03-25 MED ORDER — LEVETIRACETAM 750 MG PO TABS
1500.0000 mg | ORAL_TABLET | Freq: Two times a day (BID) | ORAL | Status: DC
  Administered 2018-03-25 – 2018-03-29 (×8): 1500 mg via ORAL
  Filled 2018-03-25 (×9): qty 2

## 2018-03-25 NOTE — Progress Notes (Signed)
Speech Language Pathology Weekly Progress and Session Note  Patient Details  Name: Bryan [name] MRN: [id number] Date of Birth: 1940/08/06  Beginning of progress report period: March 18, 2018 End of progress report period: March 25, 2018  Today's Date: 03/25/2018 SLP Individual Time: 0900-0930 SLP Individual Time Calculation (min): 30 min  Short Term Goals: Week 2: SLP Short Term Goal 1 (Week 2): Pt will name familiar objects with ~ 90% accuracy and Min A cues.  SLP Short Term Goal 1 - Progress (Week 2): Met SLP Short Term Goal 2 (Week 2): Pt will express wants and needs using multimodal means with Min A cues in 7 out of 10 opportunities.  SLP Short Term Goal 2 - Progress (Week 2): Met SLP Short Term Goal 3 (Week 2): Pt will read at the phrase level with ~ 75% accuracy and Mod A cues.  SLP Short Term Goal 3 - Progress (Week 2): Met SLP Short Term Goal 4 (Week 2): Pt will self-monitor and self-correct verbal errors in 7 out of 10 opportunities with Mod A cues.  SLP Short Term Goal 4 - Progress (Week 2): Not met SLP Short Term Goal 5 (Week 2): Pt will demonstrate selective attention to task in mildly distracting environment for ~ 15 minutes with supervision  cues.  SLP Short Term Goal 5 - Progress (Week 2): Met SLP Short Term Goal 6 (Week 2): Pt will complete basic familiar tasks with supervision A cues.  SLP Short Term Goal 6 - Progress (Week 2): Not met    New Short Term Goals: Week 3: SLP Short Term Goal 1 (Week 3): Pt will self-monitor and self-correct verbal errors in 7 out of 10 opportunities with Mod A verbal cues.  SLP Short Term Goal 2 (Week 3): Pt will read at the sentence level with ~ 75% accuracy and Mod A verbal cues to self-monitor and correct errors.  SLP Short Term Goal 3 (Week 3): Patient will write at the phrase level with 75% accuracy and Mod A verbal and visual cues to self-monitor and correct errors.  SLP Short Term Goal 4 (Week 3): Patient will express basic  wants/needs at the phrase level with approrpiate syntax with Mod A verbal cues and 75% accuracy.  SLP Short Term Goal 5 (Week 3): Pt will complete basic familiar tasks with supervision A verbal cues.   Weekly Progress Updates: Patient has made excellent gains and has met 4 of 6 STGs this reporting period. Currently, patient demonstrates increased word-finding with naming functional items and demonstrates increased ability to verbally express his wants/needs at the phrase level. Patient also demonstrates imprvoed ability to read aloud at the phrase level but continues to require overall Mod-Max A verbal cues to self-monitor and correct verbal errors. Patient also demonstrates increased attention to tasks but requires overall Min A verbal cues for functional problem solving. Patient and family education is ongoing. Patient would benefit from continued skilled SLP intervention to maximize his cognitive-linguistic functioning prior to discharge.      Intensity: Minumum of 1-2 x/day, 30 to 90 minutes Frequency: 3 to 5 out of 7 days Duration/Length of Stay: 1/15 Treatment/Interventions: Speech/Language facilitation;Patient/family education;Dysphagia/aspiration precaution training;Cognitive remediation/compensation;Environmental controls;Internal/external aids;Functional tasks;Cueing hierarchy;Therapeutic Activities   Daily Session  Skilled Therapeutic Interventions: Skilled treatment session focused on language goals. SLP facilitated session by providing Min-Mod A verbal cues for patient to self-monitor and correct errors while reading aloud at the phrase level. Patient able to demonstrate 90% accuracy with word-finding during phrase closure task.  SLP also facilitated session by providing Mod-Max A verbal cues for patient to self-monitor and correct errors during written expression at the word level and Min-Mod A verbal cues for word-finding during generative naming tasks. Patient left upright in wheelchiar  with RN present. Continue with current plan of care.      Pain No/Denies Pain   Therapy/Group: Individual Therapy  Carles Florea 03/25/2018, 3:38 PM

## 2018-03-25 NOTE — Progress Notes (Signed)
Occupational Therapy Session Note  Patient Details  Name: Bryan [name] MRN: [id number] Date of Birth: September 07, 1940  Today's Date: 03/25/2018 OT Individual Time: 1000-1100 OT Individual Time Calculation (min): 60 min   Short Term Goals: Week 2:  OT Short Term Goal 1 (Week 2): Pt will use L UE for 50% of bathing tasks using velcro wash cloth OT Short Term Goal 2 (Week 2): Pt will maintain standing balance within BADL tasks with no more than min A OT Short Term Goal 3 (Week 2): Pt will complete toilet transfer with consistent min A  Skilled Therapeutic Interventions/Progress Updates:    Pt greeted seated in wc and agreeable to OT treatment session. Pt declined bathing/dressing and was already dressed for the day. Pt completed 10 mins on [employer] arm bike. 2 in intervals using  BUEs, then only L UE. Utilized Ace wrap to maintain L hand on handle. Pt needed verbal cues for safe technique with sit<>stand as pt reached forward and tried to pull up from table. Facilitated weight bearing through L UE for neuro re-ed using soft tan thera-putty. Progressed to pt rolling putty into a log. Pt returned to sitting 2/2 fatigue and worked on Equ[employer] trader. Pt with improved activation of pinch with repetition. Focus on wrist extension with pt able to bring wrist to 20-30 degrees, OT provided active assist with joint input to bring wrist into full extension. 4 sets of 25. Pt reported need for bathroom and completed stand-pivot to commode with min A. Pt voided bladder and needed min/mod A for dynamic balance w/o UE support while reaching to try to pull up pants. Pt left seated in wc in preparation for next therapy session.   Therapy Documentation Precautions:  Precautions Precautions: Fall Precaution Comments: expressive aphasia, L hemiplgia, impulsive Restrictions Weight Bearing Restrictions: No Pain: Pain Assessment Pain Score: 0-No pain  Therapy/Group: Individual Therapy  Valma Cava 03/25/2018, 10:17  AM

## 2018-03-25 NOTE — Progress Notes (Addendum)
Physical Therapy Session Note  Patient Details  Name: Bryan [name] MRN: [id number] Date of Birth: 20-May-1940  Today's Date: 03/25/2018 PT Individual Time: 1100-1145 and 1430-1454 PT Individual Time Calculation (min): 45 min and 24 min   Short Term Goals: Week 3:  PT Short Term Goal 1 (Week 3): = LTG  Skilled Therapeutic Interventions/Progress Updates:    gait with RW with min A with Lt ankle ace wrapped for DF, manual facilitation for wt shifts, mod cues for attention to Lt LE due to impulsivity.  Stair negotiation with 1 handrail 4 steps x 2 with mod cuing for safety and mod A.  Standing balance training with focus on Lt LE strength in stance phase with tap ups to 4'' step with Rt LE forward and sideways with min/mod manual facilitation.  Squat and reach task with min/mod A.  Sit <> stand with adductor squeeze, LAQ with adductor squeeze.  Alternating tap ups for coordination with mod cuing.  Pt fatigued at end of session, left in room with needs at hand, alarm set.  Session 2: Pt with no c/o pain.  nustep x 7 min for UE/LE strength and endurance, level 4. Sit <> stand and standing balance with min A with min A for lower body undressing and min A for donning gown. Pt able to doff shoes with supervision.  Sit to supine with supervision, increased time. Pt left in bed with needs at hand, alarm set.  Therapy Documentation Precautions:  Precautions Precautions: Fall Precaution Comments: expressive aphasia, L hemiplgia, impulsive Restrictions Weight Bearing Restrictions: No  Pain: Pain Assessment Pain Scale: 0-10 Pain Score: 0-No pain   Therapy/Group: Individual Therapy  DONAWERTH,KAREN 03/25/2018, 12:38 PM

## 2018-03-25 NOTE — Plan of Care (Signed)
  Problem: Consults Goal: RH STROKE PATIENT EDUCATION Description See Patient Education module for education specifics  Outcome: Progressing Goal: Nutrition Consult-if indicated Outcome: Progressing   Problem: RH BOWEL ELIMINATION Goal: RH STG MANAGE BOWEL WITH ASSISTANCE Description STG Manage Bowel with min-mod Assistance.  Outcome: Progressing Goal: RH STG MANAGE BOWEL W/MEDICATION W/ASSISTANCE Description STG Manage Bowel with Medication with min-mod Assistance.  Outcome: Progressing   Problem: RH BLADDER ELIMINATION Goal: RH STG MANAGE BLADDER WITH ASSISTANCE Description STG Manage Bladder With min-mod  Assistance  Outcome: Progressing Goal: RH STG MANAGE BLADDER WITH MEDICATION WITH ASSISTANCE Description STG Manage Bladder With Medication With min-mod Assistance.  Outcome: Progressing Goal: RH STG MANAGE BLADDER WITH EQUIPMENT WITH ASSISTANCE Description STG Manage Bladder With Equipment With min-mod  Assistance  Outcome: Progressing   Problem: RH SKIN INTEGRITY Goal: RH STG SKIN FREE OF INFECTION/BREAKDOWN Outcome: Progressing Goal: RH STG MAINTAIN SKIN INTEGRITY WITH ASSISTANCE Description STG Maintain Skin Integrity With min Assistance.  Outcome: Progressing   Problem: RH SAFETY Goal: RH STG ADHERE TO SAFETY PRECAUTIONS W/ASSISTANCE/DEVICE Description STG Adhere to Safety Precautions With min-mod Assistance/Device.  Outcome: Progressing   Problem: RH COGNITION-NURSING Goal: RH STG USES MEMORY AIDS/STRATEGIES W/ASSIST TO PROBLEM SOLVE Description STG Uses Memory Aids/Strategies With min Assistance to Problem Solve.  Outcome: Progressing   Problem: RH PAIN MANAGEMENT Goal: RH STG PAIN MANAGED AT OR BELOW PT'S PAIN GOAL Outcome: Progressing   Problem: RH KNOWLEDGE DEFICIT Goal: RH STG INCREASE KNOWLEDGE OF DIABETES Outcome: Progressing Goal: RH STG INCREASE KNOWLEDGE OF DYSPHAGIA/FLUID INTAKE Outcome: Progressing Goal: RH STG INCREASE KNOWLEGDE OF  HYPERLIPIDEMIA Outcome: Progressing Goal: RH STG INCREASE KNOWLEDGE OF STROKE PROPHYLAXIS Outcome: Progressing

## 2018-03-25 NOTE — Progress Notes (Signed)
Occupational Therapy Session Note  Patient Details  Name: Bryan [name] MRN: [id number] Date of Birth: [birth year]  Today's Date: 03/25/2018 OT Individual Time: 0830-0900 OT Individual Time Calculation (min): 30 min    Short Term Goals: Week 1:  OT Short Term Goal 1 (Week 1): Pt will maintain sitting balance on toilet or BSC with supervision assist while voiding  OT Short Term Goal 1 - Progress (Week 1): Met OT Short Term Goal 2 (Week 1): Pt will complete sit<stand during LB self care with Min A  OT Short Term Goal 2 - Progress (Week 1): Met OT Short Term Goal 3 (Week 1): Pt will initiate 1 limb protection strategy during B/D for 1 OT session  OT Short Term Goal 3 - Progress (Week 1): Met Week 2:  OT Short Term Goal 1 (Week 2): Pt will use L UE for 50% of bathing tasks using velcro wash cloth OT Short Term Goal 2 (Week 2): Pt will maintain standing balance within BADL tasks with no more than min A OT Short Term Goal 3 (Week 2): Pt will complete toilet transfer with consistent min A  Skilled Therapeutic Interventions/Progress Updates:    Pt seen this session for ADL training with a focus on use of LUE and balance.  Pt received in bed and practiced supine to sit from flat bed with no rails with guiding A to fully roll onto R side and push up to sitting.  Pt transferred to w/c and then to bathroom with min A stand pivot and completed toileting with min A to fully pull pants over L hip.   He opted not to bathe and worked on LB dressing from toilet with hand over hand A to use L hand to grasp pants to pull over legs. He needed some A over L foot and L hip.  To don shirt, he needed guiding A to start LUE in long shirt sleeve and then could don over head then R arm with verbal cues.  He completed oral care at sink with L hand on toothpaste with hand over hand A.  Good attempts to use L hand but limited grasp caused him to drop items or loose his grip on his clothing.  Pt resting in w/c with chair belt  alarm on and all needs met.    Therapy Documentation Precautions:  Precautions Precautions: Fall Precaution Comments: expressive aphasia, L hemiplgia, impulsive Restrictions Weight Bearing Restrictions: No   Pain: Pain Assessment Pain Score: 0-No pain    Therapy/Group: Individual Therapy  [place] 03/25/2018, 9:52 AM

## 2018-03-25 NOTE — Patient Care Conference (Signed)
Inpatient RehabilitationTeam Conference and Plan of Care Update Date: 03/22/2018   Time: 2:30 PM    Patient Name: Bryan [name]      Medical Record Number: [id number]  Date of Birth: October 07, 1940 Sex: Male         Room/Bed: 4W13C/4W13C-01 Payor Info: Payor: MEDICARE / Plan: MEDICARE PART A AND B / Product Type: *No Product type* /    Admitting Diagnosis: Brain Tumor resection  Admit Date/Time:  03/11/2018 11:41 AM Admission Comments: No comment available   Primary Diagnosis:  <principal problem not specified> Principal Problem: <principal problem not specified>  Patient Active Problem List   Diagnosis Date Noted  . Acute blood loss anemia   . Seizures ([place])   . Hypoalbuminemia due to protein-calorie malnutrition ([place])   . Hyponatremia   . Steroid-induced hyperglycemia   . Brain tumor ([place]) 03/11/2018  . Postoperative pain   . Seizure prophylaxis   . Prediabetes   . Dyslipidemia   . Brain mass 02/16/2018  . Diabetes mellitus without complication ([place])   . Hypercholesterolemia   . Hypertension     Expected Discharge Date: Expected Discharge Date: 04/06/18  Team Members Present: Physician leading conference: Dr. Alger Simons Social Worker Present: Lennart Pall, LCSW Nurse Present: Rayetta Pigg, RN PT Present: Canary Brim, PT OT Present: Willeen Cass, OT SLP Present: Weston Anna, SLP PPS Coordinator present : Gunnar Fusi     Current Status/Progress Goal Weekly Team Focus  Medical   Patient with ongoing word finding deficits and left-sided weakness.  Pathology positive for high-grade glioma.  Patient has local follow-up with radiation and neuro-oncology     Cognition, seizure prevention, nutrition, diabetes control and hypertension   Bowel/Bladder   continent of B/B LBM 12/30  regular bowel pattern remain continent of B/B  assist with toileting needs prn   Swallow/Nutrition/ Hydration             ADL's     Min A transfers, min/Mod A BADLs   supervision/CGA  goals   L NMR, transfers, modified bathing/ dressing, activity tolerance  Mobility     min/mod transfers and gait with RW x 90', 4 steps mod assist   supervision overall, min A 1 step to enter house   NMR, balance, gait   Communication   Overall Mod-Max A   Min A for verbal expression  structured verbal tasks to focus on function words    Safety/Cognition/ Behavioral Observations  Min-Mod A   Min A  attention, problem solving    Pain   c/o headache tylenol prn  free of pain   assess pain qshift and prn   Skin   incision right crani OTA   no s/sx of infection no new skin issues  assess skin qshift and prn    Rehab Goals Patient on target to meet rehab goals: Yes *See Care Plan and progress notes for long and short-term goals.     Barriers to Discharge  Current Status/Progress Possible Resolutions Date Resolved   Physician    Medical stability        Ongoing management of medical considerations above.  Establish follow-up plan with oncology      Nursing                  PT                    OT  SLP                SW                Discharge Planning/Teaching Needs:  Plan to d/c home with wife who can provide 24/7 assistance.  Local daughter also supportive.  Teaching has been ongoing.   Team Discussion:  Mostly cont b/b;  Sutures out.  Min assist with ADLs;  Improved hand strength.  Aphasia improving overall.  More engaged and alert.  Planning to upgrade goals to supervision.  Revisions to Treatment Plan:  Plan to upgrade many goals.    Continued Need for Acute Rehabilitation Level of Care: The patient requires daily medical management by a physician with specialized training in physical medicine and rehabilitation for the following conditions: Daily direction of a multidisciplinary physical rehabilitation program to ensure safe treatment while eliciting the highest outcome that is of practical value to the patient.: Yes Daily medical management of  patient stability for increased activity during participation in an intensive rehabilitation regime.: Yes Daily analysis of laboratory values and/or radiology reports with any subsequent need for medication adjustment of medical intervention for : Post surgical problems;Neurological problems;Other   I attest that I was present, lead the team conference, and concur with the assessment and plan of the team.   Jusiah Aguayo 03/25/2018, 4:50 PM

## 2018-03-25 NOTE — Telephone Encounter (Signed)
Received voicemail message from patient's daughter, Bryan [name], requesting a return call. Phoned [name] back and she expressed concerns about transportation reference her father's consultation on Tuesday. Reassured her [facility] would be arranged for transport from and back to [facility]. Also, expressed Dr. Renda Rolls intent to meet with patient as well on Tuesday. She verbalized understanding, appreciation for the coordination of care, and call back.

## 2018-03-25 NOTE — Progress Notes (Signed)
Physical Therapy Weekly Progress Note  Patient Details  Name: Bryan [name] MRN: [id number] Date of Birth: January 03, 1941  Beginning of progress report period: March 18, 2018 End of progress report period: March 25, 2018   Patient has met 2 of 2 short term goals.  Pt able to perform gait with RW and supervision in controlled environment, mod A for obstacle negotiation and sidestepping with RW.  Pt performs transfers with min A.  Patient continues to demonstrate the following deficits muscle weakness, abnormal tone, unbalanced muscle activation, decreased coordination and decreased motor planning and decreased standing balance, decreased postural control, hemiplegia and decreased balance strategies and therefore will continue to benefit from skilled PT intervention to increase functional independence with mobility.  Patient progressing toward long term goals..  Continue plan of care.  PT Short Term Goals Week 2:  PT Short Term Goal 1 (Week 2): pt will perform gait x 50' with min A in controlled environment PT Short Term Goal 1 - Progress (Week 2): Met PT Short Term Goal 2 (Week 2): pt will perform functional transfers consistently with min A PT Short Term Goal 2 - Progress (Week 2): Met Week 3:  PT Short Term Goal 1 (Week 3): = LTG  Skilled Therapeutic Interventions/Progress Updates:  Ambulation/gait training;Balance/vestibular training;Cognitive remediation/compensation;Community reintegration;Discharge planning;DME/adaptive equipment instruction;Functional electrical stimulation;Neuromuscular re-education;Functional mobility training;Pain management;Patient/family education;Stair training;Splinting/orthotics;Therapeutic Activities;Therapeutic Exercise;UE/LE Strength taining/ROM;UE/LE Coordination activities;Wheelchair propulsion/positioning     Lonie Newsham 03/25/2018, 8:10 AM

## 2018-03-25 NOTE — Progress Notes (Signed)
[place] PHYSICAL MEDICINE & REHABILITATION PROGRESS NOTE  Subjective/Complaints: Just woke up. Ready for breakfast. Denies pain.   ROS: limited due to language/communication    Objective: Vital Signs: Blood pressure 131/64, pulse 73, temperature 97.8 F (36.6 C), temperature source Oral, resp. rate 18, height 5\' 5"  (1.651 m), weight 77.7 kg, SpO2 97 %. No results found. No results for input(s): WBC, HGB, HCT, PLT in the last 72 hours. No results for input(s): NA, K, CL, CO2, GLUCOSE, BUN, CREATININE, CALCIUM in the last 72 hours.  Physical Exam: BP 131/64 (BP Location: Right Arm)   Pulse 73   Temp 97.8 F (36.6 C) (Oral)   Resp 18   Ht 5\' 5"  (1.651 m)   Wt 77.7 kg   SpO2 97%   BMI 28.51 kg/m  Constitutional: No distress . Vital signs reviewed. HEENT: EOMI, oral membranes moist Neck: supple Cardiovascular: RRR without murmur. No JVD    Respiratory: CTA Bilaterally without wheezes or rales. Normal effort    GI: BS +, non-tender, non-distended  Musc: No edema or tenderness in extremities. Neurological: He is alert Improving word findings. Still non-fluent Motor: Left upper extremity: Shoulder abduction, elbow flexion/extension 3-4/5, handgrip   3/5--stable to improved Left lower extremity: 3 to+/5 proximal distally  Skin: Skin is warm and dry.  Scalp wound CDI Psychiatric: in good spirits  Assessment/Plan: 1. 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  Physiatrist is providing close team supervision and 24 hour management of active medical problems listed below.  Physiatrist and rehab team continue to assess barriers to discharge/monitor patient progress toward functional and medical goals  Care Tool:  Bathing    Body parts bathed by patient: Chest, Abdomen, Front perineal area, Right upper leg, Left upper leg, Face, Left arm,  Right lower leg, Left lower leg, Right arm   Body parts bathed by helper: Buttocks     Bathing assist Assist Level: Minimal Assistance - Patient > 75%     Upper Body Dressing/Undressing Upper body dressing   What is the patient wearing?: Pull over shirt    Upper body assist Assist Level: Minimal Assistance - Patient > 75%    Lower Body Dressing/Undressing Lower body dressing      What is the patient wearing?: Underwear/pull up, Pants     Lower body assist Assist for lower body dressing: Minimal Assistance - Patient > 75%     Toileting Toileting    Toileting assist Assist for toileting: Minimal Assistance - Patient > 75%     Transfers Chair/bed transfer  Transfers assist     Chair/bed transfer assist level: Minimal Assistance - Patient > 75%     Locomotion Ambulation   Ambulation assist      Assist level: Minimal Assistance - Patient > 75% Assistive device: Walker-rolling Max distance: 100   Walk 10 feet activity   Assist     Assist level: Minimal Assistance - Patient > 75% Assistive device: Orthosis, Walker-rolling(L hand orthosis and toe cap )   Walk 50 feet activity   Assist    Assist level: Minimal Assistance - Patient > 75% Assistive device: Walker-rolling, Orthosis(L hand orthosis, toe cover )    Walk 150 feet activity   Assist    Assist level: Moderate Assistance - Patient - 50 - 74% Assistive device: Hand held assist    Walk 10 feet on uneven surface  activity  Assist Walk 10 feet on uneven surfaces activity did not occur: Safety/medical concerns         Wheelchair     Assist Will patient use wheelchair at discharge?: Yes Type of Wheelchair: Manual    Wheelchair assist level: Minimal Assistance - Patient > 75% Max wheelchair distance: 127ft    Wheelchair 50 feet with 2 turns activity    Assist        Assist Level: Minimal Assistance - Patient > 75%   Wheelchair 150 feet activity     Assist      Assist Level: Supervision/Verbal cueing      Medical Problem List and Plan: 1.  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 Status post craniotomy 03/07/2018. Patient was to follow up with Dr.Vaslow and Dr. Tammi Klippel on discharge.   -Continue CIR therapies including PT, OT SLP  Decadron: decreased to 1mg  q12 1/2--wean to off next week 2.  DVT Prophylaxis/Anticoagulation: SCDs. 3. Pain Management: Tylenol as needed 4. Mood:  Provide emotional support 5. Neuropsych: This patient is not capable of making decisions on his own behalf. 6. Skin/Wound Care:  Routine skin checks  -removed sutures   7. Fluids/Electrolytes/Nutrition:  Routine in and out's  8. Seizures.   Keppra increased to 1500 twice daily  Consider addition of Vimpat if persistent breakthrough seizures  Controlled at present 9. Hypertension.   Lisinopril increased to 15 mg on 12/24  bp's better with decreasing steroids.  10.  Steroid-induced hyperglycemia on prediabetes. Glucophage 1000 mg twice a day. Monitor closely while on tapering s  CBG's falling with wean of decadron  Continue to cover any increased cbg's with SSI   11. Hyperlipidemia. Lipitor 12.  Hyponatremia   Sodium 133 12/30--recheck monday     13.  Hypoalbuminemia  Supplement initiated on 12/23 14.  Acute blood loss anemia  Hemoglobin 10.9 on 12/23  Continue to monitor--recheck monday 15. Borderline K+  4.7 on 12/30     LOS: 14 days A FACE TO [place] 03/25/2018, 11:02 AM

## 2018-03-26 ENCOUNTER — Inpatient Hospital Stay (HOSPITAL_COMMUNITY): Payer: Medicare Other | Admitting: Physical Therapy

## 2018-03-26 ENCOUNTER — Inpatient Hospital Stay (HOSPITAL_COMMUNITY): Payer: Medicare Other

## 2018-03-26 ENCOUNTER — Inpatient Hospital Stay (HOSPITAL_COMMUNITY): Payer: Medicare Other | Admitting: Speech Pathology

## 2018-03-26 DIAGNOSIS — G8190 Hemiplegia, unspecified affecting unspecified side: Secondary | ICD-10-CM

## 2018-03-26 LAB — GLUCOSE, CAPILLARY
Glucose-Capillary: 218 mg/dL — ABNORMAL HIGH (ref 70–99)
Glucose-Capillary: 121 mg/dL — ABNORMAL HIGH (ref 70–99)
Glucose-Capillary: 146 mg/dL — ABNORMAL HIGH (ref 70–99)
Glucose-Capillary: 129 mg/dL — ABNORMAL HIGH (ref 70–99)

## 2018-03-26 NOTE — Progress Notes (Signed)
[place] PHYSICAL MEDICINE & REHABILITATION PROGRESS NOTE  Subjective/Complaints: Patient feels like he is getting a little bit stronger on the left side.  ROS: limited due to language/communication    Objective: Vital Signs: Blood pressure 112/88, pulse 71, temperature 97.7 F (36.5 C), temperature source Oral, resp. rate 18, height 5\' 5"  (1.651 m), weight 77.7 kg, SpO2 97 %. No results found. No results for input(s): WBC, HGB, HCT, PLT in the last 72 hours. No results for input(s): NA, K, CL, CO2, GLUCOSE, BUN, CREATININE, CALCIUM in the last 72 hours.  Physical Exam: BP 112/88 (BP Location: Right Arm)   Pulse 71   Temp 97.7 F (36.5 C) (Oral)   Resp 18   Ht 5\' 5"  (1.651 m)   Wt 77.7 kg   SpO2 97%   BMI 28.51 kg/m  Constitutional: No distress . Vital signs reviewed. HEENT: EOMI, oral membranes moist Neck: supple Cardiovascular: RRR without murmur. No JVD    Respiratory: CTA Bilaterally without wheezes or rales. Normal effort    GI: BS +, non-tender, non-distended  Musc: No edema or tenderness in extremities. Neurological: He is alert Improving word findings. Still non-fluent Motor: Left upper extremity: Shoulder abduction, elbow flexion/extension 3-4/5, handgrip   3/5--stable to improved Left lower extremity: 3 to+/5 proximal distally  Skin: Skin is warm and dry.  Scalp wound CDI Psychiatric: in good spirits  Assessment/Plan: 1. 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  Physiatrist is providing close team supervision and 24 hour management of active medical problems listed below.  Physiatrist and rehab team continue to assess barriers to discharge/monitor patient progress toward functional and medical goals  Care Tool:  Bathing    Body parts bathed by patient: Chest, Abdomen, Front perineal area, Right upper leg, Left upper  leg, Face, Left arm, Right lower leg, Left lower leg, Right arm   Body parts bathed by helper: Buttocks     Bathing assist Assist Level: Minimal Assistance - Patient > 75%     Upper Body Dressing/Undressing Upper body dressing   What is the patient wearing?: Pull over shirt    Upper body assist Assist Level: Supervision/Verbal cueing    Lower Body Dressing/Undressing Lower body dressing      What is the patient wearing?: Underwear/pull up, Pants     Lower body assist Assist for lower body dressing: Minimal Assistance - Patient > 75%     Toileting Toileting    Toileting assist Assist for toileting: Minimal Assistance - Patient > 75%     Transfers Chair/bed transfer  Transfers assist     Chair/bed transfer assist level: Minimal Assistance - Patient > 75%     Locomotion Ambulation   Ambulation assist      Assist level: Minimal Assistance - Patient > 75% Assistive device: Walker-rolling Max distance: 100   Walk 10 feet activity   Assist     Assist level: Minimal Assistance - Patient > 75% Assistive device: Orthosis, Walker-rolling(L hand orthosis and toe cap )   Walk 50 feet activity   Assist    Assist level: Minimal Assistance - Patient > 75% Assistive device: Walker-rolling, Orthosis(L hand orthosis, toe cover )    Walk 150 feet activity   Assist    Assist level: Moderate Assistance - Patient - 50 - 74% Assistive device: Hand held assist    Walk 10 feet on uneven surface  activity  Assist Walk 10 feet on uneven surfaces activity did not occur: Safety/medical concerns         Wheelchair     Assist Will patient use wheelchair at discharge?: Yes Type of Wheelchair: Manual    Wheelchair assist level: Minimal Assistance - Patient > 75% Max wheelchair distance: 121ft    Wheelchair 50 feet with 2 turns activity    Assist        Assist Level: Minimal Assistance - Patient > 75%   Wheelchair 150 feet activity      Assist     Assist Level: Supervision/Verbal cueing      Medical Problem List and Plan: 1.  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 Status post craniotomy 03/07/2018. Patient was to follow up with Dr.Vaslow and Dr. Tammi Klippel on discharge.   -Continue CIR therapies including PT, OT SLP  Decadron: decreased to 1mg  q12 1/2--wean to off next week 2.  DVT Prophylaxis/Anticoagulation: SCDs. 3. Pain Management: Tylenol as needed 4. Mood:  Provide emotional support 5. Neuropsych: This patient is not capable of making decisions on his own behalf. 6. Skin/Wound Care:  Routine skin checks  -removed sutures   7. Fluids/Electrolytes/Nutrition:  Routine in and out's  8. Seizures.   Keppra increased to 1500 twice daily  Consider addition of Vimpat if persistent breakthrough seizures  Controlled at present 9. Hypertension.   Lisinopril increased to 15 mg on 12/24  bp's better with decreasing steroids.  Vitals:   03/25/18 2001 03/26/18 0337  BP: 131/72 112/88  Pulse: 78 71  Resp: 18 18  Temp: 98.3 F (36.8 C) 97.7 F (36.5 C)  SpO2:    Well controlled 10.  Steroid-induced hyperglycemia on prediabetes. Glucophage 1000 mg twice a day. Monitor closely while on tapering s  CBG's falling with wean of decadron, will reduce dose 2.5 3 times daily starting in 1-2 days CBG (last 3)  Recent Labs    03/25/18 1701 03/25/18 2106 03/26/18 0641  GLUCAP 223* 115* 218*  Lability noted no change in treatment given up coming further taper 11. Hyperlipidemia. Lipitor 12.  Hyponatremia   Sodium 133 12/30--recheck monday     13.  Hypoalbuminemia  Supplement initiated on 12/23 14.  Acute blood loss anemia  Hemoglobin 10.9 on 12/23  Continue to monitor--recheck monday 15. Borderline K+  4.7 on 12/30     LOS: 15 days A FACE TO [place] E  03/26/2018, 12:37 PM

## 2018-03-26 NOTE — Progress Notes (Signed)
Physical Therapy Session Note  Patient Details  Name: Bryan [name] MRN: [id number] Date of Birth: 15-Dec-1940  Today's Date: 03/26/2018 PT Individual Time: 1550-1655 PT Individual Time Calculation (min): 65 min   Short Term Goals: Week 3:  PT Short Term Goal 1 (Week 3): = LTG  Skilled Therapeutic Interventions/Progress Updates:   Pt in w/c and agreeable to therapy, denies pain. Total assist w/c transport to therapy gym. Worked on Personnel officer w/ RW. Ambulated in controlled environment w/o LLE brace. Added foot-up brace w/ improved foot clearance and toe cap w/ decreased toe drag. Ambulated w/o LLE brace while negotiating cones to work on awareness of L side during mobility and simultaneously managing RW while weaving. CGA-min assist overall for all gait w/ manual assist for lateral weight shifting, tactile cues for posture, and verbal cues for gait pattern. Pt w/ increased fatigue after ambulating for majority of session, worked on LUE functional motor control w/ clothespins while at seated level. Emphasis on grasp control to improve LUE use on RW. Ambulated back to room, 150', w/ RW and min assist. Emphasis on obstacle avoidance and ambulating in busier community environment. Verbal cues throughout session for attention to task. Ended session in w/c and in care of wife, all needs met.   Therapy Documentation Precautions:  Precautions Precautions: Fall Precaution Comments: expressive aphasia, L hemiplgia, impulsive Restrictions Weight Bearing Restrictions: No Vital Signs: Therapy Vitals Temp: 98.6 F (37 C) Temp Source: Oral Pulse Rate: 80 Resp: 18 BP: 140/66 Patient Position (if appropriate): Lying Oxygen Therapy SpO2: 97 % O2 Device: Room Air  Therapy/Group: Individual Therapy  Jaxten Brosh K Aleta Manternach 03/26/2018, 5:03 PM

## 2018-03-26 NOTE — Plan of Care (Signed)
  Problem: Consults Goal: RH STROKE PATIENT EDUCATION Description See Patient Education module for education specifics  Outcome: Progressing Goal: Nutrition Consult-if indicated Outcome: Progressing   Problem: RH BOWEL ELIMINATION Goal: RH STG MANAGE BOWEL WITH ASSISTANCE Description STG Manage Bowel with min-mod Assistance.  Outcome: Progressing Goal: RH STG MANAGE BOWEL W/MEDICATION W/ASSISTANCE Description STG Manage Bowel with Medication with min-mod Assistance.  Outcome: Progressing   Problem: RH BLADDER ELIMINATION Goal: RH STG MANAGE BLADDER WITH ASSISTANCE Description STG Manage Bladder With min-mod  Assistance  Outcome: Progressing Goal: RH STG MANAGE BLADDER WITH MEDICATION WITH ASSISTANCE Description STG Manage Bladder With Medication With min-mod Assistance.  Outcome: Progressing Goal: RH STG MANAGE BLADDER WITH EQUIPMENT WITH ASSISTANCE Description STG Manage Bladder With Equipment With min-mod  Assistance  Outcome: Progressing   Problem: RH SKIN INTEGRITY Goal: RH STG SKIN FREE OF INFECTION/BREAKDOWN Outcome: Progressing Goal: RH STG MAINTAIN SKIN INTEGRITY WITH ASSISTANCE Description STG Maintain Skin Integrity With min Assistance.  Outcome: Progressing   Problem: RH SAFETY Goal: RH STG ADHERE TO SAFETY PRECAUTIONS W/ASSISTANCE/DEVICE Description STG Adhere to Safety Precautions With min-mod Assistance/Device.  Outcome: Progressing   Problem: RH COGNITION-NURSING Goal: RH STG USES MEMORY AIDS/STRATEGIES W/ASSIST TO PROBLEM SOLVE Description STG Uses Memory Aids/Strategies With min Assistance to Problem Solve.  Outcome: Progressing   Problem: RH PAIN MANAGEMENT Goal: RH STG PAIN MANAGED AT OR BELOW PT'S PAIN GOAL Outcome: Progressing   Problem: RH KNOWLEDGE DEFICIT Goal: RH STG INCREASE KNOWLEDGE OF DIABETES Outcome: Progressing Goal: RH STG INCREASE KNOWLEDGE OF DYSPHAGIA/FLUID INTAKE Outcome: Progressing Goal: RH STG INCREASE KNOWLEGDE OF  HYPERLIPIDEMIA Outcome: Progressing Goal: RH STG INCREASE KNOWLEDGE OF STROKE PROPHYLAXIS Outcome: Progressing

## 2018-03-26 NOTE — Progress Notes (Signed)
Occupational Therapy Session Note  Patient Details  Name: Slayter Moorhouse MRN: [id number] Date of Birth: 11/08/1940  Today's Date: 03/26/2018 OT Individual Time: 0820-0930 OT Individual Time Calculation (min): 70 min    Short Term Goals: Week 3:     Skilled Therapeutic Interventions/Progress Updates:    1;1. Pt with no pain reported during session. Pt completes stand pivot transfers w/c<>TTB in bathroom with min A. Pt bathes sit to stand with CGA  For peri bathing and wash mit on LUE with 50% of body washed with LUE. Pt completes dressing with supervision/VC for hemi techniques for UB dressing and min A for advancing pants past hips. Pt dons socks and shoes with set up. tp completes box and blocks assessment RUE 37, LUE 6 blocks. Pt flips over game pieces with LUE and R hand retrained for forced use of LUE and VC for pinch/decreased shoulder hike. Exited sesionw ith pt seate din w/c,call light in reach and all needs met  Therapy Documentation Precautions:  Precautions Precautions: Fall Precaution Comments: expressive aphasia, L hemiplgia, impulsive Restrictions Weight Bearing Restrictions: No General:   Vital Signs:   Pain:   ADL: ADL Eating: Not assessed Grooming: Moderate assistance Where Assessed-Grooming: Wheelchair, Sitting at sink Upper Body Bathing: Moderate assistance Where Assessed-Upper Body Bathing: Wheelchair, Sitting at sink Lower Body Bathing: Maximal assistance Where Assessed-Lower Body Bathing: Wheelchair, Sitting at sink, Standing at sink Upper Body Dressing: Moderate assistance Where Assessed-Upper Body Dressing: Sitting at sink, Wheelchair Lower Body Dressing: Maximal assistance Where Assessed-Lower Body Dressing: Standing at sink, Sitting at sink, Wheelchair Toileting: Moderate assistance Where Assessed-Toileting: Glass blower/designer: Moderate assistance Toilet Transfer Method: Squat pivot Toilet Transfer Equipment: Energy manager:  Not assessed Vision   Perception    Praxis   Exercises:   Other Treatments:     Therapy/Group: Individual Therapy  Tonny Branch 03/26/2018, 9:32 AM

## 2018-03-26 NOTE — Progress Notes (Signed)
Speech Language Pathology Daily Session Note  Patient Details  Name: Bryan [name] MRN: [id number] Date of Birth: 1940-12-26  Today's Date: 03/26/2018 SLP Individual Time: 0569-7948 SLP Individual Time Calculation (min): 45 min  Short Term Goals: Week 3: SLP Short Term Goal 1 (Week 3): Pt will self-monitor and self-correct verbal errors in 7 out of 10 opportunities with Mod A verbal cues.  SLP Short Term Goal 2 (Week 3): Pt will read at the sentence level with ~ 75% accuracy and Mod A verbal cues to self-monitor and correct errors.  SLP Short Term Goal 3 (Week 3): Patient will write at the phrase level with 75% accuracy and Mod A verbal and visual cues to self-monitor and correct errors.  SLP Short Term Goal 4 (Week 3): Patient will express basic wants/needs at the phrase level with approrpiate syntax with Mod A verbal cues and 75% accuracy.  SLP Short Term Goal 5 (Week 3): Pt will complete basic familiar tasks with supervision A verbal cues.   Skilled Therapeutic Interventions:    Skilled treatment session focused on communication goals. SLP faciltiated session by providing Mod A verbal cues to write at the phrase level. SLP able to fade to Min A cues if pt verbally stated phrase prior to writing it.  When pt became stuck on a word, providing the initial phonetic sound was helpful in producing written word. When composing simple phrase, pt required Max A verbal cues to self-monitor and self-correct. Pt able to read at the sentence level with Min A to achieve ~ 80% accuracy.  Pt returned to room, left upright in wheelchair, lap alarm on and all needs within reach. Continue current plan of care.    Pain Pain Assessment Pain Scale: 0-10 Pain Score: 0-No pain  Therapy/Group: Individual Therapy  Duilio Heritage 03/26/2018, 11:28 AM

## 2018-03-27 ENCOUNTER — Inpatient Hospital Stay (HOSPITAL_COMMUNITY): Payer: Medicare Other | Admitting: Occupational Therapy

## 2018-03-27 LAB — GLUCOSE, CAPILLARY
Glucose-Capillary: 145 mg/dL — ABNORMAL HIGH (ref 70–99)
Glucose-Capillary: 141 mg/dL — ABNORMAL HIGH (ref 70–99)
Glucose-Capillary: 174 mg/dL — ABNORMAL HIGH (ref 70–99)
Glucose-Capillary: 108 mg/dL — ABNORMAL HIGH (ref 70–99)

## 2018-03-27 NOTE — Plan of Care (Signed)
  Problem: Consults Goal: RH STROKE PATIENT EDUCATION Description See Patient Education module for education specifics  Outcome: Progressing Goal: Nutrition Consult-if indicated Outcome: Progressing   Problem: RH BOWEL ELIMINATION Goal: RH STG MANAGE BOWEL WITH ASSISTANCE Description STG Manage Bowel with min-mod Assistance.  Outcome: Progressing Goal: RH STG MANAGE BOWEL W/MEDICATION W/ASSISTANCE Description STG Manage Bowel with Medication with min-mod Assistance.  Outcome: Progressing   Problem: RH BLADDER ELIMINATION Goal: RH STG MANAGE BLADDER WITH ASSISTANCE Description STG Manage Bladder With min-mod  Assistance  Outcome: Progressing Goal: RH STG MANAGE BLADDER WITH MEDICATION WITH ASSISTANCE Description STG Manage Bladder With Medication With min-mod Assistance.  Outcome: Progressing Goal: RH STG MANAGE BLADDER WITH EQUIPMENT WITH ASSISTANCE Description STG Manage Bladder With Equipment With min-mod  Assistance  Outcome: Progressing   Problem: RH SKIN INTEGRITY Goal: RH STG SKIN FREE OF INFECTION/BREAKDOWN Outcome: Progressing Goal: RH STG MAINTAIN SKIN INTEGRITY WITH ASSISTANCE Description STG Maintain Skin Integrity With min Assistance.  Outcome: Progressing   Problem: RH SAFETY Goal: RH STG ADHERE TO SAFETY PRECAUTIONS W/ASSISTANCE/DEVICE Description STG Adhere to Safety Precautions With min-mod Assistance/Device.  Outcome: Progressing   Problem: RH COGNITION-NURSING Goal: RH STG USES MEMORY AIDS/STRATEGIES W/ASSIST TO PROBLEM SOLVE Description STG Uses Memory Aids/Strategies With min Assistance to Problem Solve.  Outcome: Progressing   Problem: RH PAIN MANAGEMENT Goal: RH STG PAIN MANAGED AT OR BELOW PT'S PAIN GOAL Outcome: Progressing   Problem: RH KNOWLEDGE DEFICIT Goal: RH STG INCREASE KNOWLEDGE OF DIABETES Outcome: Progressing Goal: RH STG INCREASE KNOWLEDGE OF DYSPHAGIA/FLUID INTAKE Outcome: Progressing Goal: RH STG INCREASE KNOWLEGDE OF  HYPERLIPIDEMIA Outcome: Progressing Goal: RH STG INCREASE KNOWLEDGE OF STROKE PROPHYLAXIS Outcome: Progressing

## 2018-03-27 NOTE — Progress Notes (Signed)
[place] PHYSICAL MEDICINE & REHABILITATION PROGRESS NOTE  Subjective/Complaints: Nursing in room, taking medications  ROS: limited due to language/communication    Objective: Vital Signs: Blood pressure 126/69, pulse 71, temperature 97.8 F (36.6 C), resp. rate 16, height 5\' 5"  (1.651 m), weight 77.7 kg, SpO2 99 %. No results found. No results for input(s): WBC, HGB, HCT, PLT in the last 72 hours. No results for input(s): NA, K, CL, CO2, GLUCOSE, BUN, CREATININE, CALCIUM in the last 72 hours.  Physical Exam: BP 126/69 (BP Location: Right Arm)   Pulse 71   Temp 97.8 F (36.6 C)   Resp 16   Ht 5\' 5"  (1.651 m)   Wt 77.7 kg   SpO2 99%   BMI 28.51 kg/m  Constitutional: No distress . Vital signs reviewed. HEENT: EOMI, oral membranes moist Neck: supple Cardiovascular: RRR without murmur. No JVD    Respiratory: CTA Bilaterally without wheezes or rales. Normal effort    GI: BS +, non-tender, non-distended  Musc: No edema or tenderness in extremities. Neurological: He is alert Improving word findings. Still non-fluent Motor: Left upper extremity: Shoulder abduction, elbow flexion/extension 3-4/5, handgrip   3/5--stable to improved Left lower extremity: 3 to+/5 proximal distally  Skin: Skin is warm and dry.  Scalp wound CDI Psychiatric: in good spirits  Assessment/Plan: 1. 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  Physiatrist is providing close team supervision and 24 hour management of active medical problems listed below.  Physiatrist and rehab team continue to assess barriers to discharge/monitor patient progress toward functional and medical goals  Care Tool:  Bathing    Body parts bathed by patient: Chest, Abdomen, Front perineal area, Right upper leg, Left upper leg, Face, Left arm, Right lower leg, Left lower leg, Right arm   Body parts bathed by helper: Buttocks     Bathing assist Assist Level: Minimal Assistance - Patient > 75%     Upper Body Dressing/Undressing Upper body dressing   What is the patient wearing?: Pull over shirt    Upper body assist Assist Level: Supervision/Verbal cueing    Lower Body Dressing/Undressing Lower body dressing      What is the patient wearing?: Underwear/pull up, Pants     Lower body assist Assist for lower body dressing: Minimal Assistance - Patient > 75%     Toileting Toileting    Toileting assist Assist for toileting: Minimal Assistance - Patient > 75%     Transfers Chair/bed transfer  Transfers assist     Chair/bed transfer assist level: Minimal Assistance - Patient > 75%     Locomotion Ambulation   Ambulation assist      Assist level: Minimal Assistance - Patient > 75% Assistive device: Walker-rolling Max distance: 100   Walk 10 feet activity   Assist     Assist level: Minimal Assistance - Patient > 75% Assistive device: Walker-rolling   Walk 50 feet activity   Assist    Assist level: Minimal Assistance - Patient > 75% Assistive device: Walker-rolling    Walk 150 feet activity   Assist    Assist level: Minimal Assistance - Patient > 75% Assistive device: Walker-rolling    Walk 10 feet on uneven surface  activity   Assist Walk 10 feet on uneven surfaces activity did not occur: Safety/medical concerns         Wheelchair     Assist Will patient  use wheelchair at discharge?: Yes Type of Wheelchair: Manual    Wheelchair assist level: Minimal Assistance - Patient > 75% Max wheelchair distance: 159ft    Wheelchair 50 feet with 2 turns activity    Assist        Assist Level: Minimal Assistance - Patient > 75%   Wheelchair 150 feet activity     Assist     Assist Level: Supervision/Verbal cueing      Medical Problem List and Plan: 1.  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 Status post craniotomy 03/07/2018. Patient was to follow up with Dr.Vaslow and Dr. Tammi Klippel on discharge.   -Continue CIR therapies including PT, OT SLP  Decadron: decreased to 1mg  q12 1/2--wean to off next week 2.  DVT Prophylaxis/Anticoagulation: SCDs. 3. Pain Management: Tylenol as needed 4. Mood:  Provide emotional support 5. Neuropsych: This patient is not capable of making decisions on his own behalf. 6. Skin/Wound Care:  Routine skin checks  -removed sutures   7. Fluids/Electrolytes/Nutrition:  Routine in and out's  8. Seizures.   Keppra increased to 1500 twice daily  Consider addition of Vimpat if persistent breakthrough seizures  Controlled at present 9. Hypertension.   Lisinopril increased to 15 mg on 12/24  bp's better with decreasing steroids.  Vitals:   03/26/18 1948 03/27/18 0330  BP: 113/66 126/69  Pulse: 84 71  Resp: 16 16  Temp: 97.6 F (36.4 C) 97.8 F (36.6 C)  SpO2: 96% 99%  Well controlled 1/5 10.  Steroid-induced hyperglycemia on prediabetes. Glucophage 1000 mg twice a day. Monitor closely while on tapering s  CBG's falling with wean of decadron 1mg  2   times daily started on 1/2  CBG (last 3)  Recent Labs    03/26/18 1702 03/26/18 2304 03/27/18 0644  GLUCAP 146* 129* 145*  Blood sugar is coming down 11. Hyperlipidemia. Lipitor 12.  Hyponatremia   Sodium 133 12/30--recheck monday     13.  Hypoalbuminemia  Supplement initiated on 12/23 14.  Acute blood loss anemia  Hemoglobin 10.9 on 12/23  Continue to monitor--recheck monday 15. Borderline K+  4.7 on 12/30, recheck 03/28/2018     LOS: 16 days A FACE TO [place] Yilia Sacc[place] 03/27/2018, 11:28 AM

## 2018-03-27 NOTE — Progress Notes (Signed)
Occupational Therapy Session Note  Patient Details  Name: Bryan [name] MRN: [id number] Date of Birth: 1940-04-26  Today's Date: 03/27/2018 OT Group Time: 1100-1200 OT Group Time Calculation (min): 60 min  Skilled Therapeutic Interventions/Progress Updates:    Pt engaged in therapeutic w/c level dance group focusing on patient choice, UE/LE strengthening, salience, activity tolerance, and social participation. Pt was guided through various dance-based exercises involving UEs/LEs and trunk. All music was selected by group members. Emphasis placed on sustained attention and Lt UE/LE NMR. Pt with high(!) levels of participation throughout tx. Min vcs for increasing L UE involvement. Pts son and dtr were present and very involved during group. Pts son stood and sang pts favorite song for group members. Pt cried cheerfully at this time. Pt often initiated simultaneous UE/LE involvement. Dtr and son encouraged pt to sing along to familiar music. At end of session pt was returned to room by family.    Therapy Documentation Precautions:  Precautions Precautions: Fall Precaution Comments: expressive aphasia, L hemiplgia, impulsive Restrictions Weight Bearing Restrictions: No Pain: No s/s pain during tx    ADL: ADL Eating: Not assessed Grooming: Moderate assistance Where Assessed-Grooming: Wheelchair, Sitting at sink Upper Body Bathing: Moderate assistance Where Assessed-Upper Body Bathing: Wheelchair, Sitting at sink Lower Body Bathing: Maximal assistance Where Assessed-Lower Body Bathing: Wheelchair, Sitting at sink, Standing at sink Upper Body Dressing: Moderate assistance Where Assessed-Upper Body Dressing: Sitting at sink, Wheelchair Lower Body Dressing: Maximal assistance Where Assessed-Lower Body Dressing: Standing at sink, Sitting at sink, Wheelchair Toileting: Moderate assistance Where Assessed-Toileting: Glass blower/designer: Moderate assistance Toilet Transfer Method: Squat  pivot Toilet Transfer Equipment: Energy manager: Not assessed      Therapy/Group: Group Therapy  Bryan [name] A Eline Geng 03/27/2018, 12:56 PM

## 2018-03-28 ENCOUNTER — Inpatient Hospital Stay: Payer: Self-pay

## 2018-03-28 ENCOUNTER — Other Ambulatory Visit: Payer: Self-pay

## 2018-03-28 ENCOUNTER — Institutional Professional Consult (permissible substitution): Payer: Medicare Other | Admitting: Radiation Oncology

## 2018-03-28 ENCOUNTER — Inpatient Hospital Stay (HOSPITAL_COMMUNITY): Payer: Medicare Other | Admitting: Physical Therapy

## 2018-03-28 ENCOUNTER — Encounter: Payer: Self-pay | Admitting: Radiation Oncology

## 2018-03-28 ENCOUNTER — Inpatient Hospital Stay (HOSPITAL_COMMUNITY): Payer: Medicare Other | Admitting: Occupational Therapy

## 2018-03-28 ENCOUNTER — Inpatient Hospital Stay: Payer: Medicare Other | Attending: Radiation Oncology

## 2018-03-28 ENCOUNTER — Inpatient Hospital Stay (HOSPITAL_COMMUNITY): Payer: Medicare Other | Admitting: Speech Pathology

## 2018-03-28 ENCOUNTER — Telehealth: Payer: Self-pay | Admitting: Radiation Oncology

## 2018-03-28 DIAGNOSIS — Z794 Long term (current) use of insulin: Secondary | ICD-10-CM | POA: Insufficient documentation

## 2018-03-28 DIAGNOSIS — R112 Nausea with vomiting, unspecified: Secondary | ICD-10-CM | POA: Insufficient documentation

## 2018-03-28 DIAGNOSIS — C711 Malignant neoplasm of frontal lobe: Secondary | ICD-10-CM | POA: Insufficient documentation

## 2018-03-28 DIAGNOSIS — K219 Gastro-esophageal reflux disease without esophagitis: Secondary | ICD-10-CM | POA: Insufficient documentation

## 2018-03-28 DIAGNOSIS — E7800 Pure hypercholesterolemia, unspecified: Secondary | ICD-10-CM | POA: Insufficient documentation

## 2018-03-28 DIAGNOSIS — M25512 Pain in left shoulder: Secondary | ICD-10-CM | POA: Insufficient documentation

## 2018-03-28 DIAGNOSIS — I10 Essential (primary) hypertension: Secondary | ICD-10-CM | POA: Insufficient documentation

## 2018-03-28 DIAGNOSIS — Z87891 Personal history of nicotine dependence: Secondary | ICD-10-CM | POA: Insufficient documentation

## 2018-03-28 DIAGNOSIS — Z85828 Personal history of other malignant neoplasm of skin: Secondary | ICD-10-CM | POA: Insufficient documentation

## 2018-03-28 DIAGNOSIS — E079 Disorder of thyroid, unspecified: Secondary | ICD-10-CM | POA: Insufficient documentation

## 2018-03-28 DIAGNOSIS — E119 Type 2 diabetes mellitus without complications: Secondary | ICD-10-CM | POA: Insufficient documentation

## 2018-03-28 DIAGNOSIS — M1990 Unspecified osteoarthritis, unspecified site: Secondary | ICD-10-CM | POA: Insufficient documentation

## 2018-03-28 DIAGNOSIS — D496 Neoplasm of unspecified behavior of brain: Secondary | ICD-10-CM | POA: Insufficient documentation

## 2018-03-28 DIAGNOSIS — Z9181 History of falling: Secondary | ICD-10-CM | POA: Insufficient documentation

## 2018-03-28 DIAGNOSIS — Z923 Personal history of irradiation: Secondary | ICD-10-CM | POA: Insufficient documentation

## 2018-03-28 DIAGNOSIS — Z79899 Other long term (current) drug therapy: Secondary | ICD-10-CM | POA: Insufficient documentation

## 2018-03-28 DIAGNOSIS — Q043 Other reduction deformities of brain: Secondary | ICD-10-CM | POA: Insufficient documentation

## 2018-03-28 LAB — CBC
HCT: 35.2 % — ABNORMAL LOW (ref 39.0–52.0)
Hemoglobin: 11.8 g/dL — ABNORMAL LOW (ref 13.0–17.0)
MCH: 31.1 pg (ref 26.0–34.0)
MCHC: 33.5 g/dL (ref 30.0–36.0)
MCV: 92.6 fL (ref 80.0–100.0)
Platelets: 163 10*3/uL (ref 150–400)
RBC: 3.8 MIL/uL — ABNORMAL LOW (ref 4.22–5.81)
RDW: 13.4 % (ref 11.5–15.5)
WBC: 6.6 10*3/uL (ref 4.0–10.5)
nRBC: 0 % (ref 0.0–0.2)

## 2018-03-28 LAB — BASIC METABOLIC PANEL
ANION GAP: 8 (ref 5–15)
BUN: 27 mg/dL — ABNORMAL HIGH (ref 8–23)
CO2: 23 mmol/L (ref 22–32)
Calcium: 8.4 mg/dL — ABNORMAL LOW (ref 8.9–10.3)
Chloride: 99 mmol/L (ref 98–111)
Creatinine, Ser: 0.95 mg/dL (ref 0.61–1.24)
GFR calc Af Amer: >60 mL/min (ref >60)
Glucose, Bld: 166 mg/dL — ABNORMAL HIGH (ref 70–99)
POTASSIUM: 4.6 mmol/L (ref 3.5–5.1)
Sodium: 130 mmol/L — ABNORMAL LOW (ref 135–145)

## 2018-03-28 LAB — GLUCOSE, CAPILLARY
GLUCOSE-CAPILLARY: 121 mg/dL — AB (ref 70–99)
Glucose-Capillary: 163 mg/dL — ABNORMAL HIGH (ref 70–99)
Glucose-Capillary: 159 mg/dL — ABNORMAL HIGH (ref 70–99)
Glucose-Capillary: 103 mg/dL — ABNORMAL HIGH (ref 70–99)

## 2018-03-28 NOTE — Plan of Care (Signed)
  Problem: RH Simple Meal Prep Goal: LTG Patient will perform simple meal prep w/assist (OT) Description LTG: Patient will perform simple meal prep with assistance, with/without cues (OT). Outcome: Not Applicable Flowsheets (Taken 03/28/2018 0858) LTG: Pt will perform simple meal prep with assistance level of: -- (d/c goal 1/6) Note:  N/a, d/c goal 1/6

## 2018-03-28 NOTE — Telephone Encounter (Signed)
Phoned Lorre Nick, social worker [facility] [place], to arrange transportation for tomorrow's consultation with Dr. Tammi Klippel. Lorre Nick confirmed she will arrange for [facility] to pick up the patient around 0845 for a 0930 appointment. Also, she requested radiation appointments be made for late afternoon so patient can complete his therapy in the morning. Committed to sharing her request with staff. Emailed Press photographer with request.

## 2018-03-28 NOTE — Progress Notes (Signed)
Occupational Therapy Session Note  Patient Details  Name: Bryan [name] MRN: [id number] Date of Birth: 1941-03-22  Today's Date: 03/28/2018  Session 1 OT Individual Time: 1856-3149 OT Individual Time Calculation (min): 60 min   Session 2 OT Individual Time: 1500-1530 OT Individual Time Calculation (min): 30 min    Short Term Goals: Week 2:  OT Short Term Goal 1 (Week 2): Pt will use L UE for 50% of bathing tasks using velcro wash cloth OT Short Term Goal 2 (Week 2): Pt will maintain standing balance within BADL tasks with no more than min A OT Short Term Goal 3 (Week 2): Pt will complete toilet transfer with consistent min A  Skilled Therapeutic Interventions/Progress Updates:  Session 1   Pt greeted semi-reclined in bed and agreeable to OT treatment session requesting to shower. Pt came to sitting with CGA. Sit<>stand from EOB with RW, min A, and verbal cues for L hand placement on RW splint. Pt ambulated into bathroom with min/mod A and verbal cues for RW positioning when turning to sit on tub bench. Pt needed min A to don L wash mit, then was able to use L UE to wash body parts without cues. Pt with improved L shoulder activation and able to reach R shoulder to wash R arm today. Stand-pivot out of shower with grab bars and CGA. Pt completed grooming tasks at the sink with verbal cues for use of L UE as a stabilizer. Pt with improved grasp and able to maintain grip on deodorant today while removing lid. Pt still needs guided A to maintain grasp while bringing deodorant under arm. Dressing completed with min A wc at the sink. Pt left seated in wc at end of session with call bell in reach and needs met.  Pain:  none/denies pain  Session 2 Pt greeted seated in wc and agreeable to OT treatment session focused on L NMR. Pt brought to therapy gym in wc for time management. Brought pt into quadruped on therapy mat, then worked on reaching with R UE to facilitate more weight bearing through LUE.  OT provided joint stabilization at shoulder and hip. Pt then completed cup stacking activity with focus on wrist flex/ext and grasp/release. Pt with improved grip on cups, then able to stack cups with improved shoulder control. Facilitation for normal movement patterns. Pt returned to room and left seated in wc at end of session with safety belt on and needs met.   Therapy Documentation Precautions:  Precautions Precautions: Fall Precaution Comments: expressive aphasia, L hemiplgia, impulsive Restrictions Weight Bearing Restrictions: No Pain:  none/denies pain  Therapy/Group: Individual Therapy  Valma Cava 03/28/2018, 3:30 PM

## 2018-03-28 NOTE — Progress Notes (Signed)
[place] PHYSICAL MEDICINE & REHABILITATION PROGRESS NOTE  Subjective/Complaints:  up in bed. In good spirits. Denies pain  ROS: limited due to language/communication    Objective: Vital Signs: Blood pressure 139/77, pulse 79, temperature 98.2 F (36.8 C), temperature source Oral, resp. rate 18, height 5\' 5"  (1.651 m), weight 77.7 kg, SpO2 96 %. No results found. Recent Labs    03/28/18 0641  WBC 6.6  HGB 11.8*  HCT 35.2*  PLT 163   No results for input(s): NA, K, CL, CO2, GLUCOSE, BUN, CREATININE, CALCIUM in the last 72 hours.  Physical Exam: BP 139/77 (BP Location: Right Arm)   Pulse 79   Temp 98.2 F (36.8 C) (Oral)   Resp 18   Ht 5\' 5"  (1.651 m)   Wt 77.7 kg   SpO2 96%   BMI 28.51 kg/m  Constitutional: No distress . Vital signs reviewed. HEENT: EOMI, oral membranes moist Neck: supple Cardiovascular: RRR without murmur. No JVD    Respiratory: CTA Bilaterally without wheezes or rales. Normal effort    GI: BS +, non-tender, non-distended  Musc: No edema or tenderness in extremities. Neurological: He is alert Improving word findings. Still non-fluent Motor: Left upper extremity: Shoulder abduction, elbow flexion/extension 3-4/5, handgrip   3/5--stable to improved Left lower extremity: 3 to+/5 proximal distally  Skin: Skin is warm and dry.  Scalp wound CDI Psychiatric: in good spirits  Assessment/Plan: 1. 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  Physiatrist is providing close team supervision and 24 hour management of active medical problems listed below.  Physiatrist and rehab team continue to assess barriers to discharge/monitor patient progress toward functional and medical goals  Care Tool:  Bathing    Body parts bathed by patient: Chest, Abdomen, Front perineal area, Right upper leg, Left upper leg, Face, Left arm,  Right lower leg, Left lower leg, Right arm   Body parts bathed by helper: Buttocks     Bathing assist Assist Level: Minimal Assistance - Patient > 75%     Upper Body Dressing/Undressing Upper body dressing   What is the patient wearing?: Pull over shirt    Upper body assist Assist Level: Supervision/Verbal cueing    Lower Body Dressing/Undressing Lower body dressing      What is the patient wearing?: Underwear/pull up, Pants     Lower body assist Assist for lower body dressing: Minimal Assistance - Patient > 75%     Toileting Toileting    Toileting assist Assist for toileting: Minimal Assistance - Patient > 75%     Transfers Chair/bed transfer  Transfers assist     Chair/bed transfer assist level: Minimal Assistance - Patient > 75%     Locomotion Ambulation   Ambulation assist      Assist level: Minimal Assistance - Patient > 75% Assistive device: Walker-rolling Max distance: 100   Walk 10 feet activity   Assist     Assist level: Minimal Assistance - Patient > 75% Assistive device: Walker-rolling   Walk 50 feet activity   Assist    Assist level: Minimal Assistance - Patient > 75% Assistive device: Walker-rolling    Walk 150 feet activity   Assist    Assist level: Minimal Assistance - Patient > 75% Assistive device: Walker-rolling    Walk 10 feet on uneven surface  activity   Assist Walk 10 feet on uneven surfaces activity did not occur: Safety/medical  concerns         Wheelchair     Assist Will patient use wheelchair at discharge?: Yes Type of Wheelchair: Manual    Wheelchair assist level: Minimal Assistance - Patient > 75% Max wheelchair distance: 143ft    Wheelchair 50 feet with 2 turns activity    Assist        Assist Level: Minimal Assistance - Patient > 75%   Wheelchair 150 feet activity     Assist     Assist Level: Supervision/Verbal cueing      Medical Problem List and Plan: 1.   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 Status post craniotomy 03/07/2018. Patient was to follow up with Dr.Vaslow and Dr. Tammi Klippel on discharge.   -Continue CIR therapies including PT, OT SLP  Decadron: decreased to 1mg  q12 1/2--dc today 2.  DVT Prophylaxis/Anticoagulation: SCDs. 3. Pain Management: Tylenol as needed 4. Mood:  Provide emotional support 5. Neuropsych: This patient is not capable of making decisions on his own behalf. 6. Skin/Wound Care:  Routine skin checks  -removed sutures   7. Fluids/Electrolytes/Nutrition:  Routine in and out's  8. Seizures.   Keppra increased to 1500 twice daily  Consider addition of Vimpat if persistent breakthrough seizures  Controlled at present 9. Hypertension.   Lisinopril increased to 15 mg on 12/24  bp's improved with decreasing steroids.  Vitals:   03/27/18 2004 03/28/18 0528  BP: 115/68 139/77  Pulse: 79 79  Resp: 18 18  Temp: 98 F (36.7 C) 98.2 F (36.8 C)  SpO2: 95% 96%  Well controlled 1/6 10.  Steroid-induced hyperglycemia on prediabetes. Glucophage 1000 mg twice a day. Monitor closely while on tapering s  CBG's falling with wean of decadron    CBG (last 3)  Recent Labs    03/27/18 1621 03/27/18 2058 03/28/18 0632  GLUCAP 174* 108* 163*  Blood sugar is coming down/ dc decadron today 11. Hyperlipidemia. Lipitor 12.  Hyponatremia   Sodium 133 12/30---pending today     13.  Hypoalbuminemia  Supplement initiated on 12/23 14.  Acute blood loss anemia  Hemoglobin 10.9 on 12/23  Continue to monitor--recheck monday 15. Borderline K+  4.7 on 12/30, recheck 03/28/2018     LOS: 17 days A FACE TO [place] 03/28/2018, 8:40 AM

## 2018-03-28 NOTE — Progress Notes (Signed)
Speech Language Pathology Daily Session Note  Patient Details  Name: Bryan [name] MRN: [id number] Date of Birth: 1941/02/23  Today's Date: 03/28/2018 SLP Individual Time: 1030-1130 SLP Individual Time Calculation (min): 60 min  Short Term Goals: Week 3: SLP Short Term Goal 1 (Week 3): Pt will self-monitor and self-correct verbal errors in 7 out of 10 opportunities with Mod A verbal cues.  SLP Short Term Goal 2 (Week 3): Pt will read at the sentence level with ~ 75% accuracy and Mod A verbal cues to self-monitor and correct errors.  SLP Short Term Goal 3 (Week 3): Patient will write at the phrase level with 75% accuracy and Mod A verbal and visual cues to self-monitor and correct errors.  SLP Short Term Goal 4 (Week 3): Patient will express basic wants/needs at the phrase level with approrpiate syntax with Mod A verbal cues and 75% accuracy.  SLP Short Term Goal 5 (Week 3): Pt will complete basic familiar tasks with supervision A verbal cues.   Skilled Therapeutic Interventions: Skilled treatment session focused on cognitive-linguistic goals. SLP facilitated session by providing supervision verbal cues for functional problem solving during a basic money management task. Verbal cues needed due to impulsivity with task. SLP also facilitated session by providing Max-Total A multimodal cues for patient to generate a phrase during written expression that focused on the function of a functional item. Throughout session, patient with a hoarse vocal quality and low vocal quality that required Min-Mod A verbal cues for patient to self-monitor and correct. Patient also utilizes primarily accessory muscles for speech production and required Max A multimodal cues for use of diaphragmatic breathing. Patient also educated in regards to strategies to maximize vocal hygiene. Patient's children present and verbalized understanding of all information. Patient left upright in wheelchair with children present. Continue  with current plan of care.      Pain No/Denies Pain   Therapy/Group: Individual Therapy  Bryan [name] 03/28/2018, 12:49 PM

## 2018-03-28 NOTE — Plan of Care (Signed)
  Problem: Consults Goal: RH STROKE PATIENT EDUCATION Description See Patient Education module for education specifics  Outcome: Progressing Goal: Nutrition Consult-if indicated Outcome: Progressing   Problem: RH BOWEL ELIMINATION Goal: RH STG MANAGE BOWEL WITH ASSISTANCE Description STG Manage Bowel with min-mod Assistance.  Outcome: Progressing Goal: RH STG MANAGE BOWEL W/MEDICATION W/ASSISTANCE Description STG Manage Bowel with Medication with min-mod Assistance.  Outcome: Progressing   Problem: RH BLADDER ELIMINATION Goal: RH STG MANAGE BLADDER WITH ASSISTANCE Description STG Manage Bladder With min-mod  Assistance  Outcome: Progressing Goal: RH STG MANAGE BLADDER WITH MEDICATION WITH ASSISTANCE Description STG Manage Bladder With Medication With min-mod Assistance.  Outcome: Progressing   Problem: RH SKIN INTEGRITY Goal: RH STG SKIN FREE OF INFECTION/BREAKDOWN Outcome: Progressing Goal: RH STG MAINTAIN SKIN INTEGRITY WITH ASSISTANCE Description STG Maintain Skin Integrity With min Assistance.  Outcome: Progressing   Problem: RH SAFETY Goal: RH STG ADHERE TO SAFETY PRECAUTIONS W/ASSISTANCE/DEVICE Description STG Adhere to Safety Precautions With min-mod Assistance/Device.  Outcome: Progressing Flowsheets (Taken 03/28/2018 1438) STG:Pt will adhere to safety precautions with assistance/device: 4-Minimal assistance   Problem: RH COGNITION-NURSING Goal: RH STG USES MEMORY AIDS/STRATEGIES W/ASSIST TO PROBLEM SOLVE Description STG Uses Memory Aids/Strategies With min Assistance to Problem Solve.  Outcome: Progressing   Problem: RH PAIN MANAGEMENT Goal: RH STG PAIN MANAGED AT OR BELOW PT'S PAIN GOAL Outcome: Progressing   Problem: RH KNOWLEDGE DEFICIT Goal: RH STG INCREASE KNOWLEDGE OF DIABETES Outcome: Progressing Goal: RH STG INCREASE KNOWLEDGE OF DYSPHAGIA/FLUID INTAKE Outcome: Progressing Goal: RH STG INCREASE KNOWLEGDE OF HYPERLIPIDEMIA Outcome:  Progressing Goal: RH STG INCREASE KNOWLEDGE OF STROKE PROPHYLAXIS Outcome: Progressing

## 2018-03-28 NOTE — Progress Notes (Addendum)
Physical Therapy Session Note  Patient Details  Name: Bryan [name] MRN: [id number] Date of Birth: 1940/07/19  Today's Date: 03/28/2018 PT Individual Time: 4081-4481 PT Individual Time Calculation (min): 56 min   Short Term Goals: Week 3:  PT Short Term Goal 1 (Week 3): = LTG  Skilled Therapeutic Interventions/Progress Updates:  Pt received in w/c & agreeable to tx, denying c/o pain. Transported pt to gym via w/c total assist for time management. Pt completes sit<>stand with CGA and ambulates 100 ft with RW, L hand orthosis and min assist with decreased LLE hip flexion, dorsiflexion and foot clearance with therapist providing manual facilitation for weight shifting R and pelvic rotation on L to clear step. Pt performed LLE step ups on 3" step with LUE support and up to mod assist from therapist with max multimodal cuing for safety & technique with task focusing on LLE strengthening, NMR, & balance. Pt engaged in standing kick ball with up to mod assist for standing balance with task focusing on weight shifting, dynamic balance, and L LE NMR. Pt propelled w/c gym>dayroom>room with BLE with cuing for increased extension LLE with task focusing on L NMR. Pt transfers w/c<>nu-step with min assist and utilizes nu-step on level 5 x 10 minutes with LUE attachment to maintain grasp on handle; task focusing on global strengthening, L NMR, & endurance training. At end of session pt left sitting in w/c in room with chair alarm donned & needs at hand.  Therapy Documentation Precautions:  Precautions Precautions: Fall Precaution Comments: expressive aphasia, L hemiplgia, impulsive Restrictions Weight Bearing Restrictions: No   Therapy/Group: Individual Therapy  Waunita Schooner 03/28/2018, 3:58 PM

## 2018-03-28 NOTE — Progress Notes (Signed)
Social Work Patient ID: Bryan [name], male   DOB: 02-23-1941, 78 y.o.   MRN: [id number]   Have arranged for [facility] transport of pt to [facility] tomorrow 03/29/18 with pick up here ~ 9:00 am.  Carrye Goller, LCSW

## 2018-03-28 NOTE — Progress Notes (Signed)
Location/Histology of Brain Tumor: Glioblastoma, WHO grade IV  Patient presented with symptoms of:  Began in October and progressively got worse over three week period before evaluated by PCP ???Some word finding difficulty and some facial weakness.   Family noted drooping of the left side of his face. He/they describe some cognitive changes, such as difficulty finding words in certain situations. Also, the patient feels like his left leg is lagging a little behind his right.   Focal impairment, mostly with regards to language and cognition.   Difficult swallowing, unsteady gait, and left facial droop.   Past or anticipated interventions, if any, per neurosurgery: resection/craniotomy on 03/07/18  Past or anticipated interventions, if any, per medical oncology:   Dose of Decadron, if applicable: 4 mg daily  Recent neurologic symptoms, if any:   Seizures: no before surgery. keppra increased to 1500 bid considering addition of Vimpat if persistent breakthrough seizures   Headaches: no  Nausea: no  Dizziness/ataxia:   Difficulty with hand coordination:   Focal numbness/weakness:   Visual deficits/changes: no  Confusion/Memory deficits:   Painful bone metastases at present, if any:   SAFETY ISSUES:  Prior radiation? no  Pacemaker/ICD? no  Possible current pregnancy? no, male patient  Is the patient on methotrexate? no  Additional Complaints / other details: 78 year old male. Married.

## 2018-03-29 ENCOUNTER — Ambulatory Visit
Admission: RE | Admit: 2018-03-29 | Discharge: 2018-03-29 | Disposition: A | Discharge status: Home / Self Care | Payer: Medicare Other | Source: Ambulatory Visit | Attending: Radiation Oncology | Admitting: Radiation Oncology

## 2018-03-29 ENCOUNTER — Telehealth: Payer: Self-pay | Admitting: Pharmacist

## 2018-03-29 ENCOUNTER — Encounter: Payer: Self-pay | Admitting: Radiation Oncology

## 2018-03-29 ENCOUNTER — Inpatient Hospital Stay (HOSPITAL_BASED_OUTPATIENT_CLINIC_OR_DEPARTMENT_OTHER): Payer: Medicare Other | Admitting: Internal Medicine

## 2018-03-29 ENCOUNTER — Other Ambulatory Visit: Payer: Self-pay

## 2018-03-29 ENCOUNTER — Inpatient Hospital Stay (HOSPITAL_COMMUNITY): Payer: Medicare Other | Admitting: Physical Therapy

## 2018-03-29 ENCOUNTER — Inpatient Hospital Stay (HOSPITAL_COMMUNITY): Payer: Medicare Other | Admitting: Occupational Therapy

## 2018-03-29 ENCOUNTER — Inpatient Hospital Stay (HOSPITAL_COMMUNITY): Payer: Medicare Other | Admitting: Speech Pathology

## 2018-03-29 VITALS — BP 154/77 | HR 80 | Temp 97.3°F | Resp 18 | Ht 65.0 in | Wt 171.0 lb

## 2018-03-29 DIAGNOSIS — K219 Gastro-esophageal reflux disease without esophagitis: Secondary | ICD-10-CM

## 2018-03-29 DIAGNOSIS — E079 Disorder of thyroid, unspecified: Secondary | ICD-10-CM | POA: Insufficient documentation

## 2018-03-29 DIAGNOSIS — Z85828 Personal history of other malignant neoplasm of skin: Secondary | ICD-10-CM

## 2018-03-29 DIAGNOSIS — M25512 Pain in left shoulder: Secondary | ICD-10-CM | POA: Diagnosis not present

## 2018-03-29 DIAGNOSIS — C711 Malignant neoplasm of frontal lobe: Secondary | ICD-10-CM | POA: Insufficient documentation

## 2018-03-29 DIAGNOSIS — Z9889 Other specified postprocedural states: Secondary | ICD-10-CM | POA: Diagnosis not present

## 2018-03-29 DIAGNOSIS — R569 Unspecified convulsions: Secondary | ICD-10-CM | POA: Diagnosis not present

## 2018-03-29 DIAGNOSIS — Z794 Long term (current) use of insulin: Secondary | ICD-10-CM | POA: Diagnosis not present

## 2018-03-29 DIAGNOSIS — E119 Type 2 diabetes mellitus without complications: Secondary | ICD-10-CM | POA: Diagnosis not present

## 2018-03-29 DIAGNOSIS — C719 Malignant neoplasm of brain, unspecified: Secondary | ICD-10-CM

## 2018-03-29 DIAGNOSIS — J320 Chronic maxillary sinusitis: Secondary | ICD-10-CM | POA: Insufficient documentation

## 2018-03-29 DIAGNOSIS — Z923 Personal history of irradiation: Secondary | ICD-10-CM | POA: Diagnosis not present

## 2018-03-29 DIAGNOSIS — R609 Edema, unspecified: Secondary | ICD-10-CM | POA: Insufficient documentation

## 2018-03-29 DIAGNOSIS — R253 Fasciculation: Secondary | ICD-10-CM | POA: Insufficient documentation

## 2018-03-29 DIAGNOSIS — Z79899 Other long term (current) drug therapy: Secondary | ICD-10-CM | POA: Diagnosis not present

## 2018-03-29 DIAGNOSIS — E7800 Pure hypercholesterolemia, unspecified: Secondary | ICD-10-CM | POA: Insufficient documentation

## 2018-03-29 DIAGNOSIS — Z510 Encounter for antineoplastic radiation therapy: Secondary | ICD-10-CM | POA: Diagnosis not present

## 2018-03-29 DIAGNOSIS — I10 Essential (primary) hypertension: Secondary | ICD-10-CM

## 2018-03-29 DIAGNOSIS — Q043 Other reduction deformities of brain: Secondary | ICD-10-CM

## 2018-03-29 DIAGNOSIS — Z7952 Long term (current) use of systemic steroids: Secondary | ICD-10-CM | POA: Diagnosis not present

## 2018-03-29 DIAGNOSIS — Z87891 Personal history of nicotine dependence: Secondary | ICD-10-CM | POA: Insufficient documentation

## 2018-03-29 DIAGNOSIS — M129 Arthropathy, unspecified: Secondary | ICD-10-CM | POA: Insufficient documentation

## 2018-03-29 DIAGNOSIS — R471 Dysarthria and anarthria: Secondary | ICD-10-CM | POA: Diagnosis not present

## 2018-03-29 DIAGNOSIS — M1990 Unspecified osteoarthritis, unspecified site: Secondary | ICD-10-CM | POA: Diagnosis not present

## 2018-03-29 DIAGNOSIS — R112 Nausea with vomiting, unspecified: Secondary | ICD-10-CM

## 2018-03-29 DIAGNOSIS — C713 Malignant neoplasm of parietal lobe: Secondary | ICD-10-CM

## 2018-03-29 HISTORY — DX: Malignant neoplasm of brain, unspecified: C71.9

## 2018-03-29 LAB — GLUCOSE, CAPILLARY
GLUCOSE-CAPILLARY: 142 mg/dL — AB (ref 70–99)
Glucose-Capillary: 215 mg/dL — ABNORMAL HIGH (ref 70–99)
Glucose-Capillary: 113 mg/dL — ABNORMAL HIGH (ref 70–99)

## 2018-03-29 MED ORDER — LEVETIRACETAM 100 MG/ML PO SOLN
1500.0000 mg | Freq: Two times a day (BID) | ORAL | Status: DC
  Administered 2018-03-29 – 2018-04-06 (×16): 1500 mg via ORAL
  Filled 2018-03-29 (×15): qty 15

## 2018-03-29 MED ORDER — ONDANSETRON HCL 8 MG PO TABS: 8.0000 mg | ORAL_TABLET | Freq: Two times a day (BID) | ORAL | 1 refills | Status: DC | PRN

## 2018-03-29 MED ORDER — TEMOZOLOMIDE 140 MG PO CAPS: 140.0000 mg | ORAL_CAPSULE | Freq: Every day | ORAL | 0 refills | Status: AC

## 2018-03-29 NOTE — Progress Notes (Signed)
START ON PATHWAY REGIMEN - Neuro     One cycle, daily for 42 days concurrent with RT:     Temozolomide   **Always confirm dose/schedule in your pharmacy ordering system**  Patient Characteristics: Glioblastoma, Newly Diagnosed / Treatment Naive, Good Performance Status and/or Younger Patient, MGMT Promoter Unmethylated/Unknown Disease Classification: Glioma Disease Classification: Glioblastoma Disease Status: Newly Diagnosed / Treatment Naive Performance Status: Good Performance Status and/or Younger Patient MGMT Promoter Methylation Status: Awaiting Test Results Intent of Therapy: Non-Curative / Palliative Intent, Discussed with Patient

## 2018-03-29 NOTE — Progress Notes (Signed)
Speech Language Pathology Daily Session Note  Patient Details  Name: Bryan [name] MRN: [id number] Date of Birth: 01/20/1941  Today's Date: 03/29/2018 SLP Individual Time: 0730-0830 SLP Individual Time Calculation (min): 60 min  Short Term Goals: Week 3: SLP Short Term Goal 1 (Week 3): Pt will self-monitor and self-correct verbal errors in 7 out of 10 opportunities with Mod A verbal cues.  SLP Short Term Goal 2 (Week 3): Pt will read at the sentence level with ~ 75% accuracy and Mod A verbal cues to self-monitor and correct errors.  SLP Short Term Goal 3 (Week 3): Patient will write at the phrase level with 75% accuracy and Mod A verbal and visual cues to self-monitor and correct errors.  SLP Short Term Goal 4 (Week 3): Patient will express basic wants/needs at the phrase level with approrpiate syntax with Mod A verbal cues and 75% accuracy.  SLP Short Term Goal 5 (Week 3): Pt will complete basic familiar tasks with supervision A verbal cues.   Skilled Therapeutic Interventions: Skilled treatment session focused on speech goals. SLP facilitated session by providing overall Min A verbal and tactile cues for use of diaphragmatic breathing while in supine. Patient able to sustain phonation for an appropriate amount of time but continues to remain dysphonic with minimal ability to maximize vocal intensity. Patient able to express basic wants/needs at the phrase and sentence level with extra time and overall supervision verbal cues. Patient left upright in bed with all needs within reach. Continue with current plan of care.      Pain No/Denies Pain   Therapy/Group: Individual Therapy  Jonah Nestle 03/29/2018, 2:52 PM

## 2018-03-29 NOTE — Progress Notes (Signed)
Brain and Spine Tumor Board Documentation  Binyamin Nelis was presented by Cecil Cobbs, MD at Brain and Spine Tumor Board on 03/29/2018, which included representatives from neuro oncology, radiation oncology, surgical oncology, radiology, pathology, navigation.  Moataz was presented as a current patient with history of the following treatments:  .  Additionally, we reviewed previous medical and familial history, history of present illness, and recent lab results along with all available histopathologic and imaging studies. The tumor board considered available treatment options and made the following recommendations:  Concurrent chemo-radiation therapy    Tumor board is a meeting of clinicians from various specialty areas who evaluate and discuss patients for whom a multidisciplinary approach is being considered. Final determinations in the plan of care are those of the provider(s). The responsibility for follow up of recommendations given during tumor board is that of the provider.   Today's extended care, comprehensive team conference, [place] was not present for the discussion and was not examined.

## 2018-03-29 NOTE — Progress Notes (Signed)
Radiation Oncology         [phone] ________________________________  Initial Outpatient Consultation  Name: Bryan [name] MRN: [id number]  Date of Service: 03/29/2018 DOB: Mar 14, 1941  BW:LSLHT, Annie Main, MD  Ventura Sellers, MD   REFERRING PHYSICIAN: Ventura Sellers, MD  DIAGNOSIS: 78 y.o. male with newly diagnosed WHO grade IV glioblastoma s/p gross total resection 03/07/18.    ICD-10-CM   1. Glioblastoma of parietal lobe ([facility]) C71.3   2.  Glioblastoma, IDH-wildtype ([place])  C71.1   3. Glioblastoma multiforme of brain ([place]) C71.9     HISTORY OF PRESENT ILLNESS: Bryan [name] is a 78 y.o. male seen at the request of Dr. Mickeal Skinner for newly diagnosed WHO grade IV glioblastoma. He started having word-finding difficulty and some facial weakness in October 2019 that progressively worsened over three weeks before being evaluated. He presented to the emergency department in November 2019 after family noted drooping of the left side of his face. In addition, he/they described some cognitive changes, such as difficulty finding words in certain situations. The patient also felt like his left leg was lagging behind his right leg, however was still able to walk on his own with no falls. He denied having headaches or seizures at the time.   A CT scan of the head on 02/15/2018 identified a 3.9 x 3.3 x 2.9 cm cystic and solid mass within the right hemisphere with localized mass effect and approximately 5 mm midline shift to the left. Moderate edema was surrounding the mass lesion.   Brain MRI on 02/16/2018 showed a large, partially necrotic mass centered in the right frontal white matter, most consistent with a primary brain neoplasm, with a leftward midline shift measuring 4 mm.  He was started on Decadron 62m po QID with improvement in his neurologic symptoms.  CT C/A/P scans were performed for disease staging on 02/16/2018 did not show any acute abnormalities of the chest, abdomen, or  pelvis.  He was seen by Dr. VMickeal Skinneron 02/16/2018 and discharged home on 4 mg daily dexamethasone with [facility].   He subsequently underwent craniotomy with excision of the brain tumor on 03/07/2018 [facility] Pathology revealed glioblastoma, IDH-wildtype, WHO grade IV. Molecular studies were EGFR negative.  Post-op MRI of the brain on 03/08/2018 showed persistent thick enhancement along the superior medial margin of the resection cavity, worrisome for residual tumor.  He was maintained on Keppra for seizure prophylaxis in addition to Decadron. Postoperative course with noted dysarthria and facial weakness requiring intensive rehabilitation program inclusive of speech, occupational and physical therapy so [facility]. He has continued to make steady progress since that time and is currently down to 114mDecadron po BID.  He did not tolerate discontinuation of Decadron earlier this week due to increased dysarthria and feelings of "a thick tongue". These symptoms have resolved since resuming low dose decadron.  He also had some breakthrough seizure activity with facial twitching so his Keppra has been increased to 150037mo BID currently and he has not had further seizure activity.  The patient has kindly been referred today for discussion of adjuvant radiation treatment. He is accompanied by his wife and son.   PREVIOUS RADIATION THERAPY: No  PAST MEDICAL HISTORY:  Past Medical History:  Diagnosis Date  . Arthritis   . Brain cancer ([facility]  Glioblastoma  .  Diabetes mellitus without complication ([place])   . GERD (gastroesophageal reflux disease)   . Hypercholesterolemia   . Hypertension   . Thyroid disease       PAST SURGICAL HISTORY: Past Surgical History:  Procedure Laterality Date  . CRANIOTOMY    . HERNIA REPAIR    . 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 Diabetes Mellitus II Mother   . ALS Father     SOCIAL HISTORY:  Social History   Socioeconomic History  . Marital status: Married    Spouse name: Not on file  . Number of children: Not on file  . Years of education: Not on file  . Highest education level: Not on file  Occupational History  . Not on file  Social Needs  . Financial resource strain: Not on file  . Food insecurity:    Worry: Not on file    Inability: Not on file  . Transportation needs:    Medical: Not on file    Non-medical: Not on file  Tobacco Use  . Smoking status: Former Smoker    Packs/day: 0.25    Years: 3.00    Pack years: 0.75    Types: Cigarettes    Last attempt to quit: 03/23/1961    Years since quitting: 57.0  . Smokeless tobacco: Never Used  Substance and Sexual Activity  . Alcohol use: Yes    Alcohol/week: 1.0 standard drinks    Types: 1 Glasses of wine per week    Comment: weekly  . Drug use: Never  . Sexual activity: Not Currently  Lifestyle  . Physical activity:    Days per week: Not on file    Minutes per session: Not on file  . Stress: Not on file  Relationships  . Social connections:    Talks on phone: Not on file    Gets together: Not on file    Attends religious service: Not on file    Active member of club or organization: Not on file    Attends meetings of clubs or organizations: Not on file    Relationship status: Not on file  . Intimate partner violence:    Fear of current or ex partner: Not on file    Emotionally abused: Not on file    Physically abused: Not on file    Forced sexual activity: Not on file  Other Topics Concern  . Not on file  Social History Narrative   Married. Resides in [place].    ALLERGIES: Other  MEDICATIONS:  No current facility-administered medications for this encounter.    Current Outpatient Medications   Medication Sig Dispense Refill  . ondansetron (ZOFRAN) 8 MG tablet Take 1 tablet (8 mg total) by mouth 2 (two) times daily as needed (nausea and vomiting). May take 30-61mn prior to chemo if nausea/vomiting 30 tablet 1  . temozolomide (TEMODAR) 140 MG capsule Take 1 capsule (140 mg total) by mouth daily. May take on an empty stomach to decrease nausea & vomiting. 42 capsule 0   Facility-Administered Medications Ordered in Other Encounters  Medication Dose Route Frequency Provider Last Rate Last Dose  . acetaminophen (TYLENOL) tablet 650 mg  650 mg Oral Q6H PRN ACathlyn Parsons PA-C   650 mg at 04/01/18 1257  . atorvastatin (LIPITOR) tablet 40 mg  40 mg Oral q1800 ACathlyn Parsons PA-C   40  mg at 04/01/18 1838  . feeding supplement (PRO-STAT SUGAR FREE 64) liquid 30 mL  30 mL Oral BID Jamse Arn, MD   30 mL at 04/02/18 0858  . fluticasone (FLONASE) 50 MCG/ACT nasal spray 1 spray  1 spray Each Nare Daily AngiulliLavon Paganini, PA-C   1 spray at 04/02/18 0900  . insulin aspart (novoLOG) injection 0-9 Units  0-9 Units Subcutaneous TID WC Marletta Lor, MD   1 Units at 04/01/18 1838  . levETIRAcetam (KEPPRA) 100 MG/ML solution 1,500 mg  1,500 mg Oral BID Meredith Staggers, MD   1,500 mg at 04/02/18 0859  . levothyroxine (SYNTHROID, LEVOTHROID) tablet 75 mcg  75 mcg Oral Q0600 Cathlyn Parsons, PA-C   75 mcg at 04/02/18 0538  . lisinopril (PRINIVIL,ZESTRIL) tablet 15 mg  15 mg Oral Daily Jamse Arn, MD   15 mg at 04/02/18 0859  . metFORMIN (GLUCOPHAGE) tablet 1,000 mg  1,000 mg Oral BID WC AngiulliLavon Paganini, PA-C   1,000 mg at 04/02/18 0859  . multivitamins with iron tablet 1 tablet  1 tablet Oral Daily Cathlyn Parsons, PA-C   1 tablet at 04/02/18 0859  . pantoprazole (PROTONIX) EC tablet 40 mg  40 mg Oral Daily Cathlyn Parsons, PA-C   40 mg at 04/02/18 0900  . polyethylene glycol (MIRALAX / GLYCOLAX) packet 17 g  17 g Oral Daily PRN Cathlyn Parsons, PA-C   17 g at  03/20/18 0641  . senna-docusate (Senokot-S) tablet 1 tablet  1 tablet Oral QHS PRN Cathlyn Parsons, PA-C   1 tablet at 03/20/18 2018    REVIEW OF SYSTEMS:  On review of systems, the patient reports that he is doing well overall. He denies any chest pain, shortness of breath, cough, fevers, chills, night sweats, or unintended weight changes. He denies any bowel or bladder disturbances, and denies abdominal pain, nausea or vomiting. He denies any new musculoskeletal or joint aches or pains. He reports headaches prior to surgery and for the first few days in rehab. He denied having any seizures before his surgery, however seizures began on 12/22 and persisted for 4 days. Family reports seizure activity consisted of left facial twitching that radiated down left arm. Keppra increased to 159m BID. He reports difficulty with left hand strength/coordination but this is steadily improving. He reports left leg/foot weakness has improved slightly and is greater with fatigue. He reports word-finding has greatly improved. He reports intermittent odd sound in his left ear "like a whisper". A complete review of systems is obtained and is otherwise negative.    PHYSICAL EXAM:  Wt Readings from Last 3 Encounters:  03/30/18 172 lb 7.8 oz (78.2 kg)  03/29/18 171 lb (77.6 kg)  03/10/18 169 lb 15.6 oz (77.1 kg)   Temp Readings from Last 3 Encounters:  04/02/18 98.1 F (36.7 C) (Oral)  03/29/18 (!) 97.3 F (36.3 C)  02/16/18 97.7 F (36.5 C) (Oral)   BP Readings from Last 3 Encounters:  04/02/18 119/65  03/29/18 (!) 154/77  02/16/18 129/68   Pulse Readings from Last 3 Encounters:  04/02/18 92  03/29/18 80  02/16/18 75   Pain Assessment Pain Score: 0-No pain/10  In general this is a healthy appearing caucasian male, resting comfortably on a stretcher, in no acute distress. He is alert and oriented x4 and appropriate throughout the examination. HEENT reveals a right sided craniotomy incision is clean,  dry, intact, and without signs of infection. EOMs are  intact. PERRLA. Skin is intact without any evidence of gross lesions. Cardiovascular exam reveals a regular rate and rhythm, no clicks rubs or murmurs are auscultated. Chest is clear to auscultation bilaterally. Lymphatic assessment is performed and does not reveal any adenopathy in the cervical, supraclavicular, or axillary chains. Abdomen has active bowel sounds in all quadrants and is intact. The abdomen is soft, non tender, non distended. Lower extremities are negative for pretibial pitting edema, deep calf tenderness, cyanosis or clubbing. Strength is 3/5 in the left upper and lower extremities as compared to 5/5 on the right.  Sensation is intact to light touch in the upper and lower extremities. He continues with mild word finding but otherwise, speech is well formed, well organized and with appropriate responses.   KPS = 70  100 - Normal; no complaints; no evidence of disease. 90   - Able to carry on normal activity; minor signs or symptoms of disease. 80   - Normal activity with effort; some signs or symptoms of disease. 44   - Cares for self; unable to carry on normal activity or to do active work. 60   - Requires occasional assistance, but is able to care for most of his personal needs. 50   - Requires considerable assistance and frequent medical care. 41   - Disabled; requires special care and assistance. 12   - Severely disabled; hospital admission is indicated although death not imminent. 40   - Very sick; hospital admission necessary; active supportive treatment necessary. 10   - Moribund; fatal processes progressing rapidly. 0     - Dead  Karnofsky DA, Abelmann Sherman, Craver LS and Burchenal [facility] [phone]) The use of the nitrogen mustards in the palliative treatment of carcinoma: with particular reference to bronchogenic carcinoma Cancer 1 634-56  LABORATORY DATA:  Lab Results  Component Value Date   WBC 6.6 03/28/2018   HGB 11.8  (L) 03/28/2018   HCT 35.2 (L) 03/28/2018   MCV 92.6 03/28/2018   PLT 163 03/28/2018   Lab Results  Component Value Date   NA 132 (L) 03/30/2018   K 4.7 03/30/2018   CL 99 03/30/2018   CO2 26 03/30/2018   Lab Results  Component Value Date   ALT 29 03/14/2018   AST 18 03/14/2018   ALKPHOS 34 (L) 03/14/2018   BILITOT 0.5 03/14/2018     RADIOGRAPHY: Ct Head Wo Contrast  Result Date: 03/13/2018 CLINICAL DATA:  Seizure.  Craniotomy for mass 03/07/2018. EXAM: CT HEAD WITHOUT CONTRAST TECHNIQUE: Contiguous axial images were obtained from the base of the skull through the vertex without intravenous contrast. COMPARISON:  MRI 02/16/2018.  CT 02/15/2018. 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 Some fluid and blood products in the post resection cavity as expected. Small amount of subdural fluid, air and blood at the site of the craniotomy as expected. Mild mass effect with right-to-left shift of 4 mm as seen previously. No unexpected hemorrhage or mass effect. No sign ischemic infarction. No hydrocephalus. Vascular: There is atherosclerotic calcification of the major vessels at the base of the brain. Skull: Right pterional craniotomy as above. Sinuses/Orbits: Chronic left maxillary sinusitis. Other sinuses are clear. Orbits are negative. 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 Expected postoperative findings. Mild persistent mass effect with right-to-left shift of 4 mm. Electronically Signed   By: Nelson Chimes M.D.   On: 03/13/2018 20:17  IMPRESSION/PLAN: 1. 78 y.o. male with newly diagnosed WHO grade IV glioblastoma s/p gross total resection on 03/07/18. At this point, the patient would potentially benefit from adjuvant postoperative chemoradiotherapy. We talked to the patient and family about the findings and workup thus far. We  discussed the natural history of WHO grade IV glioblastoma and general treatment, highlighting the role of radiotherapy in the management. We discussed the available radiation techniques, and focused on the details of logistics and delivery. The recommendation is to proceed with a 6 week course of daily radiotherapy to the brain concurrent with systemic chemotherapy, Temodar. We reviewed the anticipated acute and late sequelae associated with radiation in this setting. The patient and his family were encouraged to ask questions that were answered to their satisfaction.  At the conclusion of the conversation, the patient elects to proceed with concurrent chemoradiotherapy.  He will meet with Dr. Mickeal Skinner following our visit today to further discuss systemic chemotherapy recommendations. We will proceed with CT SIM today in anticipation of beginning radiotherapy on Monday, 04/04/18. He has freely signed written consent to proceed today in the office and a copy of this document is placed in his chart.  We spent 60 minutes face to face with the patient and more than 50% of that time was spent in counseling and/or coordination of care.     Nicholos Johns, PA-C    Tyler Pita, MD  [facility] Direct Dial: [phone]  Fax: [phone] [email or web address]  Skype  [email or web address]  This document serves as a record of services personally performed by Tyler Pita, MD and Freeman Caldron, PA-C. It was created on their behalf by Rae Lips, a trained medical scribe. The creation of this record is based on the scribe's personal observations and the providers' statements to them. This document has been checked and approved by the attending providers.

## 2018-03-29 NOTE — Progress Notes (Addendum)
Location/Histology of Brain Tumor: Glioblastoma, WHO grade IV  Patient presented with symptoms of:  Began in October and progressively got worse over three week period before evaluated by PCP, Roque Cash. Some word finding difficulty and some facial weakness.   Family noted drooping of the left side of his face. He/they describe some cognitive changes, such as difficulty finding words in certain situations. Also, the patient feels like his left leg is lagging a little behind his right.   Focal impairment, mostly with regards to language and cognition.   Difficult swallowing, unsteady gait, and left facial droop.   Past or anticipated interventions, if any, per neurosurgery: resection/craniotomy on 03/07/18  Past or anticipated interventions, if any, per medical oncology: consulted by Dr. Jiles Garter  Dose of Decadron, if applicable: 1 mg every 12 hours.  Recent neurologic symptoms, if any:   Seizures: none before surgery. keppra increased to 1500 bid. Seizures began on 03/13/18 and persisted for four days. Family reports seizure activity consisted of left facial twitching that radiated down left arm.   Headaches: yes prior to surgery and for the first few days in rehab  Nausea: no  Dizziness/ataxia: no  Difficulty with hand coordination: yes, left hand only but steadily improving  Focal numbness/weakness: yes, left foot greater with fatique  Visual deficits/changes: no  Confusion/Memory deficits: word finding has greatly improved  Intermittent odd sound in left ear like a whisper  Painful bone metastases at present, if any: no  SAFETY ISSUES:  Prior radiation? no  Pacemaker/ICD? no  Possible current pregnancy? no, male patient  Is the patient on methotrexate? no  Additional Complaints / other details: 78 year old male. Married. Accompanied today by his son. Resides in [place].

## 2018-03-29 NOTE — Progress Notes (Signed)
Patient will be evaluated in Radiation Oncology exam room since he is on a stretcher.

## 2018-03-29 NOTE — Progress Notes (Signed)
Occupational Therapy Session Note  Patient Details  Name: Bryan [name] MRN: [id number] Date of Birth: March 04, 1941  Today's Date: 03/29/2018 Session 1 OT Individual Time: 9326-7124 OT Individual Time Calculation (min): 30 min   Session 2 OT Individual Time: 5809-9833 OT Individual Time Calculation (min): 29 min    Short Term Goals: Week 2:  OT Short Term Goal 1 (Week 2): Pt will use L UE for 50% of bathing tasks using velcro wash cloth OT Short Term Goal 2 (Week 2): Pt will maintain standing balance within BADL tasks with no more than min A OT Short Term Goal 3 (Week 2): Pt will complete toilet transfer with consistent min A  Skilled Therapeutic Interventions/Progress Updates:    Session 1 Pt greeted sitting in wc and agreeable to OT treatment session. L NMR with tan theraputty with focus on weight bearing, grip, and pinch. Pt with improved hand flex/ext. Pt left seated in wc at end of session with needs met. Denies pain  Session 2 Pt greeted seated in wc after just receiving lunch tray. Educated pt on use of L hand to stabilize plate and food objects. Pt needed assistance to open containers and cut food, but then able to self-feed without assistance. Pt requests to shower. Pt completed stand-pivot in/out of shower with CGA. Bathing completed with min A overall and min A for balance when standing to wash buttocks. Incorporated L NMR with weight bearing through grab bar in standing. Pt able to complete UB dressing with supervision today and verbal cues for hemi techniques. OT assisted with LB dressing for time management. Pt left seated in wc at end of session with safety belt on and needs met. No pain reported.    Therapy Documentation Precautions:  Precautions Precautions: Fall Precaution Comments: expressive aphasia, L hemiplgia, impulsive Restrictions Weight Bearing Restrictions: No Pain:  none/denies pain  Therapy/Group: Individual Therapy  Valma Cava 03/29/2018, 3:29  PM

## 2018-03-29 NOTE — Progress Notes (Signed)
Physical Therapy Session Note  Patient Details  Name: Bryan [name] MRN: [id number] Date of Birth: [birth year]  Skilled Therapeutic Interventions/Progress Updates:    pt missed 60 minutes skilled PT due to being off unit for radiation/oncology procedure  Therapy Documentation Precautions:  Precautions Precautions: Fall Precaution Comments: expressive aphasia, L hemiplgia, impulsive Restrictions Weight Bearing Restrictions: No General: PT Amount of Missed Time (min): 60 Minutes PT Missed Treatment Reason: Out of hospital appointment   [facility] 03/29/2018, 1:53 PM

## 2018-03-29 NOTE — Progress Notes (Signed)
[place] PHYSICAL MEDICINE & REHABILITATION PROGRESS NOTE  Subjective/Complaints: Patient alert and sitting in bed.  Pointed to therapy schedule as he is anticipating starting his therapy soon..  ROS: limited due to language/communication   Objective: Vital Signs: Blood pressure 132/70, pulse 77, temperature 98.2 F (36.8 C), temperature source Oral, resp. rate 18, height 5\' 5"  (1.651 m), weight 77.7 kg, SpO2 98 %. No results found. Recent Labs    03/28/18 0641  WBC 6.6  HGB 11.8*  HCT 35.2*  PLT 163   Recent Labs    03/28/18 0641  NA 130*  K 4.6  CL 99  CO2 23  GLUCOSE 166*  BUN 27*  CREATININE 0.95  CALCIUM 8.4*    Physical Exam: BP 132/70   Pulse 77   Temp 98.2 F (36.8 C) (Oral)   Resp 18   Ht 5\' 5"  (1.651 m)   Wt 77.7 kg   SpO2 98%   BMI 28.51 kg/m  Constitutional: No distress . Vital signs reviewed. HEENT: EOMI, oral membranes moist.  Right cranial incision clean and intact Neck: supple Cardiovascular: RRR without murmur. No JVD    Respiratory: CTA Bilaterally without wheezes or rales. Normal effort    GI: BS +, non-tender, non-distended  Musc: No edema or tenderness in extremities. Neurological: He is alert Improving word findings. Still non-fluent but improving with automatic language Motor: Left upper extremity: Shoulder abduction, elbow flexion/extension 3-4/5, handgrip   3/5--stable to improved Left lower extremity: 3 to+/5 proximal distally --stable Skin: Skin is warm and dry.  Scalp wound CDI Psychiatric: Pleasant as always  Assessment/Plan: 1. 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  Physiatrist is providing close team supervision and 24 hour management of active medical problems listed below.  Physiatrist and rehab team continue to assess barriers to discharge/monitor patient progress toward  functional and medical goals  Care Tool:  Bathing    Body parts bathed by patient: Chest, Abdomen, Front perineal area, Right upper leg, Left upper leg, Face, Left arm, Right lower leg, Left lower leg, Right arm, Buttocks   Body parts bathed by helper: Buttocks     Bathing assist Assist Level: Contact Guard/Touching assist     Upper Body Dressing/Undressing Upper body dressing   What is the patient wearing?: Pull over shirt    Upper body assist Assist Level: Contact Guard/Touching assist    Lower Body Dressing/Undressing Lower body dressing      What is the patient wearing?: Underwear/pull up, Pants     Lower body assist Assist for lower body dressing: Contact Guard/Touching assist     Toileting Toileting    Toileting assist Assist for toileting: Minimal Assistance - Patient > 75%     Transfers Chair/bed transfer  Transfers assist     Chair/bed transfer assist level: Minimal Assistance - Patient > 75%     Locomotion Ambulation   Ambulation assist      Assist level: Minimal Assistance - Patient > 75% Assistive device: Walker-rolling(L hand orthosis) Max distance: 80 ft    Walk 10 feet activity   Assist     Assist level: Minimal Assistance - Patient > 75% Assistive device: Walker-rolling(L hand orthosis)   Walk 50 feet activity   Assist    Assist level: Minimal Assistance - Patient > 75% Assistive device: Walker-rolling(L hand orthosis)    Walk 150 feet activity   Assist  Assist level: Minimal Assistance - Patient > 75% Assistive device: Walker-rolling    Walk 10 feet on uneven surface  activity   Assist Walk 10 feet on uneven surfaces activity did not occur: Safety/medical concerns         Wheelchair     Assist Will patient use wheelchair at discharge?: Yes Type of Wheelchair: Manual    Wheelchair assist level: Supervision/Verbal cueing Max wheelchair distance: 150 ft(BLE)    Wheelchair 50 feet with 2 turns  activity    Assist        Assist Level: Supervision/Verbal cueing   Wheelchair 150 feet activity     Assist     Assist Level: Supervision/Verbal cueing      Medical Problem List and Plan: 1.  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 Status post craniotomy 03/07/2018. Patient was to follow up with Dr.Vaslow and Dr. Tammi Klippel on discharge.   Team conference today  Decadron discontinued 2.  DVT Prophylaxis/Anticoagulation: SCDs. 3. Pain Management: Tylenol as needed 4. Mood:  Provide emotional support 5. Neuropsych: This patient is not capable of making decisions on his own behalf. 6. Skin/Wound Care:  Routine skin checks  -removed sutures   7. Fluids/Electrolytes/Nutrition:  Routine in and out's   -BUN increased, push p.o. fluids 8. Seizures.   Keppra maintained at 1500 twice daily  Consider addition of Vimpat if persistent breakthrough seizures  Controlled at present 9. Hypertension.   Lisinopril increased to 15 mg on 12/24  bp's improved with decreasing steroids.  Vitals:   03/29/18 0352 03/29/18 0738  BP: 126/67 132/70  Pulse: 77   Resp: 18   Temp: 98.2 F (36.8 C)   SpO2: 98%   Well controlled 1/7 10.  Steroid-induced hyperglycemia on prediabetes. Glucophage 1000 mg twice a day. Monitor closely while on tapering s  CBG's falling with wean of decadron    CBG (last 3)  Recent Labs    03/28/18 1618 03/28/18 2102 03/29/18 0619  GLUCAP 159* 103* 142*  Blood sugar improving nicely off steroids 11. Hyperlipidemia. Lipitor 12.  Hyponatremia   Sodium 133 12/30---> sodium 130 on 1/6  -recheck tomorrow     13.  Hypoalbuminemia  Supplement initiated on 12/23 14.  Acute blood loss anemia  Hemoglobin 11.8 on 1/6  Continue to monitor--recheck monday 15. Borderline K+  4.6 on 1/6     LOS: 18 days A FACE TO Peters 03/29/2018, 9:05 AM

## 2018-03-29 NOTE — Progress Notes (Signed)
[facility] [place], [place] [address] [phone]   New Patient Evaluation  Date of Service: 03/29/18 Patient Name: Bryan [name] Patient MRN: [id number] Patient DOB: 11-Mar-1941 Provider: Ventura Sellers, MD  Identifying Statement:  [name] is a 78 y.o. male with right frontal glioblastoma who presents for initial consultation and evaluation.    Referring Provider: Reynold Bowen, MD [place], [place] [address]  Oncologic History:    Glioblastoma, IDH-wildtype [facility])    03/07/2018 Surgery    Craniotomy, resection by Dr. Tommi Rumps ([facility]).  Path is Glioblastoma     Biomarkers:  MGMT Unknown.  IDH 1/2 Wild type.  EGFR Expressed  TERT Unknown   History of Present Illness: The patient's records from the referring physician were obtained and reviewed and the patient interviewed to confirm this HPI.  [name] presents today for follow up, now 3 weeks removed from surgery with Dr. Tommi Rumps at [facility].  He describes loss of function of left arm and leg following surgery, but significant improvement during his stay at inpatient rehab.  He is now ambulating with a walker, still has clumsiness affecting the left hand without frank weakness.  His speech is more clear and fluid as well.  He did have several seizures while working with PT on 12/22 (unclear semiology) and has been without any events since starting [place] therapy.  Otherwise no new neurologic deficits since our initial consult.  H+P (02/16/18) Bryan [name] 78 y.o. male presented after family noted drooping of the left side of his face yesterday.  In addition, he/they describe some cognitive changes, such as difficulty "finding words" in certain situations.  The patient also feels like his left leg is "lagging" a little behind his right lately.  He is walking on his own, however, and with no falls.  Denies headaches, seizures.   Medications: Current  Facility-Administered Medications on File Prior to Visit  Medication Dose Route Frequency Provider Last Rate Last Dose  . acetaminophen (TYLENOL) tablet 650 mg  650 mg Oral Q6H PRN Cathlyn Parsons, PA-C   650 mg at 03/22/18 0546  . atorvastatin (LIPITOR) tablet 40 mg  40 mg Oral q1800 Cathlyn Parsons, PA-C   40 mg at 03/28/18 1713  . feeding supplement (PRO-STAT SUGAR FREE 64) liquid 30 mL  30 mL Oral BID Jamse Arn, MD   30 mL at 03/29/18 0745  . fluticasone (FLONASE) 50 MCG/ACT nasal spray 1 spray  1 spray Each Nare Daily Cathlyn Parsons, PA-C   1 spray at 03/29/18 0739  . insulin aspart (novoLOG) injection 0-9 Units  0-9 Units Subcutaneous TID WC Marletta Lor, MD   1 Units at 03/29/18 [phone]  . levETIRAcetam (KEPPRA) tablet 1,500 mg  1,500 mg Oral BID Skeet Simmer, RPH   1,500 mg at 03/29/18 3016  . levothyroxine (SYNTHROID, LEVOTHROID) tablet 75 mcg  75 mcg Oral Q0600 Cathlyn Parsons, PA-C   75 mcg at 03/29/18 0559  . lisinopril (PRINIVIL,ZESTRIL) tablet 15 mg  15 mg Oral Daily Jamse Arn, MD   15 mg at 03/29/18 0739  . metFORMIN (GLUCOPHAGE) tablet 1,000 mg  1,000 mg Oral BID WC AngiulliLavon Paganini, PA-C   1,000 mg at 03/29/18 0109  . multivitamins with iron tablet 1 tablet  1 tablet Oral Daily Cathlyn Parsons, PA-C   1 tablet at 03/29/18 0745  . pantoprazole (PROTONIX) EC tablet 40 mg  40 mg  Oral Daily Cathlyn Parsons, PA-C   40 mg at 03/29/18 1751  . polyethylene glycol (MIRALAX / GLYCOLAX) packet 17 g  17 g Oral Daily PRN Cathlyn Parsons, PA-C   17 g at 03/20/18 0641  . senna-docusate (Senokot-S) tablet 1 tablet  1 tablet Oral QHS PRN Cathlyn Parsons, PA-C   1 tablet at 03/20/18 2018   Current Outpatient Medications on File Prior to Visit  Medication Sig Dispense Refill  . acetaminophen (TYLENOL) 500 MG tablet Take 1,000 mg by mouth as needed for mild pain.    [name] atorvastatin (LIPITOR) 40 MG tablet Take 40 mg by mouth daily.    .  diphenhydrAMINE (BENADRYL) 25 MG tablet Take by mouth.    . famotidine (PEPCID) 10 MG tablet Take 10 mg by mouth as needed for heartburn or indigestion.    . Flaxseed, Linseed, (FLAXSEED OIL PO) Take by mouth.    . fluticasone (FLONASE) 50 MCG/ACT nasal spray Place 1 spray into both nostrils 2 (two) times daily.    . Ginkgo Biloba Extract (GINKGO BILOBA MEMORY ENHANCER) 60 MG CAPS Take by mouth.    [name] ibuprofen (ADVIL,MOTRIN) 200 MG tablet Take 200 mg by mouth every 6 (six) hours as needed for moderate pain.    [name] levothyroxine (SYNTHROID, LEVOTHROID) 75 MCG tablet Take 75 mcg by mouth daily before breakfast.    . lisinopril (PRINIVIL,ZESTRIL) 10 MG tablet Take 10 mg by mouth daily.    . metFORMIN (GLUCOPHAGE) 500 MG tablet Take 1,000 mg by mouth 2 (two) times daily with a meal.    . Multiple Vitamins-Minerals (MULTI-DAY PLUS MINERALS) TABS Take 1 tablet by mouth daily.    . naphazoline-pheniramine (NAPHCON-A) 0.025-0.3 % ophthalmic solution Place 1 drop into both eyes as needed for eye irritation or allergies.    . Omega-3 1000 MG CAPS Take by mouth.      Allergies:  Allergies  Allergen Reactions  . Other     Cats, pollen   Past Medical History:  Past Medical History:  Diagnosis Date  . Arthritis   . Brain cancer ([place])    Glioblastoma  . Diabetes mellitus without complication ([place])   . GERD (gastroesophageal reflux disease)   . Hypercholesterolemia   . Hypertension   . Thyroid disease    Past Surgical History:  Past Surgical History:  Procedure Laterality Date  . CRANIOTOMY    . HERNIA REPAIR    . 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 Marital status: Married    Spouse name: Not on file  . Number of children: Not on file  . Years of education: Not on file  . Highest education level: Not on file  Occupational History  . Not on file  Social Needs  . Financial resource strain: Not on  file  . Food insecurity:    Worry: Not on file    Inability: Not on file  . Transportation needs:    Medical: Not on file    Non-medical: Not on file  Tobacco Use  . Smoking status: Former Smoker    Packs/day: 0.25    Years: 3.00    Pack years: 0.75    Types: Cigarettes    Last attempt to quit: 03/23/1961    Years since quitting: 57.0  . Smokeless tobacco: Never Used  Substance and Sexual Activity  . Alcohol use: Yes  Alcohol/week: 1.0 standard drinks    Types: 1 Glasses of wine per week    Comment: weekly  . Drug use: Never  . Sexual activity: Not Currently  Lifestyle  . Physical activity:    Days per week: Not on file    Minutes per session: Not on file  . Stress: Not on file  Relationships  . Social connections:    Talks on phone: Not on file    Gets together: Not on file    Attends religious service: Not on file    Active member of club or organization: Not on file    Attends meetings of clubs or organizations: Not on file    Relationship status: Not on file  . Intimate partner violence:    Fear of current or ex partner: Not on file    Emotionally abused: Not on file    Physically abused: Not on file    Forced sexual activity: Not on file  Other Topics Concern  . Not on file  Social History Narrative   Married. Resides in [place].   Family History:  Family History  Problem Relation Age of Onset  . Diabetes Mellitus II Mother   . ALS Father     Review of Systems: Constitutional: Denies fevers, chills or abnormal weight loss Eyes: Denies blurriness of vision Ears, nose, mouth, throat, and face: Denies mucositis or sore throat Respiratory: Denies cough, dyspnea or wheezes Cardiovascular: Denies palpitation, chest discomfort or lower extremity swelling Gastrointestinal:  Denies nausea, constipation, diarrhea GU: Denies dysuria or incontinence Skin: Denies abnormal skin rashes Neurological: Per HPI Musculoskeletal: Denies joint pain, back or neck  discomfort. No decrease in ROM Behavioral/Psych: Denies anxiety, disturbance in thought content, and mood instability  Physical Exam:  Value Min Max  Temp 97.3 F (36.3 C)Abnormal  98.7 F (37.1 C)  Pulse Rate 75 103Abnormal   Resp 15 18  BP: Systolic 98 [id number]   BP: Diastolic 64 77  SpO2 98 % 100 %   KPS: 70. General: Alert, cooperative, pleasant, in no acute distress Head: Craniotomy scar noted, dry and intact. EENT: No conjunctival injection or scleral icterus. Oral mucosa moist Lungs: Resp effort normal Cardiac: Regular rate and rhythm Abdomen: Soft, non-distended abdomen Skin: No rashes cyanosis or petechiae. Extremities: No clubbing or edema  Neurologic Exam: Mental Status: Awake, alert, attentive to examiner. Oriented to self and environment. Language is fluent with intact comprehension.  Cranial Nerves: Visual acuity is grossly normal. Visual fields are full. Extra-ocular movements intact. No ptosis. Face is symmetric, tongue midline. Motor: Tone and bulk are normal. Power is 4+/5 in left arm and leg, with impaired fine coordination in left hand. Reflexes are symmetric, no pathologic reflexes present. Intact finger to nose bilaterally Sensory: Intact to light touch and temperature Gait: Deferred  Labs: I have reviewed the data as listed    Component Value Date/Time   NA 130 (L) 03/28/2018 0641   K 4.6 03/28/2018 0641   CL 99 03/28/2018 0641   CO2 23 03/28/2018 0641   GLUCOSE 166 (H) 03/28/2018 0641   BUN 27 (H) 03/28/2018 0641   CREATININE 0.95 03/28/2018 0641   CALCIUM 8.4 (L) 03/28/2018 0641   PROT 5.2 (L) 03/14/2018 0535   ALBUMIN 2.7 (L) 03/14/2018 0535   AST 18 03/14/2018 0535   ALT 29 03/14/2018 0535   ALKPHOS 34 (L) 03/14/2018 0535   BILITOT 0.5 03/14/2018 0535   GFRNONAA >60 03/28/2018 0641   GFRAA >60 03/28/2018 2831  Lab Results  Component Value Date   WBC 6.6 03/28/2018   NEUTROABS 8.3 (H) 03/14/2018   HGB 11.8 (L) 03/28/2018   HCT  35.2 (L) 03/28/2018   MCV 92.6 03/28/2018   PLT 163 03/28/2018    Imaging:  Ct Head Wo Contrast  Result Date: 03/13/2018 CLINICAL DATA:  Seizure.  Craniotomy for mass 03/07/2018. EXAM: CT HEAD WITHOUT CONTRAST TECHNIQUE: Contiguous axial images were obtained from the base of the skull through the vertex without intravenous contrast. COMPARISON:  MRI 02/16/2018.  CT 02/15/2018. 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 Some fluid and blood products in the post resection cavity as expected. Small amount of subdural fluid, air and blood at the site of the craniotomy as expected. Mild mass effect with right-to-left shift of 4 mm as seen previously. No unexpected hemorrhage or mass effect. No sign ischemic infarction. No hydrocephalus. Vascular: There is atherosclerotic calcification of the major vessels at the base of the brain. Skull: Right pterional craniotomy as above. Sinuses/Orbits: Chronic left maxillary sinusitis. Other sinuses are clear. Orbits are negative. 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 Expected postoperative findings. Mild persistent mass effect with right-to-left shift of 4 mm. Electronically Signed   By: Nelson Chimes M.D.   On: 03/13/2018 20:17    Pathology: Glioblastoma IDH-1 wild-type (per outside record)   Assessment/Plan 1.  Glioblastoma, IDH-wildtype [facility])   Mr. Chalk presents today with noted clinical improvement of left sided motor dysfunction following gross total resection of Glioblastoma.  Today we extensively discussed his pathology and treatment options moving forward.    We recommended initiating treatment with a 6 week course of intensity-modulated radiation therapy, along with concurrent daily Temozolomide dosed at 20m/m2.  We reviewed side effects of Temodar, including fatigue, nasea/vomiting,  constipation, and cytopenias.  Zofran will prescribed for home use for nausea/vomiting.   Chemotherapy should be held for the following:  ANC less than 1,000  Platelets less than 100,000  LFT or creatinine greater than 2x ULN  If clinical concerns/contraindications develop  He will return to clinic during weeks 2,4, and 6 of radiation with labs for evaluation.  He should continue Keppra 10034mBID for seizure prevention.  They would like to consult with [place] of radiation and post-radiation MRI scan.  We appreciate the opportunity to participate in the care of EdBerton [name]  We spent twenty additional minutes teaching regarding the natural history, biology, and historical experience in the treatment of brain tumors. We then discussed in detail the current recommendations for therapy focusing on the mode of administration, mechanism of action, anticipated toxicities, and quality of life issues associated with this plan. We also provided teaching sheets for the patient to take home as an additional resource.  All questions were answered. The patient knows to call the clinic with any problems, questions or concerns. No barriers to learning were detected.  The total time spent in the encounter was 45 minutes and more than 50% was on counseling and review of test results   ZaVentura SellersMD Medical Director of Neuro-Oncology [facility]1/07/20 1:28 PM

## 2018-03-29 NOTE — Progress Notes (Signed)
See progress note under physician encounter. 

## 2018-03-29 NOTE — Telephone Encounter (Signed)
Oral Oncology Pharmacist Encounter  Received new prescription for Temodar (temozolomide) for the treatment of glioblastoma multiforme in conjunction with radiation, planned duration 6 weeks of concurrent chemoradiation phase.  Planned start date of concurrent phase 04/04/2018  Temodar may be continued for an additional 6-12 cycles for adjuvant maintenance treatment after the completion of concurrent chemoradiation phase.  During concurrent chemoradiation Temodar will be administered at ~75 mg/m2 once daily for 42 days straight. Dose and frequency of Temodar will change if continued for adjuvant treatment.  Labs from Epic assessed, [place] for treatment.  Current medication list in Epic reviewed, no DDIs with Temodar identified.  Prescription has been e-scribed to the [facility] for benefits analysis and approval.  Oral Oncology Clinic will continue to follow for insurance authorization, copayment issues, initial counseling and start date.  Johny Drilling, PharmD, BCPS, BCOP  03/29/2018 2:52 PM Oral Oncology Clinic [phone]

## 2018-03-30 ENCOUNTER — Inpatient Hospital Stay (HOSPITAL_COMMUNITY): Payer: Medicare Other | Admitting: *Deleted

## 2018-03-30 ENCOUNTER — Other Ambulatory Visit: Payer: Self-pay | Admitting: *Deleted

## 2018-03-30 ENCOUNTER — Telehealth: Payer: Self-pay

## 2018-03-30 ENCOUNTER — Inpatient Hospital Stay (HOSPITAL_COMMUNITY): Payer: Medicare Other | Admitting: Speech Pathology

## 2018-03-30 ENCOUNTER — Inpatient Hospital Stay (HOSPITAL_COMMUNITY): Payer: Medicare Other

## 2018-03-30 LAB — BASIC METABOLIC PANEL
Anion gap: 7 (ref 5–15)
BUN: 28 mg/dL — ABNORMAL HIGH (ref 8–23)
CO2: 26 mmol/L (ref 22–32)
Calcium: 8.8 mg/dL — ABNORMAL LOW (ref 8.9–10.3)
Chloride: 99 mmol/L (ref 98–111)
Creatinine, Ser: 0.97 mg/dL (ref 0.61–1.24)
GFR calc Af Amer: >60 mL/min (ref >60)
GFR calc non Af Amer: >60 mL/min (ref >60)
Glucose, Bld: 116 mg/dL — ABNORMAL HIGH (ref 70–99)
Potassium: 4.7 mmol/L (ref 3.5–5.1)
Sodium: 132 mmol/L — ABNORMAL LOW (ref 135–145)

## 2018-03-30 LAB — GLUCOSE, CAPILLARY
GLUCOSE-CAPILLARY: 110 mg/dL — AB (ref 70–99)
Glucose-Capillary: 160 mg/dL — ABNORMAL HIGH (ref 70–99)
Glucose-Capillary: 171 mg/dL — ABNORMAL HIGH (ref 70–99)
Glucose-Capillary: 144 mg/dL — ABNORMAL HIGH (ref 70–99)

## 2018-03-30 NOTE — Progress Notes (Signed)
Occupational Therapy Session Note  Patient Details  Name: Bryan [name] MRN: [id number] Date of Birth: [birth year]  Today's Date: 03/30/2018 OT Individual Time: 5329-9242 OT Individual Time Calculation (min): 60 min    Short Term Goals: Week 2:  OT Short Term Goal 1 (Week 2): Pt will use L UE for 50% of bathing tasks using velcro wash cloth OT Short Term Goal 2 (Week 2): Pt will maintain standing balance within BADL tasks with no more than min A OT Short Term Goal 3 (Week 2): Pt will complete toilet transfer with consistent min A  Skilled Therapeutic Interventions/Progress Updates:    Pt received supine in bed, no c/o pain. Pt required HOH assistance to open containers with bimanual UE use to set up breakfast tray. Pt completed SPT from EOB to w/c with CGA. Pt sat prematurely in chair and required mod A to reposition. Pt completed UB bathing at sink with set up of L mitt and encouragement to use L UE. Pt completed LB bathing sit <> stand from w/c. No physical A required for UB or LB other than CGA for standing balance support. Min A HOH to set up L UE propped on sink to apply deodorant, min cueing.  Min A to pull up pants in standing on L side. Pt completed shaving task seated with set up assist. Min HOH to apply toothpaste to toothbrush and pt able to brush teeth with no physical assist. Pt was left sitting up in w/c with chair alarm belt fastened and all needs met.    Therapy Documentation Precautions:  Precautions Precautions: Fall Precaution Comments: expressive aphasia, L hemiplgia, impulsive Restrictions Weight Bearing Restrictions: No    Vital Signs: Therapy Vitals Temp: 98.3 F (36.8 C) Temp Source: Oral Pulse Rate: 79 Resp: 18 BP: 124/80 Patient Position (if appropriate): Lying Oxygen Therapy SpO2: 96 % O2 Device: Room Air Pain: Pain Assessment Pain Scale: 0-10 Pain Score: 0-No pain   Therapy/Group: Individual Therapy  Curtis Sites 03/30/2018, 7:51 AM

## 2018-03-30 NOTE — Telephone Encounter (Signed)
Per 1/7 no los 

## 2018-03-30 NOTE — Evaluation (Signed)
Recreational Therapy Assessment and Plan  Patient Details  Name: Bryan [name] MRN: [id number] Date of Birth: Jan 03, 1941 Today's Date: 03/30/2018  Rehab Potential: Good ELOS: discharge 1/15   Assessment  Problem List:      Patient Active Problem List   Diagnosis Date Noted  . Brain tumor ([place]) 03/11/2018  . Postoperative pain   . Seizure prophylaxis   . Prediabetes   . Dyslipidemia   . Brain mass 02/16/2018  . Diabetes mellitus without complication ([place])   . Hypercholesterolemia   . Hypertension     Past Medical History:      Past Medical History:  Diagnosis Date  . Arthritis   . Diabetes mellitus without complication ([place])   . GERD (gastroesophageal reflux disease)   . Hypercholesterolemia   . Hypertension   . Thyroid disease    Past Surgical History:       Past Surgical History:  Procedure Laterality Date  . HERNIA REPAIR      Assessment & Plan Clinical Impression: Patient is a 78 year old right handed male history of pre-diabetes, hypothyroidism hypertension.History taken from chart review and wife. 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 He was transferred to [facility] for evaluation and interventionon the referral of Dr. Mickeal Skinner and Dr. Tammi Klippel. CT and imaging by report showed a right insular lesion. Initially placed on Decadron therapy. He underwent a arteriogram that showed a hypervascular right mass supplied by the lenticulostriate arteries. 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 [id number]([phone]) 03/07/2018. Maintained on Keppra for seizure prophylaxis. Postoperative course with noted dysarthria and facial weakness.  Patient transferred to CIR on 03/11/2018.   Pt presents with decreased  activity tolerance, decreased functional mobility, decreased balance, left inattention, decreased attention, decreased problem solving, decreased awareness, decreased memory Limiting pt's independence with leisure/community pursuits.  Leisure History/Participation Premorbid leisure interest/current participation: Medical laboratory scientific officer - Building control surveyor - Doctor, hospital - Travel (Comment);Nature - Fishing;Games - Cards;Sports - Exercise (Comment)(playing/chasing young grand kids) Leisure Participation Style: With Family/Friends Awareness of Community Resources: Good-identify 3 post discharge leisure resources Psychosocial / Spiritual Does patient have pets?: Yes Social interaction - Mood/Behavior: Cooperative Academic librarian Appropriate for Education?: Yes Patient Agreeable to Outing?: Yes Strengths/Weaknesses Patient weaknesses: Physical limitations TR Patient demonstrates impairments in the following area(s): Endurance;Motor;Safety  Plan Rec Therapy Plan Is patient appropriate for Therapeutic Recreation?: Yes Rehab Potential: Good Treatment times per week: Min 1 TR session >45 minutes for community reintegraiton during LOS Estimated Length of Stay: discharge 1/15 TR Treatment/Interventions: Adaptive equipment instruction;Cognitive remediation/compensation;Community reintegration;Functional mobility training;Patient/family education;Therapeutic activities  Recommendations for other services: None   Discharge Criteria: Patient will be discharged from TR if patient refuses treatment 3 consecutive times without medical reason.  If treatment goals not met, if there is a change in medical status, if patient makes no progress towards goals or if patient is discharged from hospital.  The above assessment, treatment plan, treatment alternatives and goals were discussed and mutually agreed upon: by patient  [place] 03/30/2018, 9:27 AM

## 2018-03-30 NOTE — Telephone Encounter (Signed)
Oral Oncology Patient Advocate Encounter  [employer] denied the PA stating that Temodar needs to be billed to medicare part B. There is not a part b card on file and I cannot get one to come up in eligibility check. I called the patients wife to get this information and had to leave a voicemail.  [place] Patient [place] Phone [phone] Fax [phone]

## 2018-03-30 NOTE — Progress Notes (Signed)
Patient BP is ranging in 39Q systolic over 30S. Checked q30 minutes for 2 hours. Pt stats normal, pulse 88 and O2 98%. Continuing to monitor BP. Patient is stating he feels good. Sitting up in bed eating. Doy Hutching, LPN

## 2018-03-30 NOTE — Progress Notes (Signed)
[place] PHYSICAL MEDICINE & REHABILITATION PROGRESS NOTE  Subjective/Complaints: Pt up in bed chomping down his breakfast. Won't engage his left hand to help however.   ROS: limited due to language/communication    Objective: Vital Signs: Blood pressure 124/80, pulse 79, temperature 98.3 F (36.8 C), temperature source Oral, resp. rate 18, height 5\' 5"  (1.651 m), weight 78.2 kg, SpO2 96 %. No results found. Recent Labs    03/28/18 0641  WBC 6.6  HGB 11.8*  HCT 35.2*  PLT 163   Recent Labs    03/28/18 0641 03/30/18 0534  NA 130* 132*  K 4.6 4.7  CL 99 99  CO2 23 26  GLUCOSE 166* 116*  BUN 27* 28*  CREATININE 0.95 0.97  CALCIUM 8.4* 8.8*    Physical Exam: BP 124/80   Pulse 79   Temp 98.3 F (36.8 C) (Oral)   Resp 18   Ht 5\' 5"  (1.651 m)   Wt 78.2 kg   SpO2 96%   BMI 28.70 kg/m  Constitutional: No distress . Vital signs reviewed. HEENT: EOMI, oral membranes moist, scalp incision healed Neck: supple Cardiovascular: RRR without murmur. No JVD    Respiratory: CTA Bilaterally without wheezes or rales. Normal effort    GI: BS +, non-tender, non-distended  Musc: No edema or tenderness in extremities. Neurological: He is alert Improving word findings. Still non-fluent but improving with automatic language Motor: Left upper extremity: Shoulder abduction, elbow flexion/extension 3-4/5, handgrip   3 to 4/5--improving but won't initiate use of it.  Left lower extremity: 3 to+/5 proximal distally --stable exam Skin: Skin is warm and dry.  Scalp wound CDI Psychiatric: in good spirits  Assessment/Plan: 1. 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  Physiatrist is providing close team supervision and 24 hour management of active medical problems listed below.  Physiatrist and rehab team continue to assess barriers to  discharge/monitor patient progress toward functional and medical goals  Care Tool:  Bathing    Body parts bathed by patient: Chest, Abdomen, Front perineal area, Right upper leg, Left upper leg, Face, Left arm, Right lower leg, Left lower leg, Right arm, Buttocks   Body parts bathed by helper: Buttocks     Bathing assist Assist Level: Contact Guard/Touching assist     Upper Body Dressing/Undressing Upper body dressing   What is the patient wearing?: Pull over shirt    Upper body assist Assist Level: Contact Guard/Touching assist    Lower Body Dressing/Undressing Lower body dressing      What is the patient wearing?: Underwear/pull up, Pants     Lower body assist Assist for lower body dressing: Contact Guard/Touching assist     Toileting Toileting    Toileting assist Assist for toileting: Minimal Assistance - Patient > 75%     Transfers Chair/bed transfer  Transfers assist     Chair/bed transfer assist level: Contact Guard/Touching assist     Locomotion Ambulation   Ambulation assist      Assist level: Minimal Assistance - Patient > 75% Assistive device: Walker-rolling(L hand orthosis) Max distance: 80 ft    Walk 10 feet activity   Assist     Assist level: Minimal Assistance - Patient > 75% Assistive device: Walker-rolling(L hand orthosis)   Walk 50 feet activity   Assist    Assist level: Minimal Assistance - Patient > 75% Assistive device: Walker-rolling(L hand orthosis)  Walk 150 feet activity   Assist    Assist level: Minimal Assistance - Patient > 75% Assistive device: Walker-rolling    Walk 10 feet on uneven surface  activity   Assist Walk 10 feet on uneven surfaces activity did not occur: Safety/medical concerns         Wheelchair     Assist Will patient use wheelchair at discharge?: Yes Type of Wheelchair: Manual    Wheelchair assist level: Supervision/Verbal cueing Max wheelchair distance: 150 ft(BLE)     Wheelchair 50 feet with 2 turns activity    Assist        Assist Level: Supervision/Verbal cueing   Wheelchair 150 feet activity     Assist     Assist Level: Supervision/Verbal cueing      Medical Problem List and Plan: 1.  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 Status post craniotomy 03/07/2018. Patient was to follow up with Dr.Vaslow and Dr. Tammi Klippel on discharge.   -Continue CIR therapies including PT, OT, and SLP   Decadron discontinued 2.  DVT Prophylaxis/Anticoagulation: SCDs. 3. Pain Management: Tylenol as needed 4. Mood:  Provide emotional support 5. Neuropsych: This patient is not capable of making decisions on his own behalf. 6. Skin/Wound Care:  Routine skin checks    7. Fluids/Electrolytes/Nutrition:  Routine in and out's   -BUN increased sl, continue to push p.o. fluids 8. Seizures.   Keppra maintained at 1500 twice daily  Consider addition of Vimpat if persistent breakthrough seizures  Controlled at present 9. Hypertension.   Lisinopril increased to 15 mg on 12/24  bp's improved with decreasing steroids.  Vitals:   03/30/18 0536 03/30/18 0727  BP: 121/77 124/80  Pulse: 79   Resp: 18   Temp: 98.3 F (36.8 C)   SpO2: 96%   Well controlled 1/8 10.  Steroid-induced hyperglycemia on prediabetes. Glucophage 1000 mg twice a day.  Off steroids.      CBG (last 3)  Recent Labs    03/29/18 1624 03/29/18 2151 03/30/18 0633  GLUCAP 215* 113* 110*  Blood sugars had been improving until yesterday. Follow for pattern as otherwise they have been under control 11. Hyperlipidemia. Lipitor 12.  Hyponatremia   Sodium 133 12/30---> sodium 130 on 1/6--> 132 1/8  -recheck tomorrow     13.  Hypoalbuminemia  Supplement initiated on 12/23 14.  Acute blood loss anemia  Hemoglobin 11.8 on 1/6    15. Borderline K+  4.7 on 1/8     LOS: 19 days A FACE TO [place] 03/30/2018, 9:01 AM

## 2018-03-30 NOTE — Progress Notes (Signed)
Physical Therapy Session Note  Patient Details  Name: Bryan [name] MRN: [id number] Date of Birth: 06/01/1940  Today's Date: 03/30/2018 PT Individual Time: 0900-1020 PT Individual Time Calculation (min): 80 min   Short Term Goals:  Week 3:  PT Short Term Goal 1 (Week 3): = LTG      Skilled Therapeutic Interventions/Progress Updates:   Co treatment with with Lattie Haw, Therapeutic Therapist from 0900-1000.  Pt seated in w/c.    Stand pivot w/c > K[place] with min guard assist.   neuromuscular re-education via forced use, multimodal cues for alternating reciprocal movement bil LEs in sitting x 25 cycles, in standing x 10 cycles (with incomplete L knee extension) x 1, at 40 cm/sec.  Also pt stood with full wt bearing LLE, working on L knee stability and L trunk elongation.  Pt able to extend L knee with multimodal cues.  Gait training with RW on level tile x 35' with min assist for wt shifting due to poor clearance LLE.  Advanced gait training x 40' x 2 iwht RW to weave in/out of chair obstacle course.  Pt had good attention to obstacles, but needed min cues to slow down; min assist for wt shifting to clear L foot.  PT discussed with pt what type of shoes would be most helpful for now, and recommended walking style shoes.  Pt agreed that hiking boots are heavy.  His present athletic shoes have so much tread on the sole that his foot catches the floor more, per pt.   Therapeutic activity in standing iwht intermittent 1UE support for reaching within BOS with R/L hands to retrieve items.  Wt shifting promotes fair L knee response .  In standing, pt retrieved 6 items from the floor with mod assist for balance, RW for L hand support. In standing with RUE support, pt kicked large rings using LLE.  Pt elicited L hamstrings and quadriceps; little hip flexion noted.  Pt needed several seated rest breaks throughout activities.  Seated activities to target L wrist extension and gross grasp: grasping Rx bottles  with L hand and removing caps with R, and rolling a ball across table in front of him, using the back of his hand.  Pt left resting in w/c with seat belt alarm set and needs at hand.  Pt was practicing using bil hands on fishing reel.     Therapy Documentation Precautions:  Precautions Precautions: Fall Precaution Comments: expressive aphasia, L hemiplgia, impulsive Restrictions Weight Bearing Restrictions: No  Pain: Pain Assessment Pain Scale: 0-10 Pain Score: 0-No pain     Therapy/Group: Individual Therapy  Maciel Kegg 03/30/2018, 10:40 AM

## 2018-03-30 NOTE — Progress Notes (Signed)
Speech Language Pathology Daily Session Note  Patient Details  Name: Bryan [name] MRN: [id number] Date of Birth: [birth year]  Today's Date: 03/30/2018 SLP Individual Time: 1300-1330 SLP Individual Time Calculation (min): 30 min  Missed Time: 30 mins, fatigue   Short Term Goals: Week 3: SLP Short Term Goal 1 (Week 3): Pt will self-monitor and self-correct verbal errors in 7 out of 10 opportunities with Mod A verbal cues.  SLP Short Term Goal 2 (Week 3): Pt will read at the sentence level with ~ 75% accuracy and Mod A verbal cues to self-monitor and correct errors.  SLP Short Term Goal 3 (Week 3): Patient will write at the phrase level with 75% accuracy and Mod A verbal and visual cues to self-monitor and correct errors.  SLP Short Term Goal 4 (Week 3): Patient will express basic wants/needs at the phrase level with approrpiate syntax with Mod A verbal cues and 75% accuracy.  SLP Short Term Goal 5 (Week 3): Pt will complete basic familiar tasks with supervision A verbal cues.   Skilled Therapeutic Interventions: Skilled treatment session focused on speech goals. Upon arrival, patient appeared lethargic and requested to get back into bed. Patient agreeable to participate with encouragement as long as its" short and easy." SLP facilitated session by providing extra time and supervision phonemic cues for word-finding while naming pictures of objects and Min A verbal cues to compare/contrast 2 items at the sentence level. Verbal cues needed for patient to slow down in order to utilize verbal expression instead of gestures/sounds in order to describe things due to impulsivity. Patient transferred back to bed and missed remaninig 30 minutes of session.  Patient left supine in bed with alarm on. Continue with current plan of care.      Pain No/Denies Pain   Therapy/Group: Individual Therapy  Jeanne Diefendorf 03/30/2018, 1:39 PM

## 2018-03-30 NOTE — Telephone Encounter (Signed)
Oral Oncology Patient Advocate Encounter  Received notification from [facility] that prior authorization for Temodar is required.  PA submitted on CoverMyMeds Key [id number] Status is pending  Oral Oncology Clinic will continue to follow.  [place] Patient [place] Phone [phone] Fax [phone]

## 2018-03-30 NOTE — Progress Notes (Signed)
Occupational Therapy Weekly Progress Note  Patient Details  Name: Bryan [name] MRN: [id number] Date of Birth: [birth year]  Beginning of progress report period: March 12, 2018 End of progress report period: March 30, 2018  Patient has met 3 of 3 short term goals. Pt is making steady progress with OT treatments at this time.  He is able to complete all transfers with use of the RW and min guard assist.  LUE function continues to improve to a min A level for bathing and dressing tasks.  Supervision for sit to to stand with min instructional cueing for hand placement.  Min assist for stand pivot transfers without assistive device.  Feel he is on target to reach supervision level goals for discharge home next week.  Will continue with current OT treatment POC.    Patient continues to demonstrate the following deficits: muscle weakness, impaired timing and sequencing, abnormal tone, unbalanced muscle activation, ataxia, decreased coordination and decreased motor planning and decreased sitting balance, decreased standing balance, decreased postural control, hemiplegia and decreased balance strategies and therefore will continue to benefit from skilled OT intervention to enhance overall performance with BADL.  Patient progressing toward long term goals..  Continue plan of care.  OT Short Term Goals Week 2:  OT Short Term Goal 1 (Week 2): Pt will use L UE for 50% of bathing tasks using velcro wash cloth OT Short Term Goal 1 - Progress (Week 2): Met OT Short Term Goal 2 (Week 2): Pt will maintain standing balance within BADL tasks with no more than min A OT Short Term Goal 2 - Progress (Week 2): Met OT Short Term Goal 3 (Week 2): Pt will complete toilet transfer with consistent min A OT Short Term Goal 3 - Progress (Week 2): Met Week 3:  OT Short Term Goal 1 (Week 3): LTG=STG 2/2 ELOS  Therapy/Group: Individual Therapy  Valma Cava 03/30/2018, 8:40 PM

## 2018-03-31 ENCOUNTER — Inpatient Hospital Stay (HOSPITAL_COMMUNITY): Payer: Medicare Other | Admitting: Occupational Therapy

## 2018-03-31 ENCOUNTER — Inpatient Hospital Stay (HOSPITAL_COMMUNITY): Payer: Medicare Other | Admitting: Speech Pathology

## 2018-03-31 ENCOUNTER — Encounter (HOSPITAL_COMMUNITY): Payer: Medicare Other | Admitting: *Deleted

## 2018-03-31 LAB — GLUCOSE, CAPILLARY
Glucose-Capillary: 122 mg/dL — ABNORMAL HIGH (ref 70–99)
Glucose-Capillary: 126 mg/dL — ABNORMAL HIGH (ref 70–99)
Glucose-Capillary: 127 mg/dL — ABNORMAL HIGH (ref 70–99)
Glucose-Capillary: 120 mg/dL — ABNORMAL HIGH (ref 70–99)

## 2018-03-31 NOTE — Telephone Encounter (Signed)
Oral Oncology Pharmacist Encounter  Spoke with patient's daughter, Cloyde Reams. She will be in touch with her mom and then contact oral oncology clinic with Medicare information and medicare supplement information if they have one. We will follow-up with the [facility] with Temodar acquisition information once it is processed at the pharmacy.  Johny Drilling, PharmD, BCPS, BCOP  03/31/2018 4:02 PM Oral Oncology Clinic [phone]

## 2018-03-31 NOTE — Progress Notes (Signed)
Occupational Therapy Session Note  Patient Details  Name: Bryan [name] MRN: [id number] Date of Birth: 11/04/1940  Today's Date: 03/31/2018 OT Individual Time: 1000-1030 OT Individual Time Calculation (min): 30 min   Short Term Goals: Week 3:  OT Short Term Goal 1 (Week 3): LTG=STG 2/2 ELOS  Skilled Therapeutic Interventions/Progress Updates:    Pt greeted seated in wc and agreeable to OT Treatment session focused on L UE NMR. Utilized medium/small foam blocks to work on Equities trader. Focus on normal movement patterns and maintaining wrist extension with grasp/release. Facilitation for normal movement patterns at shoulder/elbow/wrist. Pt handoff to Rec Therapist for outing today. No reports of pain throughout session.   Therapy Documentation Precautions:  Precautions Precautions: Fall Precaution Comments: expressive aphasia, L hemiplgia, impulsive Restrictions Weight Bearing Restrictions: No Pain: Pain Assessment Pain Scale: 0-10 Pain Score: 0-No pain  Therapy/Group: Individual Therapy  Valma Cava 03/31/2018, 10:33 AM

## 2018-03-31 NOTE — Progress Notes (Signed)
Speech Language Pathology Daily Session Note  Patient Details  Name: Bryan [name] MRN: [id number] Date of Birth: 1940/11/26  Today's Date: 03/31/2018  Session 1: SLP Individual Time: 0930-1000 SLP Individual Time Calculation (min): 30 min   Session 2: SLP Individual Time: 4259-5638 SLP Individual Time Calculation (min): 55 min  Short Term Goals: Week 3: SLP Short Term Goal 1 (Week 3): Pt will self-monitor and self-correct verbal errors in 7 out of 10 opportunities with Mod A verbal cues.  SLP Short Term Goal 2 (Week 3): Pt will read at the sentence level with ~ 75% accuracy and Mod A verbal cues to self-monitor and correct errors.  SLP Short Term Goal 3 (Week 3): Patient will write at the phrase level with 75% accuracy and Mod A verbal and visual cues to self-monitor and correct errors.  SLP Short Term Goal 4 (Week 3): Patient will express basic wants/needs at the phrase level with approrpiate syntax with Mod A verbal cues and 75% accuracy.  SLP Short Term Goal 5 (Week 3): Pt will complete basic familiar tasks with supervision A verbal cues.   Skilled Therapeutic Interventions:  Session 1: Skilled treatment session focused on cognitive goals. Upon arrival, patient upright in the wheelchair and requesting assistance to get dressed. SLP facilitated session by providing extra time and overall supervision verbal cues for safety with task, however, patient independently utilized hemi dressing techniques. Patient verbalized wants/needs at the sentence level with intermittent supervision level verbal cues needed for word-finding. Patient left upright in wheelchair with all needs within reach. Continue with current plan of care.   Session 2: Skilled treatment session focused on speech goals. SLP administered RMT testing. Patient's peak MIP was 22 cm H2O which is below the average range of 88-50 cm H2O and patient's peak MEP was 39 cm H2O which is also below the average range of 111-54 cm H2O for  someone of patient's gender and age. SLP introduced IMST and EMST devices. Patient required Max A multimodal cues to perform IMST exercises accurately at 15 cm H2O, however, performed EMST exercises with only supervision level verbal cues with a self-perceived effort level of 8/10 at 20 cm H2O. Patient transferred back to bed at end of session and left supine with alarm on and all needs within reach. Continue with current plan of care.    Pain  Session 1: No/Denies Pain  Session 2: No/Denies Pain   Therapy/Group: Individual Therapy  Bryan [name] 03/31/2018, 12:42 PM

## 2018-03-31 NOTE — Progress Notes (Signed)
Orthopedic Tech Progress Note Patient Details:  Bryan [name] Jun 11, 1940 [id number]  Patient ID: Bryan [name], male   DOB: 29-Dec-1940, 78 y.o.   MRN: [id number] Called in order to [place] 03/31/2018, 9:14 AM

## 2018-03-31 NOTE — Progress Notes (Signed)
Physical Therapy Session Note  Patient Details  Name: Other Atienza MRN: [id number] Date of Birth: 10-11-1940  Today's Date: 03/31/2018 PT Concurrent Time: 1030-1200 PT Concurrent Time Calculation (min): 90 min  Short Term Goals: Week 3:  PT Short Term Goal 1 (Week 3): = LTG  Skilled Therapeutic Interventions/Progress Updates:    pt participated in community outing to sporting goods store. Pt performs stairs with mod/max A to [employer]. Gait in community setting with min A, min cues for attention to task. Pt performs w/c mobility with supervision/min A throughout store with cues for Lt attention.  Discussed energy conservation, safety and resolutions to barriers in a community setting with pt verbalizing understanding. See shadow chart for details.  Therapy Documentation Precautions:  Precautions Precautions: Fall Precaution Comments: expressive aphasia, L hemiplgia, impulsive Restrictions Weight Bearing Restrictions: No Pain:  no c/o pain   Therapy/Group: concurrent therapy  Cova Knieriem 03/31/2018, 12:50 PM

## 2018-03-31 NOTE — Progress Notes (Signed)
Recreational Therapy Session Note  Patient Details  Name: Bryan [name] MRN: [id number] Date of Birth: 1940/05/28 Today's Date: 03/31/2018 Time:  1030-1215 Pain: no c/o Skilled Therapeutic Interventions/Progress Updates: Pt participated in community reintegration/outing to [employer] at [employer] assist ambulatory and w/c levels, min-mod verbal cues to attend to the left.    Goals focused on safe community mobility, identification & negotiation of obstacles, accessing public restroom, energy conservation techniques/education.  See outing goal sheet in shadow chart for full details.   Therapy/Group: [employer]   Shell Yandow 03/31/2018, 1:07 PM

## 2018-03-31 NOTE — Patient Care Conference (Signed)
Inpatient RehabilitationTeam Conference and Plan of Care Update Date: 03/29/2018   Time: 2:30 PM    Patient Name: Bryan [name]      Medical Record Number: [id number]  Date of Birth: 10-Aug-1940 Sex: Male         Room/Bed: 4W13C/4W13C-01 Payor Info: Payor: MEDICARE / Plan: MEDICARE PART A AND B / Product Type: *No Product type* /    Admitting Diagnosis: Brain Tumor resection  Admit Date/Time:  03/11/2018 11:41 AM Admission Comments: No comment available   Primary Diagnosis:  <principal problem not specified> Principal Problem: <principal problem not specified>  Patient Active Problem List   Diagnosis Date Noted  . Brain neoplasm ([place]) 03/28/2018  .  Glioblastoma, IDH-wildtype ([place])  03/28/2018  . Acute blood loss anemia   . Seizures ([place])   . Hypoalbuminemia due to protein-calorie malnutrition ([place])   . Hyponatremia   . Steroid-induced hyperglycemia   . Brain tumor ([place]) 03/11/2018  . Postoperative pain   . Seizure prophylaxis   . Prediabetes   . Dyslipidemia   . S/P craniotomy 03/08/2018  . Brain mass 02/16/2018  . Hypertension   . Type 2 diabetes mellitus without complication, without long-term current use of insulin ([place]) 01/16/2015  . Hyperlipidemia 04/11/2014  . Inguinal hernia 08/10/2013    Expected Discharge Date: Expected Discharge Date: 04/06/18  Team Members Present: Physician leading conference: Dr. Alger Simons Social Worker Present: Lennart Pall, LCSW Nurse Present: Isla Pence, RN PT Present: Roderic Ovens, PT OT Present: Cherylynn Ridges, OT SLP Present: Weston Anna, SLP PPS Coordinator present : Gunnar Fusi     Current Status/Progress Goal Weekly Team Focus  Medical   Patient with ongoing left-sided weakness and a aphasia although demonstrating improvements.  Sugars improving off of steroids.  Stabilized medically for discharge next week.  Blood glucose control, blood pressure control, managing electrolytes including hypo-natremia    Bowel/Bladder   continent of bowel and bladder, LBM 1-6  remain continent of bowel and bladder, maintain regular bowel patter  Assist with toileting needs prn   Swallow/Nutrition/ Hydration             ADL's   Min/CGA overall  Goals upgraded to all supervision  L NMR, functional transfers, bathing/dressing, activity tolerance, pt/family education   Mobility   min A transfers and gait with RW  supervision tranfsers and gait, min A stairs  family ed, d/c planning, NMR, gait   Communication   Min A   Min A  phonation and verbal expression at the sentence level    Safety/Cognition/ Behavioral Observations  Min A  Min A  problem solving, attention    Pain   c/o headache managed with prn tylenol 623m  <3  Assess pain q shift and prn   Skin   right crani incision OTA  no new skin infection/breakdown  Assess skin q shift and prn    Rehab Goals Patient on target to meet rehab goals: Yes *See Care Plan and progress notes for long and short-term goals.     Barriers to Discharge  Current Status/Progress Possible Resolutions Date Resolved   Physician    Medical stability        Continued medical management as above.  Educate family as well      Nursing                  PT                    OT  SLP                SW                Discharge Planning/Teaching Needs:  Plan to d/c home with wife who can provide 24/7 assistance.  Local daughter also supportive.  Teaching has been ongoing.   Team Discussion:  Medically improved and overall making very nice progress with all therapies.  Rad tx has begun this week.  Min assist with tfs and gait.  Need to add AFO for left foot.  Supervision goals.  Revisions to Treatment Plan:  NA    Continued Need for Acute Rehabilitation Level of Care: The patient requires daily medical management by a physician with specialized training in physical medicine and rehabilitation for the following conditions: Daily direction of a  multidisciplinary physical rehabilitation program to ensure safe treatment while eliciting the highest outcome that is of practical value to the patient.: Yes Daily medical management of patient stability for increased activity during participation in an intensive rehabilitation regime.: Yes Daily analysis of laboratory values and/or radiology reports with any subsequent need for medication adjustment of medical intervention for : Post surgical problems;Neurological problems;Diabetes problems   I attest that I was present, lead the team conference, and concur with the assessment and plan of the team.   Caley Volkert 03/31/2018, 2:01 PM

## 2018-03-31 NOTE — Progress Notes (Signed)
Social Work Patient ID: Bryan [name], male   DOB: 12-28-1940, 78 y.o.   MRN: [id number]   Have reviewed team conference with pt, wife and son and all aware that team continues to plan toward 1/15 discharge and supervision goals.  All pleased with progress. Discussed DME and planned follow up therapies.  Will continue to follow.  Tyanne Derocher, LCSW

## 2018-03-31 NOTE — Telephone Encounter (Signed)
Oral Chemotherapy Pharmacist Encounter   Attempted to reach patient to provide update and offer for initial counseling on oral medication: Temodar.  No answer. Left VM for patient's wife to call back.   Medicare Part B information is needed from family in order to continue processing Temodar at the pharmacy. This information is not available in the medical record for review. It does not appear insurance card was scanned into Epic during registration process.  Request on voicemail to contact oral oncology patient advocate as soon as possible so that we can determine copayment and any assistance that may be needed prior to planned start date on 04/04/2018.  Will plan to perform initial counseling once medication acquisition can be coordinated.  Johny Drilling, PharmD, BCPS, BCOP  03/31/2018   3:39 PM [facility] [phone]

## 2018-03-31 NOTE — Progress Notes (Signed)
[place] PHYSICAL MEDICINE & REHABILITATION PROGRESS NOTE  Subjective/Complaints: In good spirits. Getting himself set up for breakfast. Wanted help opening his salt. I asked him if he would try using his left hand. He pulled up left hand and utilized it to open both his salt AND pepper pouches!  ROS: limited due to language/communication    Objective: Vital Signs: Blood pressure 115/62, pulse 86, temperature 97.7 F (36.5 C), temperature source Oral, resp. rate 20, height 5\' 5"  (1.651 m), weight 78.2 kg, SpO2 97 %. No results found. No results for input(s): WBC, HGB, HCT, PLT in the last 72 hours. Recent Labs    03/30/18 0534  NA 132*  K 4.7  CL 99  CO2 26  GLUCOSE 116*  BUN 28*  CREATININE 0.97  CALCIUM 8.8*    Physical Exam: BP 115/62 (BP Location: Left Arm)   Pulse 86   Temp 97.7 F (36.5 C) (Oral)   Resp 20   Ht 5\' 5"  (1.651 m)   Wt 78.2 kg   SpO2 97%   BMI 28.70 kg/m  Constitutional: No distress . Vital signs reviewed. HEENT: EOMI, oral membranes moist Neck: supple Cardiovascular: RRR without murmur. No JVD    Respiratory: CTA Bilaterally without wheezes or rales. Normal effort    GI: BS +, non-tender, non-distended  Musc: No edema or tenderness in extremities. Neurological: He is alert Word finding gradually improving Motor: Left upper extremity: Shoulder abduction, elbow flexion/extension 3-4/5, handgrip   3 to 4/5--improving but won't initiate use of it.  Left lower extremity: 3 to+/5 proximal distally --needs visual cues to implement left hand into activities Skin: Skin is warm and dry.  Scalp wound CDI Psychiatric: in good spirits  Assessment/Plan: 1. 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  Physiatrist is providing close team supervision and 24 hour management of active medical problems listed  below.  Physiatrist and rehab team continue to assess barriers to discharge/monitor patient progress toward functional and medical goals  Care Tool:  Bathing    Body parts bathed by patient: Chest, Abdomen, Front perineal area, Right upper leg, Left upper leg, Face, Left arm, Right lower leg, Left lower leg, Right arm, Buttocks   Body parts bathed by helper: Buttocks     Bathing assist Assist Level: Contact Guard/Touching assist     Upper Body Dressing/Undressing Upper body dressing   What is the patient wearing?: Pull over shirt    Upper body assist Assist Level: Contact Guard/Touching assist    Lower Body Dressing/Undressing Lower body dressing      What is the patient wearing?: Underwear/pull up, Pants     Lower body assist Assist for lower body dressing: Contact Guard/Touching assist     Toileting Toileting    Toileting assist Assist for toileting: Minimal Assistance - Patient > 75%     Transfers Chair/bed transfer  Transfers assist     Chair/bed transfer assist level: Contact Guard/Touching assist     Locomotion Ambulation   Ambulation assist      Assist level: Minimal Assistance - Patient > 75% Assistive device: Walker-rolling Max distance: 80   Walk 10 feet activity   Assist     Assist level: Minimal Assistance - Patient > 75% Assistive device: Walker-rolling   Walk 50 feet activity   Assist    Assist level: Minimal Assistance - Patient > 75% Assistive device: Walker-rolling  Walk 150 feet activity   Assist    Assist level: Minimal Assistance - Patient > 75% Assistive device: Walker-rolling    Walk 10 feet on uneven surface  activity   Assist Walk 10 feet on uneven surfaces activity did not occur: Safety/medical concerns         Wheelchair     Assist Will patient use wheelchair at discharge?: Yes Type of Wheelchair: Manual    Wheelchair assist level: Supervision/Verbal cueing Max wheelchair distance:  150 ft(BLE)    Wheelchair 50 feet with 2 turns activity    Assist        Assist Level: Supervision/Verbal cueing   Wheelchair 150 feet activity     Assist     Assist Level: Supervision/Verbal cueing      Medical Problem List and Plan: 1.  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 Status post craniotomy 03/07/2018. Patient was to follow up with Dr.Vaslow and Dr. Tammi Klippel on discharge.   -Continue CIR therapies including PT, OT, and SLP   Decadron discontinued 2.  DVT Prophylaxis/Anticoagulation: SCDs. 3. Pain Management: Tylenol as needed 4. Mood:  Provide emotional support 5. Neuropsych: This patient is not capable of making decisions on his own behalf. 6. Skin/Wound Care:  Routine skin checks    7. Fluids/Electrolytes/Nutrition:  Routine in and out's   -BUN increased sl, continue to push p.o. fluids 8. Seizures.   Keppra maintained at 1500 twice daily  Consider addition of Vimpat if persistent breakthrough seizures  Controlled at present 9. Hypertension.   Lisinopril increased to 15 mg on 12/24  bp's improved with decreasing steroids.  Vitals:   03/30/18 2240 03/31/18 0533  BP: 112/63 115/62  Pulse: 86 86  Resp: 18 20  Temp: 97.7 F (36.5 C) 97.7 F (36.5 C)  SpO2: 95% 97%  Well controlled 1/9 10.  Steroid-induced hyperglycemia on prediabetes. Glucophage 1000 mg twice a day.  Off steroids.      CBG (last 3)  Recent Labs    03/30/18 1619 03/30/18 2157 03/31/18 0630  GLUCAP 171* 144* 122*  blood sugars improving -continue same regimen 11. Hyperlipidemia. Lipitor 12.  Hyponatremia   Sodium 133 12/30---> sodium 130 on 1/6--> 132 1/8        13.  Hypoalbuminemia  Supplement initiated on 12/23 14.  Acute blood loss anemia  Hemoglobin 11.8 on 1/6    15. Borderline K+  4.7 on 1/8     LOS: 20 days A FACE TO [place] 03/31/2018, 8:57 AM

## 2018-04-01 ENCOUNTER — Inpatient Hospital Stay (HOSPITAL_COMMUNITY): Payer: Medicare Other | Admitting: Occupational Therapy

## 2018-04-01 ENCOUNTER — Inpatient Hospital Stay (HOSPITAL_COMMUNITY): Payer: Medicare Other | Admitting: Physical Therapy

## 2018-04-01 ENCOUNTER — Inpatient Hospital Stay (HOSPITAL_COMMUNITY): Payer: Medicare Other | Admitting: Speech Pathology

## 2018-04-01 DIAGNOSIS — M25512 Pain in left shoulder: Secondary | ICD-10-CM | POA: Diagnosis not present

## 2018-04-01 DIAGNOSIS — Q043 Other reduction deformities of brain: Secondary | ICD-10-CM | POA: Diagnosis not present

## 2018-04-01 DIAGNOSIS — Z510 Encounter for antineoplastic radiation therapy: Secondary | ICD-10-CM | POA: Diagnosis not present

## 2018-04-01 DIAGNOSIS — Z923 Personal history of irradiation: Secondary | ICD-10-CM | POA: Diagnosis not present

## 2018-04-01 DIAGNOSIS — C711 Malignant neoplasm of frontal lobe: Secondary | ICD-10-CM | POA: Diagnosis not present

## 2018-04-01 DIAGNOSIS — I10 Essential (primary) hypertension: Secondary | ICD-10-CM | POA: Diagnosis not present

## 2018-04-01 DIAGNOSIS — R112 Nausea with vomiting, unspecified: Secondary | ICD-10-CM | POA: Diagnosis not present

## 2018-04-01 LAB — GLUCOSE, CAPILLARY
Glucose-Capillary: 118 mg/dL — ABNORMAL HIGH (ref 70–99)
Glucose-Capillary: 123 mg/dL — ABNORMAL HIGH (ref 70–99)
Glucose-Capillary: 126 mg/dL — ABNORMAL HIGH (ref 70–99)
Glucose-Capillary: 131 mg/dL — ABNORMAL HIGH (ref 70–99)

## 2018-04-01 NOTE — Progress Notes (Signed)
Occupational Therapy Session Note  Patient Details  Name: Bryan [name] MRN: [id number] Date of Birth: 06-19-1940  Today's Date: 04/01/2018 OT Individual Time: 2411-4643 OT Individual Time Calculation (min): 60 min   Short Term Goals: Week 3:  OT Short Term Goal 1 (Week 3): LTG=STG 2/2 ELOS  Skilled Therapeutic Interventions/Progress Updates:    Pt greeted semi-reclined in bed finishing breakfast with set-up A. Pt needed assistance to advance L hip towards EOB, then verbal cues for L hand placement on RW and cues to push off of bed instead of pull up on RW for sit<>stand. Pt needed mod A to ambulate to the bathroom today with max cues to advance LLE prior to taking step. Mod A for turning to sit onto commode. Pt with voided bladder, then ambulated to tub bench with mIN/MOD A and RW. Pt able to don wash mit on L hand today without assistance. Bathing completed with pt using L hand for 50-60% of bathing tasks. Pt needed CGA with intermittent min A for sitting balance today on tub bench. Worked on functional use of L hand within grooming tasks with pt trying to use L hand for all activities with min verbal cues. Pt able to maintain grip on deodorant today while applying it! OT placed wedge under L hip today for neutral pelvic positioning. Dressing with min A. Pt left seated in wc at end of session with safety belt on and needs met.   Therapy Documentation Precautions:  Precautions Precautions: Fall Precaution Comments: expressive aphasia, L hemiplgia, impulsive Restrictions Weight Bearing Restrictions: No Pain:   none/denies pain  Therapy/Group: Individual Therapy  Valma Cava 04/01/2018, 8:12 AM

## 2018-04-01 NOTE — Progress Notes (Signed)
Physical Therapy Session Note LATE ENTRY  Patient Details  Name: Tiwan Schnitker MRN: [id number] Date of Birth: [birth year]  Today's Date: 04/01/2018 PT Individual Time: 1300-1400 PT Individual Time Calculation (min): 60 min   Short Term Goals: Week 4:  PT Short Term Goal 1 (Week 4): = LTG  Skilled Therapeutic Interventions/Progress Updates:    pt performs gait with orthotist present to assess need for AFO vs foot up brace. Pt able to perform gait with RW and decreased toe drag with foot up brace with min A.  Orthotist to provide foot up brace and toe cap for Lt LE support and control.  Pt continues to require manual facilitation for balance and wt shifts and attention to left in distracting environments.  Lt NMR with focus on Lt LE stance control with step ups and laterally with Rt LE, mini squats and sit <> stand without UE support.  Pt propels w/c to room with hemi technique and min cuing for attention.  Pt left in room with alarm set, needs at hand. Therapy Documentation Precautions:  Precautions Precautions: Fall Precaution Comments: expressive aphasia, L hemiplgia, impulsive Restrictions Weight Bearing Restrictions: No Pain:  no c/o pain   Therapy/Group: Individual Therapy  Ishmel Acevedo 04/01/2018, 2:01 PM

## 2018-04-01 NOTE — Progress Notes (Addendum)
Physical Therapy Weekly Progress Note  Patient Details  Name: Alegandro Macnaughton MRN: [id number] Date of Birth: 01-14-1941  Beginning of progress report period: March 25, 2018 End of progress report period: April 01, 2018  Patient is progressing towards long term goals. W/c goal added to give pt increased independence with community mobility. Pt continues to require min A for balance and transfers, cues for safety and awareness of Lt UE.  Patient continues to demonstrate the following deficits muscle weakness, abnormal tone, unbalanced muscle activation, decreased coordination and decreased motor planning, decreased awareness and decreased standing balance, hemiplegia and decreased balance strategies and therefore will continue to benefit from skilled PT intervention to increase functional independence with mobility.  Patient not progressing toward long term goals.  See goal revision. gait goals downgraded to min A  Continue plan of care.  PT Short Term Goals Week 3:  PT Short Term Goal 1 (Week 3): = LTG Week 4:  PT Short Term Goal 1 (Week 4): = LTG  Skilled Therapeutic Interventions/Progress Updates:  Ambulation/gait training;Balance/vestibular training;Cognitive remediation/compensation;Community reintegration;Discharge planning;DME/adaptive equipment instruction;Functional electrical stimulation;Neuromuscular re-education;Functional mobility training;Pain management;Patient/family education;Stair training;Splinting/orthotics;Therapeutic Activities;Therapeutic Exercise;UE/LE Strength taining/ROM;UE/LE Coordination activities;Wheelchair propulsion/positioning     Karman Biswell 04/01/2018, 8:15 AM

## 2018-04-01 NOTE — Telephone Encounter (Signed)
Oral Chemotherapy Pharmacist Encounter   Attempted to reach patient's wife, Juliann Pulse, to provide update and offer for initial counseling on oral medication: Temodar.  No answer. Left VM to call back to coordinate medication acquisition and discuss initial counseling.   Johny Drilling, PharmD, BCPS, BCOP  04/01/2018   3:44 PM [facility]cology [facility] [phone]

## 2018-04-01 NOTE — Telephone Encounter (Signed)
Oral Oncology Patient Advocate Encounter  I called the patients daughter this morning and she was going to call Juliann Pulse and get her to call me. I verified Ludger Nutting number with the daughter and we have that correct.  I called Juliann Pulse and had to leave a voicemail.  Will continue to follow up  [place] Patient [place] Phone [phone] Fax [phone]

## 2018-04-01 NOTE — Progress Notes (Addendum)
[facility] PHYSICAL MEDICINE & REHABILITATION PROGRESS NOTE  Subjective/Complaints: Up with therapy. No new complaints  ROS: limited due to language/communication   Objective: Vital Signs: Blood pressure 107/65, pulse 91, temperature 98.6 F (37 C), temperature source Oral, resp. rate 14, height 5\' 5"  (1.651 m), weight 78.2 kg, SpO2 96 %. No results found. No results for input(s): WBC, HGB, HCT, PLT in the last 72 hours. Recent Labs    03/30/18 0534  NA 132*  K 4.7  CL 99  CO2 26  GLUCOSE 116*  BUN 28*  CREATININE 0.97  CALCIUM 8.8*    Physical Exam: BP 107/65 (BP Location: Right Arm)   Pulse 91   Temp 98.6 F (37 C) (Oral)   Resp 14   Ht 5\' 5"  (1.651 m)   Wt 78.2 kg   SpO2 96%   BMI 28.70 kg/m  Constitutional: No distress . Vital signs reviewed. HEENT: EOMI, oral membranes moist Neck: supple Cardiovascular: RRR without murmur. No JVD    Respiratory: CTA Bilaterally without wheezes or rales. Normal effort    GI: BS +, non-tender, non-distended  Musc: No edema or tenderness in extremities. Neurological: He is alert More spontaneous word finding, especially with automatic speech Motor: Left upper extremity: Shoulder abduction, elbow flexion/extension 3-4/5, handgrip   3 to 4/5--improving but won't initiate use of it.  Left lower extremity: 3 to+/5 proximal distally --initiating more with left hand Skin: Skin is warm and dry.  Scalp wound CDI Psychiatric: in good spirits  Assessment/Plan: 1. 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  Physiatrist is providing close team supervision and 24 hour management of active medical problems listed below.  Physiatrist and rehab team continue to assess barriers to discharge/monitor patient progress toward functional and medical goals  Care Tool:  Bathing    Body parts bathed by patient:  Chest, Abdomen, Front perineal area, Right upper leg, Left upper leg, Face, Left arm, Right lower leg, Left lower leg, Right arm, Buttocks   Body parts bathed by helper: Buttocks     Bathing assist Assist Level: Contact Guard/Touching assist     Upper Body Dressing/Undressing Upper body dressing   What is the patient wearing?: Pull over shirt    Upper body assist Assist Level: Contact Guard/Touching assist    Lower Body Dressing/Undressing Lower body dressing      What is the patient wearing?: Underwear/pull up, Pants     Lower body assist Assist for lower body dressing: Contact Guard/Touching assist     Toileting Toileting    Toileting assist Assist for toileting: Minimal Assistance - Patient > 75%     Transfers Chair/bed transfer  Transfers assist     Chair/bed transfer assist level: Contact Guard/Touching assist     Locomotion Ambulation   Ambulation assist      Assist level: Minimal Assistance - Patient > 75% Assistive device: Walker-rolling Max distance: 80   Walk 10 feet activity   Assist     Assist level: Minimal Assistance - Patient > 75% Assistive device: Walker-rolling   Walk 50 feet activity   Assist    Assist level: Minimal Assistance - Patient > 75% Assistive device: Walker-rolling    Walk 150 feet activity   Assist    Assist level: Minimal Assistance - Patient > 75% Assistive device: Walker-rolling    Walk 10 feet on uneven surface  activity   Assist  Walk 10 feet on uneven surfaces activity did not occur: Safety/medical concerns         Wheelchair     Assist Will patient use wheelchair at discharge?: Yes Type of Wheelchair: Manual    Wheelchair assist level: Supervision/Verbal cueing Max wheelchair distance: 150 ft(BLE)    Wheelchair 50 feet with 2 turns activity    Assist        Assist Level: Supervision/Verbal cueing   Wheelchair 150 feet activity     Assist     Assist Level:  Supervision/Verbal cueing      Medical Problem List and Plan: 1.  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 Status post craniotomy 03/07/2018. Pathology positive for glioblastoma  -Continue CIR therapies including PT, OT, and SLP   Decadron taper completed  -pt to begin XRT on Monday 04/04/18. CIR therapies will take place earlier in the day to allow for transport/XRT at [facility]. 2.  DVT Prophylaxis/Anticoagulation: SCDs. 3. Pain Management: Tylenol as needed 4. Mood:  Provide emotional support 5. Neuropsych: This patient is not capable of making decisions on his own behalf. 6. Skin/Wound Care:  Routine skin checks    7. Fluids/Electrolytes/Nutrition:  Routine in and out's   -BUN increased sl, continue to push p.o. fluids 8. Seizures.   Keppra maintained at 1500 twice daily  Consider addition of Vimpat if persistent breakthrough seizures  Controlled at present 9. Hypertension.   Lisinopril increased to 15 mg on 12/24  bp's improved with decreasing steroids.  Vitals:   03/31/18 0533 03/31/18 1939  BP: 115/62 107/65  Pulse: 86 91  Resp: 20 14  Temp: 97.7 F (36.5 C) 98.6 F (37 C)  SpO2: 97% 96%  Well controlled 1/10 10.  Steroid-induced hyperglycemia on prediabetes. Glucophage 1000 mg twice a day.  Off steroids.      CBG (last 3)  Recent Labs    03/31/18 1707 03/31/18 2126 04/01/18 0629  GLUCAP 127* 120* 118*  blood sugars improving -continue same regimen 11. Hyperlipidemia. Lipitor 12.  Hyponatremia   Sodium 133 12/30---> sodium 130 on 1/6--> 132 1/8        13.  Hypoalbuminemia  Supplement initiated on 12/23 14.  Acute blood loss anemia  Hemoglobin 11.8 on 1/6    15. Borderline K+  4.7 on 1/8     LOS: 21 days A FACE TO [place] 04/01/2018, 10:02 AM

## 2018-04-01 NOTE — Progress Notes (Signed)
Occupational Therapy Session Note  Patient Details  Name: Bryan [name] MRN: [id number] Date of Birth: [birth year]  Today's Date: 04/01/2018 OT Individual Time: 1420-1455 OT Individual Time Calculation (min): 35 min   Skilled Therapeutic Interventions/Progress Updates: patient stated his blood pressure was low per nurse tech.   Patient initially stated he was asympomatic for low blood pressure, but later into the session, he stated he was fatigued and wanted to ly down.      Patient participated in seated endurance activities and then upon patient request after c/o fatigue he completed w/c to bed transfer with close S and bed mobility and extra time and cues.  Nurse tech verified patient's statement that blood pressure was low.   Nurse tech informed RN.  Patient was lying in bed without complaints (except mild fatigue) at the end of this session with call bell and phone within reach     Therapy Documentation Precautions:  Precautions Precautions: Fall Precaution Comments: expressive aphasia, L hemiplgia, impulsive Restrictions Weight Bearing Restrictions: No General: General OT Amount of Missed Time: 10 Minutes(10)  Pain:denied   Therapy/Group: Individual Therapy  Alfredia Ferguson [facility] 04/01/2018, 8:44 PM

## 2018-04-01 NOTE — Progress Notes (Signed)
Speech Language Pathology Weekly Progress and Session Note  Patient Details  Name: Bryan [name] MRN: [id number] Date of Birth: October 27, 1940  Beginning of progress report period: March 25, 2018 End of progress report period: April 01, 2018  Today's Date: 04/01/2018 SLP Individual Time: 1030-1100 SLP Individual Time Calculation (min): 30 min  Short Term Goals: Week 3: SLP Short Term Goal 1 (Week 3): Pt will self-monitor and self-correct verbal errors in 7 out of 10 opportunities with Mod A verbal cues.  SLP Short Term Goal 1 - Progress (Week 3): Met SLP Short Term Goal 2 (Week 3): Pt will read at the sentence level with ~ 75% accuracy and Mod A verbal cues to self-monitor and correct errors.  SLP Short Term Goal 2 - Progress (Week 3): Met SLP Short Term Goal 3 (Week 3): Patient will write at the phrase level with 75% accuracy and Mod A verbal and visual cues to self-monitor and correct errors.  SLP Short Term Goal 3 - Progress (Week 3): Not met SLP Short Term Goal 4 (Week 3): Patient will express basic wants/needs at the phrase level with approrpiate syntax with Mod A verbal cues and 75% accuracy.  SLP Short Term Goal 4 - Progress (Week 3): Met SLP Short Term Goal 5 (Week 3): Pt will complete basic familiar tasks with supervision A verbal cues.  SLP Short Term Goal 5 - Progress (Week 3): Met    New Short Term Goals: Week 4: SLP Short Term Goal 1 (Week 4): Patient will write at the phrase level with 75% accuracy and Mod A verbal and visual cues to self-monitor and correct errors.  SLP Short Term Goal 2 (Week 4): Patient will self-monitor and correct verbal errors at the sentence level with supervision verbal cues.  SLP Short Term Goal 3 (Week 4): Patient will utilize diaphragmatic breathing at the phrase level to maximize vocal intensity and overall speech intelligibility with Mod A verbal cues.  SLP Short Term Goal 4 (Week 4): Patient will perform 25 repetitions of RMT exercises with Min  A verbal cues for accuracy and a self-percieved effort level of 8/10.   Weekly Progress Updates: Patient continues to make excellent gains and has met 4 of 5 STGs this reporting period. Currently, patient requires overall supervision level verbal cues to complete functional and familiar tasks safely due to impulsivity. Patient demonstrates increased word-finding and can verbalize at the sentence and conversation level with overall Min A-Supervision to self-monitor and correct errors. Patient's speech intelligibility is impacted by decreased vocal intensity, suspect due to decreased use of diaphragmatic muscles and impaired coordination. RMT introduced in hopes of maximizing coordination of inhalation and exhalation and strengthening respiratory muscle function. Patient demonstrates increased ability read at the sentence level but requires Mod-Max A verbal and visual cues for written expression at the phrase level due to perseveration and to self-monitor and correct errors with syntax. Patient and family education ongoing. Patient would benefit from f/u SLP services to maximize his cognitive-linguistic function prior to discharge.      Intensity: Minumum of 1-2 x/day, 30 to 90 minutes Frequency: 3 to 5 out of 7 days Duration/Length of Stay: 1/15 Treatment/Interventions: Speech/Language facilitation;Patient/family education;Dysphagia/aspiration precaution training;Cognitive remediation/compensation;Environmental controls;Internal/external aids;Functional tasks;Cueing hierarchy;Therapeutic Activities   Daily Session  Skilled Therapeutic Interventions: Skilled treatment session focused on completion of RMT exercises. Patient performed 25 repetitions of IMST and EMST exercises with Min A verbal cues for accuracy with a self-percieved effort level of 8/10. Patient's ability to complete exercises appeared  much improved compared to yesterday. Patient's family present and educated on how to cue and perform  exercises appropriately to maximize utilization. All verbalized understanding. Patient left upright in wheelchair with alarm on and all needs within reach. Continue with current plan of care.   Pain Pain Assessment Pain Scale: 0-10 Pain Score: 0-No pain  Therapy/Group: Individual Therapy  Bryan [name] 04/01/2018, 12:28 PM

## 2018-04-02 DIAGNOSIS — C719 Malignant neoplasm of brain, unspecified: Secondary | ICD-10-CM

## 2018-04-02 LAB — GLUCOSE, CAPILLARY
Glucose-Capillary: 112 mg/dL — ABNORMAL HIGH (ref 70–99)
Glucose-Capillary: 110 mg/dL — ABNORMAL HIGH (ref 70–99)
Glucose-Capillary: 196 mg/dL — ABNORMAL HIGH (ref 70–99)
Glucose-Capillary: 105 mg/dL — ABNORMAL HIGH (ref 70–99)

## 2018-04-02 NOTE — Progress Notes (Signed)
Bryan [name] PHYSICAL MEDICINE & REHABILITATION PROGRESS NOTE  Subjective/Complaints: Patient seen laying in bed this morning.  He states he slept well overnight.  She remembers me from 2 weeks ago.  ROS: limited due to language/communication, but improving and denies CP, SOB, nausea, vomiting, diarrhea.  Objective: Vital Signs: Blood pressure 119/65, pulse 92, temperature 98.1 F (36.7 C), temperature source Oral, resp. rate 18, height 5\' 5"  (1.651 m), weight 78.2 kg, SpO2 93 %. No results found. No results for input(s): WBC, HGB, HCT, PLT in the last 72 hours. No results for input(s): NA, K, CL, CO2, GLUCOSE, BUN, CREATININE, CALCIUM in the last 72 hours.  Physical Exam: BP 119/65 (BP Location: Right Arm)   Pulse 92   Temp 98.1 F (36.7 C) (Oral)   Resp 18   Ht 5\' 5"  (1.651 m)   Wt 78.2 kg   SpO2 93%   BMI 28.70 kg/m  Constitutional: No distress . Vital signs reviewed. HENT: Normocephalic.  Atraumatic. Eyes: EOMI. No discharge. Cardiovascular: RRR. No JVD. Respiratory: CTA Bilaterally. Normal effort. GI: BS +. Non-distended. Musc: No edema or tenderness in extremities. Neurological: He is alert More spontaneous word finding deficits, improving Motor: Left upper extremity: Shoulder abduction, elbow flexion/extension 4-/5, handgrip, 4-/5 Left lower extremity: 3+/5 proximal distally Skin: Skin is warm and dry. Scalp wound CDI Psychiatric: Normal mood.  Normal behavior.  Assessment/Plan: 1. 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  Physiatrist is providing close team supervision and 24 hour management of active medical problems listed below.  Physiatrist and rehab team continue to assess barriers to discharge/monitor patient progress toward functional and medical goals  Care Tool:  Bathing    Body parts bathed by patient: Chest,  Abdomen, Front perineal area, Right upper leg, Left upper leg, Face, Left arm, Right lower leg, Left lower leg, Right arm, Buttocks   Body parts bathed by helper: Buttocks     Bathing assist Assist Level: Minimal Assistance - Patient > 75%     Upper Body Dressing/Undressing Upper body dressing   What is the patient wearing?: Pull over shirt    Upper body assist Assist Level: Supervision/Verbal cueing    Lower Body Dressing/Undressing Lower body dressing      What is the patient wearing?: Underwear/pull up, Pants     Lower body assist Assist for lower body dressing: Minimal Assistance - Patient > 75%     Toileting Toileting    Toileting assist Assist for toileting: Minimal Assistance - Patient > 75%     Transfers Chair/bed transfer  Transfers assist     Chair/bed transfer assist level: Contact Guard/Touching assist     Locomotion Ambulation   Ambulation assist      Assist level: Minimal Assistance - Patient > 75% Assistive device: Walker-rolling Max distance: 80   Walk 10 feet activity   Assist     Assist level: Minimal Assistance - Patient > 75% Assistive device: Walker-rolling   Walk 50 feet activity   Assist    Assist level: Minimal Assistance - Patient > 75% Assistive device: Walker-rolling    Walk 150 feet activity   Assist    Assist level: Minimal Assistance - Patient > 75% Assistive device: Walker-rolling    Walk 10 feet on uneven surface  activity   Assist Walk 10 feet on uneven surfaces activity did not occur: Safety/medical concerns  Wheelchair     Assist Will patient use wheelchair at discharge?: Yes Type of Wheelchair: Manual    Wheelchair assist level: Supervision/Verbal cueing Max wheelchair distance: 150 ft(BLE)    Wheelchair 50 feet with 2 turns activity    Assist        Assist Level: Supervision/Verbal cueing   Wheelchair 150 feet activity     Assist     Assist Level:  Supervision/Verbal cueing      Medical Problem List and Plan: 1.  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 Status post craniotomy 03/07/2018. Pathology positive for glioblastoma  Continue CIR    Decadron taper completed  Pt to begin XRT on Monday 04/04/18. CIR therapies will take place earlier in the day to allow for transport/XRT at [facility]. 2.  DVT Prophylaxis/Anticoagulation: SCDs. 3. Pain Management: Tylenol as needed 4. Mood:  Provide emotional support 5. Neuropsych: This patient is not capable of making decisions on his own behalf. 6. Skin/Wound Care:  Routine skin checks 7. Fluids/Electrolytes/Nutrition:  Routine in and out's  8. Seizures.   Keppra maintained at 1500 twice daily  Consider addition of Vimpat if persistent breakthrough seizures  Controlled on 1/11 9. Hypertension.   Lisinopril increased to 15 mg on 12/24  bp's improved with decreasing steroids.  Vitals:   04/01/18 1951 04/02/18 0436  BP: 102/60 119/65  Pulse: 84 92  Resp: 15 18  Temp: 98.1 F (36.7 C) 98.1 F (36.7 C)  SpO2: 96% 93%   Controlled on 1/11, will consider decreasing medications if persistently low 10.  Steroid-induced hyperglycemia on prediabetes. Glucophage 1000 mg twice a day.  Off steroids now.      CBG (last 3)  Recent Labs    04/01/18 1631 04/01/18 2108 04/02/18 0621  GLUCAP 126* 131* 112*   CBGs relatively controlled on 1/12 11. Hyperlipidemia. Lipitor 12.  Hyponatremia   Sodium 132 on 1/8    13.  Hypoalbuminemia  Supplement initiated on 12/23 14.  Acute blood loss anemia  Hemoglobin 11.8 on 1/6 15. Borderline K+  4.7 on 1/8     LOS: 22 days A FACE TO FACE EVALUATION WAS PERFORMED  Bryan [name] [name] 04/02/2018, 8:17 AM

## 2018-04-02 NOTE — Progress Notes (Signed)
  Radiation Oncology         [phone] ________________________________  Name: Bryan [name] MRN: [id number]  Date: 03/29/2018  DOB: 1941/01/14  SIMULATION AND TREATMENT PLANNING NOTE    ICD-10-CM   1.  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  Identity was confirmed.  All relevant records and images related to the planned course of therapy were reviewed.  The patient freely provided informed written consent to proceed with treatment after reviewing the details related to the planned course of therapy. The consent form was witnessed and verified by the simulation staff.  Then, the patient was set-up in a stable reproducible  supine position for radiation therapy.  CT images were obtained.  Surface markings were placed.  The CT images were loaded into the planning software.  Then the planning CT was fused with the MRI by our physics staff.  The target and avoidance structures were contoured.  Treatment planning then occurred.  The radiation prescription was entered and confirmed.  I designed and supervised the construction of a total of one medically necessary complex treatment device consisting of thermoplastic mask used for immobilization.  IMAGE GUIDANCE:  The patient will undergo megavoltage CT imaging prior to each fraction with precision couch adjustments for image guided radiotherapy. The initial tumor volume plus edema plus a 1 cm margin will be treated using helical intensity modulated radiotherapy with 6 megavolt x-rays.  For the boost, the initial tumor volume plus a 1 cm margin was treated using helical intensity modulated radiotherapy with 6 megavolt x-rays  PLAN TYPE:  I have requested : Intensity Modulated Radiotherapy (IMRT) is medically necessary for this case for the following reason:  Critical CNS structure  avoidance - brainstem, optic chiasm, optic nerve.[name]  SPECIAL TREATMENT PROCEDURE:  The planned course of therapy using radiation constitutes a special treatment procedure. Special care is required in the management of this patient for the following reasons. This treatment constitutes a Special Treatment Procedure for the following reason: [ Concurrent chemotherapy requiring careful monitoring for increased toxicities of treatment including weekly laboratory values..  The special nature of the planned course of radiotherapy will require increased physician supervision and oversight to ensure patient's safety with optimal treatment outcomes.  PLAN:  The glioblastoma site will be treated to a total dose of 60 Gy in 30 fractions using the following plan:  1.  The initial tumor volume plus edema plus a 1 cm margin was treated to 44 Gy in 22 fractions of 2 Gy  2.  The initial tumor volume plus a 1 cm margin was boosted to 60 Gy with 8 additional fractions of 2 Gy  ________________________________  Sheral Apley Tammi Klippel, M.D.

## 2018-04-02 NOTE — Progress Notes (Signed)
No seizure activity noted; Seizure precaution done. Oxygen  and suction set up in room.

## 2018-04-03 ENCOUNTER — Inpatient Hospital Stay (HOSPITAL_COMMUNITY): Payer: Medicare Other | Admitting: Occupational Therapy

## 2018-04-03 DIAGNOSIS — R4701 Aphasia: Secondary | ICD-10-CM

## 2018-04-03 LAB — GLUCOSE, CAPILLARY
Glucose-Capillary: 111 mg/dL — ABNORMAL HIGH (ref 70–99)
Glucose-Capillary: 98 mg/dL (ref 70–99)
Glucose-Capillary: 122 mg/dL — ABNORMAL HIGH (ref 70–99)
Glucose-Capillary: 131 mg/dL — ABNORMAL HIGH (ref 70–99)

## 2018-04-03 NOTE — Progress Notes (Addendum)
[place] PHYSICAL MEDICINE & REHABILITATION PROGRESS NOTE  Subjective/Complaints: Patient seen laying in bed this morning.  He states he slept well overnight.  Expressive aphasia improving with increased time.  ROS: limited due to language/communication, but improving and denies CP, SOB, nausea, vomiting, diarrhea.  Objective: Vital Signs: Blood pressure (!) 107/91, pulse 92, temperature 98 F (36.7 C), temperature source Oral, resp. rate 17, height 5\' 5"  (1.651 m), weight 78.2 kg, SpO2 94 %. No results found. No results for input(s): WBC, HGB, HCT, PLT in the last 72 hours. No results for input(s): NA, K, CL, CO2, GLUCOSE, BUN, CREATININE, CALCIUM in the last 72 hours.  Physical Exam: BP (!) 107/91   Pulse 92   Temp 98 F (36.7 C) (Oral)   Resp 17   Ht 5\' 5"  (1.651 m)   Wt 78.2 kg   SpO2 94%   BMI 28.70 kg/m  Constitutional: No distress . Vital signs reviewed. HENT: Normocephalic.  Atraumatic. Eyes: EOMI. No discharge. Cardiovascular: RRR.  No JVD. Respiratory: CTA bilaterally.  Normal effort. GI: BS +. Non-distended. Musc: No edema or tenderness in extremities. Neurological: He is alert Expressive aphasia improving with increased time Motor: Left upper extremity: Shoulder abduction, elbow flexion/extension 4/5, handgrip, 4/5 Left lower extremity: 3+/5 proximal distally, improving Skin: Skin is warm and dry. Scalp wound CDI Psychiatric: Normal mood.  Normal behavior.  Assessment/Plan: 1. 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  Physiatrist is providing close team supervision and 24 hour management of active medical problems listed below.  Physiatrist and rehab team continue to assess barriers to discharge/monitor patient progress toward functional and medical goals  Care Tool:  Bathing    Body parts bathed by patient: Chest,  Abdomen, Front perineal area, Right upper leg, Left upper leg, Face, Left arm, Right lower leg, Left lower leg, Right arm, Buttocks   Body parts bathed by helper: Buttocks     Bathing assist Assist Level: Minimal Assistance - Patient > 75%     Upper Body Dressing/Undressing Upper body dressing   What is the patient wearing?: Pull over shirt    Upper body assist Assist Level: Supervision/Verbal cueing    Lower Body Dressing/Undressing Lower body dressing      What is the patient wearing?: Underwear/pull up, Pants     Lower body assist Assist for lower body dressing: Minimal Assistance - Patient > 75%     Toileting Toileting    Toileting assist Assist for toileting: Moderate Assistance - Patient 50 - 74%     Transfers Chair/bed transfer  Transfers assist     Chair/bed transfer assist level: Contact Guard/Touching assist     Locomotion Ambulation   Ambulation assist      Assist level: Minimal Assistance - Patient > 75% Assistive device: Walker-rolling Max distance: 80   Walk 10 feet activity   Assist     Assist level: Minimal Assistance - Patient > 75% Assistive device: Walker-rolling   Walk 50 feet activity   Assist    Assist level: Minimal Assistance - Patient > 75% Assistive device: Walker-rolling    Walk 150 feet activity   Assist    Assist level: Minimal Assistance - Patient > 75% Assistive device: Walker-rolling    Walk 10 feet on uneven surface  activity   Assist Walk 10 feet on uneven surfaces activity did not occur: Safety/medical concerns  Wheelchair     Assist Will patient use wheelchair at discharge?: Yes Type of Wheelchair: Manual    Wheelchair assist level: Supervision/Verbal cueing Max wheelchair distance: 150 ft(BLE)    Wheelchair 50 feet with 2 turns activity    Assist        Assist Level: Supervision/Verbal cueing   Wheelchair 150 feet activity     Assist     Assist Level:  Supervision/Verbal cueing      Medical Problem List and Plan: 1.  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 Status post craniotomy 03/07/2018. Pathology positive for glioblastoma  Continue CIR    Decadron taper completed  Pt to begin XRT on Monday 04/04/18, tomorrow. CIR therapies will take place earlier in the day to allow for transport/XRT at [facility]. 2.  DVT Prophylaxis/Anticoagulation: SCDs. 3. Pain Management: Tylenol as needed 4. Mood:  Provide emotional support 5. Neuropsych: This patient is not capable of making decisions on his own behalf. 6. Skin/Wound Care:  Routine skin checks 7. Fluids/Electrolytes/Nutrition:  Routine in and out's  8. Seizures.   Keppra maintained at 1500 twice daily  Consider addition of Vimpat if persistent breakthrough seizures  Controlled on 1/12 9. Hypertension.   Lisinopril increased to 15 mg on 12/24  bp's improved with decreasing steroids.  Vitals:   04/03/18 0440 04/03/18 0455  BP: (!) 133/120 (!) 107/91  Pulse: 82 92  Resp: 17   Temp: 98 F (36.7 C)   SpO2: 90% 94%   Relatively controlled on 1/12, will consider decreasing medications if persistently low 10.  Steroid-induced hyperglycemia on prediabetes. Glucophage 1000 mg twice a day.  Off steroids now.      CBG (last 3)  Recent Labs    04/02/18 1621 04/02/18 2103 04/03/18 0603  GLUCAP 196* 105* 111*   Slightly labile on 1/12, otherwise relatively controlled 11. Hyperlipidemia. Lipitor 12.  Hyponatremia   Sodium 132 on 1/8     Labs ordered for tomorrow 13.  Hypoalbuminemia  Supplement initiated on 12/23 14.  Acute blood loss anemia  Hemoglobin 11.8 on 1/6 15. Borderline K+  4.7 on 1/8     LOS: 23 days A FACE TO FACE EVALUATION WAS PERFORMED   [name] 04/03/2018, 7:34 AM

## 2018-04-03 NOTE — Progress Notes (Signed)
Occupational Therapy Session Note  Patient Details  Name: Bryan [name] MRN: [id number] Date of Birth: 05-11-1940  Today's Date: 04/03/2018 OT Individual Time: 0900-1011 OT Individual Time Calculation (min): 71 min   Short Term Goals: Week 3:  OT Short Term Goal 1 (Week 3): LTG=STG 2/2 ELOS  Skilled Therapeutic Interventions/Progress Updates:    Pt greeted in w/c. When asked what he wanted to do, pt verbalized "shower." Tx focus on Lt NMR, functional transfers, midline orientation, and standing balance during bathing (at shower level), dressing (sit<stand w/c level at sink) and w/c propulsion in room. All functional transfers completed using RW at ambulatory level with Mod A and vcs for Lt attention. Pt required manual facilitation and mod vcs for neutral upright alignment while sitting on TTB due to Lt lean. He was able to correct posture for short windows of time with cuing alone. Pt donned/doffed his bath mitt and washed Rt side using Lt with min vcs. Mod A sit<stand for perihygiene completion. Pt unable to maintain Lt grasp on bar without OT assistance while standing. Supervision for donning overhead shirt with increased times and vcs! Initially he donned shirt backwards, but able to recognize, and self correct with setup again. Pt also oriented pants himself given extra time for problem solving. All clothing items were draped on RW on Lt side, and OT had pt verbalize what he needed before reaching for them with Lt. Mod A sit<stand with Min A to elevate pants on Lt side. Worked on midline orientation using mirror when standing to minimize Lt lean. He required Mod A with footwear due to fatigue, but able to achieve figure 4 bilaterally and actively assist therapist with each step. He declined grooming tasks, or combing hair. Per pt "It's rugged- I want to look like a macho man!" Modified w/c rim on Lt side to increase ease of grasp when propelling w/c (pt requested to work on this). Max vcs for visual  attendance to Lt during short distance propulsion in room. Able to maintain weak grasp and complete small pushes on Lt but hand falls off rim once he stops looking at it. At end of session pt was left in w/c with all needs, half lap tray, and safety belt fastened. Looking forward to dance group.   Therapy Documentation Precautions:  Precautions Precautions: Fall Precaution Comments: expressive aphasia, L hemiplgia, impulsive Restrictions Weight Bearing Restrictions: No Pain: No c/o pain during tx    ADL: ADL Eating: Not assessed Grooming: Moderate assistance Where Assessed-Grooming: Wheelchair, Sitting at sink Upper Body Bathing: Moderate assistance Where Assessed-Upper Body Bathing: Wheelchair, Sitting at sink Lower Body Bathing: Maximal assistance Where Assessed-Lower Body Bathing: Wheelchair, Sitting at sink, Standing at sink Upper Body Dressing: Moderate assistance Where Assessed-Upper Body Dressing: Sitting at sink, Wheelchair Lower Body Dressing: Maximal assistance Where Assessed-Lower Body Dressing: Standing at sink, Sitting at sink, Wheelchair Toileting: Moderate assistance Where Assessed-Toileting: Glass blower/designer: Moderate assistance Toilet Transfer Method: Squat pivot Toilet Transfer Equipment: Energy manager: Not assessed :     Therapy/Group: Individual Therapy  Dejan Angert A Harith Mccadden 04/03/2018, 12:39 PM

## 2018-04-04 ENCOUNTER — Inpatient Hospital Stay (HOSPITAL_COMMUNITY): Payer: Medicare Other | Admitting: Occupational Therapy

## 2018-04-04 ENCOUNTER — Inpatient Hospital Stay (HOSPITAL_COMMUNITY): Payer: Medicare Other | Admitting: Speech Pathology

## 2018-04-04 ENCOUNTER — Ambulatory Visit
Admission: RE | Admit: 2018-04-04 | Discharge: 2018-04-04 | Disposition: A | Discharge status: Home / Self Care | Payer: Medicare Other | Source: Ambulatory Visit | Attending: Radiation Oncology | Admitting: Radiation Oncology

## 2018-04-04 ENCOUNTER — Inpatient Hospital Stay (HOSPITAL_COMMUNITY): Payer: Medicare Other | Admitting: Physical Therapy

## 2018-04-04 ENCOUNTER — Telehealth: Payer: Self-pay

## 2018-04-04 DIAGNOSIS — C711 Malignant neoplasm of frontal lobe: Secondary | ICD-10-CM | POA: Diagnosis not present

## 2018-04-04 DIAGNOSIS — Z510 Encounter for antineoplastic radiation therapy: Secondary | ICD-10-CM | POA: Diagnosis not present

## 2018-04-04 LAB — BASIC METABOLIC PANEL
Anion gap: 8 (ref 5–15)
BUN: 16 mg/dL (ref 8–23)
CO2: 25 mmol/L (ref 22–32)
Calcium: 8.3 mg/dL — ABNORMAL LOW (ref 8.9–10.3)
Chloride: 99 mmol/L (ref 98–111)
Creatinine, Ser: 0.87 mg/dL (ref 0.61–1.24)
GFR calc Af Amer: >60 mL/min (ref >60)
Glucose, Bld: 156 mg/dL — ABNORMAL HIGH (ref 70–99)
POTASSIUM: 3.8 mmol/L (ref 3.5–5.1)
SODIUM: 132 mmol/L — AB (ref 135–145)

## 2018-04-04 LAB — GLUCOSE, CAPILLARY
GLUCOSE-CAPILLARY: 96 mg/dL (ref 70–99)
GLUCOSE-CAPILLARY: 106 mg/dL — AB (ref 70–99)
Glucose-Capillary: 155 mg/dL — ABNORMAL HIGH (ref 70–99)
Glucose-Capillary: 111 mg/dL — ABNORMAL HIGH (ref 70–99)

## 2018-04-04 LAB — CBC
HCT: 32 % — ABNORMAL LOW (ref 39.0–52.0)
Hemoglobin: 11 g/dL — ABNORMAL LOW (ref 13.0–17.0)
MCH: 32 pg (ref 26.0–34.0)
MCHC: 34.4 g/dL (ref 30.0–36.0)
MCV: 93 fL (ref 80.0–100.0)
Platelets: 154 10*3/uL (ref 150–400)
RBC: 3.44 MIL/uL — ABNORMAL LOW (ref 4.22–5.81)
RDW: 13.4 % (ref 11.5–15.5)
WBC: 3.3 10*3/uL — ABNORMAL LOW (ref 4.0–10.5)
nRBC: 0 % (ref 0.0–0.2)

## 2018-04-04 NOTE — Discharge Summary (Signed)
Bryan [name], WOJTKIEWICZ MEDICAL RECORD [id number] ACCOUNT [id number] DATE OF BIRTH:01-14-1941 FACILITY: [facility] LOCATION: [facility] PHYSICIAN:ZACHARY SWARTZ, MD  DISCHARGE SUMMARY  DATE OF DISCHARGE:  04/06/2018  ADMIT DATE:  03/11/2018  DATE OF DISCHARGE:  04/06/2018   DISCHARGE DIAGNOSES: 1.  Left-sided weakness and dysarthria secondary to right insular mass, status post craniotomy 03/07/2018.  Pathology positive for glioblastoma. 2.  Sequential compression devices for deep venous thrombosis prophylaxis. 3.  Seizure. 4.  Hypertension. 5.  Diabetes mellitus. 6.  Hyperlipidemia. 7.  Hyponatremia.  HOSPITAL COURSE:  This is a 78 year old right-handed male with history of prediabetes, hypothyroidism, hypertension, presented to outside hospital in November with left facial weakness, dragging of left foot, slurred speech, found to have a right insular  tumor.  MRI reviewed showing right frontal tumor.  He was transferred to [facility] for evaluation and intervention.  He was followed by Dr. Mickeal Skinner and Dr. Tammi Klippel.  CT and imaging by report showed a right insular lesion.  Initially placed on  Decadron therapy, underwent an arteriogram that showed hypervascular right mass applied by be lenticulostriate arteries.  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 Tommi Rumps.   Maintained on Keppra for seizure prophylaxis.  Postoperative dysarthria, facial weakness.  Tolerating a regular diet.  The patient was admitted for comprehensive rehabilitation program.  PAST MEDICAL HISTORY:  See discharge diagnoses.  SOCIAL HISTORY:  He lives with his wife.  FUNCTIONAL STATUS:  Upon admission to rehab services was moderate assist for functional mobility, moderate assist ADLs.  PHYSICAL EXAMINATION: VITAL SIGNS:  Blood pressure 120/70, pulse 80, temperature 98, respirations 18. GENERAL:  Alert male in no acute distress.  Craniotomy  site clean and dry.  Speech was dysarthric, but intelligible. HEENT:  EOMs intact. NECK:  Supple, nontender, no JVD. CARDIOVASCULAR:  Rate controlled. ABDOMEN:  Soft, nontender, good bowel sounds. LUNGS:  Clear to auscultation without wheeze.   NEUROLOGIC:  Noted expressive greater than receptive aphasia.  REHABILITATION HOSPITAL COURSE:  The patient was admitted to inpatient rehabilitation services.  Therapies initiated on a 3-hour daily basis, consisting of physical therapy, occupational therapy, speech therapy and rehabilitation nursing.  The following  issues were addressed during patient's rehabilitation stay.  Pertaining to the patient's right insular mass, he had undergone craniotomy 03/07/2018.  Pathology positive for glioblastoma.  Decadron taper completed.  Radiation/chemotherapy to start 04/04/2018.  He  would follow with Dr. Mickeal Skinner as well as Dr. Tammi Klippel.  SCDs for DVT prophylaxis.  He remained on Keppra for seizure prophylaxis.  Blood pressure is controlled with lisinopril.  Monitoring of blood sugars closely after Decadron taper completed.  He  continued on Lipitor for hyperlipidemia.  The patient received weekly collaborative interdisciplinary team conferences to discuss estimated length of stay, family teaching, any barriers to discharge.  The patient was able to perform ambulation rolling walker, decreased toe drag with foot-up  brace minimal assistance.  Continues to require manual facilitation for balance.  Propels his wheelchair to the room with hemi-technique.  Activities of daily living and homemaking, participated in seated and endurance.  Energy conservation techniques.   All functional transfers completed using rolling walker ambulatory level moderate assist and verbal cues for left attention.  Worked on midline orientation.  He required moderate assist for foot wound for fatigue.  Tolerating a regular diet.  He was able  to communicate his needs.  The patient will write  out the phrase level was 75% accuracy.  He  had excellent overall progress.  Required supervision level.  Verbal cues to complete functional and familiar tasks safely due to some impulsivity.  He was discharged to home.  DISCHARGE MEDICATIONS:  Included Lipitor 40 mg p.o. daily, Flonase 1 spray each nostril daily,Temodar140 mg daily, Keppra 1500 mg p.o. b.i.d., Synthroid 75 mcg p.o. daily, lisinopril 50 mg p.o. daily, Glucophage 1000 mg p.o. b.i.d., multivitamin daily, Protonix 40 mg p.o.  daily, Tylenol as needed.  DIET:  Diabetic diet.  FOLLOWUP:  The patient would follow up with Dr. Alger Simons at the outpatient rehab service office as directed; Dr. Derrill Memo, call for appointment; Dr. Cecil Cobbs; Dr. Dr. Tammi Klippel; Dr. Reynold Bowen, medical management.  SPECIAL INSTRUCTIONS:  Radiation initiated 04/04/2018 with 30 treatments planned.  TN/NUANCE D:04/04/2018 T:04/04/2018 JOB:[id number]

## 2018-04-04 NOTE — Telephone Encounter (Signed)
Oral Oncology Patient Advocate Encounter  Medicare Part B and the supplement was billed to the [employer] and it still left him with a copay of $198.00.  I was successful at securing a grant with Patient [name]) for $3,300. This will keep the out of pocket expense for [employer] at $0. The grant information is as follows and has been shared with [facility].  Approval dates: 01/01/18-04/01/19 ID: [id number] Group: [id number] BIN: [id number] PCN: [id number]  I gave the patients wife, Juliann Pulse this information and she verbalized understanding and great appreciation.   Bryan [name] Patient [place] Phone [phone] Fax [phone]

## 2018-04-04 NOTE — Progress Notes (Signed)
[place] PHYSICAL MEDICINE & REHABILITATION PROGRESS NOTE  Subjective/Complaints: Up in bed ready for breakfast. No new complaints. In good spirits. Indicated to me that he finally had all of his christmas decorations down!  ROS: Patient denies fever, rash, sore throat, blurred vision, nausea, vomiting, diarrhea, cough, shortness of breath or chest pain, joint or back pain, headache, or mood change.   Objective: Vital Signs: Blood pressure 130/71, pulse (!) 105, temperature 98.3 F (36.8 C), temperature source Oral, resp. rate 18, height 5\' 5"  (1.651 m), weight 78.2 kg, SpO2 95 %. No results found. No results for input(s): WBC, HGB, HCT, PLT in the last 72 hours. No results for input(s): NA, K, CL, CO2, GLUCOSE, BUN, CREATININE, CALCIUM in the last 72 hours.  Physical Exam: BP 130/71 (BP Location: Left Arm)   Pulse (!) 105   Temp 98.3 F (36.8 C) (Oral)   Resp 18   Ht 5\' 5"  (1.651 m)   Wt 78.2 kg   SpO2 95%   BMI 28.70 kg/m  Constitutional: No distress . Vital signs reviewed. HEENT: EOMI, oral membranes moist Neck: supple Cardiovascular: RRR without murmur. No JVD    Respiratory: CTA Bilaterally without wheezes or rales. Normal effort    GI: BS +, non-tender, non-distended  Musc: No edema or tenderness in extremities. Neurological: He is alert Continues to initiate more language.  Motor: Left upper extremity: Shoulder abduction, elbow flexion/extension 3-4/5, handgrip   3 to 4/5--will initiate use of LUE with verbal/tactile cueing  Left lower extremity: 3 to+/5 proximal distally   Skin: Skin is warm and dry.  Scalp wound healed Psychiatric: pleasant  Assessment/Plan: 1. 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  Physiatrist is providing close team supervision and 24 hour management of active medical problems listed below.  Physiatrist  and rehab team continue to assess barriers to discharge/monitor patient progress toward functional and medical goals  Care Tool:  Bathing    Body parts bathed by patient: Chest, Abdomen, Front perineal area, Right upper leg, Left upper leg, Face, Left arm, Right lower leg, Left lower leg, Right arm, Buttocks   Body parts bathed by helper: Buttocks     Bathing assist Assist Level: Minimal Assistance - Patient > 75%     Upper Body Dressing/Undressing Upper body dressing   What is the patient wearing?: Pull over shirt    Upper body assist Assist Level: Supervision/Verbal cueing    Lower Body Dressing/Undressing Lower body dressing      What is the patient wearing?: Underwear/pull up, Pants     Lower body assist Assist for lower body dressing: Minimal Assistance - Patient > 75%     Toileting Toileting    Toileting assist Assist for toileting: Moderate Assistance - Patient 50 - 74%     Transfers Chair/bed transfer  Transfers assist     Chair/bed transfer assist level: Contact Guard/Touching assist     Locomotion Ambulation   Ambulation assist      Assist level: Minimal Assistance - Patient > 75% Assistive device: Walker-rolling Max distance: 80   Walk 10 feet activity   Assist     Assist level: Minimal Assistance - Patient > 75% Assistive device: Walker-rolling   Walk 50 feet activity   Assist    Assist level: Minimal Assistance - Patient > 75% Assistive device: Walker-rolling    Walk 150 feet activity   Assist  Assist level: Minimal Assistance - Patient > 75% Assistive device: Walker-rolling    Walk 10 feet on uneven surface  activity   Assist Walk 10 feet on uneven surfaces activity did not occur: Safety/medical concerns         Wheelchair     Assist Will patient use wheelchair at discharge?: Yes Type of Wheelchair: Manual    Wheelchair assist level: Supervision/Verbal cueing Max wheelchair distance: 150 ft(BLE)     Wheelchair 50 feet with 2 turns activity    Assist        Assist Level: Supervision/Verbal cueing   Wheelchair 150 feet activity     Assist     Assist Level: Supervision/Verbal cueing      Medical Problem List and Plan: 1.  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 Status post craniotomy 03/07/2018. Pathology positive for glioblastoma  -team conference today  Decadron taper completed  -  XRT starts today Monday 04/04/18. CIR therapies will take place earlier in the day to allow for transport/XRT at [facility]. 30 treatments planned 2.  DVT Prophylaxis/Anticoagulation: SCDs. 3. Pain Management: Tylenol as needed 4. Mood:  Provide emotional support 5. Neuropsych: This patient is not capable of making decisions on his own behalf. 6. Skin/Wound Care:  Routine skin checks    7. Fluids/Electrolytes/Nutrition:  Routine in and out's   -pushing fluids, follow BMET tomorrow 8. Seizures.   Keppra maintained at 1500 twice daily  Consider addition of Vimpat if persistent breakthrough seizures  Controlled at present 9. Hypertension.   Lisinopril increased to 15 mg on 12/24  bp's improved with decreasing steroids.  Vitals:   04/03/18 2031 04/04/18 0401  BP: 117/61 130/71  Pulse: 93 (!) 105  Resp: 18 18  Temp: 97.9 F (36.6 C) 98.3 F (36.8 C)  SpO2: 100% 95%  Well controlled 1/113 10.  Diabetes II:  Glucophage 1000 mg twice a day.       CBG (last 3)  Recent Labs    04/03/18 1640 04/03/18 2123 04/04/18 0618  GLUCAP 122* 131* 96  blood sugars have normalized off steroids 11. Hyperlipidemia. Lipitor 12.  Hyponatremia   Sodium 133 12/30---> sodium 130 on 1/6--> 132 1/8---> follow up tomorrow        13.  Hypoalbuminemia  Supplement initiated on 12/23 14.  Acute blood loss anemia  Hemoglobin 11.8 on 1/6    15. Borderline K+  4.7 on 1/8--follow up tomorrow     LOS: 24 days A FACE TO [place] 04/04/2018, 7:45 AM

## 2018-04-04 NOTE — Progress Notes (Signed)
Speech Language Pathology Daily Session Note  Patient Details  Name: Bryan [name] MRN: [id number] Date of Birth: 06-18-1940  Today's Date: 04/04/2018 SLP Individual Time: 0930-1015 SLP Individual Time Calculation (min): 45 min  Short Term Goals: Week 4: SLP Short Term Goal 1 (Week 4): Patient will write at the phrase level with 75% accuracy and Mod A verbal and visual cues to self-monitor and correct errors.  SLP Short Term Goal 2 (Week 4): Patient will self-monitor and correct verbal errors at the sentence level with supervision verbal cues.  SLP Short Term Goal 3 (Week 4): Patient will utilize diaphragmatic breathing at the phrase level to maximize vocal intensity and overall speech intelligibility with Mod A verbal cues.  SLP Short Term Goal 4 (Week 4): Patient will perform 25 repetitions of RMT exercises with Min A verbal cues for accuracy and a self-percieved effort level of 8/10.   Skilled Therapeutic Interventions: Skilled treatment session focused on speech goals. SLP facilitated session by providing Mod A verbal and visual cues for performing IMST exercises accurately.  SLP introduced new expiratory device (EMST 150). Patient performed 25 repetitions at 30 cm H20 with Min A verbal cues.  SLP also facilitated session by providing Mod A verbal cues for use of diaphragmatic breathing at the word level to maximize vocal intensity and overall speech intelligibility.  SLP left upright in wheelchair with alarm on and all needs within reach. Continue with current plan of care.      Pain Pain Assessment Pain Scale: 0-10 Pain Score: 0-No pain Faces Pain Scale: No hurt  Therapy/Group: Individual Therapy  Bryan [name] 04/04/2018, 11:17 AM

## 2018-04-04 NOTE — Patient Care Conference (Signed)
Inpatient RehabilitationTeam Conference and Plan of Care Update Date: 04/04/2018   Time: 1:30 PM    Patient Name: Bryan [name]      Medical Record Number: [id number]  Date of Birth: 12-13-1940 Sex: Male         Room/Bed: 4W13C/4W13C-01 Payor Info: Payor: MEDICARE / Plan: MEDICARE PART A AND B / Product Type: *No Product type* /    Admitting Diagnosis: Brain Tumor resection  Admit Date/Time:  03/11/2018 11:41 AM Admission Comments: No comment available   Primary Diagnosis:  <principal problem not specified> Principal Problem: <principal problem not specified>  Patient Active Problem List   Diagnosis Date Noted  . Expressive aphasia   . 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 Brain neoplasm ([place]) 03/28/2018  .  Glioblastoma, IDH-wildtype ([place])  03/28/2018  . Acute blood loss anemia   . Seizures ([place])   . Hypoalbuminemia due to protein-calorie malnutrition ([place])   . Hyponatremia   . Steroid-induced hyperglycemia   . Brain tumor ([place]) 03/11/2018  . Postoperative pain   . Seizure prophylaxis   . Prediabetes   . Dyslipidemia   . S/P craniotomy 03/08/2018  . Brain mass 02/16/2018  . Hypertension   . Type 2 diabetes mellitus without complication, without long-term current use of insulin ([place]) 01/16/2015  . Hyperlipidemia 04/11/2014  . Inguinal hernia 08/10/2013    Expected Discharge Date: Expected Discharge Date: 04/06/18  Team Members Present: Physician leading conference: Dr. Alger Simons Social Worker Present: Lennart Pall, LCSW Nurse Present: Dorthula Nettles, RN PT Present: Roderic Ovens, PT OT Present: Cherylynn Ridges, OT SLP Present: Weston Anna, SLP PPS Coordinator present : Gunnar Fusi     Current Status/Progress Goal Weekly Team Focus  Medical   pt with improving use of left arm, more spontaneous language. XRT begins today day 1/30. bp and cbg's improved  finalize medication regimen and dc planning  electrolytes, bp, cbg's, beginning xrt    Bowel/Bladder   Continent of bowel and bladder with episodes of incontiences at night; LBM 01/13  Manage bowl and bladder with min assist; maintain regualr bowel movement pattern  Assist with tolieting needs prn    Swallow/Nutrition/ Hydration             ADL's   Min A overall  Supervision  pt/family education, bathing/dressing, functional transfers, dc planning   Mobility   min A gait, supervision transfers  downgraded to min A for gait, min A stairs, supervision transfers  family ed, d/c planning   Communication   Supervision-Min A  Min A  RMT to maximize vocal intensity and coordination of phonation in order to [employer], higher-level word-finding strategies    Safety/Cognition/ Behavioral Observations  Supervision-Min A  Min A  safety and overall functional problem solving with basic-mildly complex tasks   Pain   c/o headache during the day; at night no c/o pain  maintain controlled pain level of >2  Assess pain q shift and prn    Skin   right crani incision   maintain skin free of new infection/breakdown  assess skin q shift and prn       *See Care Plan and progress notes for long and short-term goals.     Barriers to Discharge  Current Status/Progress Possible Resolutions Date Resolved   Physician    Medical stability        finalizing medical/onc plan for discharge      Nursing  PT                    OT                  SLP                SW                Discharge Planning/Teaching Needs:  Plan to d/c home with wife who can provide 24/7 assistance.  Local daughter also supportive.  Teaching has been ongoing.  Formal education to be completed tomorrow morning.   Team Discussion:  No new medical issues. Steroids have ended.  Rad treatment begins today.  Cont b/b and no new nursing concerns.  Increased use of left hand and incorporating into ADLs more consistently.  CGA for shower due to lateral lean.  Gait goal downgraded to min  assist due to lean as well.  Using foot up brace and therapists recommending shoes at all times when up walking.  Much improved overall with speech and word finding.  Revisions to Treatment Plan:  none    Continued Need for Acute Rehabilitation Level of Care: The patient requires daily medical management by a physician with specialized training in physical medicine and rehabilitation for the following conditions: Daily direction of a multidisciplinary physical rehabilitation program to ensure safe treatment while eliciting the highest outcome that is of practical value to the patient.: Yes Daily medical management of patient stability for increased activity during participation in an intensive rehabilitation regime.: Yes Daily analysis of laboratory values and/or radiology reports with any subsequent need for medication adjustment of medical intervention for : Post surgical problems;Other;Diabetes problems;Blood pressure problems   I attest that I was present, lead the team conference, and concur with the assessment and plan of the team.   Jenaveve Fenstermaker 04/05/2018, 10:25 AM

## 2018-04-04 NOTE — Progress Notes (Signed)
Occupational Therapy Session Note  Patient Details  Name: Bryan [name] MRN: [id number] Date of Birth: 23-Mar-1941  Today's Date: 04/04/2018 OT Individual Time: 1030-1130 OT Individual Time Calculation (min): 60 min    Short Term Goals: Week 1:  OT Short Term Goal 1 (Week 1): Pt will maintain sitting balance on toilet or BSC with supervision assist while voiding  OT Short Term Goal 1 - Progress (Week 1): Met OT Short Term Goal 2 (Week 1): Pt will complete sit<stand during LB self care with Min A  OT Short Term Goal 2 - Progress (Week 1): Met OT Short Term Goal 3 (Week 1): Pt will initiate 1 limb protection strategy during B/D for 1 OT session  OT Short Term Goal 3 - Progress (Week 1): Met Week 2:  OT Short Term Goal 1 (Week 2): Pt will use L UE for 50% of bathing tasks using velcro wash cloth OT Short Term Goal 1 - Progress (Week 2): Met OT Short Term Goal 2 (Week 2): Pt will maintain standing balance within BADL tasks with no more than min A OT Short Term Goal 2 - Progress (Week 2): Met OT Short Term Goal 3 (Week 2): Pt will complete toilet transfer with consistent min A OT Short Term Goal 3 - Progress (Week 2): Met Week 3:  OT Short Term Goal 1 (Week 3): LTG=STG 2/2 ELOS     Skilled Therapeutic Interventions/Progress Updates:    Pt seen this session with his wife present for LUE NMR.  Pt taken to gym and transferred to mat with CGA.  From sitting at [facility], pt worked on trunk rotation to L to reach to L with cues to not elevate his shoulder.  Pt practiced grasp and release of cones, cups, and small bean bags focusing on thumb abduction and prehension.  Also practiced shower stall transfers simulating the same set up that they have at home.  Discussed various options for the transfers. Overall pt needs min A to step into the shower to his L and CGA to his R.  Pt completed session and went to a meeting with his spouse.  Therapy Documentation Precautions:  Precautions Precautions:  Fall Precaution Comments: expressive aphasia, L hemiplgia, impulsive Restrictions Weight Bearing Restrictions: No  Pain: Pain Assessment Pain Scale: 0-10 Pain Score: 0-No pain Faces Pain Scale: No hurt    Therapy/Group: Individual Therapy  R[place] 04/04/2018, 12:25 PM

## 2018-04-04 NOTE — Discharge Summary (Signed)
Discharge summary job # [id number]

## 2018-04-04 NOTE — Progress Notes (Signed)
Physical Therapy Session Note  Patient Details  Name: Bryan [name] MRN: [id number] Date of Birth: 1940/05/15  Today's Date: 04/04/2018 PT Individual Time: 1400-1415 PT Individual Time Calculation (min): 15 min   Short Term Goals: Week 4:  PT Short Term Goal 1 (Week 4): = LTG  Skilled Therapeutic Interventions/Progress Updates:    pt reports he just rec'd accupuncture and would like to "relax before radiation".  Pt's family also requests PT not complete this treatment session.  Pt/family aware of importance of physical therapy but stress pt's need to rest before his radiation treatment today. Pt given HEP handout and verbalizes understanding. Pt left in bed with nursing present, needs at hand.  Therapy Documentation Precautions:  Precautions Precautions: Fall Precaution Comments: expressive aphasia, L hemiplgia, impulsive Restrictions Weight Bearing Restrictions: No General: PT Amount of Missed Time (min): 45 Minutes PT Missed Treatment Reason: Patient fatigue Pain: Pain Assessment Pain Score: 0-No pain   Therapy/Group: Individual Therapy  Ariellah Faust 04/04/2018, 3:01 PM

## 2018-04-04 NOTE — Progress Notes (Signed)
Occupational Therapy Session Note  Patient Details  Name: Bryan [name] MRN: [id number] Date of Birth: 1940/05/20  Today's Date: 04/04/2018 OT Individual Time: 0801-0900 OT Individual Time Calculation (min): 59 min   Short Term Goals: Week 3:  OT Short Term Goal 1 (Week 3): LTG=STG 2/2 ELOS  Skilled Therapeutic Interventions/Progress Updates:    Pt greeted semi-reclined in bed reporting need to go to the bathroom. Pt declined ambulation 2/2 urgency so pt completed stand-pivot bed>wc>toilet with overall min A and verbal cues+facilitation to correct lateral lean to the L. Pt voided bowel and bladder successfully, then worked on [employer]. Pt able to achieve R hip hike to complete tioleting using R Hand. Pt needed the support of the wall on the L side to maintain balance while leaning. Pt then ambulated 5 feet with Mod A using RW and mod/max A for turning to sit on tub bench 2/2 small base of support and max verbal cues to advance LLE. Pt able to don L wash mit, but needed CGA and verbal cues to maintain sitting balance throughout bathing 2/2 lateral lean to the L. Stand-pivot out of shower with CGA. Grooming tasks completed at the sink with verbal cues for use of L UE as a stabilizer. Dressing with CGA for sit<>stands and min A for standing balance when pulling up pants. Pt able to don socks with min A and increased time using figure 4 position. Pt left seated in wc at end of session with safety belt on and call bell in reach. No reports of pain or discomfort throughout session.   Therapy Documentation Precautions:  Precautions Precautions: Fall Precaution Comments: expressive aphasia, L hemiplgia, impulsive Restrictions Weight Bearing Restrictions: No Pain: Pain Assessment Pain Scale: 0-10 Pain Score: 0-No pain  Therapy/Group: Individual Therapy  Valma Cava 04/04/2018, 9:17 AM

## 2018-04-05 ENCOUNTER — Inpatient Hospital Stay (HOSPITAL_COMMUNITY): Payer: Medicare Other | Admitting: Physical Therapy

## 2018-04-05 ENCOUNTER — Inpatient Hospital Stay (HOSPITAL_COMMUNITY): Payer: Medicare Other

## 2018-04-05 ENCOUNTER — Inpatient Hospital Stay (HOSPITAL_COMMUNITY): Payer: Medicare Other | Admitting: Occupational Therapy

## 2018-04-05 ENCOUNTER — Ambulatory Visit
Admission: RE | Admit: 2018-04-05 | Discharge: 2018-04-05 | Disposition: A | Discharge status: Home / Self Care | Payer: Medicare Other | Source: Ambulatory Visit | Attending: Radiation Oncology | Admitting: Radiation Oncology

## 2018-04-05 ENCOUNTER — Inpatient Hospital Stay (HOSPITAL_COMMUNITY): Payer: Medicare Other | Admitting: Speech Pathology

## 2018-04-05 DIAGNOSIS — Z510 Encounter for antineoplastic radiation therapy: Secondary | ICD-10-CM | POA: Diagnosis not present

## 2018-04-05 DIAGNOSIS — C711 Malignant neoplasm of frontal lobe: Secondary | ICD-10-CM | POA: Diagnosis not present

## 2018-04-05 LAB — CBC
HCT: 30.5 % — ABNORMAL LOW (ref 39.0–52.0)
Hemoglobin: 10 g/dL — ABNORMAL LOW (ref 13.0–17.0)
MCH: 30.3 pg (ref 26.0–34.0)
MCHC: 32.8 g/dL (ref 30.0–36.0)
MCV: 92.4 fL (ref 80.0–100.0)
Platelets: 164 10*3/uL (ref 150–400)
RBC: 3.3 MIL/uL — ABNORMAL LOW (ref 4.22–5.81)
RDW: 13.4 % (ref 11.5–15.5)
WBC: 3.9 10*3/uL — ABNORMAL LOW (ref 4.0–10.5)
nRBC: 0 % (ref 0.0–0.2)

## 2018-04-05 LAB — GLUCOSE, CAPILLARY
Glucose-Capillary: 106 mg/dL — ABNORMAL HIGH (ref 70–99)
Glucose-Capillary: 114 mg/dL — ABNORMAL HIGH (ref 70–99)
Glucose-Capillary: 153 mg/dL — ABNORMAL HIGH (ref 70–99)
Glucose-Capillary: 109 mg/dL — ABNORMAL HIGH (ref 70–99)

## 2018-04-05 MED ORDER — ATORVASTATIN CALCIUM 40 MG PO TABS: 40.0000 mg | ORAL_TABLET | Freq: Every day | ORAL | 0 refills | Status: DC

## 2018-04-05 MED ORDER — LISINOPRIL 5 MG PO TABS: 15.0000 mg | ORAL_TABLET | Freq: Every day | ORAL | 1 refills | Status: DC

## 2018-04-05 MED ORDER — TEMOZOLOMIDE 20 MG PO CAPS
140.0000 mg | ORAL_CAPSULE | Freq: Every day | ORAL | Status: DC
  Administered 2018-04-05: 140 mg via ORAL

## 2018-04-05 MED ORDER — PANTOPRAZOLE SODIUM 40 MG PO TBEC: 40.0000 mg | DELAYED_RELEASE_TABLET | Freq: Every day | ORAL | 1 refills | Status: DC

## 2018-04-05 MED ORDER — LEVOTHYROXINE SODIUM 75 MCG PO TABS: 75.0000 ug | ORAL_TABLET | Freq: Every day | ORAL | 0 refills | Status: DC

## 2018-04-05 MED ORDER — LEVETIRACETAM 100 MG/ML PO SOLN: 1500.0000 mg | Freq: Two times a day (BID) | ORAL | 12 refills | Status: DC

## 2018-04-05 MED ORDER — METFORMIN HCL 1000 MG PO TABS: 1000.0000 mg | ORAL_TABLET | Freq: Two times a day (BID) | ORAL | 1 refills | Status: DC

## 2018-04-05 MED ORDER — ACETAMINOPHEN 325 MG PO TABS: 650.0000 mg | ORAL_TABLET | Freq: Four times a day (QID) | ORAL | Status: DC | PRN

## 2018-04-05 MED ORDER — ONDANSETRON HCL 8 MG PO TABS: 8.0000 mg | ORAL_TABLET | Freq: Two times a day (BID) | ORAL | 1 refills | Status: DC | PRN

## 2018-04-05 MED FILL — TEMOZOLOMIDE 140 MG CAPS: 140 | 28 days supply | Qty: 28 | Fill #0

## 2018-04-05 MED FILL — ONDANSETRON HCL 8 MG TABLET: 8 | 15 days supply | Qty: 30 | Fill #0

## 2018-04-05 NOTE — Telephone Encounter (Signed)
Oral Chemotherapy Pharmacist Encounter   Attempted to reach patient's wife, Juliann Pulse, and daughter, Cloyde Reams, to provide update and offer for initial counseling on oral medication: Temodar.  No answer. Left VM for call back.   Temodar is planned to be picked up from the [facility] today.  Johny Drilling, PharmD, BCPS, BCOP  04/05/2018   10:07 AM Oral Oncology Clinic [phone]

## 2018-04-05 NOTE — Plan of Care (Signed)
  Problem: Consults Goal: RH STROKE PATIENT EDUCATION Description: See Patient Education module for education specifics  Outcome: Progressing   Problem: RH BOWEL ELIMINATION Goal: RH STG MANAGE BOWEL WITH ASSISTANCE Description: STG Manage Bowel with min Assistance. Outcome: Progressing   Problem: RH BLADDER ELIMINATION Goal: RH STG MANAGE BLADDER WITH ASSISTANCE Description: STG Manage Bladder With min Assistance Outcome: Progressing   Problem: RH SKIN INTEGRITY Goal: RH STG MAINTAIN SKIN INTEGRITY WITH ASSISTANCE Description: STG Maintain Skin Integrity With min Assistance. Outcome: Progressing   

## 2018-04-05 NOTE — Progress Notes (Signed)
Occupational Therapy Session Note  Patient Details  Name: Bryan [name] MRN: [id number] Date of Birth: February 21, 1941  Today's Date: 04/05/2018 OT Individual Time: 0830-1000 OT Individual Time Calculation (min): 90 min    Short Term Goals: Week 3:  OT Short Term Goal 1 (Week 3): LTG=STG 2/2 ELOS  Skilled Therapeutic Interventions/Progress Updates:  Pt greeted semi-reclined in bed and agreeable to OT treatment session. Pt came to sitting EOB with supervision. Supervision stand-pivot to wc with verbal cues for LLE positioning prior to stand. Pt reported need to urinate. Stand-pivot to commode with supervision and CGA for balance when adjusting clothing. Stand-pivot commode>wc>tub bench>wc with close supervision and verbal cues for L body awareness and upright posture. Pt with improved sitting posture throughout bathing tasks today with pt able to correct lateral lean with verbal cues. Pt able to use L UE within bathing tasks at gross assist level. Pt able to recall hemi dressing techniques and complete UB dressing with set-up/upervision and increased time. Pt's spouse entered and was educated on UB and LB dressing strategies. Pt able to achieve figure 4 position to thread pants, don socks, and don shoes. Sit<>stand w/ supervision, but CGA for balance when reaching to pull up pants. Educated pt's spouse on dinning/doffing foot up brace. Had pt's spouse practice stand-pivot toilet transfers. Although pt can complete transfer with supervision, OT educated pt's wife on body mechanics and hand positioning to assist with transfer if necessary given continued radiation treatments. Provided pt with home exercise program for L hand using soft tan theraputty and small foam pieces. Went through exercises with pt and family. Pt and family feel ready for dc and stated they had no further questions for OT at this time. Pt left seated in wc with wife present and handoff to PT for next session.   Therapy  Documentation Precautions:  Precautions Precautions: Fall Precaution Comments: expressive aphasia, L hemiplgia, impulsive Restrictions Weight Bearing Restrictions: No Pain: Pain Assessment Pain Scale: 0-10 Pain Score: 0-No pain   Therapy/Group: Individual Therapy  Valma Cava 04/05/2018, 9:13 AM

## 2018-04-05 NOTE — Discharge Instructions (Signed)
Inpatient Rehab Discharge Instructions  [facility] Discharge date and time: No discharge date for patient encounter.   Activities/Precautions/ Functional Status: Activity: activity as tolerated Diet: diabetic diet Wound Care: keep wound clean and dry Functional status:  ___ No restrictions     ___ Walk up steps independently ___ 24/7 supervision/assistance   ___ Walk up steps with assistance ___ Intermittent supervision/assistance  ___ Bathe/dress independently ___ Walk with walker     _x__ Bathe/dress with assistance ___ Walk Independently    ___ Shower independently ___ Walk with assistance    ___ Shower with assistance ___ No alcohol     ___ Return to work/school ________    COMMUNITY REFERRALS UPON DISCHARGE:    Home Health:   PT    OT     ST                     Agency:  [facility]  Phone:  [phone]   Medical Equipment/Items Ordered:  Wheelchair, cushion, walker, commode                                                      Agency/Supplier:  [facility] @ [phone]    GENERAL COMMUNITY RESOURCES FOR PATIENT/FAMILY:  Support Groups:  Available via [facility] (flyer)       Special Instructions: No driving   My questions have been answered and I understand these instructions. I will adhere to these goals and the provided educational materials after my discharge from the hospital.  Patient/Caregiver Signature _______________________________ Date __________  Clinician Signature _______________________________________ Date __________  Please bring this form and your medication list with you to all your follow-up doctor's appointments.

## 2018-04-05 NOTE — Progress Notes (Signed)
Recreational Therapy Discharge Summary Patient Details  Name: Bryan [name] MRN: [id number] Date of Birth: 03-Oct-1940 Today's Date: 04/05/2018  Long term goals set: 1  Long term goals met: 1  Comments on progress toward goals: Pt made good progress during LOS and is ready is scheduled for discharge home with family to provide 24 hour supervision/assistance.  TR sessions/education focused on identifying leisure interests, completion of activity analysis with potential modifications & community reintegration.  Pt participated in a community outing at [employer] assist ambulatory and w/c level with verbal cues for safety and attending to the left.  Goal met.   Reasons for discharge: discharge from hospital Patient/family agrees with progress made and goals achieved: Yes  [name] 04/05/2018, 4:14 PM

## 2018-04-05 NOTE — Plan of Care (Signed)
  Problem: RH Balance Goal: LTG: Patient will maintain dynamic sitting balance (OT) Description LTG:  Patient will maintain dynamic sitting balance with assistance during activities of daily living (OT) 04/05/2018 1521 by Prisca Gearing, Daneen Schick, OT Outcome: Not Met (add Reason) Note:  Pt requires CGA for sitting balance 04/05/2018 1520 by Shondrika Hoque, Daneen Schick, OT Outcome: Not Met (add Reason) Note:  Pt requires CGA for sitting balance Goal: LTG Patient will maintain dynamic standing with ADLs (OT) Description LTG:  Patient will maintain dynamic standing balance with assist during activities of daily living (OT)  Outcome: Not Met (add Reason) Note:  Pt requires CGA up to min A for standing balance    Problem: RH Dressing Goal: LTG Patient will perform lower body dressing w/assist (OT) Description LTG: Patient will perform lower body dressing with assist, with/without cues in positioning using equipment (OT) Outcome: Not Met (add Reason) Note:  Pt requires CGA up to [place] for LB dressing

## 2018-04-05 NOTE — Progress Notes (Signed)
Social Work Patient ID: Bryan [name], male   DOB: Aug 26, 1940, 78 y.o.   MRN: [id number]   Wife in today to complete final family ed sessions.  Pt ready for d/c tomorrow.  Lytle Malburg, LCSW

## 2018-04-05 NOTE — Telephone Encounter (Signed)
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  Patient will likely continue on Temodar for maintenance treatment for 6-12 cycles after completion of concomitant phase.  Counseled on administration, dosing, side effects, monitoring, drug-food interactions, safe handling, storage, and disposal.  Patient will take Temodar 140mg  capsules, 1 capsule (140 mg), by mouth once daily, may take at bedtime and on an empty stomach to decrease nausea and vomiting.  Patient will take Temodar concurrent with radiation for 42 days straight.  Temodar and radiation start date: 04/04/2018   Patient will take Zofran 8mg  tablet, 1 tablet by mouth 30-60 min prior to Temodar dose to help decrease N/V once starting adjuvant therapy. Prophylactic Zofran will not be used at initiation of concurrent phase.  Zofran prescription has previously been sent to [facility], I sent a new Rx to [facility] while speaking with Mrs. Sibert.   Adverse effects include but are not limited to: nausea, vomiting, anorexia, GI upset, rash, drug fever, and fatigue. Rare but serious adverse effects of pneumocystis pneumonia and secondary malignancy also discussed.  PCP prophylaxis will not be initiated at this time, but may be added based on lymphocyte count in the future.  Reviewed with patient importance of keeping a medication schedule and plan for any missed doses.  Mrs. Kunzler voiced understanding and appreciation.   All questions answered. Medication reconciliation performed and medication/allergy list updated.  Mrs. Hillery is picking up patient's Temodar from the [facility] today. Copayment is being covered by foundation copayment assistance grant secured by oral oncology patient advocate.  Patient's insurance is only  allowing a 30-calendar day dispense of Temodar at a time so Mrs. Adamcik is receiving #28 of the 140mg  capsules today. The pharmacy will coordinate with Mrs. Amrhein for the remaining #14 capsules prior to completion of 1st dispense to ensure patient does not have a break inTemoodar administration.  Mr. Burkhead is currently admitted at [facility] with planned discharge to home on 04/06/2018.  Mrs. Spieker knows to call the office with questions or concerns. Oral Oncology Clinic will continue to follow.  Johny Drilling, PharmD, BCPS, BCOP  04/05/2018  4:14 PM Oral Oncology Clinic [phone]

## 2018-04-05 NOTE — Progress Notes (Signed)
Occupational Therapy Discharge Summary  Patient Details  Name: Bryan [name] MRN: [id number] Date of Birth: [birth year]  Patient has met 7 of 10 long term goals due to improved activity tolerance, improved balance, postural control, ability to compensate for deficits, functional use of  LEFT upper and LEFT lower extremity, improved attention, improved awareness and improved coordination.  Patient to discharge at overall Supervision/min A level.  Patient's care partner is independent to provide the necessary physical and cognitive assistance at discharge.    Reasons goals not met: Pt's goals upgraded last week 2/2 patient making such great progress with OT expecting pt to make the same functional gains this week to reach supervision level goals. Pt unfortunately did not continue progressing at the same pace 2/2 starting radiation treatments and fatigue. Pt requires min A for dynamic standing balance and CGA for static standing balance within BADL tasks. Pt aslo requires supervision/CGA for dynamic sitting balance 2/2 occasional lateral LOB to the L within BADLs.   Recommendation:  Patient will benefit from ongoing skilled OT services in home health setting to continue to advance functional skills in the area of BADL.  Equipment: wc, L 1/2 lap tray, 3-in-1 BSC  Reasons for discharge: treatment goals met and discharge from hospital  Patient/family agrees with progress made and goals achieved: Yes  OT Discharge Precautions/Restrictions  Precautions Precautions: Fall Precaution Comments: expressive aphasia, L hemiplgia, impulsive Restrictions Weight Bearing Restrictions: No ADL ADL Eating: Set up Grooming: Setup Where Assessed-Grooming: Wheelchair, Sitting at sink Upper Body Bathing: Supervision/safety Where Assessed-Upper Body Bathing: Wheelchair, Sitting at sink Lower Body Bathing: Supervision/safety Where Assessed-Lower Body Bathing: Wheelchair, Sitting at sink, Standing at sink Upper  Body Dressing: Supervision/safety Where Assessed-Upper Body Dressing: Sitting at sink, Wheelchair Lower Body Dressing: Contact guard, Setup Where Assessed-Lower Body Dressing: Standing at sink, Sitting at sink, Wheelchair Toileting: Supervision/safety Where Assessed-Toileting: Glass blower/designer: Close supervision Toilet Transfer Method: Arts development officer: Energy manager: Not assessed Perception  Inattention/Neglect: Does not attend to left side of body(improved since eval) Praxis Praxis: Intact Cognition Overall Cognitive Status: Impaired/Different from baseline Arousal/Alertness: Awake/alert Orientation Level: Oriented X4 Sustained Attention: Appears intact Memory: Impaired Awareness: Impaired Awareness Impairment: Emergent impairment Problem Solving: Impaired Problem Solving Impairment: Functional complex Behaviors: Impulsive Safety/Judgment: Impaired Sensation Sensation Light Touch: Appears Intact Proprioception Impaired Details: Impaired LUE;Impaired LLE Coordination Gross Motor Movements are Fluid and Coordinated: No Fine Motor Movements are Fluid and Coordinated: No Coordination and Movement Description: Lt hemiplegia  Motor  Motor Motor: Hemiplegia;Abnormal tone;Abnormal postural alignment and control Motor - Discharge Observations: Lt hemiplegia Mobility  Bed Mobility Bed Mobility: Rolling Right;Rolling Left;Supine to Sit;Sit to Supine Rolling Right: Independent Rolling Left: Independent Supine to Sit: Independent Transfers Sit to Stand: Supervision/Verbal cueing  Trunk/Postural Assessment  Cervical Assessment Cervical Assessment: Within Functional Limits Thoracic Assessment Thoracic Assessment: Within Functional Limits Lumbar Assessment Lumbar Assessment: (posterior pelvic tilt) Postural Control Postural Control: (difficultly with trunk control while sitting unsupported on toilet)  Balance Static Sitting  Balance Static Sitting - Level of Assistance: 7: Independent Dynamic Sitting Balance Dynamic Sitting - Level of Assistance: 5: Stand by assistance Static Standing Balance Static Standing - Level of Assistance: 5: Stand by assistance Dynamic Standing Balance Dynamic Standing - Level of Assistance: 4: Min assist Extremity/Trunk Assessment RUE Assessment RUE Assessment: Within Functional Limits LUE Assessment LUE Assessment: Exceptions to [facility] Active Range of Motion (AROM) Comments: active ranges at shoulder (about 120 degrees sh flexion) and elbow LUE Body System: Neuro  Brunstrum levels for arm and hand: Arm;Hand Brunstrum level for arm: Stage III Synergy is performed voluntarily Brunstrum level for hand: Stage III Synergies performed voluntarily   Valma Cava 04/05/2018, 3:32 PM

## 2018-04-05 NOTE — Progress Notes (Signed)
Speech Language Pathology Discharge Summary  Patient Details  Name: Bryan [name] MRN: [id number] Date of Birth: [birth year]  Today's Date: 04/05/2018 SLP Individual Time: 1100-1130 SLP Individual Time Calculation (min): 30 min   Skilled Therapeutic Interventions:   Skilled treatment session focused on completion of patient and family education with the patient's wife. SLP facilitated session by providing education in regards to the patient's current cognitive impairments and strategies to utilize at home to maximize recall of new information and overall safety. Patient's wife also educated on how to appropriately cue patient during RMT exercises. She verbalized and demonstrated understanding. Patient will discharge home tomorrow with family. Continue with current plan of care.   Patient has met 4 of 4 long term goals.  Patient to discharge at overall Supervision;Min level.   Reasons goals not met: N/A   Clinical Impression/Discharge Summary: Patient has made excellent gains and has met 4 of 4 LTGs this admission. Currently, patient requires overall supervision level verbal cues for word word-finding at the conversation level. However, intelligibility is impacted by decreased coordination of phonation on exhalation and a hoarse vocal quality.  Patient has been participating in RMT with overall Min A verbal cues. Patient also demonstrates improved cognititive functioning and requires overall Min A verbal cues for complex problem solving and supervision verbal cues for attention to tasks. Patient and family education is complete and patient will discharge home with 24 hour supervision. Patient would benefit from f/u home health SLP services to maximize his cognitive-linguistic functioning and reduce caregiver burden.   Care Partner:  Caregiver Able to Provide Assistance: Yes  Type of Caregiver Assistance: Physical;Cognitive  Recommendation:  24 hour supervision/assistance;Home Health SLP   Rationale for SLP Follow Up: Maximize functional communication;Maximize cognitive function and independence;Reduce caregiver burden   Equipment: N/A   Reasons for discharge: Treatment goals met;Discharged from hospital   Patient/Family Agrees with Progress Made and Goals Achieved: Yes    [place], [place] 04/05/2018, 12:25 PM

## 2018-04-05 NOTE — Progress Notes (Signed)
[place] PHYSICAL MEDICINE & REHABILITATION PROGRESS NOTE  Subjective/Complaints: Patient is up in bed.  In good spirits.  Says he got back at 4 PM from radiation yesterday.  Appetite continues to be solid.  Excited about getting home.  ROS: Patient denies fever, rash, sore throat, blurred vision, nausea, vomiting, diarrhea, cough, shortness of breath or chest pain, joint or back pain, headache, or mood change.     Objective: Vital Signs: Blood pressure 121/83, pulse 100, temperature 98.2 F (36.8 C), temperature source Oral, resp. rate 18, height 5\' 5"  (1.651 m), weight 78.2 kg, SpO2 93 %. No results found. Recent Labs    04/04/18 0729 04/05/18 0537  WBC 3.3* 3.9*  HGB 11.0* 10.0*  HCT 32.0* 30.5*  PLT 154 164   Recent Labs    04/04/18 0729  NA 132*  K 3.8  CL 99  CO2 25  GLUCOSE 156*  BUN 16  CREATININE 0.87  CALCIUM 8.3*    Physical Exam: BP 121/83 (BP Location: Left Arm)   Pulse 100   Temp 98.2 F (36.8 C) (Oral)   Resp 18   Ht 5\' 5"  (1.651 m)   Wt 78.2 kg   SpO2 93%   BMI 28.70 kg/m  Constitutional: No distress . Vital signs reviewed. HEENT: EOMI, oral membranes moist Neck: supple Cardiovascular: RRR without murmur. No JVD    Respiratory: CTA Bilaterally without wheezes or rales. Normal effort    GI: BS +, non-tender, non-distended  Musc: No edema or tenderness in extremities. Neurological: He is alert  planned and spontaneous language improving Motor: Left upper extremity: Shoulder abduction, elbow flexion/extension 3-4/5, handgrip   3 to 4/5--will initiate use of LUE with verbal/tactile cueing  Left lower extremity: 3 to+/5 proximal distally   Skin: Skin is warm and dry.  Scalp wound healed Psychiatric: In good spirits  Assessment/Plan: 1. 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  Physiatrist is providing  close team supervision and 24 hour management of active medical problems listed below.  Physiatrist and rehab team continue to assess barriers to discharge/monitor patient progress toward functional and medical goals  Care Tool:  Bathing    Body parts bathed by patient: Chest, Abdomen, Front perineal area, Right upper leg, Left upper leg, Face, Left arm, Right lower leg, Left lower leg, Right arm, Buttocks   Body parts bathed by helper: Buttocks     Bathing assist Assist Level: Minimal Assistance - Patient > 75%     Upper Body Dressing/Undressing Upper body dressing   What is the patient wearing?: Pull over shirt    Upper body assist Assist Level: Supervision/Verbal cueing    Lower Body Dressing/Undressing Lower body dressing      What is the patient wearing?: Underwear/pull up, Pants     Lower body assist Assist for lower body dressing: Minimal Assistance - Patient > 75%     Toileting Toileting    Toileting assist Assist for toileting: Minimal Assistance - Patient > 75%     Transfers Chair/bed transfer  Transfers assist     Chair/bed transfer assist level: Contact Guard/Touching assist     Locomotion Ambulation   Ambulation assist      Assist level: Minimal Assistance - Patient > 75% Assistive device: Walker-rolling Max distance: 80   Walk 10 feet activity   Assist     Assist level: Minimal Assistance - Patient > 75%  Assistive device: Walker-rolling   Walk 50 feet activity   Assist    Assist level: Minimal Assistance - Patient > 75% Assistive device: Walker-rolling    Walk 150 feet activity   Assist    Assist level: Minimal Assistance - Patient > 75% Assistive device: Walker-rolling    Walk 10 feet on uneven surface  activity   Assist Walk 10 feet on uneven surfaces activity did not occur: Safety/medical concerns         Wheelchair     Assist Will patient use wheelchair at discharge?: Yes Type of Wheelchair:  Manual    Wheelchair assist level: Supervision/Verbal cueing Max wheelchair distance: 150 ft(BLE)    Wheelchair 50 feet with 2 turns activity    Assist        Assist Level: Supervision/Verbal cueing   Wheelchair 150 feet activity     Assist     Assist Level: Supervision/Verbal cueing      Medical Problem List and Plan: 1.  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 Status post craniotomy 03/07/2018. Pathology positive for glioblastoma  -ELOS 1/15  -Patient to see Rehab MD/provider in the office for transitional care encounter in 1-2 weeks.   Decadron taper completed  -Radiation initiated yesterday at [facility]. 30 treatments planned 2.  DVT Prophylaxis/Anticoagulation: SCDs. 3. Pain Management: Tylenol as needed 4. Mood:  Provide emotional support 5. Neuropsych: This patient is not capable of making decisions on his own behalf. 6. Skin/Wound Care:  Routine skin checks    7. Fluids/Electrolytes/Nutrition:  Routine in and out's   I personally reviewed the patient's labs today.   8. Seizures.   Keppra maintained at 1500 twice daily  Consider addition of Vimpat if persistent breakthrough seizures  Controlled at present 9. Hypertension.   Lisinopril increased to 15 mg on 12/24  bp's improved with decreasing steroids.  Vitals:   04/04/18 1944 04/05/18 0527  BP: 124/68 121/83  Pulse: (!) 103 100  Resp: 19 18  Temp: 98.5 F (36.9 C) 98.2 F (36.8 C)  SpO2: 99% 93%  Well controlled 1/14 10.  Diabetes II:  Glucophage 1000 mg twice a day.       CBG (last 3)  Recent Labs    04/04/18 1628 04/04/18 2113 04/05/18 0634  GLUCAP 155* 111* 106*  blood sugars have normalized off steroids 11. Hyperlipidemia. Lipitor 12.  Hyponatremia   Sodium 133 12/30---> sodium 130 on 1/6--> 132  On 1/13        13.  Hypoalbuminemia  Supplement initiated on 12/23 14.  Acute blood loss anemia  Hemoglobin 11.8 on 1/6     15. Hypokalemia--3.8 1/13     LOS: 25 days A FACE TO FACE EVALUATION WAS PERFORMED  Meredith Staggers 04/05/2018, 8:56 AM

## 2018-04-05 NOTE — Progress Notes (Signed)
Physical Therapy Discharge Summary  Patient Details  Name: Bryan [name] MRN: [id number] Date of Birth: 03/26/1940  Today's Date: 04/05/2018 PT Individual Time: 1000-1100 PT Individual Time Calculation (min): 60 min   Session focused on pt/family education with pt's wife. Pt's wife performs hands on training for gait and transfers with RW with min A for gait, supervision for transfers and car transfer.  Pt's wife educated on and returns demo of w/c parts management and folding w/c for storage. Pt/wife extensively educated on need for hands on at all times with gait and to limit gait at home and rely on w/c for mobility.  Pt able to perform w/c mobility throughout unit in home and controlled environments with supervision.  Pt/wife perform gait with wife performing min A.  Stair negotiation with problem solving for home entry with pt/wife able to perform with min A with increased time and cuing.  PT strongly recommends pt/wife get a portable ramp and pt/wife were shown options for this.  Both verbalize understanding.  Educated both on energy conservation and safety at home, both verbalize understanding. Pt and wife state they feel ready for d/c home tomorrow.  Patient has met 8 of 9 long term goals due to improved activity tolerance, improved balance, increased strength, ability to compensate for deficits, functional use of  left upper extremity and left lower extremity, improved attention, improved awareness and improved coordination.  Patient to discharge at a wheelchair level [place].   Patient's care partner is independent to provide the necessary physical and cognitive assistance at discharge.  Reasons goals not met: pt requires  Min A for dynamic standing balance  Recommendation:  Patient will benefit from ongoing skilled PT services in home health setting to continue to advance safe functional mobility, address ongoing impairments in balance, gait, strength, and minimize fall  risk.  Equipment: w/c, RW  Reasons for discharge: treatment goals met and discharge from hospital  Patient/family agrees with progress made and goals achieved: Yes  PT Discharge Precautions/Restrictions Precautions Precautions: Fall Restrictions Weight Bearing Restrictions: No Pain Pain Assessment Pain Scale: 0-10 Pain Score: 0-No pain Faces Pain Scale: No hurt  Cognition Overall Cognitive Status: Impaired/Different from baseline Orientation Level: Oriented X4 Selective Attention: Impaired Selective Attention Impairment: Functional basic Awareness: Impaired Awareness Impairment: Emergent impairment Safety/Judgment: Impaired Sensation Sensation Light Touch: Appears Intact Proprioception Impaired Details: Impaired LUE;Impaired LLE Coordination Gross Motor Movements are Fluid and Coordinated: No Fine Motor Movements are Fluid and Coordinated: No Coordination and Movement Description: Lt hemiplegia  Motor  Motor Motor: Hemiplegia;Abnormal tone;Abnormal postural alignment and control Motor - Discharge Observations: Lt hemiplegia  Mobility Bed Mobility Rolling Right: Independent Rolling Left: Independent Supine to Sit: Independent Transfers Sit to Stand: Supervision/Verbal cueing Stand Pivot Transfers: Supervision/Verbal cueing Transfer (Assistive device): Rolling walker Locomotion  Gait Ambulation: Yes Gait Assistance: Minimal Assistance - Patient > 75% Gait Distance (Feet): 150 Feet Assistive device: Rolling walker Stairs / Additional Locomotion Stairs: Yes Stairs Assistance: Minimal Assistance - Patient > 75% Stair Management Technique: With walker Number of Stairs: 1 Curb: Minimal Assistance - Patient >75% Wheelchair Mobility Wheelchair Mobility: Yes Wheelchair Assistance: Chartered loss adjuster: Right upper extremity;Right lower extremity Wheelchair Parts Management: Needs assistance  Trunk/Postural Assessment  Cervical  Assessment Cervical Assessment: Within Functional Limits Thoracic Assessment Thoracic Assessment: Within Functional Limits Lumbar Assessment Lumbar Assessment: (posterior pelvic tilt) Postural Control Righting Reactions: delayed  Balance Static Sitting Balance Static Sitting - Level of Assistance: 7: Independent Dynamic Sitting Balance Dynamic Sitting - Level  of Assistance: 5: Stand by assistance Static Standing Balance Static Standing - Level of Assistance: 5: Stand by assistance Dynamic Standing Balance Dynamic Standing - Level of Assistance: 4: Min assist Extremity Assessment      RLE Assessment RLE Assessment: Within Functional Limits LLE Assessment General Strength Comments: grossly 3-/5    Bryan [name] 04/05/2018, 11:18 AM

## 2018-04-05 NOTE — Progress Notes (Signed)
Occupational Therapy Session Note  Patient Details  Name: Bryan [name] MRN: [id number] Date of Birth: October 31, 1940  Today's Date: 04/05/2018 OT Individual Time: 1130-1200 OT Individual Time Calculation (min): 30 min    Short Term Goals: Week 3:  OT Short Term Goal 1 (Week 3): LTG=STG 2/2 ELOS  Skilled Therapeutic Interventions/Progress Updates:    Session focused on L UE NMR and fine motor coordination. Pt sitting up in w/c with no c/o pain. Pt required HOH to grasp and release beads to thread bimanually at midline. Pt and family educated on NMR principles and use of bimanual coordination activities. Pt completed grasp and release with functional forward reaching with min manual facilitation. Pt was provided demo re self PROM and he returned demo with cueing only. Pt was left sitting up in w/c with all needs met.   Therapy Documentation Precautions:  Precautions Precautions: Fall Precaution Comments: expressive aphasia, L hemiplgia, impulsive Restrictions Weight Bearing Restrictions: No   Pain: Pain Assessment Pain Scale: 0-10 Pain Score: 0-No pain   Therapy/Group: Individual Therapy  Curtis Sites 04/05/2018, 12:05 PM

## 2018-04-06 ENCOUNTER — Ambulatory Visit
Admission: RE | Admit: 2018-04-06 | Discharge: 2018-04-06 | Disposition: A | Discharge status: Home / Self Care | Payer: Medicare Other | Source: Ambulatory Visit | Attending: Radiation Oncology | Admitting: Radiation Oncology

## 2018-04-06 DIAGNOSIS — C711 Malignant neoplasm of frontal lobe: Secondary | ICD-10-CM | POA: Diagnosis not present

## 2018-04-06 DIAGNOSIS — Z510 Encounter for antineoplastic radiation therapy: Secondary | ICD-10-CM | POA: Diagnosis not present

## 2018-04-06 LAB — GLUCOSE, CAPILLARY: Glucose-Capillary: 165 mg/dL — ABNORMAL HIGH (ref 70–99)

## 2018-04-06 MED ORDER — ONDANSETRON HCL 4 MG PO TABS
4.0000 mg | ORAL_TABLET | Freq: Four times a day (QID) | ORAL | Status: DC | PRN
  Administered 2018-04-06: 4 mg via ORAL
  Filled 2018-04-06 (×2): qty 1

## 2018-04-06 MED ORDER — ONDANSETRON HCL 4 MG PO TABS: 4.0000 mg | ORAL_TABLET | Freq: Four times a day (QID) | ORAL | 0 refills | Status: DC | PRN

## 2018-04-06 NOTE — Plan of Care (Signed)
  Problem: Consults Goal: RH STROKE PATIENT EDUCATION Description: See Patient Education module for education specifics  Outcome: Progressing   

## 2018-04-06 NOTE — Telephone Encounter (Signed)
Oral Oncology Patient Advocate Encounter  Confirmed with [place] that Temodar was picked up on 04/05/18 with a $0 copay using PANF grant.   [place] Patient [place] Phone [phone] Fax [phone]

## 2018-04-06 NOTE — Plan of Care (Signed)
  Problem: Consults Goal: RH STROKE PATIENT EDUCATION Description See Patient Education module for education specifics  Outcome: Progressing Goal: Nutrition Consult-if indicated Outcome: Progressing   Problem: RH BOWEL ELIMINATION Goal: RH STG MANAGE BOWEL WITH ASSISTANCE Description STG Manage Bowel with min Assistance.    Outcome: Progressing Goal: RH STG MANAGE BOWEL W/MEDICATION W/ASSISTANCE Description STG Manage Bowel with Medication with min Assistance.    Outcome: Progressing   Problem: RH BLADDER ELIMINATION Goal: RH STG MANAGE BLADDER WITH ASSISTANCE Description STG Manage Bladder With min  Assistance   Outcome: Progressing Goal: RH STG MANAGE BLADDER WITH MEDICATION WITH ASSISTANCE Description STG Manage Bladder With Medication With min Assistance.   Outcome: Progressing   Problem: RH SKIN INTEGRITY Goal: RH STG MAINTAIN SKIN INTEGRITY WITH ASSISTANCE Description STG Maintain Skin Integrity With min Assistance.  Outcome: Progressing  Healed surgical incision  Problem: RH SAFETY Goal: RH STG ADHERE TO SAFETY PRECAUTIONS W/ASSISTANCE/DEVICE Description STG Adhere to Safety Precautions With min Assistance/Device.   Outcome: Progressing  Bed/chair alarm, proper footwear, proper transfers Problem: RH PAIN MANAGEMENT Goal: RH STG PAIN MANAGED AT OR BELOW PT'S PAIN GOAL Description 3 or less  Outcome: Progressing  Assessing pain every shift and as needed, administering pain regimen as needed  Problem: RH KNOWLEDGE DEFICIT Goal: RH STG INCREASE KNOWLEDGE OF DIABETES Description Family will verbalize management of blood sugars and medications and importance of management of diet with minimal cues from staff.  Outcome: Progressing   Problem: Consults Goal: RH BRAIN INJURY PATIENT EDUCATION Description Description: See Patient Education module for eduction specifics mod I  Outcome: Progressing Goal: Skin Care Protocol Initiated - if Braden Score 18 or  less Description If consults are not indicated, leave blank or document N/A. Mod I  Outcome: Progressing Surgical incision healed Goal: Diabetes Guidelines if Diabetic/Glucose > 140 Description If diabetic or lab glucose is > 140 mg/dl - Initiate Diabetes/Hyperglycemia Guidelines & Document Interventions ; mod I  Outcome: Progressing  Managing diabetes by po meds/insulin while inpatient Problem: RH KNOWLEDGE DEFICIT BRAIN INJURY Goal: RH STG INCREASE KNOWLEDGE OF SELF CARE AFTER BRAIN INJURY Description Increase self care after brain injury mod I  Outcome: Progressing

## 2018-04-06 NOTE — Progress Notes (Signed)
Patient noted awake at this time with c/o nausea, pt received his first dose of his chemotherapy medication this shift. Pt  spitting small amounts of sputum. [name] Angiulli-PA notified, received new order for zofran 4mg  po every 6 hours as needed. Zofran administered as ordered, pt reports relief. Resting quietly at present. Declined to take synthroid this am.

## 2018-04-06 NOTE — Progress Notes (Signed)
[facility] PHYSICAL MEDICINE & REHABILITATION PROGRESS NOTE  Subjective/Complaints: Patient had a bit of a difficult night.  Suffered from some nausea related to his chemotherapy.  Otherwise feeling better now this morning and ready to go home.  ROS: Patient denies fever, rash, sore throat, blurred vision,   shortness of breath or chest pain, joint or back pain, headache, or mood change.    Objective: Vital Signs: Blood pressure (!) 106/59, pulse 93, temperature 99.7 F (37.6 C), temperature source Oral, resp. rate 18, height 5\' 5"  (1.651 m), weight 76.2 kg, SpO2 90 %. No results found. Recent Labs    04/04/18 0729 04/05/18 0537  WBC 3.3* 3.9*  HGB 11.0* 10.0*  HCT 32.0* 30.5*  PLT 154 164   Recent Labs    04/04/18 0729  NA 132*  K 3.8  CL 99  CO2 25  GLUCOSE 156*  BUN 16  CREATININE 0.87  CALCIUM 8.3*    Physical Exam: BP (!) 106/59 (BP Location: Right Arm)   Pulse 93   Temp 99.7 F (37.6 C) (Oral)   Resp 18   Ht 5\' 5"  (1.651 m)   Wt 76.2 kg   SpO2 90%   BMI 27.96 kg/m  Constitutional: No distress . Vital signs reviewed. HEENT: EOMI, oral membranes moist Neck: supple Cardiovascular: RRR without murmur. No JVD    Respiratory: CTA Bilaterally without wheezes or rales. Normal effort    GI: BS +, non-tender, non-distended  Musc: No edema or tenderness in extremities. Neurological: He is alert  language continues to improve almost 5-day Motor: Left upper extremity: Shoulder abduction, elbow flexion/extension 3-4/5, handgrip   3 to 4/5--will initiate use of LUE with verbal/tactile cueing  Left lower extremity: 3 to+/5 proximal distally--- initiating movements more with this as well Skin: Skin is warm and dry.  Scalp wound healed Psychiatric: In good spirits  Assessment/Plan: 1. Functional deficits secondary to right brain mass status post craniotomy which require 3+ hours per day of interdisciplinary therapy in a comprehensive inpatient rehab  setting.  Physiatrist is providing close team supervision and 24 hour management of active medical problems listed below.  Physiatrist and rehab team continue to assess barriers to discharge/monitor patient progress toward functional and medical goals  Care Tool:  Bathing    Body parts bathed by patient: Chest, Abdomen, Front perineal area, Right upper leg, Left upper leg, Face, Left arm, Right lower leg, Left lower leg, Right arm, Buttocks   Body parts bathed by helper: Buttocks     Bathing assist Assist Level: Supervision/Verbal cueing     Upper Body Dressing/Undressing Upper body dressing   What is the patient wearing?: Pull over shirt    Upper body assist Assist Level: Supervision/Verbal cueing    Lower Body Dressing/Undressing Lower body dressing      What is the patient wearing?: Pants     Lower body assist Assist for lower body dressing: Contact Guard/Touching assist     Toileting Toileting    Toileting assist Assist for toileting: Supervision/Verbal cueing     Transfers Chair/bed transfer  Transfers assist     Chair/bed transfer assist level: Supervision/Verbal cueing     Locomotion Ambulation   Ambulation assist      Assist level: Minimal Assistance - Patient > 75% Assistive device: Walker-rolling Max distance: 100   Walk 10 feet activity   Assist     Assist level: Minimal Assistance - Patient > 75% Assistive device: Walker-rolling   Walk 50 feet activity  Assist    Assist level: Minimal Assistance - Patient > 75% Assistive device: Walker-rolling    Walk 150 feet activity   Assist    Assist level: Minimal Assistance - Patient > 75% Assistive device: Walker-rolling    Walk 10 feet on uneven surface  activity   Assist Walk 10 feet on uneven surfaces activity did not occur: Safety/medical concerns         Wheelchair     Assist Will patient use wheelchair at discharge?: Yes Type of Wheelchair: Manual     Wheelchair assist level: Supervision/Verbal cueing Max wheelchair distance: 150 ft    Wheelchair 50 feet with 2 turns activity    Assist        Assist Level: Supervision/Verbal cueing   Wheelchair 150 feet activity     Assist     Assist Level: Supervision/Verbal cueing      Medical Problem List and Plan: 1.  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 Status post craniotomy 03/07/2018. Pathology positive for glioblastoma  -Discharge home today  -Patient to see Rehab MD/provider in the office for transitional care encounter in 1-2 weeks.   Decadron taper completed  -Radiation initiated yesterday at [facility]. 30 treatments planned  -Discussed the fact with patient that he needs to have something on his stomach prior to taking his chemo therapy drugs 2.  DVT Prophylaxis/Anticoagulation: SCDs. 3. Pain Management: Tylenol as needed 4. Mood:  Provide emotional support 5. Neuropsych: This patient is not capable of making decisions on his own behalf. 6. Skin/Wound Care:  Routine skin checks    7. Fluids/Electrolytes/Nutrition:  Routine in and out's   I personally reviewed the patient's labs today.   8. Seizures.   Keppra maintained at 1500 twice daily  Controlled at present 9. Hypertension.   Lisinopril increased to 15 mg on 12/24  bp's improved with decreasing steroids.  Vitals:   04/06/18 0600 04/06/18 0759  BP:  (!) 106/59  Pulse:  93  Resp:  18  Temp:    SpO2: 92% 90%  Well controlled 1/15 10.  Diabetes II:  Glucophage 1000 mg twice a day.       CBG (last 3)  Recent Labs    04/05/18 1632 04/05/18 2155 04/06/18 0630  GLUCAP 153* 109* 165*  blood sugars have improved immensely off steroids 11. Hyperlipidemia. Lipitor 12.  Hyponatremia   Sodium 133 12/30---> sodium 130 on 1/6--> 132  On 1/13        13.  Hypoalbuminemia  Supplement initiated on 12/23 14.  Acute blood loss anemia  Hemoglobin 10.0  on 1/14--- follow-up with oncology    15. Hypokalemia--3.8 1/13     LOS: 26 days A FACE TO FACE EVALUATION WAS PERFORMED  Meredith Staggers 04/06/2018, 8:49 AM

## 2018-04-06 NOTE — Progress Notes (Signed)
Social Work  Discharge Note  The overall goal for the admission was met for:   Discharge location: Yes - home with wife and family able to provide 24/7 assistance  Length of Stay: Yes - 26 ays  Discharge activity level: Yes - supervision to min assist overall  Home/community participation: Yes  Services provided included: MD, RD, PT, OT, SLP, RN, TR, Pharmacy and SW  Financial Services: Medicare and Private Insurance: [place]  Follow-up services arranged: Home Health: PT, OT, ST via [facility], DME: 18x18 lightweight w/c with 1/2 lap tray, cushion, rolling walker, 3n1 commode via [place] and Patient/Family has no preference for HH/DME agencies  Comments (or additional information):  Patient/Family verbalized understanding of follow-up arrangements: Yes  Individual responsible for coordination of the follow-up plan: pt/ wife  Confirmed correct DME delivered: Lennart Pall 04/06/2018    Kima Malenfant

## 2018-04-07 ENCOUNTER — Ambulatory Visit
Admission: RE | Admit: 2018-04-07 | Discharge: 2018-04-07 | Disposition: A | Discharge status: Home / Self Care | Payer: Medicare Other | Source: Ambulatory Visit | Attending: Radiation Oncology | Admitting: Radiation Oncology

## 2018-04-07 ENCOUNTER — Ambulatory Visit: Payer: Medicare Other

## 2018-04-07 DIAGNOSIS — Z510 Encounter for antineoplastic radiation therapy: Secondary | ICD-10-CM | POA: Diagnosis not present

## 2018-04-07 DIAGNOSIS — C711 Malignant neoplasm of frontal lobe: Secondary | ICD-10-CM | POA: Diagnosis not present

## 2018-04-07 MED FILL — LISINOPRIL 5 MG TABLET: 5 | 10 days supply | Qty: 30 | Fill #0

## 2018-04-07 MED FILL — ONDANSETRON HCL 4 MG TABLET: 4 | 5 days supply | Qty: 20 | Fill #0

## 2018-04-07 MED FILL — LEVETIRACETAM 100 MG/ML SOL: 100 | 15 days supply | Qty: 473 | Fill #0

## 2018-04-07 MED FILL — PANTOPRAZOLE SOD DR 40 MG T: 40 | 30 days supply | Qty: 30 | Fill #0

## 2018-04-08 ENCOUNTER — Ambulatory Visit
Admission: RE | Admit: 2018-04-08 | Discharge: 2018-04-08 | Disposition: A | Discharge status: Home / Self Care | Payer: Medicare Other | Source: Ambulatory Visit | Attending: Radiation Oncology | Admitting: Radiation Oncology

## 2018-04-08 ENCOUNTER — Telehealth: Payer: Self-pay | Admitting: Registered Nurse

## 2018-04-08 DIAGNOSIS — K219 Gastro-esophageal reflux disease without esophagitis: Secondary | ICD-10-CM | POA: Diagnosis not present

## 2018-04-08 DIAGNOSIS — Z87891 Personal history of nicotine dependence: Secondary | ICD-10-CM | POA: Diagnosis not present

## 2018-04-08 DIAGNOSIS — D630 Anemia in neoplastic disease: Secondary | ICD-10-CM | POA: Diagnosis not present

## 2018-04-08 DIAGNOSIS — G8194 Hemiplegia, unspecified affecting left nondominant side: Secondary | ICD-10-CM | POA: Diagnosis not present

## 2018-04-08 DIAGNOSIS — E039 Hypothyroidism, unspecified: Secondary | ICD-10-CM | POA: Diagnosis not present

## 2018-04-08 DIAGNOSIS — D496 Neoplasm of unspecified behavior of brain: Secondary | ICD-10-CM

## 2018-04-08 DIAGNOSIS — C711 Malignant neoplasm of frontal lobe: Secondary | ICD-10-CM | POA: Diagnosis not present

## 2018-04-08 DIAGNOSIS — I10 Essential (primary) hypertension: Secondary | ICD-10-CM | POA: Diagnosis not present

## 2018-04-08 DIAGNOSIS — E46 Unspecified protein-calorie malnutrition: Secondary | ICD-10-CM | POA: Diagnosis not present

## 2018-04-08 DIAGNOSIS — E785 Hyperlipidemia, unspecified: Secondary | ICD-10-CM | POA: Diagnosis not present

## 2018-04-08 DIAGNOSIS — Z483 Aftercare following surgery for neoplasm: Secondary | ICD-10-CM | POA: Diagnosis not present

## 2018-04-08 DIAGNOSIS — E7849 Other hyperlipidemia: Secondary | ICD-10-CM | POA: Diagnosis not present

## 2018-04-08 DIAGNOSIS — Z9181 History of falling: Secondary | ICD-10-CM | POA: Diagnosis not present

## 2018-04-08 DIAGNOSIS — R4701 Aphasia: Secondary | ICD-10-CM | POA: Diagnosis not present

## 2018-04-08 DIAGNOSIS — Z510 Encounter for antineoplastic radiation therapy: Secondary | ICD-10-CM | POA: Diagnosis not present

## 2018-04-08 DIAGNOSIS — M1990 Unspecified osteoarthritis, unspecified site: Secondary | ICD-10-CM | POA: Diagnosis not present

## 2018-04-08 DIAGNOSIS — C719 Malignant neoplasm of brain, unspecified: Secondary | ICD-10-CM | POA: Diagnosis not present

## 2018-04-08 DIAGNOSIS — E038 Other specified hypothyroidism: Secondary | ICD-10-CM | POA: Diagnosis not present

## 2018-04-08 DIAGNOSIS — R569 Unspecified convulsions: Secondary | ICD-10-CM | POA: Diagnosis not present

## 2018-04-08 MED ORDER — SONAFINE EX EMUL
1.0000 "application " | Freq: Two times a day (BID) | CUTANEOUS | Status: DC
  Administered 2018-04-08: 1 via TOPICAL

## 2018-04-08 NOTE — Telephone Encounter (Signed)
Transitional Care call          Transitional Care Call Completed  Transitional Care Call Questions      Answered by Wife  Patient name: Bryan [name]  DOB: [birth year] 1. Are you/is patient experiencing any problems since coming home? No a. Are there any questions regarding any aspect of care? No 2. Are there any questions regarding medications administration/dosing? No a. Are meds being taken as prescribed? Yes. Mrs. Massie Bougie asked about the feeding supplement, AVS was reviewed and Medication List it's not listed. Will send a message to [facility], she verbalizes understanding.  b. "Patient should review meds with caller to confirm" Medication List Reviewed 3. Have there been any falls? No, he has had a few near misses wife reports.  4. Has Home Health been to the house and/or have they contacted you? Yes, [facility] a. If not, have you tried to contact them? NA b. Can we help you contact them? NA 5. Are bowels and bladder emptying properly? Yes a. Are there any unexpected incontinence issues? No b. If applicable, is patient following bowel/bladder programs? NA 6. Any fevers, problems with breathing, unexpected pain? No 7. Are there any skin problems or new areas of breakdown? No 8. Has the patient/family member arranged specialty MD follow up (ie cardiology/neurology/renal/surgical/etc.)?  Mrs. Salts is scheduling follow up appointments.  a. Can we help arrange? NA 9. Does the patient need any other services or support that we can help arrange? No 10. Are caregivers following through as expected in assisting the patient? Yes 11. Has the patient quit smoking, drinking alcohol, or using drugs as recommended? Mrs. Rudean Hitt reports Mr. Caterino doesn't smoke, drink alcohol or use illicit drugs.   Appointment date/time 04/19/2018  arrival time 11:00 for 11:20 Appointment with Dr. Naaman Plummer. At [place]

## 2018-04-11 ENCOUNTER — Ambulatory Visit
Admission: RE | Admit: 2018-04-11 | Discharge: 2018-04-11 | Disposition: A | Discharge status: Home / Self Care | Payer: Medicare Other | Source: Ambulatory Visit | Attending: Radiation Oncology | Admitting: Radiation Oncology

## 2018-04-11 DIAGNOSIS — I10 Essential (primary) hypertension: Secondary | ICD-10-CM | POA: Diagnosis not present

## 2018-04-11 DIAGNOSIS — C711 Malignant neoplasm of frontal lobe: Secondary | ICD-10-CM | POA: Diagnosis not present

## 2018-04-11 DIAGNOSIS — M1990 Unspecified osteoarthritis, unspecified site: Secondary | ICD-10-CM | POA: Diagnosis not present

## 2018-04-11 DIAGNOSIS — D630 Anemia in neoplastic disease: Secondary | ICD-10-CM | POA: Diagnosis not present

## 2018-04-11 DIAGNOSIS — Z510 Encounter for antineoplastic radiation therapy: Secondary | ICD-10-CM | POA: Diagnosis not present

## 2018-04-11 DIAGNOSIS — Z483 Aftercare following surgery for neoplasm: Secondary | ICD-10-CM | POA: Diagnosis not present

## 2018-04-11 DIAGNOSIS — C719 Malignant neoplasm of brain, unspecified: Secondary | ICD-10-CM | POA: Diagnosis not present

## 2018-04-11 DIAGNOSIS — G8194 Hemiplegia, unspecified affecting left nondominant side: Secondary | ICD-10-CM | POA: Diagnosis not present

## 2018-04-12 ENCOUNTER — Ambulatory Visit
Admission: RE | Admit: 2018-04-12 | Discharge: 2018-04-12 | Disposition: A | Discharge status: Home / Self Care | Payer: Medicare Other | Source: Ambulatory Visit | Attending: Radiation Oncology | Admitting: Radiation Oncology

## 2018-04-12 DIAGNOSIS — C711 Malignant neoplasm of frontal lobe: Secondary | ICD-10-CM | POA: Diagnosis not present

## 2018-04-12 DIAGNOSIS — Z510 Encounter for antineoplastic radiation therapy: Secondary | ICD-10-CM | POA: Diagnosis not present

## 2018-04-13 ENCOUNTER — Ambulatory Visit
Admission: RE | Admit: 2018-04-13 | Discharge: 2018-04-13 | Disposition: A | Discharge status: Home / Self Care | Payer: Medicare Other | Source: Ambulatory Visit | Attending: Radiation Oncology | Admitting: Radiation Oncology

## 2018-04-13 DIAGNOSIS — C711 Malignant neoplasm of frontal lobe: Secondary | ICD-10-CM | POA: Diagnosis not present

## 2018-04-13 DIAGNOSIS — Z510 Encounter for antineoplastic radiation therapy: Secondary | ICD-10-CM | POA: Diagnosis not present

## 2018-04-14 ENCOUNTER — Encounter: Payer: Self-pay | Admitting: Internal Medicine

## 2018-04-14 ENCOUNTER — Inpatient Hospital Stay (HOSPITAL_BASED_OUTPATIENT_CLINIC_OR_DEPARTMENT_OTHER): Payer: Medicare Other | Admitting: Internal Medicine

## 2018-04-14 ENCOUNTER — Ambulatory Visit (HOSPITAL_COMMUNITY)
Admission: RE | Admit: 2018-04-14 | Discharge: 2018-04-14 | Disposition: A | Discharge status: Home / Self Care | Payer: Medicare Other | Source: Ambulatory Visit | Attending: Internal Medicine | Admitting: Internal Medicine

## 2018-04-14 ENCOUNTER — Inpatient Hospital Stay: Payer: Medicare Other

## 2018-04-14 ENCOUNTER — Ambulatory Visit
Admission: RE | Admit: 2018-04-14 | Discharge: 2018-04-14 | Disposition: A | Discharge status: Home / Self Care | Payer: Medicare Other | Source: Ambulatory Visit | Attending: Radiation Oncology | Admitting: Radiation Oncology

## 2018-04-14 VITALS — BP 126/66 | HR 115 | Temp 98.1°F | Resp 18 | Ht 65.0 in | Wt 160.0 lb

## 2018-04-14 DIAGNOSIS — M19012 Primary osteoarthritis, left shoulder: Secondary | ICD-10-CM | POA: Diagnosis not present

## 2018-04-14 DIAGNOSIS — Z87891 Personal history of nicotine dependence: Secondary | ICD-10-CM | POA: Diagnosis not present

## 2018-04-14 DIAGNOSIS — K219 Gastro-esophageal reflux disease without esophagitis: Secondary | ICD-10-CM

## 2018-04-14 DIAGNOSIS — Z9181 History of falling: Secondary | ICD-10-CM

## 2018-04-14 DIAGNOSIS — E079 Disorder of thyroid, unspecified: Secondary | ICD-10-CM | POA: Diagnosis not present

## 2018-04-14 DIAGNOSIS — M25512 Pain in left shoulder: Secondary | ICD-10-CM | POA: Insufficient documentation

## 2018-04-14 DIAGNOSIS — C711 Malignant neoplasm of frontal lobe: Secondary | ICD-10-CM

## 2018-04-14 DIAGNOSIS — Z79899 Other long term (current) drug therapy: Secondary | ICD-10-CM | POA: Diagnosis not present

## 2018-04-14 DIAGNOSIS — Z794 Long term (current) use of insulin: Secondary | ICD-10-CM

## 2018-04-14 DIAGNOSIS — C719 Malignant neoplasm of brain, unspecified: Secondary | ICD-10-CM | POA: Diagnosis not present

## 2018-04-14 DIAGNOSIS — Z923 Personal history of irradiation: Secondary | ICD-10-CM

## 2018-04-14 DIAGNOSIS — M1990 Unspecified osteoarthritis, unspecified site: Secondary | ICD-10-CM

## 2018-04-14 DIAGNOSIS — Z85828 Personal history of other malignant neoplasm of skin: Secondary | ICD-10-CM

## 2018-04-14 DIAGNOSIS — R471 Dysarthria and anarthria: Secondary | ICD-10-CM | POA: Diagnosis not present

## 2018-04-14 DIAGNOSIS — Q043 Other reduction deformities of brain: Secondary | ICD-10-CM

## 2018-04-14 DIAGNOSIS — Z510 Encounter for antineoplastic radiation therapy: Secondary | ICD-10-CM | POA: Diagnosis not present

## 2018-04-14 DIAGNOSIS — Z6827 Body mass index (BMI) 27.0-27.9, adult: Secondary | ICD-10-CM | POA: Diagnosis not present

## 2018-04-14 DIAGNOSIS — E119 Type 2 diabetes mellitus without complications: Secondary | ICD-10-CM

## 2018-04-14 DIAGNOSIS — I10 Essential (primary) hypertension: Secondary | ICD-10-CM | POA: Diagnosis not present

## 2018-04-14 DIAGNOSIS — E7800 Pure hypercholesterolemia, unspecified: Secondary | ICD-10-CM | POA: Diagnosis not present

## 2018-04-14 DIAGNOSIS — R112 Nausea with vomiting, unspecified: Secondary | ICD-10-CM | POA: Diagnosis not present

## 2018-04-14 DIAGNOSIS — G8190 Hemiplegia, unspecified affecting unspecified side: Secondary | ICD-10-CM | POA: Diagnosis not present

## 2018-04-14 LAB — CMP (CANCER CENTER ONLY)
ALT: 54 U/L — ABNORMAL HIGH (ref 0–44)
AST: 31 U/L (ref 15–41)
Albumin: 3.1 g/dL — ABNORMAL LOW (ref 3.5–5.0)
Alkaline Phosphatase: 65 U/L (ref 38–126)
Anion gap: 9 (ref 5–15)
BUN: 7 mg/dL — ABNORMAL LOW (ref 8–23)
CO2: 27 mmol/L (ref 22–32)
Calcium: 8.7 mg/dL — ABNORMAL LOW (ref 8.9–10.3)
Chloride: 102 mmol/L (ref 98–111)
Creatinine: 0.82 mg/dL (ref 0.61–1.24)
GFR, Est AFR Am: >60 mL/min (ref >60)
GFR, Estimated: >60 mL/min (ref >60)
Glucose, Bld: 170 mg/dL — ABNORMAL HIGH (ref 70–99)
Potassium: 4 mmol/L (ref 3.5–5.1)
Sodium: 138 mmol/L (ref 135–145)
TOTAL PROTEIN: 6.4 g/dL — AB (ref 6.5–8.1)
Total Bilirubin: 0.4 mg/dL (ref 0.3–1.2)

## 2018-04-14 LAB — CBC WITH DIFFERENTIAL (CANCER CENTER ONLY)
Abs Immature Granulocytes: 0.2 10*3/uL — ABNORMAL HIGH (ref 0.00–0.07)
BASOS ABS: 0.1 10*3/uL (ref 0.0–0.1)
Basophils Relative: 1 %
Eosinophils Absolute: 0.1 10*3/uL (ref 0.0–0.5)
Eosinophils Relative: 1 %
HCT: 32.4 % — ABNORMAL LOW (ref 39.0–52.0)
Hemoglobin: 10.8 g/dL — ABNORMAL LOW (ref 13.0–17.0)
Immature Granulocytes: 3 %
Lymphocytes Relative: 29 %
Lymphs Abs: 1.8 10*3/uL (ref 0.7–4.0)
MCH: 31.6 pg (ref 26.0–34.0)
MCHC: 33.3 g/dL (ref 30.0–36.0)
MCV: 94.7 fL (ref 80.0–100.0)
Monocytes Absolute: 0.5 10*3/uL (ref 0.1–1.0)
Monocytes Relative: 8 %
Neutro Abs: 3.6 10*3/uL (ref 1.7–7.7)
Neutrophils Relative %: 58 %
PLATELETS: 308 10*3/uL (ref 150–400)
RBC: 3.42 MIL/uL — AB (ref 4.22–5.81)
RDW: 14 % (ref 11.5–15.5)
WBC: 6.3 10*3/uL (ref 4.0–10.5)
nRBC: 0 % (ref 0.0–0.2)

## 2018-04-14 NOTE — Progress Notes (Signed)
[facility] at [place], [place] [address] [phone]   Interval Evaluation  Date of Service: 04/14/18 Patient Name: Bryan [name] Patient MRN: [id number] Patient DOB: 1940-12-20 Provider: Ventura Sellers, MD  Identifying Statement:  Bryan [name] is a 78 y.o. male with right frontal glioblastoma   Oncologic History:    Glioblastoma, IDH-wildtype ([place])    03/07/2018 Surgery    Craniotomy, resection by Dr. Tommi Rumps ([facility]).  Path is Glioblastoma    03/29/2018 -  Chemotherapy    The patient had [No matching medication found in this treatment plan]  for chemotherapy treatment.      Biomarkers:  MGMT Unknown.  IDH 1/2 Wild type.  EGFR Expressed  TERT Unknown   Interval History: Bryan [name] presents today for follow up, now in week 2 of radiation therapy.  He describes continued improvement in function of left arm and leg.  He continues ambulating with a walker, although "speed has increased".  His speech is more clear although he does have some word finding difficulty.  Did have one fall at home (provoked) which left him with discomfort in his shoulder.  Tylenol has not helped the pain.  No recent seizures. Otherwise no new or progressive neurologic deficits.  H+P (02/16/18) Bryan [name] 78 y.o. male presented after family noted drooping of the left side of his face yesterday.  In addition, he/they describe some cognitive changes, such as difficulty "finding words" in certain situations.  The patient also feels like his left leg is "lagging" a little behind his right lately.  He is walking on his own, however, and with no falls.  Denies headaches, seizures.   Medications: Current Outpatient Medications on File Prior to Visit  Medication Sig Dispense Refill  . atorvastatin (LIPITOR) 40 MG tablet Take 1 tablet (40 mg total) by mouth daily. 30 tablet 0  . fluticasone (FLONASE) 50 MCG/ACT nasal spray Place 1 spray into both  nostrils 2 (two) times daily.    [name] levETIRAcetam (KEPPRA) 100 MG/ML solution Take 15 mLs (1,500 mg total) by mouth 2 (two) times daily. 473 mL 12  . levothyroxine (SYNTHROID, LEVOTHROID) 75 MCG tablet Take 1 tablet (75 mcg total) by mouth daily before breakfast. 30 tablet 0  . lisinopril (PRINIVIL,ZESTRIL) 5 MG tablet Take 3 tablets (15 mg total) by mouth daily. 30 tablet 1  . metFORMIN (GLUCOPHAGE) 1000 MG tablet Take 1 tablet (1,000 mg total) by mouth 2 (two) times daily with a meal. 60 tablet 1  . ondansetron (ZOFRAN) 4 MG tablet Take 1 tablet (4 mg total) by mouth every 6 (six) hours as needed for nausea or vomiting. 20 tablet 0  . pantoprazole (PROTONIX) 40 MG tablet Take 1 tablet (40 mg total) by mouth daily. 30 tablet 1  . temozolomide (TEMODAR) 140 MG capsule Take 1 capsule (140 mg total) by mouth daily. May take on an empty stomach to decrease nausea & vomiting. 42 capsule 0  . acetaminophen (TYLENOL) 325 MG tablet Take 2 tablets (650 mg total) by mouth every 6 (six) hours as needed for mild pain. (Patient not taking: Reported on 04/14/2018)    . Flaxseed, Linseed, (FLAXSEED OIL PO) Take by mouth.    . Ginkgo Biloba Extract (GINKGO BILOBA MEMORY ENHANCER) 60 MG CAPS Take by mouth.    . Multiple Vitamins-Minerals (MULTI-DAY PLUS MINERALS) TABS Take 1 tablet by mouth daily.    . naphazoline-pheniramine (NAPHCON-A) 0.025-0.3 % ophthalmic solution Place 1 drop into both  eyes as needed for eye irritation or allergies.    . Omega-3 1000 MG CAPS Take by mouth.     No current facility-administered medications on file prior to visit.     Allergies:  Allergies  Allergen Reactions  . Other     Cats, pollen   Past Medical History:  Past Medical History:  Diagnosis Date  . Arthritis   . Brain cancer ([place])    Glioblastoma  . Diabetes mellitus without complication ([place])   . GERD (gastroesophageal reflux disease)   . Hypercholesterolemia   . Hypertension   . Thyroid disease    Past  Surgical History:  Past Surgical History:  Procedure Laterality Date  . CRANIOTOMY    . HERNIA REPAIR    . 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 Marital status: Married    Spouse name: Not on file  . Number of children: Not on file  . Years of education: Not on file  . Highest education level: Not on file  Occupational History  . Not on file  Social Needs  . Financial resource strain: Not on file  . Food insecurity:    Worry: Not on file    Inability: Not on file  . Transportation needs:    Medical: Not on file    Non-medical: Not on file  Tobacco Use  . Smoking status: Former Smoker    Packs/day: 0.25    Years: 3.00    Pack years: 0.75    Types: Cigarettes    Last attempt to quit: 03/23/1961    Years since quitting: 57.0  . Smokeless tobacco: Never Used  Substance and Sexual Activity  . Alcohol use: Yes    Alcohol/week: 1.0 standard drinks    Types: 1 Glasses of wine per week    Comment: weekly  . Drug use: Never  . Sexual activity: Not Currently  Lifestyle  . Physical activity:    Days per week: Not on file    Minutes per session: Not on file  . Stress: Not on file  Relationships  . Social connections:    Talks on phone: Not on file    Gets together: Not on file    Attends religious service: Not on file    Active member of club or organization: Not on file    Attends meetings of clubs or organizations: Not on file    Relationship status: Not on file  . Intimate partner violence:    Fear of current or ex partner: Not on file    Emotionally abused: Not on file    Physically abused: Not on file    Forced sexual activity: Not on file  Other Topics Concern  . Not on file  Social History Narrative   Married. Resides in [place].   Family History:  Family History  Problem Relation Age of Onset  . Diabetes Mellitus II Mother   . ALS Father     Review of  Systems: Constitutional: Denies fevers, chills or abnormal weight loss Eyes: Denies blurriness of vision Ears, nose, mouth, throat, and face: Denies mucositis or sore throat Respiratory: Denies cough, dyspnea or wheezes Cardiovascular: Denies palpitation, chest discomfort or lower extremity swelling Gastrointestinal:  Denies nausea, constipation, diarrhea GU: Denies dysuria or incontinence Skin: Denies abnormal skin rashes Neurological: Per HPI Musculoskeletal: Denies joint pain, back or neck discomfort. No decrease in ROM Behavioral/Psych: Denies  anxiety, disturbance in thought content, and mood instability  Physical Exam:  Value Min Max  Temp 97.3 F (36.3 C)Abnormal  98.7 F (37.1 C)  Pulse Rate 75 103Abnormal   Resp 15 18  BP: Systolic 98 [id number]   BP: Diastolic 64 77  SpO2 98 % 100 %   KPS: 70. General: Alert, cooperative, pleasant, in no acute distress Head: Craniotomy scar noted, dry and intact. EENT: No conjunctival injection or scleral icterus. Oral mucosa moist Lungs: Resp effort normal Cardiac: Regular rate and rhythm Abdomen: Soft, non-distended abdomen Skin: No rashes cyanosis or petechiae. Extremities: Pain and limited ROM in left shoulder  Neurologic Exam: Mental Status: Awake, alert, attentive to examiner. Oriented to self and environment. Language is fluent with intact comprehension.  Cranial Nerves: Visual acuity is grossly normal. Visual fields are full. Extra-ocular movements intact. No ptosis. Face is symmetric, tongue midline. Motor: Tone and bulk are normal. Power is 4+/5 in left arm and leg, with impaired fine coordination in left hand. Reflexes are symmetric, no pathologic reflexes present. Intact finger to nose bilaterally Sensory: Intact to light touch and temperature Gait: Deferred  Labs: I have reviewed the data as listed    Component Value Date/Time   NA 132 (L) 04/04/2018 0729   K 3.8 04/04/2018 0729   CL 99 04/04/2018 0729   CO2 25  04/04/2018 0729   GLUCOSE 156 (H) 04/04/2018 0729   BUN 16 04/04/2018 0729   CREATININE 0.87 04/04/2018 0729   CALCIUM 8.3 (L) 04/04/2018 0729   PROT 5.2 (L) 03/14/2018 0535   ALBUMIN 2.7 (L) 03/14/2018 0535   AST 18 03/14/2018 0535   ALT 29 03/14/2018 0535   ALKPHOS 34 (L) 03/14/2018 0535   BILITOT 0.5 03/14/2018 0535   GFRNONAA >60 04/04/2018 0729   GFRAA >60 04/04/2018 0729   Lab Results  Component Value Date   WBC 6.3 04/14/2018   NEUTROABS 3.6 04/14/2018   HGB 10.8 (L) 04/14/2018   HCT 32.4 (L) 04/14/2018   MCV 94.7 04/14/2018   PLT 308 04/14/2018     Assessment/Plan 1.  Glioblastoma, IDH-wildtype [facility])   Bryan [name] is clinically stable today, labs are within normal limits.  We recommended continuing treatment with a 6 week course of intensity-modulated radiation therapy, along with concurrent daily Temozolomide dosed at 67m/m2.    Chemotherapy should be held for the following:  ANC less than 1,000  Platelets less than 100,000  LFT or creatinine greater than 2x ULN  If clinical concerns/contraindications develop  Will check x-ray of left shoulder given symptoms noted after his fall.  He will return to clinic in 2 weeks as scheduled.  He should continue Keppra 10045mBID for seizure prevention.  We will arrange consult with [facility] completion of radiation and post-radiation MRI scan.  We appreciate the opportunity to participate in the care of Bryan [name]  The total time spent in the encounter was 25 minutes and more than 50% was on counseling and review of test results   ZaVentura SellersMD Medical Director of Neuro-Oncology [facility]1/23/20 1:55 PM

## 2018-04-15 ENCOUNTER — Ambulatory Visit
Admission: RE | Admit: 2018-04-15 | Discharge: 2018-04-15 | Disposition: A | Discharge status: Home / Self Care | Payer: Medicare Other | Source: Ambulatory Visit | Attending: Radiation Oncology | Admitting: Radiation Oncology

## 2018-04-15 DIAGNOSIS — Z483 Aftercare following surgery for neoplasm: Secondary | ICD-10-CM | POA: Diagnosis not present

## 2018-04-15 DIAGNOSIS — Z510 Encounter for antineoplastic radiation therapy: Secondary | ICD-10-CM | POA: Diagnosis not present

## 2018-04-15 DIAGNOSIS — C719 Malignant neoplasm of brain, unspecified: Secondary | ICD-10-CM | POA: Diagnosis not present

## 2018-04-15 DIAGNOSIS — G8194 Hemiplegia, unspecified affecting left nondominant side: Secondary | ICD-10-CM | POA: Diagnosis not present

## 2018-04-15 DIAGNOSIS — M1990 Unspecified osteoarthritis, unspecified site: Secondary | ICD-10-CM | POA: Diagnosis not present

## 2018-04-15 DIAGNOSIS — D630 Anemia in neoplastic disease: Secondary | ICD-10-CM | POA: Diagnosis not present

## 2018-04-15 DIAGNOSIS — C711 Malignant neoplasm of frontal lobe: Secondary | ICD-10-CM | POA: Diagnosis not present

## 2018-04-15 DIAGNOSIS — I10 Essential (primary) hypertension: Secondary | ICD-10-CM | POA: Diagnosis not present

## 2018-04-18 ENCOUNTER — Telehealth: Payer: Self-pay

## 2018-04-18 ENCOUNTER — Ambulatory Visit
Admission: RE | Admit: 2018-04-18 | Discharge: 2018-04-18 | Disposition: A | Discharge status: Home / Self Care | Payer: Medicare Other | Source: Ambulatory Visit | Attending: Radiation Oncology | Admitting: Radiation Oncology

## 2018-04-18 DIAGNOSIS — C711 Malignant neoplasm of frontal lobe: Secondary | ICD-10-CM | POA: Diagnosis not present

## 2018-04-18 DIAGNOSIS — Z510 Encounter for antineoplastic radiation therapy: Secondary | ICD-10-CM | POA: Diagnosis not present

## 2018-04-18 DIAGNOSIS — G8194 Hemiplegia, unspecified affecting left nondominant side: Secondary | ICD-10-CM | POA: Diagnosis not present

## 2018-04-18 DIAGNOSIS — C719 Malignant neoplasm of brain, unspecified: Secondary | ICD-10-CM | POA: Diagnosis not present

## 2018-04-18 DIAGNOSIS — I10 Essential (primary) hypertension: Secondary | ICD-10-CM | POA: Diagnosis not present

## 2018-04-18 DIAGNOSIS — Z483 Aftercare following surgery for neoplasm: Secondary | ICD-10-CM | POA: Diagnosis not present

## 2018-04-18 DIAGNOSIS — M1990 Unspecified osteoarthritis, unspecified site: Secondary | ICD-10-CM | POA: Diagnosis not present

## 2018-04-18 DIAGNOSIS — D630 Anemia in neoplastic disease: Secondary | ICD-10-CM | POA: Diagnosis not present

## 2018-04-18 NOTE — Telephone Encounter (Signed)
Per 1/23 patient already scheduled

## 2018-04-19 ENCOUNTER — Ambulatory Visit
Admission: RE | Admit: 2018-04-19 | Discharge: 2018-04-19 | Disposition: A | Discharge status: Home / Self Care | Payer: Medicare Other | Source: Ambulatory Visit | Attending: Radiation Oncology | Admitting: Radiation Oncology

## 2018-04-19 ENCOUNTER — Encounter: Payer: Self-pay | Admitting: Physical Medicine & Rehabilitation

## 2018-04-19 ENCOUNTER — Encounter: Payer: Medicare Other | Attending: Physical Medicine & Rehabilitation | Admitting: Physical Medicine & Rehabilitation

## 2018-04-19 VITALS — BP 125/73 | HR 106 | Ht 64.5 in | Wt 160.0 lb

## 2018-04-19 DIAGNOSIS — C711 Malignant neoplasm of frontal lobe: Secondary | ICD-10-CM | POA: Diagnosis not present

## 2018-04-19 DIAGNOSIS — C719 Malignant neoplasm of brain, unspecified: Secondary | ICD-10-CM

## 2018-04-19 DIAGNOSIS — Z510 Encounter for antineoplastic radiation therapy: Secondary | ICD-10-CM | POA: Diagnosis not present

## 2018-04-19 NOTE — Patient Instructions (Signed)
PLEASE FEEL FREE TO CALL OUR OFFICE WITH ANY PROBLEMS OR QUESTIONS ([phone])      WE CAN LOOK AT OUTPATIENT THERAPIES THIS April

## 2018-04-19 NOTE — Progress Notes (Signed)
Subjective:    Patient ID: Bryan [name], male    DOB: [birth year], 78 y.o.   MRN: [id number]   HPI   Bryan [name] is here for follow up of his right glioblastoma and left hemiparesis and language deficits. He is on week 3 of XRT. He has had some fatigue but is doing fairly well so far. There are 30 treatments scheduled.   He is largely using a walker at home with supervision. [place] therapy is coming out to the house and has had him outside at times.   His bowels and bladder are emptying regularly, and he's continent  He is checking sugars sporadically at home.   Mood has been good. Sleep is regular.  Pain levels are good. He developed some shoulder pain last week which has since resolved. He had an xray ordered, which I reviewed, which demonstrates mild OA     Pain Inventory Average Pain 0 Pain Right Now 0 My pain is na  In the last 24 hours, has pain interfered with the following? General activity 0 Relation with others 0 Enjoyment of life 0 What TIME of day is your pain at its worst? na Sleep (in general) na  Pain is worse with: na Pain improves with: na Relief from Meds: na  Mobility use a walker ability to climb steps?  no do you drive?  no  Function retired I need assistance with the following:  feeding, dressing, bathing, toileting, meal prep, household duties and shopping  Neuro/Psych No problems in this area  Prior Studies Any changes since last visit?  no  Physicians involved in your care Any changes since last visit?  no   Family History  Problem Relation Age of Onset  . Diabetes Mellitus II Mother   . ALS Father    Social History   Socioeconomic History  . Marital status: Married    Spouse name: Not on file  . Number of children: Not on file  . Years of education: Not on file  . Highest education level: Not on file  Occupational History  . Not on file  Social Needs  . Financial resource strain: Not on file  . Food insecurity:   Worry: Not on file    Inability: Not on file  . Transportation needs:    Medical: Not on file    Non-medical: Not on file  Tobacco Use  . Smoking status: Former Smoker    Packs/day: 0.25    Years: 3.00    Pack years: 0.75    Types: Cigarettes    Last attempt to quit: 03/23/1961    Years since quitting: 57.1  . Smokeless tobacco: Never Used  Substance and Sexual Activity  . Alcohol use: Yes    Alcohol/week: 1.0 standard drinks    Types: 1 Glasses of wine per week    Comment: weekly  . Drug use: Never  . Sexual activity: Not Currently  Lifestyle  . Physical activity:    Days per week: Not on file    Minutes per session: Not on file  . Stress: Not on file  Relationships  . Social connections:    Talks on phone: Not on file    Gets together: Not on file    Attends religious service: Not on file    Active member of club or organization: Not on file    Attends meetings of clubs or organizations: Not on file    Relationship status: Not on file  Other Topics Concern  .  Not on file  Social History Narrative   Married. Resides in [place].   Past Surgical History:  Procedure Laterality Date  . CRANIOTOMY    . HERNIA REPAIR    . 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 Arthritis   . Brain cancer ([place])    Glioblastoma  . Diabetes mellitus without complication ([place])   . GERD (gastroesophageal reflux disease)   . Hypercholesterolemia   . Hypertension   . Thyroid disease    BP 125/73   Pulse (!) 106   Ht 5' 4.5" (1.638 m)   Wt 160 lb (72.6 kg)   SpO2 95%   BMI 27.04 kg/m   Opioid Risk Score:   Fall Risk Score:  `1  Depression screen PHQ 2/9  No flowsheet data found.   Review of Systems  Constitutional: Negative.   HENT: Negative.   Eyes: Negative.   Respiratory: Negative.   Cardiovascular: Negative.   Gastrointestinal: Negative.   Endocrine: Negative.   Genitourinary: Negative.     Musculoskeletal: Positive for gait problem.  Skin: Negative.   Allergic/Immunologic: Negative.   Hematological: Negative.   Psychiatric/Behavioral: Negative.   All other systems reviewed and are negative.      Objective:   Physical Exam  General: Alert and oriented x 3, No apparent distress HEENT: Head is normocephalic, atraumatic, PERRLA, EOMI, sclera anicteric, oral mucosa pink and moist, dentition intact, ext ear canals clear,  Neck: Supple without JVD or lymphadenopathy Heart: Reg rate and rhythm. No murmurs rubs or gallops Chest: CTA bilaterally without wheezes, rales, or rhonchi; no distress Abdomen: Soft, non-tender, non-distended, bowel sounds positive. Extremities: No clubbing, cyanosis, or edema. Pulses are 2+ Skin: Clean and intact without signs of breakdown Neuro: Pt is cognitively appropriate with normal insight, memory, and awareness. Cranial nerves 2-12 are intact. Sensory exam is normal. Reflexes are 2+ in all 4's. Fine motor coordination is intact. No tremors. Motor function is grossly 5/5.  Musculoskeletal: Full ROM, No pain with AROM or PROM in the neck, trunk, or extremities. Posture appropriate Psych: Pt's affect is appropriate. Pt is cooperative         Assessment & Plan:  Medical Problem List and Plan: 1.Decreased functional mobility with left side weakness and dysarthriasecondary to right insular mass. Status post craniotomy 03/07/2018. Pathology positive for glioblastoma             -continue HH therapies            -consider outpt PT and OT a bit later once he's completed XRT and has a break from onc treatment             -continue walker/supervision  -discussed ongoing integration of left arm and leg in daily tasks.              -Radiation at [facility]. 30 treatments planned              2.   Pain Management:Tylenol as needed 3. Seizures.              Keppra maintained at 1500 twice daily              9. Hypertension.              bp under control.        10.  Diabetes II:  Glucophage 1000 mg twice a day.                  -  discussed cbg checks and improved control .  Fifteen minutes of face to face patient care time were spent during this visit. All questions were encouraged and answered.  .follow up in 2 months.

## 2018-04-20 ENCOUNTER — Ambulatory Visit
Admission: RE | Admit: 2018-04-20 | Discharge: 2018-04-20 | Disposition: A | Discharge status: Home / Self Care | Payer: Medicare Other | Source: Ambulatory Visit | Attending: Radiation Oncology | Admitting: Radiation Oncology

## 2018-04-20 DIAGNOSIS — M1990 Unspecified osteoarthritis, unspecified site: Secondary | ICD-10-CM | POA: Diagnosis not present

## 2018-04-20 DIAGNOSIS — C711 Malignant neoplasm of frontal lobe: Secondary | ICD-10-CM | POA: Diagnosis not present

## 2018-04-20 DIAGNOSIS — I10 Essential (primary) hypertension: Secondary | ICD-10-CM | POA: Diagnosis not present

## 2018-04-20 DIAGNOSIS — Z483 Aftercare following surgery for neoplasm: Secondary | ICD-10-CM | POA: Diagnosis not present

## 2018-04-20 DIAGNOSIS — Z510 Encounter for antineoplastic radiation therapy: Secondary | ICD-10-CM | POA: Diagnosis not present

## 2018-04-20 DIAGNOSIS — D630 Anemia in neoplastic disease: Secondary | ICD-10-CM | POA: Diagnosis not present

## 2018-04-20 DIAGNOSIS — C719 Malignant neoplasm of brain, unspecified: Secondary | ICD-10-CM | POA: Diagnosis not present

## 2018-04-20 DIAGNOSIS — G8194 Hemiplegia, unspecified affecting left nondominant side: Secondary | ICD-10-CM | POA: Diagnosis not present

## 2018-04-20 MED FILL — LEVETIRACETAM 100 MG/ML SOL: 100 | 15 days supply | Qty: 473 | Fill #1

## 2018-04-21 ENCOUNTER — Ambulatory Visit
Admission: RE | Admit: 2018-04-21 | Discharge: 2018-04-21 | Disposition: A | Discharge status: Home / Self Care | Payer: Medicare Other | Source: Ambulatory Visit | Attending: Radiation Oncology | Admitting: Radiation Oncology

## 2018-04-21 DIAGNOSIS — Z510 Encounter for antineoplastic radiation therapy: Secondary | ICD-10-CM | POA: Diagnosis not present

## 2018-04-21 DIAGNOSIS — G8194 Hemiplegia, unspecified affecting left nondominant side: Secondary | ICD-10-CM | POA: Diagnosis not present

## 2018-04-21 DIAGNOSIS — M1990 Unspecified osteoarthritis, unspecified site: Secondary | ICD-10-CM | POA: Diagnosis not present

## 2018-04-21 DIAGNOSIS — C711 Malignant neoplasm of frontal lobe: Secondary | ICD-10-CM | POA: Diagnosis not present

## 2018-04-21 DIAGNOSIS — D630 Anemia in neoplastic disease: Secondary | ICD-10-CM | POA: Diagnosis not present

## 2018-04-21 DIAGNOSIS — C719 Malignant neoplasm of brain, unspecified: Secondary | ICD-10-CM | POA: Diagnosis not present

## 2018-04-21 DIAGNOSIS — I10 Essential (primary) hypertension: Secondary | ICD-10-CM | POA: Diagnosis not present

## 2018-04-21 DIAGNOSIS — Z483 Aftercare following surgery for neoplasm: Secondary | ICD-10-CM | POA: Diagnosis not present

## 2018-04-22 ENCOUNTER — Ambulatory Visit
Admission: RE | Admit: 2018-04-22 | Discharge: 2018-04-22 | Disposition: A | Discharge status: Home / Self Care | Payer: Medicare Other | Source: Ambulatory Visit | Attending: Radiation Oncology | Admitting: Radiation Oncology

## 2018-04-22 DIAGNOSIS — C711 Malignant neoplasm of frontal lobe: Secondary | ICD-10-CM | POA: Diagnosis not present

## 2018-04-22 DIAGNOSIS — G8194 Hemiplegia, unspecified affecting left nondominant side: Secondary | ICD-10-CM | POA: Diagnosis not present

## 2018-04-22 DIAGNOSIS — M1990 Unspecified osteoarthritis, unspecified site: Secondary | ICD-10-CM | POA: Diagnosis not present

## 2018-04-22 DIAGNOSIS — I10 Essential (primary) hypertension: Secondary | ICD-10-CM | POA: Diagnosis not present

## 2018-04-22 DIAGNOSIS — D630 Anemia in neoplastic disease: Secondary | ICD-10-CM | POA: Diagnosis not present

## 2018-04-22 DIAGNOSIS — Z483 Aftercare following surgery for neoplasm: Secondary | ICD-10-CM | POA: Diagnosis not present

## 2018-04-22 DIAGNOSIS — Z510 Encounter for antineoplastic radiation therapy: Secondary | ICD-10-CM | POA: Diagnosis not present

## 2018-04-22 DIAGNOSIS — C719 Malignant neoplasm of brain, unspecified: Secondary | ICD-10-CM | POA: Diagnosis not present

## 2018-04-25 ENCOUNTER — Ambulatory Visit
Admission: RE | Admit: 2018-04-25 | Discharge: 2018-04-25 | Disposition: A | Discharge status: Home / Self Care | Payer: Medicare Other | Source: Ambulatory Visit | Attending: Radiation Oncology | Admitting: Radiation Oncology

## 2018-04-25 DIAGNOSIS — I10 Essential (primary) hypertension: Secondary | ICD-10-CM | POA: Diagnosis not present

## 2018-04-25 DIAGNOSIS — C719 Malignant neoplasm of brain, unspecified: Secondary | ICD-10-CM | POA: Diagnosis not present

## 2018-04-25 DIAGNOSIS — Z510 Encounter for antineoplastic radiation therapy: Secondary | ICD-10-CM | POA: Diagnosis not present

## 2018-04-25 DIAGNOSIS — D630 Anemia in neoplastic disease: Secondary | ICD-10-CM | POA: Diagnosis not present

## 2018-04-25 DIAGNOSIS — C711 Malignant neoplasm of frontal lobe: Secondary | ICD-10-CM | POA: Diagnosis not present

## 2018-04-25 DIAGNOSIS — Z483 Aftercare following surgery for neoplasm: Secondary | ICD-10-CM | POA: Diagnosis not present

## 2018-04-25 DIAGNOSIS — M1990 Unspecified osteoarthritis, unspecified site: Secondary | ICD-10-CM | POA: Diagnosis not present

## 2018-04-25 DIAGNOSIS — G8194 Hemiplegia, unspecified affecting left nondominant side: Secondary | ICD-10-CM | POA: Diagnosis not present

## 2018-04-26 ENCOUNTER — Ambulatory Visit
Admission: RE | Admit: 2018-04-26 | Discharge: 2018-04-26 | Disposition: A | Discharge status: Home / Self Care | Payer: Medicare Other | Source: Ambulatory Visit | Attending: Radiation Oncology | Admitting: Radiation Oncology

## 2018-04-26 DIAGNOSIS — C711 Malignant neoplasm of frontal lobe: Secondary | ICD-10-CM | POA: Diagnosis not present

## 2018-04-26 DIAGNOSIS — D630 Anemia in neoplastic disease: Secondary | ICD-10-CM | POA: Diagnosis not present

## 2018-04-26 DIAGNOSIS — C719 Malignant neoplasm of brain, unspecified: Secondary | ICD-10-CM | POA: Diagnosis not present

## 2018-04-26 DIAGNOSIS — G8194 Hemiplegia, unspecified affecting left nondominant side: Secondary | ICD-10-CM | POA: Diagnosis not present

## 2018-04-26 DIAGNOSIS — M1990 Unspecified osteoarthritis, unspecified site: Secondary | ICD-10-CM | POA: Diagnosis not present

## 2018-04-26 DIAGNOSIS — Z510 Encounter for antineoplastic radiation therapy: Secondary | ICD-10-CM | POA: Diagnosis not present

## 2018-04-26 DIAGNOSIS — Z483 Aftercare following surgery for neoplasm: Secondary | ICD-10-CM | POA: Diagnosis not present

## 2018-04-26 DIAGNOSIS — I10 Essential (primary) hypertension: Secondary | ICD-10-CM | POA: Diagnosis not present

## 2018-04-27 ENCOUNTER — Other Ambulatory Visit: Payer: Self-pay | Admitting: *Deleted

## 2018-04-27 ENCOUNTER — Ambulatory Visit
Admission: RE | Admit: 2018-04-27 | Discharge: 2018-04-27 | Disposition: A | Discharge status: Home / Self Care | Payer: Medicare Other | Source: Ambulatory Visit | Attending: Radiation Oncology | Admitting: Radiation Oncology

## 2018-04-27 ENCOUNTER — Telehealth: Payer: Self-pay | Admitting: *Deleted

## 2018-04-27 DIAGNOSIS — Z510 Encounter for antineoplastic radiation therapy: Secondary | ICD-10-CM | POA: Diagnosis not present

## 2018-04-27 DIAGNOSIS — C711 Malignant neoplasm of frontal lobe: Secondary | ICD-10-CM | POA: Diagnosis not present

## 2018-04-27 MED ORDER — ONDANSETRON HCL 4 MG PO TABS: 4.0000 mg | ORAL_TABLET | Freq: Four times a day (QID) | ORAL | 1 refills | Status: DC | PRN

## 2018-04-27 MED FILL — ONDANSETRON HCL 4 MG TABLET: 4 | 9 days supply | Qty: 30 | Fill #0

## 2018-04-27 NOTE — Telephone Encounter (Signed)
Wife called for refill of Zofran 4 mg tablets. They work as well as the 8 mg

## 2018-04-27 NOTE — Telephone Encounter (Signed)
"  Ishmail Mcmanamon [phone]) for my husband Lorain Keast requesting Ondansetron 4 mg refill.  Ordered by rehab provider as 4 mg."

## 2018-04-28 ENCOUNTER — Inpatient Hospital Stay: Payer: Medicare Other

## 2018-04-28 ENCOUNTER — Ambulatory Visit
Admission: RE | Admit: 2018-04-28 | Discharge: 2018-04-28 | Disposition: A | Discharge status: Home / Self Care | Payer: Medicare Other | Source: Ambulatory Visit | Attending: Radiation Oncology | Admitting: Radiation Oncology

## 2018-04-28 ENCOUNTER — Inpatient Hospital Stay: Payer: Medicare Other | Admitting: Internal Medicine

## 2018-04-28 DIAGNOSIS — Z483 Aftercare following surgery for neoplasm: Secondary | ICD-10-CM | POA: Diagnosis not present

## 2018-04-28 DIAGNOSIS — D630 Anemia in neoplastic disease: Secondary | ICD-10-CM | POA: Diagnosis not present

## 2018-04-28 DIAGNOSIS — G8194 Hemiplegia, unspecified affecting left nondominant side: Secondary | ICD-10-CM | POA: Diagnosis not present

## 2018-04-28 DIAGNOSIS — C711 Malignant neoplasm of frontal lobe: Secondary | ICD-10-CM | POA: Diagnosis not present

## 2018-04-28 DIAGNOSIS — C719 Malignant neoplasm of brain, unspecified: Secondary | ICD-10-CM | POA: Diagnosis not present

## 2018-04-28 DIAGNOSIS — M1990 Unspecified osteoarthritis, unspecified site: Secondary | ICD-10-CM | POA: Diagnosis not present

## 2018-04-28 DIAGNOSIS — Z510 Encounter for antineoplastic radiation therapy: Secondary | ICD-10-CM | POA: Diagnosis not present

## 2018-04-28 DIAGNOSIS — I10 Essential (primary) hypertension: Secondary | ICD-10-CM | POA: Diagnosis not present

## 2018-04-29 ENCOUNTER — Ambulatory Visit
Admission: RE | Admit: 2018-04-29 | Discharge: 2018-04-29 | Disposition: A | Discharge status: Home / Self Care | Payer: Medicare Other | Source: Ambulatory Visit | Attending: Radiation Oncology | Admitting: Radiation Oncology

## 2018-04-29 DIAGNOSIS — G8194 Hemiplegia, unspecified affecting left nondominant side: Secondary | ICD-10-CM | POA: Diagnosis not present

## 2018-04-29 DIAGNOSIS — M1990 Unspecified osteoarthritis, unspecified site: Secondary | ICD-10-CM | POA: Diagnosis not present

## 2018-04-29 DIAGNOSIS — D630 Anemia in neoplastic disease: Secondary | ICD-10-CM | POA: Diagnosis not present

## 2018-04-29 DIAGNOSIS — Z483 Aftercare following surgery for neoplasm: Secondary | ICD-10-CM | POA: Diagnosis not present

## 2018-04-29 DIAGNOSIS — I10 Essential (primary) hypertension: Secondary | ICD-10-CM | POA: Diagnosis not present

## 2018-04-29 DIAGNOSIS — Z510 Encounter for antineoplastic radiation therapy: Secondary | ICD-10-CM | POA: Diagnosis not present

## 2018-04-29 DIAGNOSIS — C711 Malignant neoplasm of frontal lobe: Secondary | ICD-10-CM | POA: Diagnosis not present

## 2018-04-29 DIAGNOSIS — C719 Malignant neoplasm of brain, unspecified: Secondary | ICD-10-CM | POA: Diagnosis not present

## 2018-05-02 ENCOUNTER — Inpatient Hospital Stay: Payer: Medicare Other | Attending: Internal Medicine

## 2018-05-02 ENCOUNTER — Ambulatory Visit
Admission: RE | Admit: 2018-05-02 | Discharge: 2018-05-02 | Disposition: A | Discharge status: Home / Self Care | Payer: Medicare Other | Source: Ambulatory Visit | Attending: Radiation Oncology | Admitting: Radiation Oncology

## 2018-05-02 ENCOUNTER — Inpatient Hospital Stay (HOSPITAL_BASED_OUTPATIENT_CLINIC_OR_DEPARTMENT_OTHER): Payer: Medicare Other | Admitting: Internal Medicine

## 2018-05-02 VITALS — BP 132/70 | HR 89 | Temp 97.8°F | Resp 18 | Ht 64.5 in | Wt 169.2 lb

## 2018-05-02 DIAGNOSIS — K219 Gastro-esophageal reflux disease without esophagitis: Secondary | ICD-10-CM | POA: Diagnosis not present

## 2018-05-02 DIAGNOSIS — E119 Type 2 diabetes mellitus without complications: Secondary | ICD-10-CM

## 2018-05-02 DIAGNOSIS — I10 Essential (primary) hypertension: Secondary | ICD-10-CM | POA: Diagnosis not present

## 2018-05-02 DIAGNOSIS — Z87891 Personal history of nicotine dependence: Secondary | ICD-10-CM | POA: Diagnosis not present

## 2018-05-02 DIAGNOSIS — C711 Malignant neoplasm of frontal lobe: Secondary | ICD-10-CM | POA: Diagnosis not present

## 2018-05-02 DIAGNOSIS — E7800 Pure hypercholesterolemia, unspecified: Secondary | ICD-10-CM | POA: Diagnosis not present

## 2018-05-02 DIAGNOSIS — Z923 Personal history of irradiation: Secondary | ICD-10-CM | POA: Diagnosis not present

## 2018-05-02 DIAGNOSIS — Z7984 Long term (current) use of oral hypoglycemic drugs: Secondary | ICD-10-CM

## 2018-05-02 DIAGNOSIS — Z85828 Personal history of other malignant neoplasm of skin: Secondary | ICD-10-CM | POA: Diagnosis not present

## 2018-05-02 DIAGNOSIS — Z9221 Personal history of antineoplastic chemotherapy: Secondary | ICD-10-CM | POA: Insufficient documentation

## 2018-05-02 DIAGNOSIS — Z79899 Other long term (current) drug therapy: Secondary | ICD-10-CM | POA: Diagnosis not present

## 2018-05-02 DIAGNOSIS — M1990 Unspecified osteoarthritis, unspecified site: Secondary | ICD-10-CM | POA: Insufficient documentation

## 2018-05-02 DIAGNOSIS — Z510 Encounter for antineoplastic radiation therapy: Secondary | ICD-10-CM | POA: Diagnosis not present

## 2018-05-02 LAB — CBC WITH DIFFERENTIAL (CANCER CENTER ONLY)
Abs Immature Granulocytes: 0.02 10*3/uL (ref 0.00–0.07)
Basophils Absolute: 0 10*3/uL (ref 0.0–0.1)
Basophils Relative: 1 %
Eosinophils Absolute: 0.2 10*3/uL (ref 0.0–0.5)
Eosinophils Relative: 4 %
HCT: 31 % — ABNORMAL LOW (ref 39.0–52.0)
Hemoglobin: 10.3 g/dL — ABNORMAL LOW (ref 13.0–17.0)
Immature Granulocytes: 1 %
Lymphocytes Relative: 20 %
Lymphs Abs: 0.9 10*3/uL (ref 0.7–4.0)
MCH: 32.2 pg (ref 26.0–34.0)
MCHC: 33.2 g/dL (ref 30.0–36.0)
MCV: 96.9 fL (ref 80.0–100.0)
Monocytes Absolute: 0.6 10*3/uL (ref 0.1–1.0)
Monocytes Relative: 13 %
Neutro Abs: 2.7 10*3/uL (ref 1.7–7.7)
Neutrophils Relative %: 61 %
Platelet Count: 192 10*3/uL (ref 150–400)
RBC: 3.2 MIL/uL — ABNORMAL LOW (ref 4.22–5.81)
RDW: 14.6 % (ref 11.5–15.5)
WBC: 4.4 10*3/uL (ref 4.0–10.5)
nRBC: 0 % (ref 0.0–0.2)

## 2018-05-02 LAB — CMP (CANCER CENTER ONLY)
ALT: 13 U/L (ref 0–44)
ANION GAP: 10 (ref 5–15)
AST: 14 U/L — ABNORMAL LOW (ref 15–41)
Albumin: 3.5 g/dL (ref 3.5–5.0)
Alkaline Phosphatase: 51 U/L (ref 38–126)
BUN: 7 mg/dL — ABNORMAL LOW (ref 8–23)
CO2: 24 mmol/L (ref 22–32)
Calcium: 8.8 mg/dL — ABNORMAL LOW (ref 8.9–10.3)
Chloride: 106 mmol/L (ref 98–111)
Creatinine: 0.84 mg/dL (ref 0.61–1.24)
GFR, Est AFR Am: >60 mL/min (ref >60)
GFR, Estimated: >60 mL/min (ref >60)
Glucose, Bld: 114 mg/dL — ABNORMAL HIGH (ref 70–99)
Potassium: 3.9 mmol/L (ref 3.5–5.1)
Sodium: 140 mmol/L (ref 135–145)
Total Bilirubin: 0.4 mg/dL (ref 0.3–1.2)
Total Protein: 6.1 g/dL — ABNORMAL LOW (ref 6.5–8.1)

## 2018-05-02 MED FILL — PANTOPRAZOLE SOD DR 40 MG T: 40 | 30 days supply | Qty: 30 | Fill #1

## 2018-05-02 MED FILL — TEMOZOLOMIDE 140 MG CAPS: 140 | 14 days supply | Qty: 14 | Fill #1

## 2018-05-02 MED FILL — LISINOPRIL 5 MG TABLET: 5 | 10 days supply | Qty: 30 | Fill #1

## 2018-05-02 NOTE — Progress Notes (Signed)
[facility] [place], [place] [id number] [phone]   Interval Evaluation  Date of Service: 05/02/18 Patient Name: Bryan [name] Patient MRN: [id number] Patient DOB: [birth year] Provider: Ventura Sellers, MD  Identifying Statement:  Bryan [name] is a 78 y.o. male with right frontal glioblastoma   Oncologic History:    Glioblastoma, IDH-wildtype ([place])    03/07/2018 Surgery    Craniotomy, resection by Dr. Tommi Rumps ([facility]).  Path is Glioblastoma    03/29/2018 -  Chemotherapy    The patient had [No matching medication found in this treatment plan]  for chemotherapy treatment.      Biomarkers:  MGMT Unknown.  IDH 1/2 Wild type.  EGFR Expressed  TERT Unknown   Interval History: Bryan [name] presents today for follow up, now through 4 weeks of radiation therapy.  He describes stability in function of left arm and leg.  He continues ambulating with a walker.  Still having difficulty with some words and names.  No recent seizures. Otherwise no new or progressive neurologic deficits.  H+P (02/16/18) Bryan [name] 78 y.o. male presented after family noted drooping of the left side of his face yesterday.  In addition, he/they describe some cognitive changes, such as difficulty "finding words" in certain situations.  The patient also feels like his left leg is "lagging" a little behind his right lately.  He is walking on his own, however, and with no falls.  Denies headaches, seizures.   Medications: Current Outpatient Medications on File Prior to Visit  Medication Sig Dispense Refill  . acetaminophen (TYLENOL) 325 MG tablet Take 2 tablets (650 mg total) by mouth every 6 (six) hours as needed for mild pain.    [name] atorvastatin (LIPITOR) 40 MG tablet Take 1 tablet (40 mg total) by mouth daily. 30 tablet 0  . Flaxseed, Linseed, (FLAXSEED OIL PO) Take by mouth.    . fluticasone (FLONASE) 50 MCG/ACT nasal spray Place 1 spray into both  nostrils 2 (two) times daily.    . Ginkgo Biloba Extract (GINKGO BILOBA MEMORY ENHANCER) 60 MG CAPS Take by mouth.    . levETIRAcetam (KEPPRA) 100 MG/ML solution Take 15 mLs (1,500 mg total) by mouth 2 (two) times daily. 473 mL 12  . levothyroxine (SYNTHROID, LEVOTHROID) 75 MCG tablet Take 1 tablet (75 mcg total) by mouth daily before breakfast. 30 tablet 0  . lisinopril (PRINIVIL,ZESTRIL) 5 MG tablet Take 3 tablets (15 mg total) by mouth daily. 30 tablet 1  . metFORMIN (GLUCOPHAGE) 1000 MG tablet Take 1 tablet (1,000 mg total) by mouth 2 (two) times daily with a meal. 60 tablet 1  . Multiple Vitamins-Minerals (MULTI-DAY PLUS MINERALS) TABS Take 1 tablet by mouth daily.    . naphazoline-pheniramine (NAPHCON-A) 0.025-0.3 % ophthalmic solution Place 1 drop into both eyes as needed for eye irritation or allergies.    . Omega-3 1000 MG CAPS Take by mouth.    . ondansetron (ZOFRAN) 4 MG tablet Take 1 tablet (4 mg total) by mouth every 6 (six) hours as needed for nausea or vomiting. 30 tablet 1  . pantoprazole (PROTONIX) 40 MG tablet Take 1 tablet (40 mg total) by mouth daily. 30 tablet 1  . temozolomide (TEMODAR) 140 MG capsule Take 1 capsule (140 mg total) by mouth daily. May take on an empty stomach to decrease nausea & vomiting. 42 capsule 0   No current facility-administered medications on file prior to visit.     Allergies:  Allergies  Allergen Reactions  . Other     Cats, pollen   Past Medical History:  Past Medical History:  Diagnosis Date  . Arthritis   . Brain cancer ([place])    Glioblastoma  . Diabetes mellitus without complication ([place])   . GERD (gastroesophageal reflux disease)   . Hypercholesterolemia   . Hypertension   . Thyroid disease    Past Surgical History:  Past Surgical History:  Procedure Laterality Date  . CRANIOTOMY    . HERNIA REPAIR    . 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 Marital status: Married    Spouse name: Not on file  . Number of children: Not on file  . Years of education: Not on file  . Highest education level: Not on file  Occupational History  . Not on file  Social Needs  . Financial resource strain: Not on file  . Food insecurity:    Worry: Not on file    Inability: Not on file  . Transportation needs:    Medical: Not on file    Non-medical: Not on file  Tobacco Use  . Smoking status: Former Smoker    Packs/day: 0.25    Years: 3.00    Pack years: 0.75    Types: Cigarettes    Last attempt to quit: 03/23/1961    Years since quitting: 57.1  . Smokeless tobacco: Never Used  Substance and Sexual Activity  . Alcohol use: Yes    Alcohol/week: 1.0 standard drinks    Types: 1 Glasses of wine per week    Comment: weekly  . Drug use: Never  . Sexual activity: Not Currently  Lifestyle  . Physical activity:    Days per week: Not on file    Minutes per session: Not on file  . Stress: Not on file  Relationships  . Social connections:    Talks on phone: Not on file    Gets together: Not on file    Attends religious service: Not on file    Active member of club or organization: Not on file    Attends meetings of clubs or organizations: Not on file    Relationship status: Not on file  . Intimate partner violence:    Fear of current or ex partner: Not on file    Emotionally abused: Not on file    Physically abused: Not on file    Forced sexual activity: Not on file  Other Topics Concern  . Not on file  Social History Narrative   Married. Resides in [place].   Family History:  Family History  Problem Relation Age of Onset  . Diabetes Mellitus II Mother   . ALS Father     Review of Systems: Constitutional: Denies fevers, chills or abnormal weight loss Eyes: Denies blurriness of vision Ears, nose, mouth, throat, and face: Denies mucositis or sore throat Respiratory: Denies cough, dyspnea or wheezes Cardiovascular:  Denies palpitation, chest discomfort or lower extremity swelling Gastrointestinal:  Denies nausea, constipation, diarrhea GU: Denies dysuria or incontinence Skin: Denies abnormal skin rashes Neurological: Per HPI Musculoskeletal: Denies joint pain, back or neck discomfort. No decrease in ROM Behavioral/Psych: Denies anxiety, disturbance in thought content, and mood instability  Physical Exam:  Value Min Max  Temp 97.3 F (36.3 C)Abnormal  98.7 F (37.1 C)  Pulse Rate 75 103Abnormal   Resp 15 18  BP: Systolic 98  [id number]   BP: Diastolic 64 77  SpO2 98 % 100 %   KPS: 70. General: Alert, cooperative, pleasant, in no acute distress Head: Craniotomy scar noted, dry and intact. EENT: No conjunctival injection or scleral icterus. Oral mucosa moist Lungs: Resp effort normal Cardiac: Regular rate and rhythm Abdomen: Soft, non-distended abdomen Skin: No rashes cyanosis or petechiae. Extremities: Pain and limited ROM in left shoulder  Neurologic Exam: Mental Status: Awake, alert, attentive to examiner. Oriented to self and environment. Language is fluent with intact comprehension.  Cranial Nerves: Visual acuity is grossly normal. Visual fields are full. Extra-ocular movements intact. No ptosis. Face is symmetric, tongue midline. Motor: Tone and bulk are normal. Power is 4+/5 in left arm and leg, with impaired fine coordination in left hand. Reflexes are symmetric, no pathologic reflexes present. Intact finger to nose bilaterally Sensory: Intact to light touch and temperature Gait: Deferred  Labs: I have reviewed the data as listed    Component Value Date/Time   NA 138 04/14/2018 1325   K 4.0 04/14/2018 1325   CL 102 04/14/2018 1325   CO2 27 04/14/2018 1325   GLUCOSE 170 (H) 04/14/2018 1325   BUN 7 (L) 04/14/2018 1325   CREATININE 0.82 04/14/2018 1325   CALCIUM 8.7 (L) 04/14/2018 1325   PROT 6.4 (L) 04/14/2018 1325   ALBUMIN 3.1 (L) 04/14/2018 1325   AST 31 04/14/2018 1325     ALT 54 (H) 04/14/2018 1325   ALKPHOS 65 04/14/2018 1325   BILITOT 0.4 04/14/2018 1325   GFRNONAA >60 04/14/2018 1325   GFRAA >60 04/14/2018 1325   Lab Results  Component Value Date   WBC 4.4 05/02/2018   NEUTROABS 2.7 05/02/2018   HGB 10.3 (L) 05/02/2018   HCT 31.0 (L) 05/02/2018   MCV 96.9 05/02/2018   PLT 192 05/02/2018     Assessment/Plan 1.  Glioblastoma, IDH-wildtype [facility])   Bryan [name] is clinically stable today, labs are within normal limits.  We recommended continuing treatment with a 6 week course of intensity-modulated radiation therapy, along with concurrent daily Temozolomide dosed at 26m/m2.    Chemotherapy should be held for the following:  ANC less than 1,000  Platelets less than 100,000  LFT or creatinine greater than 2x ULN  If clinical concerns/contraindications develop  He will return to clinic in 2 weeks as scheduled.  He should continue Keppra 10052mBID for seizure prevention.  We will arrange [facility] [place] and post-radiation MRI scan.  We appreciate the opportunity to participate in the care of Ed[place]erton [name]  The total time spent in the encounter was 25 minutes and more than 50% was on counseling and review of test results   ZaVentura SellersMD Medical Director of Neuro-Oncology [facility] [place]2/10/20 2:02 PM

## 2018-05-03 ENCOUNTER — Telehealth: Payer: Self-pay

## 2018-05-03 ENCOUNTER — Ambulatory Visit
Admission: RE | Admit: 2018-05-03 | Discharge: 2018-05-03 | Disposition: A | Discharge status: Home / Self Care | Payer: Medicare Other | Source: Ambulatory Visit | Attending: Radiation Oncology | Admitting: Radiation Oncology

## 2018-05-03 DIAGNOSIS — Z510 Encounter for antineoplastic radiation therapy: Secondary | ICD-10-CM | POA: Diagnosis not present

## 2018-05-03 DIAGNOSIS — I10 Essential (primary) hypertension: Secondary | ICD-10-CM | POA: Diagnosis not present

## 2018-05-03 DIAGNOSIS — C719 Malignant neoplasm of brain, unspecified: Secondary | ICD-10-CM | POA: Diagnosis not present

## 2018-05-03 DIAGNOSIS — G8194 Hemiplegia, unspecified affecting left nondominant side: Secondary | ICD-10-CM | POA: Diagnosis not present

## 2018-05-03 DIAGNOSIS — Z483 Aftercare following surgery for neoplasm: Secondary | ICD-10-CM | POA: Diagnosis not present

## 2018-05-03 DIAGNOSIS — D630 Anemia in neoplastic disease: Secondary | ICD-10-CM | POA: Diagnosis not present

## 2018-05-03 DIAGNOSIS — M1990 Unspecified osteoarthritis, unspecified site: Secondary | ICD-10-CM | POA: Diagnosis not present

## 2018-05-03 DIAGNOSIS — C711 Malignant neoplasm of frontal lobe: Secondary | ICD-10-CM | POA: Diagnosis not present

## 2018-05-03 NOTE — Telephone Encounter (Signed)
Per 2/10 no los

## 2018-05-04 ENCOUNTER — Ambulatory Visit
Admission: RE | Admit: 2018-05-04 | Discharge: 2018-05-04 | Disposition: A | Discharge status: Home / Self Care | Payer: Medicare Other | Source: Ambulatory Visit | Attending: Radiation Oncology | Admitting: Radiation Oncology

## 2018-05-04 DIAGNOSIS — C711 Malignant neoplasm of frontal lobe: Secondary | ICD-10-CM | POA: Diagnosis not present

## 2018-05-04 DIAGNOSIS — Z510 Encounter for antineoplastic radiation therapy: Secondary | ICD-10-CM | POA: Diagnosis not present

## 2018-05-04 MED FILL — LEVETIRACETAM 100 MG/ML SOL: 100 | 15 days supply | Qty: 473 | Fill #2

## 2018-05-05 ENCOUNTER — Ambulatory Visit
Admission: RE | Admit: 2018-05-05 | Discharge: 2018-05-05 | Disposition: A | Discharge status: Home / Self Care | Payer: Medicare Other | Source: Ambulatory Visit | Attending: Radiation Oncology | Admitting: Radiation Oncology

## 2018-05-05 DIAGNOSIS — D630 Anemia in neoplastic disease: Secondary | ICD-10-CM | POA: Diagnosis not present

## 2018-05-05 DIAGNOSIS — C719 Malignant neoplasm of brain, unspecified: Secondary | ICD-10-CM | POA: Diagnosis not present

## 2018-05-05 DIAGNOSIS — G8194 Hemiplegia, unspecified affecting left nondominant side: Secondary | ICD-10-CM | POA: Diagnosis not present

## 2018-05-05 DIAGNOSIS — Z483 Aftercare following surgery for neoplasm: Secondary | ICD-10-CM | POA: Diagnosis not present

## 2018-05-05 DIAGNOSIS — M1990 Unspecified osteoarthritis, unspecified site: Secondary | ICD-10-CM | POA: Diagnosis not present

## 2018-05-05 DIAGNOSIS — C711 Malignant neoplasm of frontal lobe: Secondary | ICD-10-CM | POA: Diagnosis not present

## 2018-05-05 DIAGNOSIS — Z510 Encounter for antineoplastic radiation therapy: Secondary | ICD-10-CM | POA: Diagnosis not present

## 2018-05-05 DIAGNOSIS — I10 Essential (primary) hypertension: Secondary | ICD-10-CM | POA: Diagnosis not present

## 2018-05-06 ENCOUNTER — Ambulatory Visit
Admission: RE | Admit: 2018-05-06 | Discharge: 2018-05-06 | Disposition: A | Discharge status: Home / Self Care | Payer: Medicare Other | Source: Ambulatory Visit | Attending: Radiation Oncology | Admitting: Radiation Oncology

## 2018-05-06 DIAGNOSIS — Z510 Encounter for antineoplastic radiation therapy: Secondary | ICD-10-CM | POA: Diagnosis not present

## 2018-05-06 DIAGNOSIS — C711 Malignant neoplasm of frontal lobe: Secondary | ICD-10-CM | POA: Diagnosis not present

## 2018-05-08 DIAGNOSIS — R4701 Aphasia: Secondary | ICD-10-CM | POA: Diagnosis not present

## 2018-05-08 DIAGNOSIS — E039 Hypothyroidism, unspecified: Secondary | ICD-10-CM | POA: Diagnosis not present

## 2018-05-08 DIAGNOSIS — I10 Essential (primary) hypertension: Secondary | ICD-10-CM | POA: Diagnosis not present

## 2018-05-08 DIAGNOSIS — K219 Gastro-esophageal reflux disease without esophagitis: Secondary | ICD-10-CM | POA: Diagnosis not present

## 2018-05-08 DIAGNOSIS — Z87891 Personal history of nicotine dependence: Secondary | ICD-10-CM | POA: Diagnosis not present

## 2018-05-08 DIAGNOSIS — M1990 Unspecified osteoarthritis, unspecified site: Secondary | ICD-10-CM | POA: Diagnosis not present

## 2018-05-08 DIAGNOSIS — R569 Unspecified convulsions: Secondary | ICD-10-CM | POA: Diagnosis not present

## 2018-05-08 DIAGNOSIS — Z9181 History of falling: Secondary | ICD-10-CM | POA: Diagnosis not present

## 2018-05-08 DIAGNOSIS — G8194 Hemiplegia, unspecified affecting left nondominant side: Secondary | ICD-10-CM | POA: Diagnosis not present

## 2018-05-08 DIAGNOSIS — E46 Unspecified protein-calorie malnutrition: Secondary | ICD-10-CM | POA: Diagnosis not present

## 2018-05-08 DIAGNOSIS — C719 Malignant neoplasm of brain, unspecified: Secondary | ICD-10-CM | POA: Diagnosis not present

## 2018-05-08 DIAGNOSIS — E785 Hyperlipidemia, unspecified: Secondary | ICD-10-CM | POA: Diagnosis not present

## 2018-05-08 DIAGNOSIS — D630 Anemia in neoplastic disease: Secondary | ICD-10-CM | POA: Diagnosis not present

## 2018-05-08 DIAGNOSIS — Z483 Aftercare following surgery for neoplasm: Secondary | ICD-10-CM | POA: Diagnosis not present

## 2018-05-09 ENCOUNTER — Ambulatory Visit
Admission: RE | Admit: 2018-05-09 | Discharge: 2018-05-09 | Disposition: A | Discharge status: Home / Self Care | Payer: Medicare Other | Source: Ambulatory Visit | Attending: Radiation Oncology | Admitting: Radiation Oncology

## 2018-05-09 DIAGNOSIS — C711 Malignant neoplasm of frontal lobe: Secondary | ICD-10-CM | POA: Diagnosis not present

## 2018-05-09 DIAGNOSIS — Z510 Encounter for antineoplastic radiation therapy: Secondary | ICD-10-CM | POA: Diagnosis not present

## 2018-05-10 ENCOUNTER — Ambulatory Visit
Admission: RE | Admit: 2018-05-10 | Discharge: 2018-05-10 | Disposition: A | Discharge status: Home / Self Care | Payer: Medicare Other | Source: Ambulatory Visit | Attending: Radiation Oncology | Admitting: Radiation Oncology

## 2018-05-10 DIAGNOSIS — C719 Malignant neoplasm of brain, unspecified: Secondary | ICD-10-CM | POA: Diagnosis not present

## 2018-05-10 DIAGNOSIS — Z510 Encounter for antineoplastic radiation therapy: Secondary | ICD-10-CM | POA: Diagnosis not present

## 2018-05-10 DIAGNOSIS — I10 Essential (primary) hypertension: Secondary | ICD-10-CM | POA: Diagnosis not present

## 2018-05-10 DIAGNOSIS — D630 Anemia in neoplastic disease: Secondary | ICD-10-CM | POA: Diagnosis not present

## 2018-05-10 DIAGNOSIS — Z483 Aftercare following surgery for neoplasm: Secondary | ICD-10-CM | POA: Diagnosis not present

## 2018-05-10 DIAGNOSIS — M1990 Unspecified osteoarthritis, unspecified site: Secondary | ICD-10-CM | POA: Diagnosis not present

## 2018-05-10 DIAGNOSIS — C711 Malignant neoplasm of frontal lobe: Secondary | ICD-10-CM | POA: Diagnosis not present

## 2018-05-10 DIAGNOSIS — G8194 Hemiplegia, unspecified affecting left nondominant side: Secondary | ICD-10-CM | POA: Diagnosis not present

## 2018-05-11 ENCOUNTER — Ambulatory Visit
Admission: RE | Admit: 2018-05-11 | Discharge: 2018-05-11 | Disposition: A | Discharge status: Home / Self Care | Payer: Medicare Other | Source: Ambulatory Visit | Attending: Radiation Oncology | Admitting: Radiation Oncology

## 2018-05-11 DIAGNOSIS — Z510 Encounter for antineoplastic radiation therapy: Secondary | ICD-10-CM | POA: Diagnosis not present

## 2018-05-11 DIAGNOSIS — C711 Malignant neoplasm of frontal lobe: Secondary | ICD-10-CM | POA: Diagnosis not present

## 2018-05-12 ENCOUNTER — Other Ambulatory Visit: Payer: Self-pay

## 2018-05-12 ENCOUNTER — Inpatient Hospital Stay (HOSPITAL_BASED_OUTPATIENT_CLINIC_OR_DEPARTMENT_OTHER): Payer: Medicare Other | Admitting: Internal Medicine

## 2018-05-12 ENCOUNTER — Ambulatory Visit
Admission: RE | Admit: 2018-05-12 | Discharge: 2018-05-12 | Disposition: A | Discharge status: Home / Self Care | Payer: Medicare Other | Source: Ambulatory Visit | Attending: Radiation Oncology | Admitting: Radiation Oncology

## 2018-05-12 ENCOUNTER — Inpatient Hospital Stay: Payer: Medicare Other

## 2018-05-12 ENCOUNTER — Encounter: Payer: Self-pay | Admitting: Internal Medicine

## 2018-05-12 VITALS — BP 132/70 | HR 84 | Temp 97.5°F | Resp 18 | Ht 64.5 in | Wt 165.5 lb

## 2018-05-12 DIAGNOSIS — C711 Malignant neoplasm of frontal lobe: Secondary | ICD-10-CM

## 2018-05-12 DIAGNOSIS — Z79899 Other long term (current) drug therapy: Secondary | ICD-10-CM | POA: Diagnosis not present

## 2018-05-12 DIAGNOSIS — E119 Type 2 diabetes mellitus without complications: Secondary | ICD-10-CM | POA: Diagnosis not present

## 2018-05-12 DIAGNOSIS — Z7984 Long term (current) use of oral hypoglycemic drugs: Secondary | ICD-10-CM

## 2018-05-12 DIAGNOSIS — E7800 Pure hypercholesterolemia, unspecified: Secondary | ICD-10-CM | POA: Diagnosis not present

## 2018-05-12 DIAGNOSIS — Z923 Personal history of irradiation: Secondary | ICD-10-CM

## 2018-05-12 DIAGNOSIS — K219 Gastro-esophageal reflux disease without esophagitis: Secondary | ICD-10-CM

## 2018-05-12 DIAGNOSIS — Z9221 Personal history of antineoplastic chemotherapy: Secondary | ICD-10-CM | POA: Diagnosis not present

## 2018-05-12 DIAGNOSIS — Z85828 Personal history of other malignant neoplasm of skin: Secondary | ICD-10-CM

## 2018-05-12 DIAGNOSIS — M1990 Unspecified osteoarthritis, unspecified site: Secondary | ICD-10-CM

## 2018-05-12 DIAGNOSIS — Z87891 Personal history of nicotine dependence: Secondary | ICD-10-CM

## 2018-05-12 DIAGNOSIS — Z510 Encounter for antineoplastic radiation therapy: Secondary | ICD-10-CM | POA: Diagnosis not present

## 2018-05-12 DIAGNOSIS — I10 Essential (primary) hypertension: Secondary | ICD-10-CM

## 2018-05-12 LAB — CBC WITH DIFFERENTIAL (CANCER CENTER ONLY)
Abs Immature Granulocytes: 0.02 10*3/uL (ref 0.00–0.07)
Basophils Absolute: 0 10*3/uL (ref 0.0–0.1)
Basophils Relative: 1 %
Eosinophils Absolute: 0.2 10*3/uL (ref 0.0–0.5)
Eosinophils Relative: 5 %
HEMATOCRIT: 32.1 % — AB (ref 39.0–52.0)
Hemoglobin: 10.6 g/dL — ABNORMAL LOW (ref 13.0–17.0)
Immature Granulocytes: 1 %
Lymphocytes Relative: 18 %
Lymphs Abs: 0.7 10*3/uL (ref 0.7–4.0)
MCH: 32.1 pg (ref 26.0–34.0)
MCHC: 33 g/dL (ref 30.0–36.0)
MCV: 97.3 fL (ref 80.0–100.0)
Monocytes Absolute: 0.6 10*3/uL (ref 0.1–1.0)
Monocytes Relative: 15 %
Neutro Abs: 2.6 10*3/uL (ref 1.7–7.7)
Neutrophils Relative %: 60 %
Platelet Count: 165 10*3/uL (ref 150–400)
RBC: 3.3 MIL/uL — ABNORMAL LOW (ref 4.22–5.81)
RDW: 14.5 % (ref 11.5–15.5)
WBC Count: 4.1 10*3/uL (ref 4.0–10.5)
nRBC: 0 % (ref 0.0–0.2)

## 2018-05-12 LAB — CMP (CANCER CENTER ONLY)
ALT: 12 U/L (ref 0–44)
AST: 13 U/L — ABNORMAL LOW (ref 15–41)
Albumin: 3.7 g/dL (ref 3.5–5.0)
Alkaline Phosphatase: 51 U/L (ref 38–126)
Anion gap: 10 (ref 5–15)
BUN: 8 mg/dL (ref 8–23)
CO2: 24 mmol/L (ref 22–32)
CREATININE: 0.91 mg/dL (ref 0.61–1.24)
Calcium: 9.2 mg/dL (ref 8.9–10.3)
Chloride: 106 mmol/L (ref 98–111)
GFR, Est AFR Am: >60 mL/min (ref >60)
GFR, Estimated: >60 mL/min (ref >60)
Glucose, Bld: 150 mg/dL — ABNORMAL HIGH (ref 70–99)
Potassium: 3.8 mmol/L (ref 3.5–5.1)
Sodium: 140 mmol/L (ref 135–145)
Total Bilirubin: 0.6 mg/dL (ref 0.3–1.2)
Total Protein: 6.2 g/dL — ABNORMAL LOW (ref 6.5–8.1)

## 2018-05-12 NOTE — Progress Notes (Signed)
[facility] at [place], [place] [address] [phone]   Interval Evaluation  Date of Service: 05/12/18 Patient Name: Bryan [name] Patient MRN: [id number] Patient DOB: Apr 27, 1940 Provider: Ventura Sellers, MD  Identifying Statement:  Bryan [name] is a 78 y.o. male with right frontal glioblastoma   Oncologic History:    Glioblastoma, IDH-wildtype ([place])    03/07/2018 Surgery    Craniotomy, resection by Dr. Tommi Rumps ([facility]).  Path is Glioblastoma    03/29/2018 -  Chemotherapy    The patient had [No matching medication found in this treatment plan]  for chemotherapy treatment.      Biomarkers:  MGMT Unknown.  IDH 1/2 Wild type.  EGFR Expressed  TERT Unknown   Interval History: Bryan [name] presents today for follow up, now through 6 weeks of radiation therapy.  He describes stability in function of left arm and leg.  He continues ambulating with a walker.  Still having difficulty with some words and names.  No recent seizures. Otherwise no new or progressive neurologic deficits.  H+P (02/16/18) Bryan [name] 78 y.o. male presented after family noted drooping of the left side of his face yesterday.  In addition, he/they describe some cognitive changes, such as difficulty "finding words" in certain situations.  The patient also feels like his left leg is "lagging" a little behind his right lately.  He is walking on his own, however, and with no falls.  Denies headaches, seizures.   Medications: Current Outpatient Medications on File Prior to Visit  Medication Sig Dispense Refill  . acetaminophen (TYLENOL) 325 MG tablet Take 2 tablets (650 mg total) by mouth every 6 (six) hours as needed for mild pain.    [name] atorvastatin (LIPITOR) 40 MG tablet Take 1 tablet (40 mg total) by mouth daily. 30 tablet 0  . levETIRAcetam (KEPPRA) 100 MG/ML solution Take 15 mLs (1,500 mg total) by mouth 2 (two) times daily. 473 mL 12  .  levothyroxine (SYNTHROID, LEVOTHROID) 75 MCG tablet Take 1 tablet (75 mcg total) by mouth daily before breakfast. 30 tablet 0  . lisinopril (PRINIVIL,ZESTRIL) 5 MG tablet Take 3 tablets (15 mg total) by mouth daily. 30 tablet 1  . metFORMIN (GLUCOPHAGE) 1000 MG tablet Take 1 tablet (1,000 mg total) by mouth 2 (two) times daily with a meal. 60 tablet 1  . naphazoline-pheniramine (NAPHCON-A) 0.025-0.3 % ophthalmic solution Place 1 drop into both eyes as needed for eye irritation or allergies.    [name] ondansetron (ZOFRAN) 4 MG tablet Take 1 tablet (4 mg total) by mouth every 6 (six) hours as needed for nausea or vomiting. 30 tablet 1  . pantoprazole (PROTONIX) 40 MG tablet Take 1 tablet (40 mg total) by mouth daily. 30 tablet 1  . Flaxseed, Linseed, (FLAXSEED OIL PO) Take by mouth.    . fluticasone (FLONASE) 50 MCG/ACT nasal spray Place 1 spray into both nostrils 2 (two) times daily.    . Ginkgo Biloba Extract (GINKGO BILOBA MEMORY ENHANCER) 60 MG CAPS Take by mouth.    . Multiple Vitamins-Minerals (MULTI-DAY PLUS MINERALS) TABS Take 1 tablet by mouth daily.    . Omega-3 1000 MG CAPS Take by mouth.     No current facility-administered medications on file prior to visit.     Allergies:  Allergies  Allergen Reactions  . Other     Cats, pollen   Past Medical History:  Past Medical History:  Diagnosis Date  . Arthritis   .  Brain cancer ([place])    Glioblastoma  . Diabetes mellitus without complication ([place])   . GERD (gastroesophageal reflux disease)   . Hypercholesterolemia   . Hypertension   . Thyroid disease    Past Surgical History:  Past Surgical History:  Procedure Laterality Date  . CRANIOTOMY    . HERNIA REPAIR    . 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 Marital status: Married    Spouse name: Not on file  . Number of children: Not on file  . Years of education: Not on file  . Highest  education level: Not on file  Occupational History  . Not on file  Social Needs  . Financial resource strain: Not on file  . Food insecurity:    Worry: Not on file    Inability: Not on file  . Transportation needs:    Medical: Not on file    Non-medical: Not on file  Tobacco Use  . Smoking status: Former Smoker    Packs/day: 0.25    Years: 3.00    Pack years: 0.75    Types: Cigarettes    Last attempt to quit: 03/23/1961    Years since quitting: 57.1  . Smokeless tobacco: Never Used  Substance and Sexual Activity  . Alcohol use: Yes    Alcohol/week: 1.0 standard drinks    Types: 1 Glasses of wine per week    Comment: weekly  . Drug use: Never  . Sexual activity: Not Currently  Lifestyle  . Physical activity:    Days per week: Not on file    Minutes per session: Not on file  . Stress: Not on file  Relationships  . Social connections:    Talks on phone: Not on file    Gets together: Not on file    Attends religious service: Not on file    Active member of club or organization: Not on file    Attends meetings of clubs or organizations: Not on file    Relationship status: Not on file  . Intimate partner violence:    Fear of current or ex partner: Not on file    Emotionally abused: Not on file    Physically abused: Not on file    Forced sexual activity: Not on file  Other Topics Concern  . Not on file  Social History Narrative   Married. Resides in [place].   Family History:  Family History  Problem Relation Age of Onset  . Diabetes Mellitus II Mother   . ALS Father     Review of Systems: Constitutional: Denies fevers, chills or abnormal weight loss Eyes: Denies blurriness of vision Ears, nose, mouth, throat, and face: Denies mucositis or sore throat Respiratory: Denies cough, dyspnea or wheezes Cardiovascular: Denies palpitation, chest discomfort or lower extremity swelling Gastrointestinal:  Denies nausea, constipation, diarrhea GU: Denies dysuria or  incontinence Skin: Denies abnormal skin rashes Neurological: Per HPI Musculoskeletal: Denies joint pain, back or neck discomfort. No decrease in ROM Behavioral/Psych: Denies anxiety, disturbance in thought content, and mood instability  Physical Exam: Vitals:   05/12/18 1300  BP: 132/70  Pulse: 84  Resp: 18  Temp: (!) 97.5 F (36.4 C)  SpO2: 98%    KPS: 70. General: Alert, cooperative, pleasant, in no acute distress Head: Craniotomy scar noted, dry and intact. EENT: No conjunctival injection or scleral icterus. Oral mucosa moist Lungs: Resp effort normal  Cardiac: Regular rate and rhythm Abdomen: Soft, non-distended abdomen Skin: No rashes cyanosis or petechiae. Extremities: no edema  Neurologic Exam: Mental Status: Awake, alert, attentive to examiner. Oriented to self and environment. Language is fluent with intact comprehension.  Cranial Nerves: Visual acuity is grossly normal. Visual fields are full. Extra-ocular movements intact. No ptosis. Face is symmetric, tongue midline. Motor: Tone and bulk are normal. Power is 4+/5 in left arm and leg, with impaired fine coordination in left hand. Reflexes are symmetric, no pathologic reflexes present. Intact finger to nose bilaterally Sensory: Intact to light touch and temperature Gait: Deferred  Labs: I have reviewed the data as listed    Component Value Date/Time   NA 140 05/02/2018 1354   K 3.9 05/02/2018 1354   CL 106 05/02/2018 1354   CO2 24 05/02/2018 1354   GLUCOSE 114 (H) 05/02/2018 1354   BUN 7 (L) 05/02/2018 1354   CREATININE 0.84 05/02/2018 1354   CALCIUM 8.8 (L) 05/02/2018 1354   PROT 6.1 (L) 05/02/2018 1354   ALBUMIN 3.5 05/02/2018 1354   AST 14 (L) 05/02/2018 1354   ALT 13 05/02/2018 1354   ALKPHOS 51 05/02/2018 1354   BILITOT 0.4 05/02/2018 1354   GFRNONAA >60 05/02/2018 1354   GFRAA >60 05/02/2018 1354   Lab Results  Component Value Date   WBC 4.1 05/12/2018   NEUTROABS 2.6 05/12/2018   HGB 10.6 (L)  05/12/2018   HCT 32.1 (L) 05/12/2018   MCV 97.3 05/12/2018   PLT 165 05/12/2018     Assessment/Plan 1.  Glioblastoma, IDH-wildtype [facility])   Mr. Schumm is clinically stable today, labs are within normal limits.  We recommended completing 6 week course of intensity-modulated radiation therapy, along with concurrent daily Temozolomide dosed at 64m/m2.    Chemotherapy should be held for the following:  ANC less than 1,000  Platelets less than 100,000  LFT or creatinine greater than 2x ULN  If clinical concerns/contraindications develop  He will return to clinic in ~3 weeks after radiation therapy with an MRI brain for review.  He should continue Keppra 10093mBID for seizure prevention.  Additionally, we will arrange consult with [place].  We appreciate the opportunity to participate in the care of EdBerton [name]  The total time spent in the encounter was 25 minutes and more than 50% was on counseling and review of test results   ZaVentura SellersMD Medical Director of Neuro-Oncology [facility] [place]2/20/20 1:56 PM

## 2018-05-13 ENCOUNTER — Telehealth: Payer: Self-pay | Admitting: Internal Medicine

## 2018-05-13 ENCOUNTER — Ambulatory Visit: Payer: Medicare Other

## 2018-05-13 ENCOUNTER — Ambulatory Visit
Admission: RE | Admit: 2018-05-13 | Discharge: 2018-05-13 | Disposition: A | Discharge status: Home / Self Care | Payer: Medicare Other | Source: Ambulatory Visit | Attending: Radiation Oncology | Admitting: Radiation Oncology

## 2018-05-13 DIAGNOSIS — Z510 Encounter for antineoplastic radiation therapy: Secondary | ICD-10-CM | POA: Diagnosis not present

## 2018-05-13 DIAGNOSIS — C711 Malignant neoplasm of frontal lobe: Secondary | ICD-10-CM | POA: Diagnosis not present

## 2018-05-13 NOTE — Telephone Encounter (Signed)
No 2/20 los

## 2018-05-16 ENCOUNTER — Ambulatory Visit: Payer: Medicare Other

## 2018-05-17 ENCOUNTER — Ambulatory Visit: Payer: Medicare Other

## 2018-05-17 DIAGNOSIS — I10 Essential (primary) hypertension: Secondary | ICD-10-CM | POA: Diagnosis not present

## 2018-05-17 DIAGNOSIS — D630 Anemia in neoplastic disease: Secondary | ICD-10-CM | POA: Diagnosis not present

## 2018-05-17 DIAGNOSIS — M1990 Unspecified osteoarthritis, unspecified site: Secondary | ICD-10-CM | POA: Diagnosis not present

## 2018-05-17 DIAGNOSIS — C719 Malignant neoplasm of brain, unspecified: Secondary | ICD-10-CM | POA: Diagnosis not present

## 2018-05-17 DIAGNOSIS — G8194 Hemiplegia, unspecified affecting left nondominant side: Secondary | ICD-10-CM | POA: Diagnosis not present

## 2018-05-17 DIAGNOSIS — Z483 Aftercare following surgery for neoplasm: Secondary | ICD-10-CM | POA: Diagnosis not present

## 2018-05-18 ENCOUNTER — Ambulatory Visit: Payer: Medicare Other

## 2018-05-20 DIAGNOSIS — G8194 Hemiplegia, unspecified affecting left nondominant side: Secondary | ICD-10-CM | POA: Diagnosis not present

## 2018-05-20 DIAGNOSIS — Z483 Aftercare following surgery for neoplasm: Secondary | ICD-10-CM | POA: Diagnosis not present

## 2018-05-20 DIAGNOSIS — I10 Essential (primary) hypertension: Secondary | ICD-10-CM | POA: Diagnosis not present

## 2018-05-20 DIAGNOSIS — C719 Malignant neoplasm of brain, unspecified: Secondary | ICD-10-CM | POA: Diagnosis not present

## 2018-05-20 DIAGNOSIS — M1990 Unspecified osteoarthritis, unspecified site: Secondary | ICD-10-CM | POA: Diagnosis not present

## 2018-05-20 DIAGNOSIS — D630 Anemia in neoplastic disease: Secondary | ICD-10-CM | POA: Diagnosis not present

## 2018-05-20 MED FILL — LEVETIRACETAM 100 MG/ML SOL: 100 | 15 days supply | Qty: 473 | Fill #3

## 2018-05-23 ENCOUNTER — Telehealth: Payer: Self-pay | Admitting: *Deleted

## 2018-05-23 NOTE — Telephone Encounter (Signed)
Wife stated that patient recently has had change in speech.  She says he is using words incorrectly and sometimes he doesn't make any sense when he communicates and then other times he is able to express himself very well.  She states the episodes were sporatic but have gotten worse.  She also states that during a time he was trying to tell her something he had a tremor of the left arm that resembled the previous seizures he had while in the hospital.  She also states he is unable to move his left thumb.  Per Dr Mickeal Skinner patient needs to be evaluated clinically and appointment was scheduled for tomorrow.

## 2018-05-24 ENCOUNTER — Encounter: Payer: Self-pay | Admitting: Internal Medicine

## 2018-05-24 ENCOUNTER — Ambulatory Visit (HOSPITAL_COMMUNITY)
Admission: RE | Admit: 2018-05-24 | Discharge: 2018-05-24 | Disposition: A | Discharge status: Home / Self Care | Payer: Medicare Other | Source: Ambulatory Visit | Attending: Internal Medicine | Admitting: Internal Medicine

## 2018-05-24 ENCOUNTER — Telehealth: Payer: Self-pay | Admitting: Internal Medicine

## 2018-05-24 ENCOUNTER — Encounter (HOSPITAL_COMMUNITY): Payer: Self-pay

## 2018-05-24 ENCOUNTER — Telehealth: Payer: Self-pay | Admitting: *Deleted

## 2018-05-24 ENCOUNTER — Inpatient Hospital Stay: Payer: Medicare Other | Attending: Radiation Oncology | Admitting: Internal Medicine

## 2018-05-24 VITALS — BP 129/70 | HR 100 | Temp 99.1°F | Resp 18 | Ht 64.5 in | Wt 162.9 lb

## 2018-05-24 DIAGNOSIS — E119 Type 2 diabetes mellitus without complications: Secondary | ICD-10-CM | POA: Insufficient documentation

## 2018-05-24 DIAGNOSIS — R112 Nausea with vomiting, unspecified: Secondary | ICD-10-CM | POA: Insufficient documentation

## 2018-05-24 DIAGNOSIS — C719 Malignant neoplasm of brain, unspecified: Secondary | ICD-10-CM | POA: Diagnosis not present

## 2018-05-24 DIAGNOSIS — Z87891 Personal history of nicotine dependence: Secondary | ICD-10-CM | POA: Insufficient documentation

## 2018-05-24 DIAGNOSIS — E7800 Pure hypercholesterolemia, unspecified: Secondary | ICD-10-CM | POA: Diagnosis not present

## 2018-05-24 DIAGNOSIS — Z79899 Other long term (current) drug therapy: Secondary | ICD-10-CM | POA: Insufficient documentation

## 2018-05-24 DIAGNOSIS — C711 Malignant neoplasm of frontal lobe: Secondary | ICD-10-CM | POA: Diagnosis not present

## 2018-05-24 DIAGNOSIS — Z923 Personal history of irradiation: Secondary | ICD-10-CM | POA: Diagnosis not present

## 2018-05-24 DIAGNOSIS — Z9221 Personal history of antineoplastic chemotherapy: Secondary | ICD-10-CM

## 2018-05-24 DIAGNOSIS — K219 Gastro-esophageal reflux disease without esophagitis: Secondary | ICD-10-CM | POA: Insufficient documentation

## 2018-05-24 DIAGNOSIS — Z7984 Long term (current) use of oral hypoglycemic drugs: Secondary | ICD-10-CM | POA: Insufficient documentation

## 2018-05-24 DIAGNOSIS — I10 Essential (primary) hypertension: Secondary | ICD-10-CM | POA: Diagnosis not present

## 2018-05-24 DIAGNOSIS — E079 Disorder of thyroid, unspecified: Secondary | ICD-10-CM | POA: Insufficient documentation

## 2018-05-24 MED ORDER — DEXAMETHASONE 4 MG PO TABS: 4.0000 mg | ORAL_TABLET | Freq: Two times a day (BID) | ORAL | 0 refills | Status: DC

## 2018-05-24 MED ORDER — LACOSAMIDE 100 MG PO TABS: 100.0000 mg | ORAL_TABLET | Freq: Two times a day (BID) | ORAL | 3 refills | Status: DC

## 2018-05-24 MED ORDER — IOHEXOL 300 MG/ML  SOLN
75.0000 mL | Freq: Once | INTRAMUSCULAR | Status: AC | PRN
  Administered 2018-05-24: 75 mL via INTRAVENOUS

## 2018-05-24 MED ORDER — SODIUM CHLORIDE (PF) 0.9 % IJ SOLN
INTRAMUSCULAR | Status: AC
  Filled 2018-05-24: qty 50

## 2018-05-24 MED ORDER — ZONISAMIDE 100 MG PO CAPS: 100.0000 mg | ORAL_CAPSULE | Freq: Every day | ORAL | 3 refills | Status: DC

## 2018-05-24 MED FILL — ZONISAMIDE 100 MG CAPSULE: 100 | 30 days supply | Qty: 30 | Fill #0

## 2018-05-24 MED FILL — DEXAMETHASONE 4 MG TABLET: 4 | 15 days supply | Qty: 30 | Fill #0

## 2018-05-24 NOTE — Telephone Encounter (Signed)
No los °

## 2018-05-24 NOTE — Telephone Encounter (Signed)
Patients wife called and stated that she couldn't afford the Vimpat as out of pocket was going to be around $680.  Dr. Mickeal Skinner ordered Zonegran instead.  Patients wife is aware.

## 2018-05-24 NOTE — Progress Notes (Signed)
[facility] at [place], [place] [address] [phone]   Interval Evaluation  Date of Service: 05/24/18 Patient Name: Bryan [name] Patient MRN: [id number] Patient DOB: 22-May-1940 Provider: Ventura Sellers, MD  Identifying Statement:  Bryan [name] is a 78 y.o. male with right frontal glioblastoma   Oncologic History:    Glioblastoma, IDH-wildtype ([place])    03/07/2018 Surgery    Craniotomy, resection by Dr. Tommi Rumps ([facility]).  Path is Glioblastoma    03/29/2018 -  Chemotherapy    The patient had [No matching medication found in this treatment plan]  for chemotherapy treatment.      Biomarkers:  MGMT Unknown.  IDH 1/2 Wild type.  EGFR Expressed  TERT Unknown   Interval History: Bryan [name] presents today for follow up after recent clinical changes.  He and his wife describe sudden loss of left arm function and decline in expressive language.  This was noticed when he woke up on Saturday morning, he had gone to bed "normal" the night before.  Although he has improved modestly since that time, he still is left with a noticeable increase in weakness in left arm and drooping in the left side of face.  His gait and left leg strength have remained at baseline.  Wife did note one episode of involuntary shaking/twitching of the left arm, lasting for ~1 minute, over the weekend.  He denies headaches, visual changes.  History is limited by language impairments today.    H+P (02/16/18) Bryan [name] 78 y.o. male presented after family noted drooping of the left side of his face yesterday.  In addition, he/they describe some cognitive changes, such as difficulty "finding words" in certain situations.  The patient also feels like his left leg is "lagging" a little behind his right lately.  He is walking on his own, however, and with no falls.  Denies headaches, seizures.   Medications: Current Outpatient Medications on File Prior to Visit    Medication Sig Dispense Refill  . acetaminophen (TYLENOL) 325 MG tablet Take 2 tablets (650 mg total) by mouth every 6 (six) hours as needed for mild pain.    [name] atorvastatin (LIPITOR) 40 MG tablet Take 1 tablet (40 mg total) by mouth daily. 30 tablet 0  . Flaxseed, Linseed, (FLAXSEED OIL PO) Take by mouth.    . fluticasone (FLONASE) 50 MCG/ACT nasal spray Place 1 spray into both nostrils 2 (two) times daily.    . Ginkgo Biloba Extract (GINKGO BILOBA MEMORY ENHANCER) 60 MG CAPS Take by mouth.    . levETIRAcetam (KEPPRA) 100 MG/ML solution Take 15 mLs (1,500 mg total) by mouth 2 (two) times daily. 473 mL 12  . levothyroxine (SYNTHROID, LEVOTHROID) 75 MCG tablet Take 1 tablet (75 mcg total) by mouth daily before breakfast. 30 tablet 0  . lisinopril (PRINIVIL,ZESTRIL) 5 MG tablet Take 3 tablets (15 mg total) by mouth daily. 30 tablet 1  . metFORMIN (GLUCOPHAGE) 1000 MG tablet Take 1 tablet (1,000 mg total) by mouth 2 (two) times daily with a meal. 60 tablet 1  . Multiple Vitamins-Minerals (MULTI-DAY PLUS MINERALS) TABS Take 1 tablet by mouth daily.    . naphazoline-pheniramine (NAPHCON-A) 0.025-0.3 % ophthalmic solution Place 1 drop into both eyes as needed for eye irritation or allergies.    . Omega-3 1000 MG CAPS Take by mouth.    . ondansetron (ZOFRAN) 4 MG tablet Take 1 tablet (4 mg total) by mouth every 6 (six) hours as needed  for nausea or vomiting. 30 tablet 1  . pantoprazole (PROTONIX) 40 MG tablet Take 1 tablet (40 mg total) by mouth daily. 30 tablet 1   No current facility-administered medications on file prior to visit.     Allergies:  Allergies  Allergen Reactions  . Other     Cats, pollen   Past Medical History:  Past Medical History:  Diagnosis Date  . Arthritis   . Brain cancer ([place])    Glioblastoma  . Diabetes mellitus without complication ([place])   . GERD (gastroesophageal reflux disease)   . Hypercholesterolemia   . Hypertension   . Thyroid disease    Past Surgical  History:  Past Surgical History:  Procedure Laterality Date  . CRANIOTOMY    . HERNIA REPAIR    . 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 Marital status: Married    Spouse name: Not on file  . Number of children: Not on file  . Years of education: Not on file  . Highest education level: Not on file  Occupational History  . Not on file  Social Needs  . Financial resource strain: Not on file  . Food insecurity:    Worry: Not on file    Inability: Not on file  . Transportation needs:    Medical: Not on file    Non-medical: Not on file  Tobacco Use  . Smoking status: Former Smoker    Packs/day: 0.25    Years: 3.00    Pack years: 0.75    Types: Cigarettes    Last attempt to quit: 03/23/1961    Years since quitting: 57.2  . Smokeless tobacco: Never Used  Substance and Sexual Activity  . Alcohol use: Yes    Alcohol/week: 1.0 standard drinks    Types: 1 Glasses of wine per week    Comment: weekly  . Drug use: Never  . Sexual activity: Not Currently  Lifestyle  . Physical activity:    Days per week: Not on file    Minutes per session: Not on file  . Stress: Not on file  Relationships  . Social connections:    Talks on phone: Not on file    Gets together: Not on file    Attends religious service: Not on file    Active member of club or organization: Not on file    Attends meetings of clubs or organizations: Not on file    Relationship status: Not on file  . Intimate partner violence:    Fear of current or ex partner: Not on file    Emotionally abused: Not on file    Physically abused: Not on file    Forced sexual activity: Not on file  Other Topics Concern  . Not on file  Social History Narrative   Married. Resides in [place].   Family History:  Family History  Problem Relation Age of Onset  . Diabetes Mellitus II Mother   . ALS Father     Review of  Systems: Constitutional: Denies fevers, chills or abnormal weight loss Eyes: Denies blurriness of vision Ears, nose, mouth, throat, and face: Denies mucositis or sore throat Respiratory: Denies cough, dyspnea or wheezes Cardiovascular: Denies palpitation, chest discomfort or lower extremity swelling Gastrointestinal:  Denies nausea, constipation, diarrhea GU: Denies dysuria or incontinence Skin: Denies abnormal skin rashes Neurological: Per HPI Musculoskeletal: Denies joint pain, back or neck discomfort. No  decrease in ROM Behavioral/Psych: Denies anxiety, disturbance in thought content, and mood instability  Physical Exam: Vitals:   05/24/18 0918  BP: 129/70  Pulse: 100  Resp: 18  Temp: 99.1 F (37.3 C)  SpO2: 96%    KPS: 70. General: Alert, cooperative, pleasant, in no acute distress Head: Craniotomy scar noted, dry and intact. EENT: No conjunctival injection or scleral icterus. Oral mucosa moist Lungs: Resp effort normal Cardiac: Regular rate and rhythm Abdomen: Soft, non-distended abdomen Skin: No rashes cyanosis or petechiae. Extremities: no edema  Neurologic Exam: Mental Status: Awake, alert, attentive to examiner. Oriented to self and environment. Language is fairly densely impaired with regards to fluency, comprehension and repetition.   Cranial Nerves: Visual acuity is grossly normal. Visual fields are full. Extra-ocular movements intact. No ptosis. L UMN facial paresis, tongue midline. Motor: Tone and bulk are normal. Power is 3/5 in left arm and 4+/5 in leg, with loss of most fine motor function in left hand. Reflexes are symmetric, no pathologic reflexes present. Intact finger to nose bilaterally Sensory: Intact to light touch and temperature Gait: Deferred  Labs: I have reviewed the data as listed    Component Value Date/Time   NA 140 05/12/2018 1325   K 3.8 05/12/2018 1325   CL 106 05/12/2018 1325   CO2 24 05/12/2018 1325   GLUCOSE 150 (H) 05/12/2018  1325   BUN 8 05/12/2018 1325   CREATININE 0.91 05/12/2018 1325   CALCIUM 9.2 05/12/2018 1325   PROT 6.2 (L) 05/12/2018 1325   ALBUMIN 3.7 05/12/2018 1325   AST 13 (L) 05/12/2018 1325   ALT 12 05/12/2018 1325   ALKPHOS 51 05/12/2018 1325   BILITOT 0.6 05/12/2018 1325   GFRNONAA >60 05/12/2018 1325   GFRAA >60 05/12/2018 1325   Lab Results  Component Value Date   WBC 4.1 05/12/2018   NEUTROABS 2.6 05/12/2018   HGB 10.6 (L) 05/12/2018   HCT 32.1 (L) 05/12/2018   MCV 97.3 05/12/2018   PLT 165 05/12/2018     Assessment/Plan 1.  Glioblastoma, IDH-wildtype [facility])   Mr. Voiles has clinical changes today which are most suggestive of breakthrough seizures.  One focal motor seizure was witnessed by his wife, although we suspect there was a cluster of events either not witnessed or subclinical, perhaps even ongoing.  Etiology is most likely radiation associated inflammation, given the timing of recently completed IMRT and lack of systemic symptoms or breakthrough seizure risk factors.  Given density of language impairment, we recommended obtaining a stat CT head to rule out tumor site hemorrhage, infarct separate from tumor site, or significant mass effect and midline shift.    In the meantime, we recommended starting dexamethasone at 26m BID x4 days, then 456mdaily thereafter.  We also recommended starting a second AED, either Vimpat 10059mID or Zonisamide 100m29mily depending on approval/coverage.  Keppra should continue at 1500mg30m as prior.    We asked him to return to clinic in 1 week to evaluate these interventions and taper/adjust medication as needed.  Will call with stat review of CT results as they come in later today.  We appreciate the opportunity to participate in the care of [facility] total time spent in the encounter was 40 minutes and more than 50% was on counseling and review of test results   ZachaVentura SellersMedical Director of  Neuro-Oncology [facility]sworth3/20 9:09 AM

## 2018-05-25 NOTE — Telephone Encounter (Signed)
Copy delivered to provider collaborative.

## 2018-05-26 DIAGNOSIS — Z483 Aftercare following surgery for neoplasm: Secondary | ICD-10-CM | POA: Diagnosis not present

## 2018-05-26 DIAGNOSIS — C719 Malignant neoplasm of brain, unspecified: Secondary | ICD-10-CM | POA: Diagnosis not present

## 2018-05-26 DIAGNOSIS — I10 Essential (primary) hypertension: Secondary | ICD-10-CM | POA: Diagnosis not present

## 2018-05-26 DIAGNOSIS — M1990 Unspecified osteoarthritis, unspecified site: Secondary | ICD-10-CM | POA: Diagnosis not present

## 2018-05-26 DIAGNOSIS — D630 Anemia in neoplastic disease: Secondary | ICD-10-CM | POA: Diagnosis not present

## 2018-05-26 DIAGNOSIS — G8194 Hemiplegia, unspecified affecting left nondominant side: Secondary | ICD-10-CM | POA: Diagnosis not present

## 2018-05-27 ENCOUNTER — Encounter: Payer: Self-pay | Admitting: Internal Medicine

## 2018-05-27 ENCOUNTER — Ambulatory Visit (HOSPITAL_COMMUNITY): Payer: Medicare Other

## 2018-05-30 ENCOUNTER — Telehealth: Payer: Self-pay | Admitting: *Deleted

## 2018-05-30 DIAGNOSIS — I10 Essential (primary) hypertension: Secondary | ICD-10-CM | POA: Diagnosis not present

## 2018-05-30 DIAGNOSIS — Z483 Aftercare following surgery for neoplasm: Secondary | ICD-10-CM | POA: Diagnosis not present

## 2018-05-30 DIAGNOSIS — C719 Malignant neoplasm of brain, unspecified: Secondary | ICD-10-CM | POA: Diagnosis not present

## 2018-05-30 DIAGNOSIS — G8194 Hemiplegia, unspecified affecting left nondominant side: Secondary | ICD-10-CM | POA: Diagnosis not present

## 2018-05-30 DIAGNOSIS — D630 Anemia in neoplastic disease: Secondary | ICD-10-CM | POA: Diagnosis not present

## 2018-05-30 DIAGNOSIS — M1990 Unspecified osteoarthritis, unspecified site: Secondary | ICD-10-CM | POA: Diagnosis not present

## 2018-05-30 NOTE — Telephone Encounter (Signed)
Dr. Mickeal Skinner has attempted several times to reach patient.  I finally got patients wife on the phone and transferred call directly to him so that they could communicate via phone.

## 2018-06-01 ENCOUNTER — Other Ambulatory Visit: Payer: Self-pay | Admitting: Radiation Therapy

## 2018-06-03 ENCOUNTER — Encounter: Payer: Self-pay | Admitting: Radiation Oncology

## 2018-06-03 ENCOUNTER — Ambulatory Visit (HOSPITAL_COMMUNITY)
Admission: RE | Admit: 2018-06-03 | Discharge: 2018-06-03 | Disposition: A | Discharge status: Home / Self Care | Payer: Medicare Other | Source: Ambulatory Visit | Attending: Internal Medicine | Admitting: Internal Medicine

## 2018-06-03 ENCOUNTER — Other Ambulatory Visit: Payer: Self-pay

## 2018-06-03 DIAGNOSIS — R479 Unspecified speech disturbances: Secondary | ICD-10-CM | POA: Diagnosis not present

## 2018-06-03 DIAGNOSIS — C711 Malignant neoplasm of frontal lobe: Secondary | ICD-10-CM | POA: Insufficient documentation

## 2018-06-03 DIAGNOSIS — R531 Weakness: Secondary | ICD-10-CM | POA: Diagnosis not present

## 2018-06-03 MED ORDER — GADOBUTROL 1 MMOL/ML IV SOLN
7.0000 mL | Freq: Once | INTRAVENOUS | Status: AC | PRN
  Administered 2018-06-03: 7 mL via INTRAVENOUS

## 2018-06-03 MED FILL — LEVETIRACETAM 100 MG/ML SOL: 100 | 15 days supply | Qty: 473 | Fill #4

## 2018-06-03 NOTE — Progress Notes (Signed)
  Radiation Oncology         [phone] ________________________________  Name: Bryan [name] MRN: [id number]  Date: 06/03/2018  DOB: 05-17-1940  End of Treatment Note  Diagnosis:   78 y.o. male with newly diagnosed WHO grade IV glioblastoma s/p gross total resection 03/07/18.     Indication for treatment:  Local Control       Radiation treatment dates:   04/04/2018 - 05/13/2018  Site/dose:    1. The initial enhancing tumor, peritumoral edema, plus 1 cm were treated to 44 Gy in 22 fractions. 2. The enhancing tumor plus 1 cm was boosted to 60 Gy with 8 additional fractions of 2 Gy.  Beams/energy:  1. The initial enhancing tumor, peritumoral edema, plus 1 cm were treated using helical intensity modulated radiotherapy delivering 6 megavolt photons. Image guidance was performed with megavoltage CT studies prior to each fraction. He was immobilized with a thermoplastic mask. 2. The enhancing tumor plus 1 cm was boosted using helical intensity modulated radiotherapy delivering 6 megavolt photons. Image guidance was performed with megavoltage CT studies prior to each fraction. He was immobilized with a a thermoplastic mask.  Narrative: The patient tolerated radiation treatment relatively well. His initial baseline was unsteady gait, left hand fine motor issues, left shoulder pain, and slurred speech. He reported improvement in gait, fine motor issues, speech, and pain as treatments went on. He denied nausea or vomiting and visual or auditory changes throughout treatment. He did experience fatigue and intermittent headaches throughout treatment as well as some taste change and decreased appetite.  Plan: The patient has completed radiation treatment. The patient will return to radiation oncology clinic for routine followup in one month. I advised him to call or return sooner if he has any questions or concerns related to his recovery or treatment. ________________________________  Sheral Apley.  Tammi Klippel, M.D.  This document serves as a record of services personally performed by Tyler Pita, MD. It was created on his behalf by Wilburn Mylar, a trained medical scribe. The creation of this record is based on the scribe's personal observations and the provider's statements to them. This document has been checked and approved by the attending provider.

## 2018-06-06 ENCOUNTER — Inpatient Hospital Stay: Payer: Medicare Other

## 2018-06-06 ENCOUNTER — Other Ambulatory Visit: Payer: Self-pay | Admitting: *Deleted

## 2018-06-06 DIAGNOSIS — E038 Other specified hypothyroidism: Secondary | ICD-10-CM | POA: Diagnosis not present

## 2018-06-06 DIAGNOSIS — Z125 Encounter for screening for malignant neoplasm of prostate: Secondary | ICD-10-CM | POA: Diagnosis not present

## 2018-06-06 DIAGNOSIS — E119 Type 2 diabetes mellitus without complications: Secondary | ICD-10-CM | POA: Diagnosis not present

## 2018-06-06 NOTE — Telephone Encounter (Signed)
Bryan [name] left a message wanting to know if they should come in for appt with Dr Mickeal Skinner on Thursday.  Requesting a refill of protonix to [facility]

## 2018-06-07 ENCOUNTER — Other Ambulatory Visit: Payer: Self-pay | Admitting: *Deleted

## 2018-06-07 ENCOUNTER — Ambulatory Visit: Payer: Medicare Other | Admitting: Internal Medicine

## 2018-06-07 ENCOUNTER — Other Ambulatory Visit: Payer: Self-pay | Admitting: Internal Medicine

## 2018-06-07 MED ORDER — PANTOPRAZOLE SODIUM 40 MG PO TBEC: 40.0000 mg | DELAYED_RELEASE_TABLET | Freq: Every day | ORAL | 3 refills | Status: DC

## 2018-06-07 MED FILL — PANTOPRAZOLE SOD DR 40 MG T: 40 | 30 days supply | Qty: 30 | Fill #0

## 2018-06-09 ENCOUNTER — Telehealth: Payer: Self-pay | Admitting: Pharmacist

## 2018-06-09 ENCOUNTER — Telehealth: Payer: Self-pay | Admitting: Internal Medicine

## 2018-06-09 ENCOUNTER — Other Ambulatory Visit: Payer: Self-pay

## 2018-06-09 ENCOUNTER — Inpatient Hospital Stay (HOSPITAL_BASED_OUTPATIENT_CLINIC_OR_DEPARTMENT_OTHER): Payer: Medicare Other | Admitting: Internal Medicine

## 2018-06-09 ENCOUNTER — Encounter: Payer: Self-pay | Admitting: Internal Medicine

## 2018-06-09 VITALS — BP 125/66 | HR 98 | Temp 98.4°F | Resp 18 | Ht 64.5 in | Wt 155.2 lb

## 2018-06-09 DIAGNOSIS — R112 Nausea with vomiting, unspecified: Secondary | ICD-10-CM | POA: Diagnosis not present

## 2018-06-09 DIAGNOSIS — C711 Malignant neoplasm of frontal lobe: Secondary | ICD-10-CM

## 2018-06-09 DIAGNOSIS — E119 Type 2 diabetes mellitus without complications: Secondary | ICD-10-CM | POA: Diagnosis not present

## 2018-06-09 DIAGNOSIS — K219 Gastro-esophageal reflux disease without esophagitis: Secondary | ICD-10-CM | POA: Diagnosis not present

## 2018-06-09 DIAGNOSIS — Z923 Personal history of irradiation: Secondary | ICD-10-CM | POA: Diagnosis not present

## 2018-06-09 DIAGNOSIS — Z9221 Personal history of antineoplastic chemotherapy: Secondary | ICD-10-CM | POA: Diagnosis not present

## 2018-06-09 MED ORDER — TEMOZOLOMIDE 100 MG PO CAPS: 100.0000 mg | ORAL_CAPSULE | Freq: Every day | ORAL | 0 refills | Status: DC

## 2018-06-09 MED ORDER — ONDANSETRON HCL 8 MG PO TABS: 8.0000 mg | ORAL_TABLET | Freq: Two times a day (BID) | ORAL | 1 refills | Status: DC | PRN

## 2018-06-09 MED ORDER — TEMOZOLOMIDE 140 MG PO CAPS: 140.0000 mg | ORAL_CAPSULE | Freq: Every day | ORAL | 0 refills | Status: DC

## 2018-06-09 MED ORDER — TEMOZOLOMIDE 20 MG PO CAPS: 20.0000 mg | ORAL_CAPSULE | Freq: Every day | ORAL | 0 refills | Status: DC

## 2018-06-09 NOTE — Telephone Encounter (Signed)
Oral Oncology Pharmacist Encounter  Received new prescription for Temodar (temozolomide) for the maintenance treatment of glioblastoma multiforme, planned duration 6-12 months of treatment.  Patient received concurrent Temodar (75mg /m2 daily) and radiation x 6 weeks 04/04/18-05/13/18, patient appears to have tolerated this phase of therapy fairly well, perhaps except for some nausea managed with ondansetron  Temodar is planned to be initiated at ~150mg /m2 daily for 5 days on, 23 days off, and repeated. If 1st cycle is well tolerated Temodar dose may be increased to 200 mg/m2 daily for 5 days on, 23 days off, repeated every 28 days for subsequent cycles  Labs from 05/12/18 assessed, OK for treatment.  Current medication list in Epic reviewed, no DDIs with Temodar identified.  Prescription has been e-scribed to the [facility] for benefits analysis and approval.  Oral Oncology Clinic will continue to follow for insurance authorization, copayment issues, initial counseling and start date.  Johny Drilling, PharmD, BCPS, BCOP  06/09/2018 2:24 PM Oral Oncology Clinic [phone]

## 2018-06-09 NOTE — Progress Notes (Signed)
[facility] at [place], [place] [address] [phone]   Interval Evaluation  Date of Service: 06/09/18 Patient Name: Bryan [name] Patient MRN: [id number] Patient DOB: 07-10-1940 Provider: Ventura Sellers, MD  Identifying Statement:  Bryan [name] is a 78 y.o. male with right frontal glioblastoma   Oncologic History:    Glioblastoma, IDH-wildtype ([place])    03/07/2018 Surgery    Craniotomy, resection by Dr. Tommi Rumps ([facility]).  Path is Glioblastoma    03/29/2018 -  Chemotherapy    The patient had [No matching medication found in this treatment plan]  for chemotherapy treatment.     04/04/2018 - 05/13/2018 Radiation Therapy    IMRT and concurrent Temozolomide with Dr. Tammi Klippel     Biomarkers:  MGMT Unknown.  IDH 1/2 Wild type.  EGFR Expressed  TERT Unknown   Interval History: Bryan [name] presents today for follow up after recent MRI.  He and his wife describe notable improvement in motor function since the one week dosing of decadron was given.  There have been no further seizures.  Speech has been slower to recover, he still is struggling to communicate in sentences.  Right now he is using a cane to get around the house, has walker with him for the longer trip today. He denies headaches, visual changes.  History is still limited by language impairments today.    H+P (02/16/18) Bryan [name] 78 y.o. male presented after family noted drooping of the left side of his face yesterday.  In addition, he/they describe some cognitive changes, such as difficulty "finding words" in certain situations.  The patient also feels like his left leg is "lagging" a little behind his right lately.  He is walking on his own, however, and with no falls.  Denies headaches, seizures.   Medications: Current Outpatient Medications on File Prior to Visit  Medication Sig Dispense Refill   acetaminophen (TYLENOL) 325 MG tablet Take 2 tablets (650 mg  total) by mouth every 6 (six) hours as needed for mild pain.     atorvastatin (LIPITOR) 40 MG tablet Take 1 tablet (40 mg total) by mouth daily. 30 tablet 0   dexamethasone (DECADRON) 4 MG tablet Take 1 tablet (4 mg total) by mouth 2 (two) times daily. 30 tablet 0   Flaxseed, Linseed, (FLAXSEED OIL PO) Take by mouth.     fluticasone (FLONASE) 50 MCG/ACT nasal spray Place 1 spray into both nostrils 2 (two) times daily.     Ginkgo Biloba Extract (GINKGO BILOBA MEMORY ENHANCER) 60 MG CAPS Take by mouth.     levETIRAcetam (KEPPRA) 100 MG/ML solution Take 15 mLs (1,500 mg total) by mouth 2 (two) times daily. 473 mL 12   levothyroxine (SYNTHROID, LEVOTHROID) 75 MCG tablet Take 1 tablet (75 mcg total) by mouth daily before breakfast. 30 tablet 0   lisinopril (PRINIVIL,ZESTRIL) 5 MG tablet Take 3 tablets (15 mg total) by mouth daily. 30 tablet 1   metFORMIN (GLUCOPHAGE) 1000 MG tablet Take 1 tablet (1,000 mg total) by mouth 2 (two) times daily with a meal. 60 tablet 1   Multiple Vitamins-Minerals (MULTI-DAY PLUS MINERALS) TABS Take 1 tablet by mouth daily.     naphazoline-pheniramine (NAPHCON-A) 0.025-0.3 % ophthalmic solution Place 1 drop into both eyes as needed for eye irritation or allergies.     Omega-3 1000 MG CAPS Take by mouth.     ondansetron (ZOFRAN) 4 MG tablet Take 1 tablet (4 mg total) by mouth every 6 (  six) hours as needed for nausea or vomiting. 30 tablet 1   pantoprazole (PROTONIX) 40 MG tablet Take 1 tablet (40 mg total) by mouth daily. 30 tablet 3   zonisamide (ZONEGRAN) 100 MG capsule Take 1 capsule (100 mg total) by mouth daily. 30 capsule 3   No current facility-administered medications on file prior to visit.     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     Social History:  Social History   Socioeconomic History   Marital status: Married    Spouse name: Not on file   Number of children: Not on file   Years of education: Not on file   Highest education level: Not on file  Occupational History   Not on file  Social Needs   Financial resource strain: Not on file   Food insecurity:    Worry: Not on file    Inability: Not on file   Transportation needs:    Medical: Not on file    Non-medical: Not on file  Tobacco Use   Smoking status: Former Smoker    Packs/day: 0.25    Years: 3.00    Pack years: 0.75    Types: Cigarettes    Last attempt to quit: 03/23/1961    Years since quitting: 57.2   Smokeless tobacco: Never Used  Substance and Sexual Activity   Alcohol use: Yes    Alcohol/week: 1.0 standard drinks    Types: 1 Glasses of wine per week    Comment: weekly   Drug use: Never   Sexual activity: Not Currently  Lifestyle   Physical activity:    Days per week: Not on file    Minutes per session: Not on file   Stress: Not on file  Relationships   Social connections:    Talks on phone: Not on file    Gets together: Not on file    Attends religious service: Not on file    Active member of club or organization: Not on file    Attends meetings of clubs or organizations: Not on file    Relationship status: Not on file   Intimate partner violence:    Fear of current or ex partner: Not on file    Emotionally abused: Not on file    Physically abused: Not on file    Forced sexual activity: Not on file  Other Topics Concern   Not on file  Social History Narrative   Married. Resides in [place].   Family History:  Family  History  Problem Relation Age of Onset   Diabetes Mellitus II Mother    ALS Father     Review of Systems: Constitutional: Denies fevers, chills or abnormal weight loss Eyes: Denies blurriness of vision Ears, nose, mouth, throat, and face: Denies mucositis or sore throat Respiratory: Denies cough, dyspnea or wheezes Cardiovascular: Denies palpitation, chest discomfort or lower extremity swelling Gastrointestinal:  Denies nausea, constipation,  diarrhea GU: Denies dysuria or incontinence Skin: Denies abnormal skin rashes Neurological: Per HPI Musculoskeletal: Denies joint pain, back or neck discomfort. No decrease in ROM Behavioral/Psych: Denies anxiety, disturbance in thought content, and mood instability  Physical Exam: Vitals:   06/09/18 0946  BP: 125/66  Pulse: 98  Resp: 18  Temp: 98.4 F (36.9 C)  SpO2: 98%    KPS: 70. General: Alert, cooperative, pleasant, in no acute distress Head: Craniotomy scar noted, dry and intact. EENT: No conjunctival injection or scleral icterus. Oral mucosa moist Lungs: Resp effort normal Cardiac: Regular rate and rhythm Abdomen: Soft, non-distended abdomen Skin: No rashes cyanosis or petechiae. Extremities: no edema  Neurologic Exam: Mental Status: Awake, alert, attentive to examiner. Oriented to self and environment. Language is fairly densely impaired with regards to fluency, comprehension and repetition.   Cranial Nerves: Visual acuity is grossly normal. Visual fields are full. Extra-ocular movements intact. No ptosis. L UMN facial paresis, tongue midline. Motor: Tone and bulk are normal. Power is 4+/5 in left arm and 4+/5 in leg, with impaired fine motor function in left hand. Reflexes are symmetric, no pathologic reflexes present. Intact finger to nose bilaterally Sensory: Intact to light touch and temperature Gait: Deferred  Labs: I have reviewed the data as listed    Component Value Date/Time   NA 140 05/12/2018 1325   K 3.8  05/12/2018 1325   CL 106 05/12/2018 1325   CO2 24 05/12/2018 1325   GLUCOSE 150 (H) 05/12/2018 1325   BUN 8 05/12/2018 1325   CREATININE 0.91 05/12/2018 1325   CALCIUM 9.2 05/12/2018 1325   PROT 6.2 (L) 05/12/2018 1325   ALBUMIN 3.7 05/12/2018 1325   AST 13 (L) 05/12/2018 1325   ALT 12 05/12/2018 1325   ALKPHOS 51 05/12/2018 1325   BILITOT 0.6 05/12/2018 1325   GFRNONAA >60 05/12/2018 1325   GFRAA >60 05/12/2018 1325   Lab Results  Component Value Date   WBC 4.1 05/12/2018   NEUTROABS 2.6 05/12/2018   HGB 10.6 (L) 05/12/2018   HCT 32.1 (L) 05/12/2018   MCV 97.3 05/12/2018   PLT 165 05/12/2018    Imaging:  [facility] Clinician Interpretation: I have personally reviewed the CNS images as listed.  My interpretation, in the context of the patient's clinical presentation, is treatment effect vs true progression  Ct Head W Wo Contrast  Result Date: 05/24/2018 CLINICAL DATA:  78 year old male with glioblastoma and sudden clinical decline. Status post surgery in December, and radiation. Continues on chemotherapy. EXAM: CT HEAD WITHOUT AND WITH CONTRAST TECHNIQUE: Contiguous axial images were obtained from the base of the skull through the vertex without and with intravenous contrast CONTRAST:  85m OMNIPAQUE IOHEXOL 300 MG/ML  SOLN COMPARISON:  Postoperative head CT 03/13/2018. Preoperative MRI 02/16/2018. FINDINGS: Brain: A small heterogeneous 6 millimeter right side extra-axial collection underlying the craniotomy has mildly decreased since December (series 2, image 16). There is no midline shift. Basilar cisterns remain normal. Decreased postoperative blood products at the right operculum and insula resection cavity. Resolved mass effect on the right lateral ventricle. Following contrast there is irregular enhancement along the edges of the resection cavity and tracking posterior (series 4, images 14 and 16). The abnormal enhancement encompasses an area of 47 x 24 x 33 millimeters (AP by  transverse by CC). Regional white matter hypodensity also does appear increased, but regional mass effect seems decreased since the postoperative CT. Stable gray-white matter differentiation elsewhere. No ventriculomegaly. No acute intracranial hemorrhage. No cortically based  acute infarct identified. No other abnormal intracranial enhancement identified. Vascular: Calcified atherosclerosis at the skull base. Skull: Stable previous right frontotemporal craniotomy. Sinuses/Orbits: Stable chronic left maxillary sinusitis. The left tympanic cavity and mastoids remain clear, but there is new right mastoid fluid since December. The right tympanic cavity appears clear. Other: Satisfactory postoperative appearance of the right scalp soft tissues. Negative orbits aside from some leftward gaze. IMPRESSION: 1. Treated right insula/operculum glioblastoma with enhancement as well as increased regional hypodensity since the December postoperative CT. However, regional mass effect has decreased. This is indeterminate for radiation effect/pseudo-progression versus true progression. 2. No new intracranial abnormality. 3. Small mixed density post craniotomy right side subdural collection has decreased. Electronically Signed   By: Genevie Ann M.D.   On: 05/24/2018 13:32   Mr Jeri Cos AJ Contrast  Result Date: 06/03/2018 CLINICAL DATA:  Followup surgery for glioblastoma. Difficulty walking. Speech disturbance. Left-sided weakness. EXAM: MRI HEAD WITHOUT AND WITH CONTRAST TECHNIQUE: Multiplanar, multiecho pulse sequences of the brain and surrounding structures were obtained without and with intravenous contrast. CONTRAST:  7 cc Gadavist COMPARISON:  CT 05/24/2018.  MRI 03/07/2018.  MRI 02/16/2018. FINDINGS: Brain: Previous right-sided craniotomy for partial resection of a glioblastoma in the deep insular region on the right. Residual enhancing tissue and central post resect of space measures in total 2.8 cm cephalo caudal, 3.3 cm front  to back and 1.9 cm right to left. Expected blood products in the region of surgery. Mild adjacent brain edema. No ongoing mass effect. Postsurgical subdural collection on the right measuring 7 8 mm in thickness. Right-to-left midline shift of 3 mm. No hydrocephalus. Though difficult to precisely quantitate, the volume of enhancing tissue has increased when compared to the study of 03/07/2018, consistent with some regrowth of tumor tissue. Background pattern of chronic small vessel ischemic changes of the white matter as seen previously. Vascular: Major vessels at the base of the brain show flow. Skull and upper cervical spine: Otherwise negative Sinuses/Orbits: Opacified left maxillary sinus as seen previously. Orbits negative. Right mastoid effusion, worsened. Other: None IMPRESSION: Partial resection/debulking of a deep brain glioblastoma on the right. Residual enhancing tissue and central post resection space measures in total 2.8 x 3.3 x 1.9 cm. Mild surrounding brain edema. The volume of enhancing tissue has increased compared to the study of 03/07/2018, consistent with regrowth of tumor. Electronically Signed   By: Nelson Chimes M.D.   On: 06/03/2018 14:11    Assessment/Plan 1.  Glioblastoma, IDH-wildtype [facility])   Bryan [name] is clinically stable today.  His MRI demonstrates changes which are suggestive of post-radiation inflammatory changes, vs less likely organic tumor progression.  His positive response to steroids is encouraging in this context.    We extensively discussed potential treatment plans for further treatment of his glioblastoma.  These discussions included recommendations for oral chemotherapy and information regarding the Optune tumor-treating fields device.  We recommended initiating treatment with Temozolomide 150 mg/m2, on for five days and off for twenty three days in twenty eight day cycles. The patient will have a complete blood count performed on days 21 and 28 of each cycle,  and a comprehensive metabolic panel performed on day 28 of each cycle. Labs may need to be performed more often. Zofran will prescribed for home use for nausea/vomiting.   Chemotherapy should be held for the following:  ANC less than 1,000  Platelets less than 100,000  LFT or creatinine greater than 2x ULN  If clinical concerns/contraindications develop  He  should remain off decadron in the interim, if able.  He will call us after further consideration of Optune if he is interested in utilizing the device.  We asked him to return to clinic in 4 weeks with labs for evaluation.  We appreciate the opportunity to participate in the care of [place] and treatment plan were communicated to [facility] as well.  The total time spent in the encounter was 40 minutes and more than 50% was on counseling and review of test results   Ventura Sellers, MD Medical Director of Neuro-Oncology [facility] at [place] 06/09/18 9:47 AM

## 2018-06-09 NOTE — Progress Notes (Signed)
DISCONTINUE ON PATHWAY REGIMEN - Neuro     One cycle, daily for 42 days concurrent with RT:     Temozolomide   **Always confirm dose/schedule in your pharmacy ordering system**  REASON: Continuation Of Treatment PRIOR TREATMENT: [id number]: Radiation Therapy with Concurrent Temozolomide 75 mg/m2 Daily x 6 Weeks, Followed by Sequential Temozolomide TREATMENT RESPONSE: Stable Disease (SD)  START ON PATHWAY REGIMEN - Neuro     A cycle is every 28 days:     Temozolomide      Temozolomide   **Always confirm dose/schedule in your pharmacy ordering system**  Patient Characteristics: Glioblastoma, Newly Diagnosed / Treatment Naive, Good Performance Status and/or Younger Patient, MGMT Promoter Unmethylated/Unknown Disease Classification: Glioma Disease Classification: Glioblastoma Disease Status: Newly Diagnosed / Treatment Naive Performance Status: Good Performance Status and/or Younger Patient MGMT Promoter Methylation Status: Awaiting Test Results Intent of Therapy: Non-Curative / Palliative Intent, Discussed with Patient 

## 2018-06-09 NOTE — Telephone Encounter (Signed)
Gave avs  ° °

## 2018-06-10 NOTE — Telephone Encounter (Signed)
Oral Chemotherapy Pharmacist Encounter   I spoke with patient's wife, Juliann Pulse, for overview of: Temodar (temozolomide) for the maintenance treatment of glioblastoma multiforme, planned duration 6-12 months of treatment.  Counseled on administration, dosing, side effects, monitoring, drug-food interactions, safe handling, storage, and disposal.  Patient will take Temodar 20mg  capsules, Temodar 100mg  capsules, and Temodar 140mg  capsules, 260 mg total daily dose, by mouth once daily, may take at bedtime and on an empty stomach to decrease nausea and vomiting.  If 1st cycle is well tolerated, Juliann Pulse informed that Temodar dose may be increased to 200 mg/m2 daily for 5 days on, 23 days off, repeated every 28 days for subsequent cycles  Patient will take Temodar daily for 5 days on, 23 days off, and repeated.  Temodar start date: 06/14/2018   Patient will take Zofran 8mg  tablet, 1 tablet by mouth 30-60 min prior to Temodar dose to help decrease N/V.   Adverse effects include but are not limited to: nausea, vomiting, anorexia, GI upset, rash, drug fever, and fatigue. Rare but serious adverse effects of pneumocystis pneumonia and secondary malignancy also discussed.  PCP prophylaxis will not be initiated at this time, but may be added based on lymphocyte count in the future.  Reviewed importance of keeping a medication schedule and plan for any missed doses.  Mrs. Curl voiced understanding and appreciation.   All questions answered. Medication reconciliation performed and medication/allergy list updated.  Temodar prescriptions and ondansetron prescription will ship from the [facility] on Monday 3/23 for delivery to patient's home on Tuesday 3/24. Plan is to start Temodar administration on Tuesday 3/24 at bedtime.  Confirmed office visits on 07/11/18 at [facility] with Mrs. Bulthuis.  They know to call the office with questions or concerns. Oral Oncology Clinic will continue to  follow.  Johny Drilling, PharmD, BCPS, BCOP  06/10/2018  9:55 AM Oral Oncology Clinic [phone]

## 2018-06-13 DIAGNOSIS — R82998 Other abnormal findings in urine: Secondary | ICD-10-CM | POA: Diagnosis not present

## 2018-06-13 DIAGNOSIS — J984 Other disorders of lung: Secondary | ICD-10-CM | POA: Diagnosis not present

## 2018-06-13 DIAGNOSIS — G8190 Hemiplegia, unspecified affecting unspecified side: Secondary | ICD-10-CM | POA: Diagnosis not present

## 2018-06-13 DIAGNOSIS — I2510 Atherosclerotic heart disease of native coronary artery without angina pectoris: Secondary | ICD-10-CM | POA: Diagnosis not present

## 2018-06-13 DIAGNOSIS — C719 Malignant neoplasm of brain, unspecified: Secondary | ICD-10-CM | POA: Diagnosis not present

## 2018-06-13 DIAGNOSIS — Z1331 Encounter for screening for depression: Secondary | ICD-10-CM | POA: Diagnosis not present

## 2018-06-13 DIAGNOSIS — Z0000 Encounter for general adult medical examination without abnormal findings: Secondary | ICD-10-CM | POA: Diagnosis not present

## 2018-06-13 DIAGNOSIS — N281 Cyst of kidney, acquired: Secondary | ICD-10-CM | POA: Diagnosis not present

## 2018-06-13 DIAGNOSIS — I10 Essential (primary) hypertension: Secondary | ICD-10-CM | POA: Diagnosis not present

## 2018-06-13 DIAGNOSIS — N210 Calculus in bladder: Secondary | ICD-10-CM | POA: Diagnosis not present

## 2018-06-13 DIAGNOSIS — E119 Type 2 diabetes mellitus without complications: Secondary | ICD-10-CM | POA: Diagnosis not present

## 2018-06-13 DIAGNOSIS — E038 Other specified hypothyroidism: Secondary | ICD-10-CM | POA: Diagnosis not present

## 2018-06-13 DIAGNOSIS — N401 Enlarged prostate with lower urinary tract symptoms: Secondary | ICD-10-CM | POA: Diagnosis not present

## 2018-06-13 MED FILL — TEMOZOLOMIDE 100 MG CAPS: 100 | 28 days supply | Qty: 5 | Fill #0

## 2018-06-13 MED FILL — TEMOZOLOMIDE 140 MG CAPSULE: 140 | 28 days supply | Qty: 5 | Fill #0

## 2018-06-13 MED FILL — ONDANSETRON HCL 8 MG TABLET: 8 | 15 days supply | Qty: 30 | Fill #0

## 2018-06-13 MED FILL — TEMOZOLOMIDE 20 MG CAPS: 20 | 28 days supply | Qty: 5 | Fill #0

## 2018-06-14 NOTE — Telephone Encounter (Signed)
Oral Oncology Patient Advocate Encounter  Confirmed with [facility] that Temodar was shipped on 06/14/18 with a $0 copay. We also filled and shipped ondansetron and that had a $4.95 copay.   [facility] Patient Bryan [name] Phone [phone] Fax [phone] 06/14/2018   2:37 PM

## 2018-06-14 NOTE — Telephone Encounter (Signed)
Oral Oncology Patient Advocate Encounter  [facility] was not able to get the Temodar in the mail yesterday. They will be shipping it to the patient today for delivery tomorrow.   I called Juliann Pulse and let her know, she verbalized understanding and appreciation.  [place] Patient [place] Phone [phone] Fax [phone] 06/14/2018   11:47 AM

## 2018-06-16 MED FILL — LEVETIRACETAM 100 MG/ML SOL: 100 | 15 days supply | Qty: 473 | Fill #5

## 2018-06-16 MED FILL — ZONISAMIDE 100 MG CAPSULE: 100 | 30 days supply | Qty: 30 | Fill #1

## 2018-06-21 ENCOUNTER — Encounter: Payer: Self-pay | Admitting: Physical Medicine & Rehabilitation

## 2018-06-21 ENCOUNTER — Other Ambulatory Visit: Payer: Self-pay

## 2018-06-21 ENCOUNTER — Encounter: Payer: Medicare Other | Attending: Physical Medicine & Rehabilitation | Admitting: Physical Medicine & Rehabilitation

## 2018-06-21 VITALS — Ht 64.5 in | Wt 155.0 lb

## 2018-06-21 DIAGNOSIS — C719 Malignant neoplasm of brain, unspecified: Secondary | ICD-10-CM | POA: Insufficient documentation

## 2018-06-21 DIAGNOSIS — R4701 Aphasia: Secondary | ICD-10-CM

## 2018-06-21 NOTE — Progress Notes (Signed)
Subjective:    Patient ID: Bryan [name], male    DOB: [birth year], 78 y.o.   MRN: [id number]  HPI Due to national recommendations of social distancing due to COVID-19, an audio/video telehealth visit is deemed appropriate for this patient at this time  This is a follow-up encounter via telephone call with Mr. Bryan [name]. He is on temodar for treatment of his GBM. He completed XRT. Wife notes that he's been more fatigue and is dragging left leg more in general since completing XRT and initiating temodar. He's had three falls since starting the temodar but he's off it currently for the next 23 days until resuming next month potentially.  He maintains follow-up with Dr. Mickeal Skinner at [place]  He completed home health therapy but is not regular with his exercises.  He needs regular cueing from his wife to initiate.  He also does not want to use his walker any further as he had graduated to a cane.  He takes some convincing to use the walker when he feels tired.  He denies any frank pain at present.  He is sleeping fairly well.  Appetite is fair.    Pain Inventory Average Pain 0 Pain Right Now 0 My pain is no pain  In the last 24 hours, has pain interfered with the following? General activity 0 Relation with others 0 Enjoyment of life 0 What TIME of day is your pain at its worst? no pain Sleep (in general) Good  Pain is worse with: no pain Pain improves with: no pain Relief from Meds: no pain  Mobility walk with assistance use a walker ability to climb steps?  yes  Function retired  Neuro/Psych weakness trouble walking dizziness  Prior Studies Any changes since last visit?  yes  Physicians involved in your care Any changes since last visit?  no   Family History  Problem Relation Age of Onset  . Diabetes Mellitus II Mother   . ALS Father    Social History   Socioeconomic History  . Marital status: Married    Spouse name: Not on file  . Number of  children: Not on file  . Years of education: Not on file  . Highest education level: Not on file  Occupational History  . Not on file  Social Needs  . Financial resource strain: Not on file  . Food insecurity:    Worry: Not on file    Inability: Not on file  . Transportation needs:    Medical: Not on file    Non-medical: Not on file  Tobacco Use  . Smoking status: Former Smoker    Packs/day: 0.25    Years: 3.00    Pack years: 0.75    Types: Cigarettes    Last attempt to quit: 03/23/1961    Years since quitting: 57.2  . Smokeless tobacco: Never Used  Substance and Sexual Activity  . Alcohol use: Yes    Alcohol/week: 1.0 standard drinks    Types: 1 Glasses of wine per week    Comment: weekly  . Drug use: Never  . Sexual activity: Not Currently  Lifestyle  . Physical activity:    Days per week: Not on file    Minutes per session: Not on file  . Stress: Not on file  Relationships  . Social connections:    Talks on phone: Not on file    Gets together: Not on file    Attends religious service: Not on file  Active member of club or organization: Not on file    Attends meetings of clubs or organizations: Not on file    Relationship status: Not on file  Other Topics Concern  . Not on file  Social History Narrative   Married. Resides in [place].   Past Surgical History:  Procedure Laterality Date  . CRANIOTOMY    . HERNIA REPAIR    . 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 Arthritis   . Brain cancer ([place])    Glioblastoma  . Diabetes mellitus without complication ([place])   . GERD (gastroesophageal reflux disease)   . Hypercholesterolemia   . Hypertension   . Thyroid disease    Ht 5' 4.5" (1.638 m)   Wt 155 lb (70.3 kg)   BMI 26.19 kg/m   Opioid Risk Score:   Fall Risk Score:  `1  Depression screen PHQ 2/9  No flowsheet data found.  Review of Systems  Constitutional: Positive for  fatigue.  HENT: Negative.   Eyes: Negative.   Respiratory: Negative.   Cardiovascular: Negative.   Gastrointestinal: Negative.   Endocrine: Negative.   Genitourinary: Negative.   Musculoskeletal: Positive for gait problem.  Skin: Negative.   Allergic/Immunologic: Negative.   Neurological: Positive for dizziness and weakness.  All other systems reviewed and are negative.       Medical Problem List and Plan: 1.Decreased functional mobility with left side weakness and dysarthriasecondary to right insular mass. Status post craniotomy 03/07/2018. Pathology positive for glioblastoma -continue HEP===needs to be diligent about performing  -consider outpt PT and OT depending upon his response to treatment and our community health situation            -use walker for now or if at any time he's feeling weaker  -temodar per oncology team  2.   Pain Management:Tylenol as needed 3. Seizures.  Keppra    9. Hypertension.  bp under control.  10. Diabetes II: Glucophage 1000 mg twice a day.             -discussed cbg checks and improved control .  15  Minutes of time on phone was spent during this visit. All questions were encouraged and answered.  .follow up in 3 months with me.

## 2018-06-22 ENCOUNTER — Telehealth: Payer: Self-pay | Admitting: Urology

## 2018-06-22 ENCOUNTER — Ambulatory Visit: Payer: Medicare Other | Admitting: Urology

## 2018-06-22 NOTE — Telephone Encounter (Signed)
I called and spoke with the patient and his wife to conduct his routine scheduled 1 month follow up visit via telephone to spare the patient unnecessary potential exposure in the healthcare setting during the current COVID-19 pandemic.  The patient was notified in advance and gave permission to proceed with this visit format.  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   Radiation treatment dates:   04/04/2018 - 05/13/2018: 1. The initial enhancing tumor, peritumoral edema, plus 1 cm were treated to 44 Gy in 22 fractions. 2. The enhancing tumor plus 1 cm was boosted to 60 Gy with 8 additional fractions of 2 Gy.  Narrative: The patient tolerated radiation treatment relatively well. He reported improvement in gait, fine motor issues, speech, and pain as treatments went on. He denied nausea or vomiting and visual or auditory changes throughout treatment. He did experience fatigue and intermittent headaches throughout treatment as well as some taste change and decreased appetite.  Review of Systems: The patient and his wife report that they feel that he is making good progress over the past several weeks.  He did have some delayed radiation effect approximately 1 week after completing treatment where he experienced increased confusion, regression in his speech pattern and tremors in the left arm that resembled previous seizures he had experienced while in the hospital.  He was seen in follow-up with Dr. Mickeal Skinner on 05/24/2018 with recommendations for a stat CT head to rule out tumor site hemorrhage, infarct or significant mass affect.  Fortunately, the scan did not reveal any new intracranial abnormality and in fact demonstrated decreased regional mass-effect.  He was placed back on a short Decadron taper and added zonisamide 100 mg daily in addition to his Keppra 1500 mg p.o. twice daily.  They report significant improvement in his symptoms with this  regimen and he has not had any further issues since discontinuing the steroid taper.  His one-month follow-up MRI of the brain was performed on 06/03/2018 and showed what appears to be post radiation inflammatory changes, less likely organic tumor progression.  He has recently completed his first cycle of intermittent Temodar with 5 days on treatment and 23 days off for 28 day cycles. He tolerated this treatment well using Zofram 8mg  daily.  He did note some increased imbalance after the 3rd day of treatment but this has improved significantly, almost completely resolved at this point, now into the 3rd day off treatment.  Overall, they are quite pleased with his progress to date.  They have a planned follow-up visit with Dr. Mickeal Skinner on 07/11/2018 prior to starting his next 28 day cycle of Temodar.    Plan:  He will be due for a repeat MRI brain scan in May 2020 and they are reassured that Dr. Tammi Klippel will continue to participate in the review and discussion of his scans at upcoming multidisciplinary brain conferences going forward.  We discussed that while we are happy to continue to participate in his care if clinically indicated, at this point, we will plan to see him back on an as-needed basis.  He will continue in routine follow-up under the care and direction of Dr. Mickeal Skinner.  They appear to have a good understanding of these recommendations and are comfortable and in agreement with the stated plan.  They know to call us at anytime with any questions or concerns related to his previous radiation.  Nicholos Johns, MMS, PA-C [facility] [place]: [phone]   Fax: [phone]

## 2018-07-02 MED FILL — PANTOPRAZOLE SOD DR 40 MG T: 40 | 30 days supply | Qty: 30 | Fill #1

## 2018-07-02 MED FILL — LEVETIRACETAM 100 MG/ML SOL: 100 | 15 days supply | Qty: 473 | Fill #6

## 2018-07-11 ENCOUNTER — Inpatient Hospital Stay: Payer: Medicare Other

## 2018-07-11 ENCOUNTER — Inpatient Hospital Stay: Payer: Medicare Other | Attending: Radiation Oncology | Admitting: Internal Medicine

## 2018-07-11 ENCOUNTER — Telehealth: Payer: Self-pay | Admitting: Internal Medicine

## 2018-07-11 ENCOUNTER — Other Ambulatory Visit: Payer: Self-pay

## 2018-07-11 VITALS — BP 116/73 | HR 100 | Temp 97.8°F | Resp 18 | Ht 64.5 in | Wt 145.5 lb

## 2018-07-11 DIAGNOSIS — E7800 Pure hypercholesterolemia, unspecified: Secondary | ICD-10-CM | POA: Insufficient documentation

## 2018-07-11 DIAGNOSIS — Z7951 Long term (current) use of inhaled steroids: Secondary | ICD-10-CM | POA: Diagnosis not present

## 2018-07-11 DIAGNOSIS — E119 Type 2 diabetes mellitus without complications: Secondary | ICD-10-CM | POA: Insufficient documentation

## 2018-07-11 DIAGNOSIS — Z87891 Personal history of nicotine dependence: Secondary | ICD-10-CM | POA: Diagnosis not present

## 2018-07-11 DIAGNOSIS — C711 Malignant neoplasm of frontal lobe: Secondary | ICD-10-CM | POA: Diagnosis not present

## 2018-07-11 DIAGNOSIS — R634 Abnormal weight loss: Secondary | ICD-10-CM

## 2018-07-11 DIAGNOSIS — Z923 Personal history of irradiation: Secondary | ICD-10-CM | POA: Diagnosis not present

## 2018-07-11 DIAGNOSIS — R5383 Other fatigue: Secondary | ICD-10-CM | POA: Diagnosis not present

## 2018-07-11 DIAGNOSIS — R112 Nausea with vomiting, unspecified: Secondary | ICD-10-CM | POA: Diagnosis not present

## 2018-07-11 DIAGNOSIS — M1990 Unspecified osteoarthritis, unspecified site: Secondary | ICD-10-CM | POA: Diagnosis not present

## 2018-07-11 DIAGNOSIS — Z7984 Long term (current) use of oral hypoglycemic drugs: Secondary | ICD-10-CM | POA: Insufficient documentation

## 2018-07-11 DIAGNOSIS — Z9221 Personal history of antineoplastic chemotherapy: Secondary | ICD-10-CM | POA: Diagnosis not present

## 2018-07-11 DIAGNOSIS — Z79899 Other long term (current) drug therapy: Secondary | ICD-10-CM | POA: Diagnosis not present

## 2018-07-11 DIAGNOSIS — I10 Essential (primary) hypertension: Secondary | ICD-10-CM | POA: Diagnosis not present

## 2018-07-11 LAB — CMP (CANCER CENTER ONLY)
ALT: 8 U/L (ref 0–44)
AST: 11 U/L — ABNORMAL LOW (ref 15–41)
Albumin: 4 g/dL (ref 3.5–5.0)
Alkaline Phosphatase: 51 U/L (ref 38–126)
Anion gap: 9 (ref 5–15)
BUN: 14 mg/dL (ref 8–23)
CO2: 24 mmol/L (ref 22–32)
Calcium: 9 mg/dL (ref 8.9–10.3)
Chloride: 104 mmol/L (ref 98–111)
Creatinine: 1.2 mg/dL (ref 0.61–1.24)
GFR, Est AFR Am: >60 mL/min (ref >60)
GFR, Estimated: 58 mL/min — ABNORMAL LOW (ref >60)
Glucose, Bld: 154 mg/dL — ABNORMAL HIGH (ref 70–99)
Potassium: 3.9 mmol/L (ref 3.5–5.1)
Sodium: 137 mmol/L (ref 135–145)
Total Bilirubin: 0.4 mg/dL (ref 0.3–1.2)
Total Protein: 6.4 g/dL — ABNORMAL LOW (ref 6.5–8.1)

## 2018-07-11 LAB — CBC WITH DIFFERENTIAL (CANCER CENTER ONLY)
Abs Immature Granulocytes: 0.01 10*3/uL (ref 0.00–0.07)
Basophils Absolute: 0 10*3/uL (ref 0.0–0.1)
Basophils Relative: 1 %
Eosinophils Absolute: 0.1 10*3/uL (ref 0.0–0.5)
Eosinophils Relative: 2 %
HCT: 34.6 % — ABNORMAL LOW (ref 39.0–52.0)
Hemoglobin: 11.9 g/dL — ABNORMAL LOW (ref 13.0–17.0)
Immature Granulocytes: 0 %
Lymphocytes Relative: 17 %
Lymphs Abs: 0.7 10*3/uL (ref 0.7–4.0)
MCH: 32.2 pg (ref 26.0–34.0)
MCHC: 34.4 g/dL (ref 30.0–36.0)
MCV: 93.8 fL (ref 80.0–100.0)
Monocytes Absolute: 0.5 10*3/uL (ref 0.1–1.0)
Monocytes Relative: 13 %
Neutro Abs: 2.7 10*3/uL (ref 1.7–7.7)
Neutrophils Relative %: 67 %
Platelet Count: 151 10*3/uL (ref 150–400)
RBC: 3.69 MIL/uL — ABNORMAL LOW (ref 4.22–5.81)
RDW: 12 % (ref 11.5–15.5)
WBC Count: 4 10*3/uL (ref 4.0–10.5)
nRBC: 0 % (ref 0.0–0.2)

## 2018-07-11 MED ORDER — DEXAMETHASONE 1 MG PO TABS: 1.0000 mg | ORAL_TABLET | Freq: Two times a day (BID) | ORAL | 0 refills | Status: DC

## 2018-07-11 NOTE — Telephone Encounter (Signed)
Scheduled appt per 4/20 los. °

## 2018-07-11 NOTE — Progress Notes (Signed)
[facility], [place] [address] [phone]   Interval Evaluation  Date of Service: 07/11/18 Patient Name: Bryan [name] Patient MRN: [id number] Patient DOB: 04/14/1940 Provider: Ventura Sellers, MD  Identifying Statement:  Bryan [name] is a 78 y.o. male with right frontal glioblastoma   Oncologic History:    Glioblastoma, IDH-wildtype ([place])    03/07/2018 Surgery    Craniotomy, resection by Dr. Tommi Rumps ([facility]).  Path is Glioblastoma    03/29/2018 - 04/04/2018 Chemotherapy    The patient had [No matching medication found in this treatment plan]  for chemotherapy treatment.     04/04/2018 - 05/13/2018 Radiation Therapy    IMRT and concurrent Temozolomide with Dr. Tammi Klippel    06/09/2018 -  Chemotherapy    The patient had [No matching medication found in this treatment plan]  for chemotherapy treatment.      Biomarkers:  MGMT Unknown.  IDH 1/2 Wild type.  EGFR Expressed  TERT Unknown   Interval History: [name] presents today for follow up after completing second cycle of temodar.  He and his wife describe recent GI issues, characterized by near daily nausea/vomiting starting the third night of 5-day temodar.  There has otherwise been no fever, systemic symptoms, diarrhea.  He has not been taking any anti-emetic since completing chemo.  There has been weight loss in the past month, perhaps as much as 10 lbs.  Fatigue continues to be problematic and there are days he is sleeping 16 hours or more.  Notably, speaking/fluency seems a little improved. History is overall still limited by language impairments today.    H+P (02/16/18) Bryan [name] 78 y.o. male presented after family noted drooping of the left side of his face yesterday.  In addition, he/they describe some cognitive changes, such as difficulty "finding words" in certain situations.  The patient also feels like his left leg is "lagging" a little behind  his right lately.  He is walking on his own, however, and with no falls.  Denies headaches, seizures.   Medications: Current Outpatient Medications on File Prior to Visit  Medication Sig Dispense Refill  . acetaminophen (TYLENOL) 325 MG tablet Take 2 tablets (650 mg total) by mouth every 6 (six) hours as needed for mild pain.    [name] atorvastatin (LIPITOR) 40 MG tablet Take 1 tablet (40 mg total) by mouth daily. 30 tablet 0  . dexamethasone (DECADRON) 4 MG tablet Take 1 tablet (4 mg total) by mouth 2 (two) times daily. 30 tablet 0  . Flaxseed, Linseed, (FLAXSEED OIL PO) Take by mouth.    . fluticasone (FLONASE) 50 MCG/ACT nasal spray Place 1 spray into both nostrils 2 (two) times daily.    . Ginkgo Biloba Extract (GINKGO BILOBA MEMORY ENHANCER) 60 MG CAPS Take by mouth.    . levETIRAcetam (KEPPRA) 100 MG/ML solution Take 15 mLs (1,500 mg total) by mouth 2 (two) times daily. 473 mL 12  . levothyroxine (SYNTHROID, LEVOTHROID) 75 MCG tablet Take 1 tablet (75 mcg total) by mouth daily before breakfast. 30 tablet 0  . lisinopril (PRINIVIL,ZESTRIL) 5 MG tablet Take 3 tablets (15 mg total) by mouth daily. 30 tablet 1  . metFORMIN (GLUCOPHAGE) 1000 MG tablet Take 1 tablet (1,000 mg total) by mouth 2 (two) times daily with a meal. 60 tablet 1  . Multiple Vitamins-Minerals (MULTI-DAY PLUS MINERALS) TABS Take 1 tablet by mouth daily.    . naphazoline-pheniramine (NAPHCON-A) 0.025-0.3 % ophthalmic solution Place  1 drop into both eyes as needed for eye irritation or allergies.    . Omega-3 1000 MG CAPS Take by mouth.    . ondansetron (ZOFRAN) 4 MG tablet Take 1 tablet (4 mg total) by mouth every 6 (six) hours as needed for nausea or vomiting. 30 tablet 1  . ondansetron (ZOFRAN) 8 MG tablet Take 1 tablet (8 mg total) by mouth 2 (two) times daily as needed (nausea and vomiting). take 30-60 minutes prior to Temodar 30 tablet 1  . pantoprazole (PROTONIX) 40 MG tablet Take 1 tablet (40 mg total) by mouth daily. 30  tablet 3  . temozolomide (TEMODAR) 100 MG capsule Take 1 capsule (100 mg total) by mouth daily. Take for 5 days on, 23 d off, repeat every 28d. May take on an empty stomach to decrease N/V 5 capsule 0  . temozolomide (TEMODAR) 140 MG capsule Take 1 capsule (140 mg total) by mouth daily. Take for 5 days on, 23 d off, repeat every 28d. May take on an empty stomach to decrease N/V 5 capsule 0  . temozolomide (TEMODAR) 20 MG capsule Take 1 capsule (20 mg total) by mouth daily. Take for 5 days on, 23 days off, repeat every 28d. May take on an empty stomach to decrease N/V 5 capsule 0  . zonisamide (ZONEGRAN) 100 MG capsule Take 1 capsule (100 mg total) by mouth daily. 30 capsule 3   No current facility-administered medications on file prior to visit.     Allergies:  Allergies  Allergen Reactions  . Other     Cats, pollen   Past Medical History:  Past Medical History:  Diagnosis Date  . Arthritis   . Brain cancer ([place])    Glioblastoma  . Diabetes mellitus without complication ([place])   . GERD (gastroesophageal reflux disease)   . Hypercholesterolemia   . Hypertension   . Thyroid disease    Past Surgical History:  Past Surgical History:  Procedure Laterality Date  . CRANIOTOMY    . HERNIA REPAIR    . 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 Marital status: Married    Spouse name: Not on file  . Number of children: Not on file  . Years of education: Not on file  . Highest education level: Not on file  Occupational History  . Not on file  Social Needs  . Financial resource strain: Not on file  . Food insecurity:    Worry: Not on file    Inability: Not on file  . Transportation needs:    Medical: Not on file    Non-medical: Not on file  Tobacco Use  . Smoking status: Former Smoker    Packs/day: 0.25    Years: 3.00    Pack years: 0.75    Types: Cigarettes    Last attempt to quit:  03/23/1961    Years since quitting: 57.3  . Smokeless tobacco: Never Used  Substance and Sexual Activity  . Alcohol use: Yes    Alcohol/week: 1.0 standard drinks    Types: 1 Glasses of wine per week    Comment: weekly  . Drug use: Never  . Sexual activity: Not Currently  Lifestyle  . Physical activity:    Days per week: Not on file    Minutes per session: Not on file  . Stress: Not on file  Relationships  . Social connections:  Talks on phone: Not on file    Gets together: Not on file    Attends religious service: Not on file    Active member of club or organization: Not on file    Attends meetings of clubs or organizations: Not on file    Relationship status: Not on file  . Intimate partner violence:    Fear of current or ex partner: Not on file    Emotionally abused: Not on file    Physically abused: Not on file    Forced sexual activity: Not on file  Other Topics Concern  . Not on file  Social History Narrative   Married. Resides in [place].   Family History:  Family History  Problem Relation Age of Onset  . Diabetes Mellitus II Mother   . ALS Father     Review of Systems: Constitutional: Denies fevers, chills or abnormal weight loss Eyes: Denies blurriness of vision Ears, nose, mouth, throat, and face: Denies mucositis or sore throat Respiratory: Denies cough, dyspnea or wheezes Cardiovascular: Denies palpitation, chest discomfort or lower extremity swelling Gastrointestinal:  Denies nausea, constipation, diarrhea GU: Denies dysuria or incontinence Skin: Denies abnormal skin rashes Neurological: Per HPI Musculoskeletal: Denies joint pain, back or neck discomfort. No decrease in ROM Behavioral/Psych: Denies anxiety, disturbance in thought content, and mood instability  Physical Exam: Vitals:   07/11/18 1156  BP: 116/73  Pulse: 100  Resp: 18  Temp: 97.8 F (36.6 C)  SpO2: 98%    KPS: 70. General: Alert, cooperative, pleasant, in no acute distress  Head: Craniotomy scar noted, dry and intact. EENT: No conjunctival injection or scleral icterus. Oral mucosa moist Lungs: Resp effort normal Cardiac: Regular rate and rhythm Abdomen: Soft, non-distended abdomen Skin: No rashes cyanosis or petechiae. Extremities: no edema  Neurologic Exam: Mental Status: Awake, alert, attentive to examiner. Oriented to self and environment. Language is fairly densely impaired with regards to fluency, comprehension and repetition.   Cranial Nerves: Visual acuity is grossly normal. Visual fields are full. Extra-ocular movements intact. No ptosis. L UMN facial paresis, tongue midline. Motor: Tone and bulk are normal. Power is 4+/5 in left arm and 4+/5 in leg, with impaired fine motor function in left hand. Reflexes are symmetric, no pathologic reflexes present. Intact finger to nose bilaterally Sensory: Intact to light touch and temperature Gait: Deferred  Labs: I have reviewed the data as listed    Component Value Date/Time   NA 140 05/12/2018 1325   K 3.8 05/12/2018 1325   CL 106 05/12/2018 1325   CO2 24 05/12/2018 1325   GLUCOSE 150 (H) 05/12/2018 1325   BUN 8 05/12/2018 1325   CREATININE 0.91 05/12/2018 1325   CALCIUM 9.2 05/12/2018 1325   PROT 6.2 (L) 05/12/2018 1325   ALBUMIN 3.7 05/12/2018 1325   AST 13 (L) 05/12/2018 1325   ALT 12 05/12/2018 1325   ALKPHOS 51 05/12/2018 1325   BILITOT 0.6 05/12/2018 1325   GFRNONAA >60 05/12/2018 1325   GFRAA >60 05/12/2018 1325   Lab Results  Component Value Date   WBC 4.0 07/11/2018   NEUTROABS 2.7 07/11/2018   HGB 11.9 (L) 07/11/2018   HCT 34.6 (L) 07/11/2018   MCV 93.8 07/11/2018   PLT 151 07/11/2018     Assessment/Plan 1.  Glioblastoma, IDH-wildtype [facility])   Mr. Prabhakar demonstrates clinical changes following cycle #2 of 5-day Temozolomide.  Although functional status and language status are preserved, new onset refractory nausea has led to unintentional weight loss and depleted  energy.   We recommended resuming dexamethasone as such:  32m daily x3 days, followed by 164mdaily therafter.     Decadron may be helpful for both nausea and appetite loss.  Furthermore, zofran should be initiated more liberally (PRN) and for now should be given as prophylaxis prior to dinner until symptoms improve.  Temozolomide will be held until confirmation of weight loss reversal.  Referral will be placed for nutrition counseling.  We asked him to return to clinic in 2 weeks for further evaluation.  We appreciate the opportunity to participate in the care of [facility]band treatment plan were [facility].  The total time spent in the encounter was 25 minutes and more than 50% was on counseling and review of test results   ZaVentura SellersMD Medical Director of Neuro-Oncology [facility] Bernice4/20/20 12:01 PM

## 2018-07-12 MED FILL — DEXAMETHASONE 1 MG TABLET: 1 | 15 days supply | Qty: 30 | Fill #0

## 2018-07-13 ENCOUNTER — Telehealth: Payer: Self-pay | Admitting: Internal Medicine

## 2018-07-13 NOTE — Telephone Encounter (Signed)
Scheduled appt per 4/21 sch message - unable to reach patient . Left message with appt date and time  

## 2018-07-15 ENCOUNTER — Inpatient Hospital Stay: Payer: Medicare Other | Admitting: Nutrition

## 2018-07-15 NOTE — Progress Notes (Signed)
RD working remotely.  78 year old male diagnosed with Glioblastoma s/p craniotomy.  PMH includes Arthritis, DM, GERD, hypercholesterolemia, HTN, Thyroid disease.  Medications include Temodar, Lipitor, Decadron, Flax seed oil, Ginko Biloba, Synthroid, Glucophage, MVI, Omega 3 fatty acids, Zofran, Protonix  Labs include Glucose of 154.  Height: 64.5 inches. Weight: 145.5 pounds. UBW: 165 pounds. BMI: 24.59.  Spoke with patient and wife over phone.  Patient is having nausea, mostly from the smell of food. Reports taste alterations. He has a poor appetite and has lost about 20# over 2 months. Wife is making smoothies for patient and he has tried Community education officer with some success.  Nutrition Diagnosis: Unintended weight loss related to Glioblastoma and associated treatments as evidenced by 12% weight loss in 2 months which is significant.  Intervention: Educated on strategies for improving nausea and vomiting. Encouraged nausea medications as directed by MD. Venita Lick on strategies for improved taste. Encouraged small, frequent meals and snacks. Recommended Ensure Enlive between meals. Encouraged patient to try it and then offered a complimentary case if he likes it. They will let me know before they pick up. Fact sheets were emailed to patient. Questions answered and teach back method used.  Monitoring, Evaluation, Goals: Increase oral intake to minimize further weight loss.  Next Visit: Wednesday, May 6.

## 2018-07-19 MED FILL — ZONISAMIDE 100 MG CAPSULE: 100 | 30 days supply | Qty: 30 | Fill #2

## 2018-07-19 MED FILL — LEVETIRACETAM 100 MG/ML SOL: 100 | 15 days supply | Qty: 473 | Fill #7

## 2018-07-25 ENCOUNTER — Inpatient Hospital Stay: Payer: Medicare Other | Attending: Radiation Oncology | Admitting: Internal Medicine

## 2018-07-25 ENCOUNTER — Other Ambulatory Visit: Payer: Self-pay

## 2018-07-25 ENCOUNTER — Telehealth: Payer: Self-pay | Admitting: Internal Medicine

## 2018-07-25 VITALS — BP 137/65 | HR 79 | Temp 98.0°F | Resp 18 | Ht 64.5 in | Wt 142.4 lb

## 2018-07-25 DIAGNOSIS — I10 Essential (primary) hypertension: Secondary | ICD-10-CM | POA: Diagnosis not present

## 2018-07-25 DIAGNOSIS — Z7951 Long term (current) use of inhaled steroids: Secondary | ICD-10-CM | POA: Diagnosis not present

## 2018-07-25 DIAGNOSIS — E119 Type 2 diabetes mellitus without complications: Secondary | ICD-10-CM | POA: Diagnosis not present

## 2018-07-25 DIAGNOSIS — Z923 Personal history of irradiation: Secondary | ICD-10-CM | POA: Diagnosis not present

## 2018-07-25 DIAGNOSIS — Z9221 Personal history of antineoplastic chemotherapy: Secondary | ICD-10-CM | POA: Diagnosis not present

## 2018-07-25 DIAGNOSIS — E7800 Pure hypercholesterolemia, unspecified: Secondary | ICD-10-CM

## 2018-07-25 DIAGNOSIS — Z7984 Long term (current) use of oral hypoglycemic drugs: Secondary | ICD-10-CM | POA: Diagnosis not present

## 2018-07-25 DIAGNOSIS — R634 Abnormal weight loss: Secondary | ICD-10-CM | POA: Insufficient documentation

## 2018-07-25 DIAGNOSIS — C711 Malignant neoplasm of frontal lobe: Secondary | ICD-10-CM | POA: Insufficient documentation

## 2018-07-25 DIAGNOSIS — K219 Gastro-esophageal reflux disease without esophagitis: Secondary | ICD-10-CM | POA: Insufficient documentation

## 2018-07-25 DIAGNOSIS — Z87891 Personal history of nicotine dependence: Secondary | ICD-10-CM | POA: Diagnosis not present

## 2018-07-25 DIAGNOSIS — R5383 Other fatigue: Secondary | ICD-10-CM | POA: Diagnosis not present

## 2018-07-25 DIAGNOSIS — M1990 Unspecified osteoarthritis, unspecified site: Secondary | ICD-10-CM | POA: Insufficient documentation

## 2018-07-25 DIAGNOSIS — Z79899 Other long term (current) drug therapy: Secondary | ICD-10-CM | POA: Diagnosis not present

## 2018-07-25 DIAGNOSIS — Z85828 Personal history of other malignant neoplasm of skin: Secondary | ICD-10-CM | POA: Diagnosis not present

## 2018-07-25 MED ORDER — DEXAMETHASONE 1 MG PO TABS: 1.0000 mg | ORAL_TABLET | Freq: Two times a day (BID) | ORAL | 3 refills | Status: DC

## 2018-07-25 NOTE — Telephone Encounter (Signed)
Scheduled appt per 5/4 los.  Patient and patient wife aware of appt date and time.

## 2018-07-25 NOTE — Progress Notes (Signed)
[facility] [place], [place] [address] [phone]   Interval Evaluation  Date of Service: 07/25/18 Patient Name: Bryan [name] Patient MRN: [id number] Patient DOB: 08/04/1940 Provider: Ventura Sellers, MD  Identifying Statement:  Bryan [name] is a 78 y.o. male with right frontal glioblastoma   Oncologic History:    Glioblastoma, IDH-wildtype ([place])    03/07/2018 Surgery    Craniotomy, resection by Dr. Tommi Rumps ([facility]).  Path is Glioblastoma    03/29/2018 - 04/04/2018 Chemotherapy    The patient had [No matching medication found in this treatment plan]  for chemotherapy treatment.     04/04/2018 - 05/13/2018 Radiation Therapy    IMRT and concurrent Temozolomide with Dr. Tammi Klippel    06/09/2018 -  Chemotherapy    The patient had [No matching medication found in this treatment plan]  for chemotherapy treatment.      Biomarkers:  MGMT Unknown.  IDH 1/2 Wild type.  EGFR Expressed  TERT Unknown   Interval History: Bryan [name] presents today for follow up after recent clinical changes following cycle #2 of 5-day TMZ.  He and his wife describe improvement in appetite, although he has lost 3 additional pounds over the last 2 weeks.  Energy is "a little improved".  GI upset, nausea, have not recurred.  Speaking/fluency is stable, if not slightly worse. History is overall still limited by language impairments.    H+P (02/16/18) Bryan [name] 78 y.o. male presented after family noted drooping of the left side of his face yesterday.  In addition, he/they describe some cognitive changes, such as difficulty "finding words" in certain situations.  The patient also feels like his left leg is "lagging" a little behind his right lately.  He is walking on his own, however, and with no falls.  Denies headaches, seizures.   Medications: Current Outpatient Medications on File Prior to Visit  Medication Sig Dispense Refill  . acetaminophen  (TYLENOL) 325 MG tablet Take 2 tablets (650 mg total) by mouth every 6 (six) hours as needed for mild pain.    [name] atorvastatin (LIPITOR) 40 MG tablet Take 1 tablet (40 mg total) by mouth daily. 30 tablet 0  . dexamethasone (DECADRON) 1 MG tablet Take 1 tablet (1 mg total) by mouth 2 (two) times daily. 30 tablet 0  . Flaxseed, Linseed, (FLAXSEED OIL PO) Take by mouth.    . fluticasone (FLONASE) 50 MCG/ACT nasal spray Place 1 spray into both nostrils 2 (two) times daily.    . Ginkgo Biloba Extract (GINKGO BILOBA MEMORY ENHANCER) 60 MG CAPS Take by mouth.    . levETIRAcetam (KEPPRA) 100 MG/ML solution Take 15 mLs (1,500 mg total) by mouth 2 (two) times daily. 473 mL 12  . levothyroxine (SYNTHROID, LEVOTHROID) 75 MCG tablet Take 1 tablet (75 mcg total) by mouth daily before breakfast. 30 tablet 0  . lisinopril (PRINIVIL,ZESTRIL) 5 MG tablet Take 3 tablets (15 mg total) by mouth daily. 30 tablet 1  . metFORMIN (GLUCOPHAGE) 1000 MG tablet Take 1 tablet (1,000 mg total) by mouth 2 (two) times daily with a meal. 60 tablet 1  . Multiple Vitamins-Minerals (MULTI-DAY PLUS MINERALS) TABS Take 1 tablet by mouth daily.    . naphazoline-pheniramine (NAPHCON-A) 0.025-0.3 % ophthalmic solution Place 1 drop into both eyes as needed for eye irritation or allergies.    . Omega-3 1000 MG CAPS Take by mouth.    . ondansetron (ZOFRAN) 4 MG tablet Take 1 tablet (4 mg  total) by mouth every 6 (six) hours as needed for nausea or vomiting. 30 tablet 1  . ondansetron (ZOFRAN) 8 MG tablet Take 1 tablet (8 mg total) by mouth 2 (two) times daily as needed (nausea and vomiting). take 30-60 minutes prior to Temodar 30 tablet 1  . pantoprazole (PROTONIX) 40 MG tablet Take 1 tablet (40 mg total) by mouth daily. 30 tablet 3  . temozolomide (TEMODAR) 100 MG capsule Take 1 capsule (100 mg total) by mouth daily. Take for 5 days on, 23 d off, repeat every 28d. May take on an empty stomach to decrease N/V 5 capsule 0  . temozolomide  (TEMODAR) 140 MG capsule Take 1 capsule (140 mg total) by mouth daily. Take for 5 days on, 23 d off, repeat every 28d. May take on an empty stomach to decrease N/V 5 capsule 0  . temozolomide (TEMODAR) 20 MG capsule Take 1 capsule (20 mg total) by mouth daily. Take for 5 days on, 23 days off, repeat every 28d. May take on an empty stomach to decrease N/V 5 capsule 0  . zonisamide (ZONEGRAN) 100 MG capsule Take 1 capsule (100 mg total) by mouth daily. 30 capsule 3   No current facility-administered medications on file prior to visit.     Allergies:  Allergies  Allergen Reactions  . Other     Cats, pollen   Past Medical History:  Past Medical History:  Diagnosis Date  . Arthritis   . Brain cancer ([place])    Glioblastoma  . Diabetes mellitus without complication ([place])   . GERD (gastroesophageal reflux disease)   . Hypercholesterolemia   . Hypertension   . Thyroid disease    Past Surgical History:  Past Surgical History:  Procedure Laterality Date  . CRANIOTOMY    . HERNIA REPAIR    . 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 Marital status: Married    Spouse name: Not on file  . Number of children: Not on file  . Years of education: Not on file  . Highest education level: Not on file  Occupational History  . Not on file  Social Needs  . Financial resource strain: Not on file  . Food insecurity:    Worry: Not on file    Inability: Not on file  . Transportation needs:    Medical: Not on file    Non-medical: Not on file  Tobacco Use  . Smoking status: Former Smoker    Packs/day: 0.25    Years: 3.00    Pack years: 0.75    Types: Cigarettes    Last attempt to quit: 03/23/1961    Years since quitting: 57.3  . Smokeless tobacco: Never Used  Substance and Sexual Activity  . Alcohol use: Yes    Alcohol/week: 1.0 standard drinks    Types: 1 Glasses of wine per week    Comment: weekly   . Drug use: Never  . Sexual activity: Not Currently  Lifestyle  . Physical activity:    Days per week: Not on file    Minutes per session: Not on file  . Stress: Not on file  Relationships  . Social connections:    Talks on phone: Not on file    Gets together: Not on file    Attends religious service: Not on file    Active member of club or organization: Not on file  Attends meetings of clubs or organizations: Not on file    Relationship status: Not on file  . Intimate partner violence:    Fear of current or ex partner: Not on file    Emotionally abused: Not on file    Physically abused: Not on file    Forced sexual activity: Not on file  Other Topics Concern  . Not on file  Social History Narrative   Married. Resides in [place].   Family History:  Family History  Problem Relation Age of Onset  . Diabetes Mellitus II Mother   . ALS Father     Review of Systems: Constitutional: Denies fevers, chills or abnormal weight loss Eyes: Denies blurriness of vision Ears, nose, mouth, throat, and face: Denies mucositis or sore throat Respiratory: Denies cough, dyspnea or wheezes Cardiovascular: Denies palpitation, chest discomfort or lower extremity swelling Gastrointestinal:  Denies nausea, constipation, diarrhea GU: Denies dysuria or incontinence Skin: Denies abnormal skin rashes Neurological: Per HPI Musculoskeletal: Denies joint pain, back or neck discomfort. No decrease in ROM Behavioral/Psych: Denies anxiety, disturbance in thought content, and mood instability  Physical Exam: Vitals:   07/25/18 1126  BP: 137/65  Pulse: 79  Resp: 18  Temp: 98 F (36.7 C)  SpO2: 100%    KPS: 70. General: Alert, cooperative, pleasant, in no acute distress Head: Craniotomy scar noted, dry and intact. EENT: No conjunctival injection or scleral icterus. Oral mucosa moist Lungs: Resp effort normal Cardiac: Regular rate and rhythm Abdomen: Soft, non-distended abdomen Skin: No  rashes cyanosis or petechiae. Extremities: no edema  Neurologic Exam: Mental Status: Awake, alert, attentive to examiner. Oriented to self and environment. Language is fairly densely impaired with regards to fluency, comprehension and repetition.   Cranial Nerves: Visual acuity is grossly normal. Visual fields are full. Extra-ocular movements intact. No ptosis. L UMN facial paresis, tongue midline. Motor: Tone and bulk are normal. Power is 4+/5 in left arm and 4+/5 in leg, with impaired fine motor function in left hand. Reflexes are symmetric, no pathologic reflexes present. Intact finger to nose bilaterally Sensory: Intact to light touch and temperature Gait: Deferred  Labs: I have reviewed the data as listed    Component Value Date/Time   NA 137 07/11/2018 1132   K 3.9 07/11/2018 1132   CL 104 07/11/2018 1132   CO2 24 07/11/2018 1132   GLUCOSE 154 (H) 07/11/2018 1132   BUN 14 07/11/2018 1132   CREATININE 1.20 07/11/2018 1132   CALCIUM 9.0 07/11/2018 1132   PROT 6.4 (L) 07/11/2018 1132   ALBUMIN 4.0 07/11/2018 1132   AST 11 (L) 07/11/2018 1132   ALT 8 07/11/2018 1132   ALKPHOS 51 07/11/2018 1132   BILITOT 0.4 07/11/2018 1132   GFRNONAA 58 (L) 07/11/2018 1132   GFRAA >60 07/11/2018 1132   Lab Results  Component Value Date   WBC 4.0 07/11/2018   NEUTROABS 2.7 07/11/2018   HGB 11.9 (L) 07/11/2018   HCT 34.6 (L) 07/11/2018   MCV 93.8 07/11/2018   PLT 151 07/11/2018     Assessment/Plan 1.  Glioblastoma, IDH-wildtype [facility])   Bryan [name] is clinically stable today, after various clinical complaints two weeks prior.  Unfortunately he has continued to lose weight and we shared concerns about re-dosing with TMZ this week.  We recommended continuing dexamethasone 33m BID.  Continue liberal use of zofran.  We asked him to return to clinic in 2 weeks with an MRI brain for evaluation prior to resuming chemotherapy.  We appreciate the  opportunity to participate in the care of  Bryan [name].   The total time spent in the encounter was 25 minutes and more than 50% was on counseling and review of test results   Ventura Sellers, MD Medical Director of Neuro-Oncology [facility] [place] 07/25/18 11:46 AM

## 2018-07-27 ENCOUNTER — Inpatient Hospital Stay: Payer: Medicare Other | Admitting: Nutrition

## 2018-07-27 NOTE — Progress Notes (Signed)
RD working remotely.  Nutrition follow-up completed with patient and wife.  Patient is receiving treatment for glioblastoma status post craniotomy.  Current weight documented as 142.4 pounds May 4 down from 145.5 pounds April 20. Patient reports his appetite is much improved. Wife endorses patient's oral intake has increased. She continues to make milkshakes with milk, cottage cheese, and fruit. Patient did not care for Ensure Enlive. He denies other nutrition impact symptoms.  Nutrition diagnosis: Unintended weight loss continues.  Intervention: Patient was educated to continue strategies to decrease nausea and increase oral intake. Recommended high-protein shakes at least once a day. Questions were answered.  Monitoring, evaluation, goals: Patient will continue increased oral intake to minimize weight loss.  Next visit: Patient will contact me for follow-up as needed.

## 2018-08-05 MED FILL — PANTOPRAZOLE SOD DR 40 MG T: 40 | 30 days supply | Qty: 30 | Fill #2

## 2018-08-08 ENCOUNTER — Other Ambulatory Visit: Payer: Medicare Other

## 2018-08-08 ENCOUNTER — Ambulatory Visit: Payer: Medicare Other | Admitting: Internal Medicine

## 2018-08-09 ENCOUNTER — Other Ambulatory Visit: Payer: Self-pay

## 2018-08-09 ENCOUNTER — Ambulatory Visit
Admission: RE | Admit: 2018-08-09 | Discharge: 2018-08-09 | Disposition: A | Discharge status: Home / Self Care | Payer: Medicare Other | Source: Ambulatory Visit | Attending: Internal Medicine | Admitting: Internal Medicine

## 2018-08-09 DIAGNOSIS — C711 Malignant neoplasm of frontal lobe: Secondary | ICD-10-CM

## 2018-08-09 MED ORDER — GADOBENATE DIMEGLUMINE 529 MG/ML IV SOLN
13.0000 mL | Freq: Once | INTRAVENOUS | Status: AC | PRN
  Administered 2018-08-09: 13 mL via INTRAVENOUS

## 2018-08-10 MED FILL — LEVETIRACETAM 100 MG/ML SOL: 100 | 15 days supply | Qty: 473 | Fill #8

## 2018-08-11 ENCOUNTER — Ambulatory Visit: Payer: Medicare Other | Admitting: Internal Medicine

## 2018-08-11 ENCOUNTER — Other Ambulatory Visit: Payer: Medicare Other

## 2018-08-12 ENCOUNTER — Inpatient Hospital Stay: Payer: Medicare Other

## 2018-08-12 ENCOUNTER — Other Ambulatory Visit: Payer: Self-pay

## 2018-08-12 ENCOUNTER — Other Ambulatory Visit: Payer: Medicare Other

## 2018-08-12 ENCOUNTER — Inpatient Hospital Stay (HOSPITAL_BASED_OUTPATIENT_CLINIC_OR_DEPARTMENT_OTHER): Payer: Medicare Other | Admitting: Internal Medicine

## 2018-08-12 VITALS — BP 139/67 | HR 80 | Temp 98.1°F | Resp 18 | Ht 64.5 in | Wt 145.8 lb

## 2018-08-12 DIAGNOSIS — R634 Abnormal weight loss: Secondary | ICD-10-CM | POA: Diagnosis not present

## 2018-08-12 DIAGNOSIS — C711 Malignant neoplasm of frontal lobe: Secondary | ICD-10-CM

## 2018-08-12 DIAGNOSIS — M1990 Unspecified osteoarthritis, unspecified site: Secondary | ICD-10-CM

## 2018-08-12 DIAGNOSIS — Z923 Personal history of irradiation: Secondary | ICD-10-CM

## 2018-08-12 DIAGNOSIS — Z9221 Personal history of antineoplastic chemotherapy: Secondary | ICD-10-CM

## 2018-08-12 DIAGNOSIS — I10 Essential (primary) hypertension: Secondary | ICD-10-CM | POA: Diagnosis not present

## 2018-08-12 DIAGNOSIS — Z79899 Other long term (current) drug therapy: Secondary | ICD-10-CM

## 2018-08-12 DIAGNOSIS — Z7984 Long term (current) use of oral hypoglycemic drugs: Secondary | ICD-10-CM | POA: Diagnosis not present

## 2018-08-12 DIAGNOSIS — E7800 Pure hypercholesterolemia, unspecified: Secondary | ICD-10-CM

## 2018-08-12 DIAGNOSIS — E119 Type 2 diabetes mellitus without complications: Secondary | ICD-10-CM

## 2018-08-12 DIAGNOSIS — K219 Gastro-esophageal reflux disease without esophagitis: Secondary | ICD-10-CM | POA: Diagnosis not present

## 2018-08-12 DIAGNOSIS — Z85828 Personal history of other malignant neoplasm of skin: Secondary | ICD-10-CM | POA: Diagnosis not present

## 2018-08-12 DIAGNOSIS — Z7951 Long term (current) use of inhaled steroids: Secondary | ICD-10-CM

## 2018-08-12 DIAGNOSIS — R5383 Other fatigue: Secondary | ICD-10-CM | POA: Diagnosis not present

## 2018-08-12 DIAGNOSIS — Z87891 Personal history of nicotine dependence: Secondary | ICD-10-CM

## 2018-08-12 LAB — CBC WITH DIFFERENTIAL (CANCER CENTER ONLY)
Abs Immature Granulocytes: 0.04 10*3/uL (ref 0.00–0.07)
Basophils Absolute: 0 10*3/uL (ref 0.0–0.1)
Basophils Relative: 0 %
Eosinophils Absolute: 0.1 10*3/uL (ref 0.0–0.5)
Eosinophils Relative: 1 %
HCT: 35.7 % — ABNORMAL LOW (ref 39.0–52.0)
Hemoglobin: 11.7 g/dL — ABNORMAL LOW (ref 13.0–17.0)
Immature Granulocytes: 1 %
Lymphocytes Relative: 24 %
Lymphs Abs: 1.1 10*3/uL (ref 0.7–4.0)
MCH: 31.7 pg (ref 26.0–34.0)
MCHC: 32.8 g/dL (ref 30.0–36.0)
MCV: 96.7 fL (ref 80.0–100.0)
Monocytes Absolute: 0.5 10*3/uL (ref 0.1–1.0)
Monocytes Relative: 10 %
Neutro Abs: 3 10*3/uL (ref 1.7–7.7)
Neutrophils Relative %: 64 %
Platelet Count: 193 10*3/uL (ref 150–400)
RBC: 3.69 MIL/uL — ABNORMAL LOW (ref 4.22–5.81)
RDW: 12.9 % (ref 11.5–15.5)
WBC Count: 4.7 10*3/uL (ref 4.0–10.5)
nRBC: 0 % (ref 0.0–0.2)

## 2018-08-12 LAB — CMP (CANCER CENTER ONLY)
ALT: 19 U/L (ref 0–44)
AST: 11 U/L — ABNORMAL LOW (ref 15–41)
Albumin: 3.8 g/dL (ref 3.5–5.0)
Alkaline Phosphatase: 40 U/L (ref 38–126)
Anion gap: 9 (ref 5–15)
BUN: 19 mg/dL (ref 8–23)
CO2: 23 mmol/L (ref 22–32)
Calcium: 9.3 mg/dL (ref 8.9–10.3)
Chloride: 107 mmol/L (ref 98–111)
Creatinine: 1.02 mg/dL (ref 0.61–1.24)
GFR, Est AFR Am: >60 mL/min
GFR, Estimated: >60 mL/min
Glucose, Bld: 138 mg/dL — ABNORMAL HIGH (ref 70–99)
Potassium: 3.9 mmol/L (ref 3.5–5.1)
Sodium: 139 mmol/L (ref 135–145)
Total Bilirubin: 0.3 mg/dL (ref 0.3–1.2)
Total Protein: 6.3 g/dL — ABNORMAL LOW (ref 6.5–8.1)

## 2018-08-12 MED ORDER — TEMOZOLOMIDE 20 MG PO CAPS: 20.0000 mg | ORAL_CAPSULE | Freq: Every day | ORAL | 0 refills | Status: DC

## 2018-08-12 MED ORDER — TEMOZOLOMIDE 100 MG PO CAPS: 100.0000 mg | ORAL_CAPSULE | Freq: Every day | ORAL | 0 refills | Status: DC

## 2018-08-12 MED ORDER — ONDANSETRON HCL 8 MG PO TABS: 8.0000 mg | ORAL_TABLET | Freq: Two times a day (BID) | ORAL | 1 refills | Status: DC | PRN

## 2018-08-12 MED ORDER — TEMOZOLOMIDE 140 MG PO CAPS: 140.0000 mg | ORAL_CAPSULE | Freq: Every day | ORAL | 0 refills | Status: DC

## 2018-08-12 NOTE — Progress Notes (Signed)
[facility] [place], [place] [address] [phone]   Interval Evaluation  Date of Service: 08/12/18 Patient Name: Bryan [name] Patient MRN: [id number] Patient DOB: 1940-07-02 Provider: Ventura Sellers, MD  Identifying Statement:  Bryan [name] is a 78 y.o. [name] with right frontal glioblastoma   Oncologic History:    Glioblastoma, IDH-wildtype ([place])    03/07/2018 Surgery    Craniotomy, resection by Dr. Tommi Rumps ([facility]).  Path is Glioblastoma    03/29/2018 - 04/04/2018 Chemotherapy    The patient had [No matching medication found in this treatment plan]  for chemotherapy treatment.     04/04/2018 - 05/13/2018 Radiation Therapy    IMRT and concurrent Temozolomide with Dr. Tammi Klippel    06/09/2018 -  Chemotherapy    The patient had [No matching medication found in this treatment plan]  for chemotherapy treatment.      Biomarkers:  MGMT Unknown.  IDH 1/2 Wild type.  EGFR Expressed  TERT Unknown   Interval History: Bryan [name] presents today for follow up.  He and his wife describe continued improvement in appetite, now having gained 3 pounds over past 2 weeks.  Energy continues to improve.  GI upset, nausea, have not recurred.  Speaking/fluency is stable. History is overall still limited by language impairments, wife assisted with history.    H+P (02/16/18) Bryan [name] 78 y.o. [name] presented after family noted drooping of the left side of his face yesterday.  In addition, he/they describe some cognitive changes, such as difficulty "finding words" in certain situations.  The patient also feels like his left leg is "lagging" a little behind his right lately.  He is walking on his own, however, and with no falls.  Denies headaches, seizures.   Medications: Current Outpatient Medications on File Prior to Visit  Medication Sig Dispense Refill  . acetaminophen (TYLENOL) 325 MG tablet Take 2 tablets (650 mg total) by mouth  every 6 (six) hours as needed for mild pain.    [name] atorvastatin (LIPITOR) 40 MG tablet Take 1 tablet (40 mg total) by mouth daily. 30 tablet 0  . dexamethasone (DECADRON) 1 MG tablet Take 1 tablet (1 mg total) by mouth 2 (two) times daily. 60 tablet 3  . Flaxseed, Linseed, (FLAXSEED OIL PO) Take by mouth.    . fluticasone (FLONASE) 50 MCG/ACT nasal spray Place 1 spray into both nostrils 2 (two) times daily.    . Ginkgo Biloba Extract (GINKGO BILOBA MEMORY ENHANCER) 60 MG CAPS Take by mouth.    . levETIRAcetam (KEPPRA) 100 MG/ML solution Take 15 mLs (1,500 mg total) by mouth 2 (two) times daily. 473 mL 12  . levothyroxine (SYNTHROID, LEVOTHROID) 75 MCG tablet Take 1 tablet (75 mcg total) by mouth daily before breakfast. 30 tablet 0  . lisinopril (PRINIVIL,ZESTRIL) 5 MG tablet Take 3 tablets (15 mg total) by mouth daily. 30 tablet 1  . metFORMIN (GLUCOPHAGE) 1000 MG tablet Take 1 tablet (1,000 mg total) by mouth 2 (two) times daily with a meal. 60 tablet 1  . Multiple Vitamins-Minerals (MULTI-DAY PLUS MINERALS) TABS Take 1 tablet by mouth daily.    . naphazoline-pheniramine (NAPHCON-A) 0.025-0.3 % ophthalmic solution Place 1 drop into both eyes as needed for eye irritation or allergies.    . Omega-3 1000 MG CAPS Take by mouth.    . ondansetron (ZOFRAN) 4 MG tablet Take 1 tablet (4 mg total) by mouth every 6 (six) hours as needed for nausea or vomiting.  30 tablet 1  . ondansetron (ZOFRAN) 8 MG tablet Take 1 tablet (8 mg total) by mouth 2 (two) times daily as needed (nausea and vomiting). take 30-60 minutes prior to Temodar 30 tablet 1  . pantoprazole (PROTONIX) 40 MG tablet Take 1 tablet (40 mg total) by mouth daily. 30 tablet 3  . temozolomide (TEMODAR) 100 MG capsule Take 1 capsule (100 mg total) by mouth daily. Take for 5 days on, 23 d off, repeat every 28d. May take on an empty stomach to decrease N/V 5 capsule 0  . temozolomide (TEMODAR) 140 MG capsule Take 1 capsule (140 mg total) by mouth daily.  Take for 5 days on, 23 d off, repeat every 28d. May take on an empty stomach to decrease N/V 5 capsule 0  . temozolomide (TEMODAR) 20 MG capsule Take 1 capsule (20 mg total) by mouth daily. Take for 5 days on, 23 days off, repeat every 28d. May take on an empty stomach to decrease N/V 5 capsule 0  . zonisamide (ZONEGRAN) 100 MG capsule Take 1 capsule (100 mg total) by mouth daily. 30 capsule 3   No current facility-administered medications on file prior to visit.     Allergies:  Allergies  Allergen Reactions  . Other     Cats, pollen   Past Medical History:  Past Medical History:  Diagnosis Date  . Arthritis   . Brain cancer ([place])    Glioblastoma  . Diabetes mellitus without complication ([place])   . GERD (gastroesophageal reflux disease)   . Hypercholesterolemia   . Hypertension   . Thyroid disease    Past Surgical History:  Past Surgical History:  Procedure Laterality Date  . CRANIOTOMY    . HERNIA REPAIR    . 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 Marital status: Married    Spouse name: Not on file  . Number of children: Not on file  . Years of education: Not on file  . Highest education level: Not on file  Occupational History  . Not on file  Social Needs  . Financial resource strain: Not on file  . Food insecurity:    Worry: Not on file    Inability: Not on file  . Transportation needs:    Medical: Not on file    Non-medical: Not on file  Tobacco Use  . Smoking status: Former Smoker    Packs/day: 0.25    Years: 3.00    Pack years: 0.75    Types: Cigarettes    Last attempt to quit: 03/23/1961    Years since quitting: 57.4  . Smokeless tobacco: Never Used  Substance and Sexual Activity  . Alcohol use: Yes    Alcohol/week: 1.0 standard drinks    Types: 1 Glasses of wine per week    Comment: weekly  . Drug use: Never  . Sexual activity: Not Currently  Lifestyle  .  Physical activity:    Days per week: Not on file    Minutes per session: Not on file  . Stress: Not on file  Relationships  . Social connections:    Talks on phone: Not on file    Gets together: Not on file    Attends religious service: Not on file    Active member of club or organization: Not on file    Attends meetings of clubs or organizations: Not on file  Relationship status: Not on file  . Intimate partner violence:    Fear of current or ex partner: Not on file    Emotionally abused: Not on file    Physically abused: Not on file    Forced sexual activity: Not on file  Other Topics Concern  . Not on file  Social History Narrative   Married. Resides in [place].   Family History:  Family History  Problem Relation Age of Onset  . Diabetes Mellitus II Mother   . ALS Father     Review of Systems: Constitutional: Denies fevers, chills or abnormal weight loss Eyes: Denies blurriness of vision Ears, nose, mouth, throat, and face: Denies mucositis or sore throat Respiratory: Denies cough, dyspnea or wheezes Cardiovascular: Denies palpitation, chest discomfort or lower extremity swelling Gastrointestinal:  Denies nausea, constipation, diarrhea GU: Denies dysuria or incontinence Skin: Denies abnormal skin rashes Neurological: Per HPI Musculoskeletal: Denies joint pain, back or neck discomfort. No decrease in ROM Behavioral/Psych: Denies anxiety, disturbance in thought content, and mood instability  Physical Exam: Vitals:   08/12/18 1143  BP: 139/67  Pulse: 80  Resp: 18  Temp: 98.1 F (36.7 C)  SpO2: 99%    KPS: 70. General: Alert, cooperative, pleasant, in no acute distress Head: Craniotomy scar noted, dry and intact. EENT: No conjunctival injection or scleral icterus. Oral mucosa moist Lungs: Resp effort normal Cardiac: Regular rate and rhythm Abdomen: Soft, non-distended abdomen Skin: No rashes cyanosis or petechiae. Extremities: no edema  Neurologic  Exam: Mental Status: Awake, alert, attentive to examiner. Oriented to self and environment. Language is fairly densely impaired with regards to fluency, comprehension and repetition.   Cranial Nerves: Visual acuity is grossly normal. Visual fields are full. Extra-ocular movements intact. No ptosis. L UMN facial paresis, tongue midline. Motor: Tone and bulk are normal. Power is 4+/5 in left arm and 4+/5 in leg, with impaired fine motor function in left hand. Reflexes are symmetric, no pathologic reflexes present. Intact finger to nose bilaterally Sensory: Intact to light touch and temperature Gait: Deferred  Labs: I have reviewed the data as listed    Component Value Date/Time   NA 137 07/11/2018 1132   K 3.9 07/11/2018 1132   CL 104 07/11/2018 1132   CO2 24 07/11/2018 1132   GLUCOSE 154 (H) 07/11/2018 1132   BUN 14 07/11/2018 1132   CREATININE 1.20 07/11/2018 1132   CALCIUM 9.0 07/11/2018 1132   PROT 6.4 (L) 07/11/2018 1132   ALBUMIN 4.0 07/11/2018 1132   AST 11 (L) 07/11/2018 1132   ALT 8 07/11/2018 1132   ALKPHOS 51 07/11/2018 1132   BILITOT 0.4 07/11/2018 1132   GFRNONAA 58 (L) 07/11/2018 1132   GFRAA >60 07/11/2018 1132   Lab Results  Component Value Date   WBC 4.7 08/12/2018   NEUTROABS 3.0 08/12/2018   HGB 11.7 (L) 08/12/2018   HCT 35.7 (L) 08/12/2018   MCV 96.7 08/12/2018   PLT 193 08/12/2018    Imaging:  [facility] Clinician Interpretation: I have personally reviewed the CNS images as listed.  My interpretation, in the context of the patient's clinical presentation, is stable disease  Mr Jeri Cos Wo Contrast  Result Date: 08/10/2018 CLINICAL DATA:  78 y/o M; follow-up of glioblastoma post resection, chemo, radiation. EXAM: MRI HEAD WITHOUT AND WITH CONTRAST TECHNIQUE: Multiplanar, multiecho pulse sequences of the brain and surrounding structures were obtained without and with intravenous contrast. CONTRAST:  60m MULTIHANCE GADOBENATE DIMEGLUMINE 529 MG/ML IV SOLN  COMPARISON:  06/03/2018,  03/07/2018, 02/16/2018 MRI of the head. FINDINGS: Brain: Resection cavity within the right insula is slightly decreased in size. There blood products within the resection cavity demonstrating susceptibility blooming and reduced diffusion. The thick irregular rim of enhancement at the cavity margins is stable in comparison with the prior MRI of the brain, the region of enhancement measures 32 x 17 x 28 mm (AP x ML x CC series 14, image 82 and series 15, image 13). Areas of enhancement demonstrate reduced diffusion greatest at the posterosuperior cavity margin (series 7, image 15). T2 FLAIR signal abnormality in the surrounding white matter of the frontal lobe, anterior temporal lobe, and extending into the brainstem via cerebral peduncle is increased in distribution. No findings of stroke, acute hemorrhage, or additional focus of mass effect. No hydrocephalus. Decreased thickness of the extradural fluid collection subjacent to the right lateral craniotomy. Vascular: Normal flow voids. Skull and upper cervical spine: Chronic postsurgical changes related to right lateral craniotomy. Sinuses/Orbits: Opacification of the left maxillary sinus. Additional included paranasal sinuses are unremarkable. Opacification of the right mastoid air cells. No abnormal signal of the left mastoid air cells. Orbits are unremarkable. Other: None. IMPRESSION: 1. Mildly decreased size of the resection cavity within the right insula. Stable thick rim of irregular enhancement, probably largely representing posttreatment changes. At the posterosuperior margin enhancement demonstrates reduced diffusion which may represent residual neoplasm. 2. Increased distribution of T2 FLAIR signal abnormality in right frontal and temporal lobes without mass effect, probably edema and posttreatment changes. 3. No new acute intracranial abnormality identified. Electronically Signed   By: Kristine Garbe M.D.   On: 08/10/2018  00:31    Assessment/Plan 1.  Glioblastoma, IDH-wildtype [facility])   Mr. Turay is clinically and radiographically stable today.  His weight loss has reversed with the dexamethasone.  We recommended continuing treatment with cycle #2 Temozolomide 121m/m2, on for five days and off for twenty three days in twenty eight day cycles. The patient will have a complete blood count performed on days 21 and 28 of each cycle, and a comprehensive metabolic panel performed on day 28 of each cycle. Labs may need to be performed more often. Zofran will prescribed for home use for nausea/vomiting.   Chemotherapy should be held for the following:  ANC less than 1,000  Platelets less than 100,000  LFT or creatinine greater than 2x ULN  If clinical concerns/contraindications develop  We recommended continuing dexamethasone 141mBID.  We asked him to return to clinic in 4 weeks with labs for evaluation.  We appreciate the opportunity to participate in the care of Bryan [name]  The total time spent in the encounter was 25 minutes and more than 50% was on counseling and review of test results   ZaVentura SellersMD Medical Director of Neuro-Oncology [facility]5/22/20 11:30 AM

## 2018-08-16 ENCOUNTER — Telehealth: Payer: Self-pay | Admitting: Internal Medicine

## 2018-08-16 MED FILL — ZONISAMIDE 100 MG CAPSULE: 100 | 30 days supply | Qty: 30 | Fill #3

## 2018-08-16 MED FILL — TEMOZOLOMIDE 100 MG CAPS: 100 | 28 days supply | Qty: 5 | Fill #0

## 2018-08-16 MED FILL — TEMOZOLOMIDE 140 MG CAPS: 140 | 28 days supply | Qty: 5 | Fill #0

## 2018-08-16 MED FILL — TEMOZOLOMIDE 20 MG CAPS: 20 | 28 days supply | Qty: 5 | Fill #0

## 2018-08-16 NOTE — Telephone Encounter (Signed)
Scheduled appt per 5/22 los. °

## 2018-08-25 MED FILL — LEVETIRACETAM 100 MG/ML SOL: 100 | 15 days supply | Qty: 473 | Fill #9

## 2018-08-25 MED FILL — ONDANSETRON HCL 8 MG TABLET: 8 | 15 days supply | Qty: 30 | Fill #1

## 2018-09-09 MED FILL — PANTOPRAZOLE SOD DR 40 MG T: 40 | 30 days supply | Qty: 30 | Fill #3

## 2018-09-12 ENCOUNTER — Inpatient Hospital Stay (HOSPITAL_BASED_OUTPATIENT_CLINIC_OR_DEPARTMENT_OTHER): Payer: Medicare Other | Admitting: Internal Medicine

## 2018-09-12 ENCOUNTER — Other Ambulatory Visit: Payer: Self-pay

## 2018-09-12 ENCOUNTER — Inpatient Hospital Stay: Payer: Medicare Other | Attending: Radiation Oncology

## 2018-09-12 ENCOUNTER — Telehealth: Payer: Self-pay | Admitting: Internal Medicine

## 2018-09-12 VITALS — BP 132/66 | HR 88 | Temp 98.4°F | Resp 18 | Ht 64.5 in | Wt 142.2 lb

## 2018-09-12 DIAGNOSIS — Z7951 Long term (current) use of inhaled steroids: Secondary | ICD-10-CM

## 2018-09-12 DIAGNOSIS — I10 Essential (primary) hypertension: Secondary | ICD-10-CM | POA: Insufficient documentation

## 2018-09-12 DIAGNOSIS — Z7984 Long term (current) use of oral hypoglycemic drugs: Secondary | ICD-10-CM

## 2018-09-12 DIAGNOSIS — E079 Disorder of thyroid, unspecified: Secondary | ICD-10-CM | POA: Insufficient documentation

## 2018-09-12 DIAGNOSIS — E7800 Pure hypercholesterolemia, unspecified: Secondary | ICD-10-CM | POA: Diagnosis not present

## 2018-09-12 DIAGNOSIS — Z9221 Personal history of antineoplastic chemotherapy: Secondary | ICD-10-CM | POA: Diagnosis not present

## 2018-09-12 DIAGNOSIS — Z923 Personal history of irradiation: Secondary | ICD-10-CM | POA: Diagnosis not present

## 2018-09-12 DIAGNOSIS — E119 Type 2 diabetes mellitus without complications: Secondary | ICD-10-CM | POA: Diagnosis not present

## 2018-09-12 DIAGNOSIS — C711 Malignant neoplasm of frontal lobe: Secondary | ICD-10-CM

## 2018-09-12 DIAGNOSIS — Z87891 Personal history of nicotine dependence: Secondary | ICD-10-CM | POA: Diagnosis not present

## 2018-09-12 DIAGNOSIS — Z79899 Other long term (current) drug therapy: Secondary | ICD-10-CM | POA: Diagnosis not present

## 2018-09-12 DIAGNOSIS — Z85828 Personal history of other malignant neoplasm of skin: Secondary | ICD-10-CM | POA: Diagnosis not present

## 2018-09-12 LAB — CMP (CANCER CENTER ONLY)
ALT: 23 U/L (ref 0–44)
AST: 12 U/L — ABNORMAL LOW (ref 15–41)
Albumin: 3.9 g/dL (ref 3.5–5.0)
Alkaline Phosphatase: 40 U/L (ref 38–126)
Anion gap: 10 (ref 5–15)
BUN: 26 mg/dL — ABNORMAL HIGH (ref 8–23)
CO2: 22 mmol/L (ref 22–32)
Calcium: 9.3 mg/dL (ref 8.9–10.3)
Chloride: 104 mmol/L (ref 98–111)
Creatinine: 1.2 mg/dL (ref 0.61–1.24)
GFR, Est AFR Am: >60 mL/min (ref >60)
GFR, Estimated: 58 mL/min — ABNORMAL LOW (ref >60)
Glucose, Bld: 170 mg/dL — ABNORMAL HIGH (ref 70–99)
Potassium: 4.1 mmol/L (ref 3.5–5.1)
Sodium: 136 mmol/L (ref 135–145)
Total Bilirubin: 0.5 mg/dL (ref 0.3–1.2)
Total Protein: 6.5 g/dL (ref 6.5–8.1)

## 2018-09-12 LAB — CBC WITH DIFFERENTIAL (CANCER CENTER ONLY)
Abs Immature Granulocytes: 0.05 10*3/uL (ref 0.00–0.07)
Basophils Absolute: 0 10*3/uL (ref 0.0–0.1)
Basophils Relative: 0 %
Eosinophils Absolute: 0.1 10*3/uL (ref 0.0–0.5)
Eosinophils Relative: 1 %
HCT: 33.8 % — ABNORMAL LOW (ref 39.0–52.0)
Hemoglobin: 11.4 g/dL — ABNORMAL LOW (ref 13.0–17.0)
Immature Granulocytes: 1 %
Lymphocytes Relative: 16 %
Lymphs Abs: 0.8 10*3/uL (ref 0.7–4.0)
MCH: 33.2 pg (ref 26.0–34.0)
MCHC: 33.7 g/dL (ref 30.0–36.0)
MCV: 98.5 fL (ref 80.0–100.0)
Monocytes Absolute: 0.5 10*3/uL (ref 0.1–1.0)
Monocytes Relative: 9 %
Neutro Abs: 3.6 10*3/uL (ref 1.7–7.7)
Neutrophils Relative %: 73 %
Platelet Count: 143 10*3/uL — ABNORMAL LOW (ref 150–400)
RBC: 3.43 MIL/uL — ABNORMAL LOW (ref 4.22–5.81)
RDW: 13.8 % (ref 11.5–15.5)
WBC Count: 5 10*3/uL (ref 4.0–10.5)
nRBC: 0 % (ref 0.0–0.2)

## 2018-09-12 MED ORDER — ONDANSETRON HCL 8 MG PO TABS: 8.0000 mg | ORAL_TABLET | Freq: Two times a day (BID) | ORAL | 1 refills | Status: DC | PRN

## 2018-09-12 MED ORDER — PANTOPRAZOLE SODIUM 40 MG PO TBEC: 40.0000 mg | DELAYED_RELEASE_TABLET | Freq: Every day | ORAL | 3 refills | Status: DC

## 2018-09-12 MED ORDER — LEVETIRACETAM 100 MG/ML PO SOLN: 1500.0000 mg | Freq: Two times a day (BID) | ORAL | 12 refills | Status: DC

## 2018-09-12 MED ORDER — ZONISAMIDE 100 MG PO CAPS: 100.0000 mg | ORAL_CAPSULE | Freq: Every day | ORAL | 3 refills | Status: DC

## 2018-09-12 NOTE — Progress Notes (Signed)
[facility] [place], [place] [address] [phone]   Interval Evaluation  Date of Service: 09/12/18 Patient Name: Maxamillian Tienda Patient MRN: [id number] Patient DOB: [birth year] Provider: Ventura Sellers, MD  Identifying Statement:  Braeden Kennan is a 78 y.o. male with right frontal glioblastoma   Oncologic History: Oncology History   Glioblastoma, IDH-wildtype ([place])   03/07/2018 Surgery   Craniotomy, resection by Dr. Tommi Rumps ([facility]).  Path is Glioblastoma   03/29/2018 - 04/04/2018 Chemotherapy   The patient had [No matching medication found in this treatment plan]  for chemotherapy treatment.    04/04/2018 - 05/13/2018 Radiation Therapy   IMRT and concurrent Temozolomide with Dr. Tammi Klippel   06/09/2018 -  Chemotherapy   The patient had [No matching medication found in this treatment plan]  for chemotherapy treatment.      Biomarkers:  MGMT Unknown.  IDH 1/2 Wild type.  EGFR Expressed  TERT Unknown   Interval History: Rice Walsh presents today for follow up after completing cycle #2 TMZ.  He describes little change this month, maybe slight increase in imbalance.  Energy otherwise continues to improve.  GI upset, nausea, have not recurred.  Speaking/fluency is stable. History is overall still limited by language impairments, wife assisted with history.    H+P (02/16/18) [name] 78 y.o. male presented after family noted drooping of the left side of his face yesterday.  In addition, he/they describe some cognitive changes, such as difficulty "finding words" in certain situations.  The patient also feels like his left leg is "lagging" a little behind his right lately.  He is walking on his own, however, and with no falls.  Denies headaches, seizures.   Medications: Current Outpatient Medications on File Prior to Visit  Medication Sig Dispense Refill  . acetaminophen (TYLENOL) 325 MG tablet Take 2 tablets (650 mg  total) by mouth every 6 (six) hours as needed for mild pain.    [name] atorvastatin (LIPITOR) 40 MG tablet Take 1 tablet (40 mg total) by mouth daily. 30 tablet 0  . dexamethasone (DECADRON) 1 MG tablet Take 1 tablet (1 mg total) by mouth 2 (two) times daily. 60 tablet 3  . Flaxseed, Linseed, (FLAXSEED OIL PO) Take by mouth.    . fluticasone (FLONASE) 50 MCG/ACT nasal spray Place 1 spray into both nostrils 2 (two) times daily.    . Ginkgo Biloba Extract (GINKGO BILOBA MEMORY ENHANCER) 60 MG CAPS Take by mouth.    . levETIRAcetam (KEPPRA) 100 MG/ML solution Take 15 mLs (1,500 mg total) by mouth 2 (two) times daily. 473 mL 12  . levothyroxine (SYNTHROID, LEVOTHROID) 75 MCG tablet Take 1 tablet (75 mcg total) by mouth daily before breakfast. 30 tablet 0  . lisinopril (PRINIVIL,ZESTRIL) 5 MG tablet Take 3 tablets (15 mg total) by mouth daily. 30 tablet 1  . metFORMIN (GLUCOPHAGE) 1000 MG tablet Take 1 tablet (1,000 mg total) by mouth 2 (two) times daily with a meal. 60 tablet 1  . Multiple Vitamins-Minerals (MULTI-DAY PLUS MINERALS) TABS Take 1 tablet by mouth daily.    . naphazoline-pheniramine (NAPHCON-A) 0.025-0.3 % ophthalmic solution Place 1 drop into both eyes as needed for eye irritation or allergies.    . Omega-3 1000 MG CAPS Take by mouth.    . ondansetron (ZOFRAN) 4 MG tablet Take 1 tablet (4 mg total) by mouth every 6 (six) hours as needed for nausea or vomiting. 30 tablet 1  . ondansetron (ZOFRAN) 8  MG tablet Take 1 tablet (8 mg total) by mouth 2 (two) times daily as needed (nausea and vomiting). take 30-60 minutes prior to Temodar 30 tablet 1  . ondansetron (ZOFRAN) 8 MG tablet Take 1 tablet (8 mg total) by mouth 2 (two) times daily as needed (nausea and vomiting). take 30-60 minutes prior to Temodar 30 tablet 1  . pantoprazole (PROTONIX) 40 MG tablet Take 1 tablet (40 mg total) by mouth daily. 30 tablet 3  . temozolomide (TEMODAR) 100 MG capsule Take 1 capsule (100 mg total) by mouth daily.  Take for 5 days on, 23 d off, repeat every 28d. May take on an empty stomach to decrease N/V 5 capsule 0  . temozolomide (TEMODAR) 100 MG capsule Take 1 capsule (100 mg total) by mouth daily. Take for 5 days on, 23 d off, repeat every 28d. May take on an empty stomach to decrease N/V 5 capsule 0  . temozolomide (TEMODAR) 140 MG capsule Take 1 capsule (140 mg total) by mouth daily. Take for 5 days on, 23 d off, repeat every 28d. May take on an empty stomach to decrease N/V 5 capsule 0  . temozolomide (TEMODAR) 140 MG capsule Take 1 capsule (140 mg total) by mouth daily. Take for 5 days on, 23 d off, repeat every 28d. May take on an empty stomach to decrease N/V 5 capsule 0  . temozolomide (TEMODAR) 20 MG capsule Take 1 capsule (20 mg total) by mouth daily. Take for 5 days on, 23 days off, repeat every 28d. May take on an empty stomach to decrease N/V 5 capsule 0  . temozolomide (TEMODAR) 20 MG capsule Take 1 capsule (20 mg total) by mouth daily. Take for 5 days on, 23 days off, repeat every 28d. May take on an empty stomach to decrease N/V 5 capsule 0  . zonisamide (ZONEGRAN) 100 MG capsule Take 1 capsule (100 mg total) by mouth daily. 30 capsule 3   No current facility-administered medications on file prior to visit.     Allergies:  Allergies  Allergen Reactions  . Other     Cats, pollen   Past Medical History:  Past Medical History:  Diagnosis Date  . Arthritis   . Brain cancer ([place])    Glioblastoma  . Diabetes mellitus without complication ([place])   . GERD (gastroesophageal reflux disease)   . Hypercholesterolemia   . Hypertension   . Thyroid disease    Past Surgical History:  Past Surgical History:  Procedure Laterality Date  . CRANIOTOMY    . HERNIA REPAIR    . 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 Marital status: Married    Spouse name: Not on file  . Number of children: Not on file   . Years of education: Not on file  . Highest education level: Not on file  Occupational History  . Not on file  Social Needs  . Financial resource strain: Not on file  . Food insecurity    Worry: Not on file    Inability: Not on file  . Transportation needs    Medical: Not on file    Non-medical: Not on file  Tobacco Use  . Smoking status: Former Smoker    Packs/day: 0.25    Years: 3.00    Pack years: 0.75    Types: Cigarettes    Quit date: 03/23/1961    Years  since quitting: 57.5  . Smokeless tobacco: Never Used  Substance and Sexual Activity  . Alcohol use: Yes    Alcohol/week: 1.0 standard drinks    Types: 1 Glasses of wine per week    Comment: weekly  . Drug use: Never  . Sexual activity: Not Currently  Lifestyle  . Physical activity    Days per week: Not on file    Minutes per session: Not on file  . Stress: Not on file  Relationships  . Social Herbalist on phone: Not on file    Gets together: Not on file    Attends religious service: Not on file    Active member of club or organization: Not on file    Attends meetings of clubs or organizations: Not on file    Relationship status: Not on file  . Intimate partner violence    Fear of current or ex partner: Not on file    Emotionally abused: Not on file    Physically abused: Not on file    Forced sexual activity: Not on file  Other Topics Concern  . Not on file  Social History Narrative   Married. Resides in [place].   Family History:  Family History  Problem Relation Age of Onset  . Diabetes Mellitus II Mother   . ALS Father     Review of Systems: Constitutional: Denies fevers, chills or abnormal weight loss Eyes: Denies blurriness of vision Ears, nose, mouth, throat, and face: Denies mucositis or sore throat Respiratory: Denies cough, dyspnea or wheezes Cardiovascular: Denies palpitation, chest discomfort or lower extremity swelling Gastrointestinal:  Denies nausea, constipation,  diarrhea GU: Denies dysuria or incontinence Skin: Denies abnormal skin rashes Neurological: Per HPI Musculoskeletal: Denies joint pain, back or neck discomfort. No decrease in ROM Behavioral/Psych: Denies anxiety, disturbance in thought content, and mood instability  Physical Exam: Vitals:   09/12/18 1042  BP: 132/66  Pulse: 88  Resp: 18  Temp: 98.4 F (36.9 C)  SpO2: 100%    KPS: 70. General: Alert, cooperative, pleasant, in no acute distress Head: Craniotomy scar noted, dry and intact. EENT: No conjunctival injection or scleral icterus. Oral mucosa moist Lungs: Resp effort normal Cardiac: Regular rate and rhythm Abdomen: Soft, non-distended abdomen Skin: No rashes cyanosis or petechiae. Extremities: no edema  Neurologic Exam: Mental Status: Awake, alert, attentive to examiner. Oriented to self and environment. Language is fairly densely impaired with regards to fluency, comprehension and repetition.   Cranial Nerves: Visual acuity is grossly normal. Visual fields are full. Extra-ocular movements intact. No ptosis. L UMN facial paresis, tongue midline. Motor: Tone and bulk are normal. Power is 4+/5 in left arm and 4+/5 in leg, with impaired fine motor function in left hand. Reflexes are symmetric, no pathologic reflexes present. Intact finger to nose bilaterally Sensory: Intact to light touch and temperature Gait: Cane assisted  Labs: I have reviewed the data as listed    Component Value Date/Time   NA 139 08/12/2018 1112   K 3.9 08/12/2018 1112   CL 107 08/12/2018 1112   CO2 23 08/12/2018 1112   GLUCOSE 138 (H) 08/12/2018 1112   BUN 19 08/12/2018 1112   CREATININE 1.02 08/12/2018 1112   CALCIUM 9.3 08/12/2018 1112   PROT 6.3 (L) 08/12/2018 1112   ALBUMIN 3.8 08/12/2018 1112   AST 11 (L) 08/12/2018 1112   ALT 19 08/12/2018 1112   ALKPHOS 40 08/12/2018 1112   BILITOT 0.3 08/12/2018 1112   GFRNONAA >60  08/12/2018 1112   GFRAA >60 08/12/2018 1112   Lab Results   Component Value Date   WBC 5.0 09/12/2018   NEUTROABS 3.6 09/12/2018   HGB 11.4 (L) 09/12/2018   HCT 33.8 (L) 09/12/2018   MCV 98.5 09/12/2018   PLT 143 (L) 09/12/2018     Assessment/Plan 1.  Glioblastoma, IDH-wildtype [facility])   Mr. Sahli is clinically stable today.  Labs are WNL.  We recommended continuing treatment with cycle #3 Temozolomide 160m/m2, on for five days and off for twenty three days in twenty eight day cycles. The patient will have a complete blood count performed on days 21 and 28 of each cycle, and a comprehensive metabolic panel performed on day 28 of each cycle. Labs may need to be performed more often. Zofran will prescribed for home use for nausea/vomiting.   Chemotherapy should be held for the following:  ANC less than 1,000  Platelets less than 100,000  LFT or creatinine greater than 2x ULN  If clinical concerns/contraindications develop  We recommended continuing dexamethasone 155mBID.  We asked him to return to clinic in 4 weeks with an MRI brain for evaluation.  We appreciate the opportunity to participate in the care of EdBerton Bon  The total time spent in the encounter was 25 minutes and more than 50% was on counseling and review of test results   ZaVentura SellersMD Medical Director of Neuro-Oncology [facility]6/22/20 10:39 AM

## 2018-09-12 NOTE — Telephone Encounter (Signed)
Scheduled appt per 6/22 los. Spoke with patient wife and she is aware of appt date and time.

## 2018-09-13 ENCOUNTER — Other Ambulatory Visit: Payer: Self-pay | Admitting: Internal Medicine

## 2018-09-13 ENCOUNTER — Other Ambulatory Visit: Payer: Self-pay | Admitting: *Deleted

## 2018-09-13 DIAGNOSIS — C711 Malignant neoplasm of frontal lobe: Secondary | ICD-10-CM

## 2018-09-13 MED ORDER — LEVETIRACETAM 100 MG/ML PO SOLN: 1500.0000 mg | Freq: Two times a day (BID) | ORAL | 12 refills | Status: DC

## 2018-09-14 ENCOUNTER — Other Ambulatory Visit: Payer: Self-pay | Admitting: Internal Medicine

## 2018-09-14 DIAGNOSIS — C711 Malignant neoplasm of frontal lobe: Secondary | ICD-10-CM

## 2018-09-14 MED ORDER — TEMOZOLOMIDE 20 MG PO CAPS: 20.0000 mg | ORAL_CAPSULE | Freq: Every day | ORAL | 0 refills | Status: DC

## 2018-09-14 MED ORDER — TEMOZOLOMIDE 100 MG PO CAPS: 100.0000 mg | ORAL_CAPSULE | Freq: Every day | ORAL | 0 refills | Status: DC

## 2018-09-14 MED ORDER — TEMOZOLOMIDE 140 MG PO CAPS: 140.0000 mg | ORAL_CAPSULE | Freq: Every day | ORAL | 0 refills | Status: DC

## 2018-09-15 MED FILL — TEMOZOLOMIDE 140 MG CAPS: 140 | 28 days supply | Qty: 5 | Fill #0

## 2018-09-15 MED FILL — TEMOZOLOMIDE 20 MG CAPS: 20 | 28 days supply | Qty: 5 | Fill #0

## 2018-09-15 MED FILL — TEMOZOLOMIDE 100 MG CAPS: 100 | 28 days supply | Qty: 5 | Fill #0

## 2018-09-15 MED FILL — LEVETIRACETAM 100 MG/ML SOL: 100 | 15 days supply | Qty: 473 | Fill #10

## 2018-10-05 ENCOUNTER — Other Ambulatory Visit: Payer: Self-pay | Admitting: Radiation Therapy

## 2018-10-05 ENCOUNTER — Emergency Department (HOSPITAL_COMMUNITY)
Admission: EM | Admit: 2018-10-05 | Discharge: 2018-10-05 | Disposition: A | Discharge status: Home / Self Care | Payer: Medicare Other | Attending: Emergency Medicine | Admitting: Emergency Medicine

## 2018-10-05 ENCOUNTER — Emergency Department (HOSPITAL_COMMUNITY): Payer: Medicare Other

## 2018-10-05 ENCOUNTER — Encounter (HOSPITAL_COMMUNITY): Payer: Self-pay | Admitting: Emergency Medicine

## 2018-10-05 DIAGNOSIS — R58 Hemorrhage, not elsewhere classified: Secondary | ICD-10-CM | POA: Diagnosis not present

## 2018-10-05 DIAGNOSIS — Y999 Unspecified external cause status: Secondary | ICD-10-CM | POA: Insufficient documentation

## 2018-10-05 DIAGNOSIS — M21372 Foot drop, left foot: Secondary | ICD-10-CM | POA: Insufficient documentation

## 2018-10-05 DIAGNOSIS — E119 Type 2 diabetes mellitus without complications: Secondary | ICD-10-CM | POA: Diagnosis not present

## 2018-10-05 DIAGNOSIS — S199XXA Unspecified injury of neck, initial encounter: Secondary | ICD-10-CM | POA: Diagnosis not present

## 2018-10-05 DIAGNOSIS — R410 Disorientation, unspecified: Secondary | ICD-10-CM | POA: Diagnosis not present

## 2018-10-05 DIAGNOSIS — R531 Weakness: Secondary | ICD-10-CM | POA: Diagnosis not present

## 2018-10-05 DIAGNOSIS — Y9389 Activity, other specified: Secondary | ICD-10-CM | POA: Insufficient documentation

## 2018-10-05 DIAGNOSIS — Z79899 Other long term (current) drug therapy: Secondary | ICD-10-CM | POA: Diagnosis not present

## 2018-10-05 DIAGNOSIS — S0990XA Unspecified injury of head, initial encounter: Secondary | ICD-10-CM

## 2018-10-05 DIAGNOSIS — R404 Transient alteration of awareness: Secondary | ICD-10-CM | POA: Diagnosis not present

## 2018-10-05 DIAGNOSIS — I10 Essential (primary) hypertension: Secondary | ICD-10-CM | POA: Diagnosis not present

## 2018-10-05 DIAGNOSIS — Z7984 Long term (current) use of oral hypoglycemic drugs: Secondary | ICD-10-CM | POA: Insufficient documentation

## 2018-10-05 DIAGNOSIS — E039 Hypothyroidism, unspecified: Secondary | ICD-10-CM | POA: Insufficient documentation

## 2018-10-05 DIAGNOSIS — S0001XA Abrasion of scalp, initial encounter: Secondary | ICD-10-CM | POA: Insufficient documentation

## 2018-10-05 DIAGNOSIS — Z85841 Personal history of malignant neoplasm of brain: Secondary | ICD-10-CM | POA: Insufficient documentation

## 2018-10-05 DIAGNOSIS — Y92017 Garden or yard in single-family (private) house as the place of occurrence of the external cause: Secondary | ICD-10-CM | POA: Diagnosis not present

## 2018-10-05 DIAGNOSIS — R000 Tachycardia, unspecified: Secondary | ICD-10-CM | POA: Diagnosis not present

## 2018-10-05 DIAGNOSIS — R0902 Hypoxemia: Secondary | ICD-10-CM | POA: Diagnosis not present

## 2018-10-05 DIAGNOSIS — W19XXXA Unspecified fall, initial encounter: Secondary | ICD-10-CM | POA: Insufficient documentation

## 2018-10-05 DIAGNOSIS — S0181XA Laceration without foreign body of other part of head, initial encounter: Secondary | ICD-10-CM | POA: Diagnosis not present

## 2018-10-05 DIAGNOSIS — Z87891 Personal history of nicotine dependence: Secondary | ICD-10-CM | POA: Insufficient documentation

## 2018-10-05 DIAGNOSIS — R4182 Altered mental status, unspecified: Secondary | ICD-10-CM | POA: Diagnosis not present

## 2018-10-05 LAB — COMPREHENSIVE METABOLIC PANEL
ALT: 21 U/L (ref 0–44)
AST: 17 U/L (ref 15–41)
Albumin: 3.3 g/dL — ABNORMAL LOW (ref 3.5–5.0)
Alkaline Phosphatase: 27 U/L — ABNORMAL LOW (ref 38–126)
Anion gap: 10 (ref 5–15)
BUN: 21 mg/dL (ref 8–23)
CO2: 19 mmol/L — ABNORMAL LOW (ref 22–32)
Calcium: 8.7 mg/dL — ABNORMAL LOW (ref 8.9–10.3)
Chloride: 110 mmol/L (ref 98–111)
Creatinine, Ser: 1.05 mg/dL (ref 0.61–1.24)
GFR calc Af Amer: >60 mL/min (ref >60)
GFR calc non Af Amer: >60 mL/min (ref >60)
Glucose, Bld: 149 mg/dL — ABNORMAL HIGH (ref 70–99)
Potassium: 3.7 mmol/L (ref 3.5–5.1)
Sodium: 139 mmol/L (ref 135–145)
Total Bilirubin: 0.4 mg/dL (ref 0.3–1.2)
Total Protein: 5.2 g/dL — ABNORMAL LOW (ref 6.5–8.1)

## 2018-10-05 LAB — CBC WITH DIFFERENTIAL/PLATELET
Abs Immature Granulocytes: 0.05 10*3/uL (ref 0.00–0.07)
Basophils Absolute: 0 10*3/uL (ref 0.0–0.1)
Basophils Relative: 0 %
Eosinophils Absolute: 0 10*3/uL (ref 0.0–0.5)
Eosinophils Relative: 0 %
HCT: 27.4 % — ABNORMAL LOW (ref 39.0–52.0)
Hemoglobin: 9.4 g/dL — ABNORMAL LOW (ref 13.0–17.0)
Immature Granulocytes: 1 %
Lymphocytes Relative: 15 %
Lymphs Abs: 0.5 10*3/uL — ABNORMAL LOW (ref 0.7–4.0)
MCH: 34.4 pg — ABNORMAL HIGH (ref 26.0–34.0)
MCHC: 34.3 g/dL (ref 30.0–36.0)
MCV: 100.4 fL — ABNORMAL HIGH (ref 80.0–100.0)
Monocytes Absolute: 0.5 10*3/uL (ref 0.1–1.0)
Monocytes Relative: 14 %
Neutro Abs: 2.4 10*3/uL (ref 1.7–7.7)
Neutrophils Relative %: 70 %
Platelets: 156 10*3/uL (ref 150–400)
RBC: 2.73 MIL/uL — ABNORMAL LOW (ref 4.22–5.81)
RDW: 14.4 % (ref 11.5–15.5)
WBC: 3.5 10*3/uL — ABNORMAL LOW (ref 4.0–10.5)
nRBC: 0 % (ref 0.0–0.2)

## 2018-10-05 MED ORDER — SODIUM CHLORIDE 0.9 % IV BOLUS
500.0000 mL | Freq: Once | INTRAVENOUS | Status: AC
  Administered 2018-10-05: 500 mL via INTRAVENOUS

## 2018-10-05 MED ORDER — ONDANSETRON 4 MG PO TBDP
4.0000 mg | ORAL_TABLET | Freq: Once | ORAL | Status: AC
  Administered 2018-10-05: 4 mg via ORAL
  Filled 2018-10-05: qty 1

## 2018-10-05 MED ORDER — ACETAMINOPHEN 500 MG PO TABS
1000.0000 mg | ORAL_TABLET | Freq: Once | ORAL | Status: AC
  Administered 2018-10-05: 1000 mg via ORAL
  Filled 2018-10-05: qty 2

## 2018-10-05 NOTE — Discharge Instructions (Addendum)
Bryan [name] did not have any damage to his brain or his cervical spine due to his fall.  His head injury did not need any repair today.  If he develops more confusion or other neurologic changes, please bring him to his doctor.  We hope that he feels better soon.

## 2018-10-05 NOTE — ED Notes (Signed)
Juliann Pulse (Wife) [phone] Angelena Sole) [phone] For update

## 2018-10-05 NOTE — ED Provider Notes (Signed)
[facility] EMERGENCY DEPARTMENT Provider Note   CSN: [id number] Arrival date & time: 10/05/18  1704    History   Chief Complaint Chief Complaint  Patient presents with   Fall   Head Injury    HPI Bryan [name] is a 78 y.o. male with a PMH of glioblastoma, seizures, hypertension, hypothyroidism who presents with a scalp abrasion after being found lying on the ground outside of his house by a bystander.  The patient cannot recall for sure whether he lost consciousness but does not think that he did.  He is unsure how he fell and how long he was on the ground.  He says he has been feeling well otherwise and denies any pain currently.  Spoke with his wife on the phone, who says that she was inside their house while he went to check the mail and do some other errands outside.  She thinks that he was down for about 15 minutes.  She says that at baseline, [name] can sometimes be confused about time but otherwise understands everything.  He does require help from her when showering and dressing.  He has had residual left-sided weakness, left-sided facial droop, and dysarthria due to his glioblastoma.  He also has a left foot drop that makes walking sometimes difficult.  He had been feeling well this morning before his fall.     Past Medical History:  Diagnosis Date   Arthritis    Brain cancer ([place])    Glioblastoma   Diabetes mellitus without complication ([place])    GERD (gastroesophageal reflux disease)    Hypercholesterolemia    Hypertension    Thyroid disease     Patient Active Problem List   Diagnosis Date Noted   Expressive aphasia    Glioblastoma multiforme of brain ([place])    Brain neoplasm ([place]) 03/28/2018    Glioblastoma, IDH-wildtype ([place])  03/28/2018   Acute blood loss anemia    Seizures ([facility])    Hypoalbuminemia due to protein-calorie malnutrition ([facility])    Hyponatremia    Steroid-induced hyperglycemia    Brain tumor ([place])  03/11/2018   Postoperative pain    Seizure prophylaxis    Prediabetes    Dyslipidemia    S/P craniotomy 03/08/2018   Brain mass 02/16/2018   Hypertension    Type 2 diabetes mellitus without complication, without long-term current use of insulin ([place]) 01/16/2015   Hyperlipidemia 04/11/2014   Inguinal hernia 08/10/2013    Past Surgical History:  Procedure Laterality Date   CRANIOTOMY     HERNIA REPAIR     squamous cell skin 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 Authorizing Provider  acetaminophen (TYLENOL) 325 MG tablet Take 2 tablets (650 mg total) by mouth every 6 (six) hours as needed for mild pain. 04/05/18   Angiulli, Lavon Paganini, PA-C  atorvastatin (LIPITOR) 40 MG tablet Take 1 tablet (40 mg total) by mouth daily. 04/05/18   Angiulli, Lavon Paganini, PA-C  dexamethasone (DECADRON) 1 MG tablet Take 1 tablet (1 mg total) by mouth 2 (two) times daily. 07/25/18   Vaslow, Acey Lav, MD  Flaxseed, Linseed, (FLAXSEED OIL PO) Take by mouth.    [provider]  fluticasone (FLONASE) 50 MCG/ACT nasal spray Place 1 spray into both nostrils 2 (two) times daily.    [provider]  Ginkgo  Extract (GINKGO BILOBA MEMORY ENHANCER) 60 MG CAPS Take by mouth.    [provider]  °levETIRAcetam (KEPPRA) 100 MG/ML solution Take 15 mLs (1,500 mg total) by mouth 2 (two) times daily. 09/13/18   Vaslow, Zachary K, MD  °levothyroxine (SYNTHROID, LEVOTHROID) 75 MCG tablet Take 1 tablet (75 mcg total) by mouth daily before breakfast. 04/05/18   Angiulli, Daniel J, PA-C  °lisinopril (PRINIVIL,ZESTRIL) 5 MG tablet Take 3 tablets (15 mg total) by mouth daily. 04/05/18   Angiulli, Daniel J, PA-C  °metFORMIN (GLUCOPHAGE) 1000 MG tablet Take 1 tablet (1,000 mg total) by mouth 2 (two) times daily with a meal. 04/05/18   Angiulli, Daniel J, PA-C  °Multiple Vitamins-Minerals  (MULTI-DAY PLUS MINERALS) TABS Take 1 tablet by mouth daily.    [provider]  °naphazoline-pheniramine (NAPHCON-A) 0.025-0.3 % ophthalmic solution Place 1 drop into both eyes as needed for eye irritation or allergies.    [provider]  °Omega-3 1000 MG CAPS Take by mouth.    [provider]  °ondansetron (ZOFRAN) 4 MG tablet Take 1 tablet (4 mg total) by mouth every 6 (six) hours as needed for nausea or vomiting. 04/27/18   Vaslow, Zachary K, MD  °ondansetron (ZOFRAN) 8 MG tablet Take 1 tablet (8 mg total) by mouth 2 (two) times daily as needed (nausea and vomiting). take 30-60 minutes prior to Temodar 06/09/18   Vaslow, Zachary K, MD  °ondansetron (ZOFRAN) 8 MG tablet Take 1 tablet (8 mg total) by mouth 2 (two) times daily as needed (nausea and vomiting). take 30-60 minutes prior to Temodar 09/12/18   Vaslow, Zachary K, MD  °pantoprazole (PROTONIX) 40 MG tablet Take 1 tablet (40 mg total) by mouth daily. 09/12/18   Vaslow, Zachary K, MD  °temozolomide (TEMODAR) 100 MG capsule Take 1 capsule (100 mg total) by mouth daily. Take for 5 days on, 23 d off, repeat every 28d. May take on an empty stomach to decrease N/V 06/09/18   Vaslow, Zachary K, MD  °temozolomide (TEMODAR) 100 MG capsule Take 1 capsule (100 mg total) by mouth daily. Take for 5 days on, 23 d off, repeat every 28d. May take on an empty stomach to decrease N/V 08/12/18   Vaslow, Zachary K, MD  °temozolomide (TEMODAR) 100 MG capsule Take 1 capsule (100 mg total) by mouth daily. Take for 5 days on, 23 d off, repeat every 28d. May take on an empty stomach to decrease N/V 09/14/18   Vaslow, Zachary K, MD  °temozolomide (TEMODAR) 140 MG capsule Take 1 capsule (140 mg total) by mouth daily. Take for 5 days on, 23 d off, repeat every 28d. May take on an empty stomach to decrease N/V 06/09/18   Vaslow, Zachary K, MD  °temozolomide (TEMODAR) 140 MG capsule Take 1 capsule (140 mg total) by mouth daily. Take for 5 days on, 23 d off, repeat  every 28d. May take on an empty stomach to decrease N/V 08/12/18   Vaslow, Zachary K, MD  °temozolomide (TEMODAR) 140 MG capsule Take 1 capsule (140 mg total) by mouth daily. Take for 5 days on, 23 d off, repeat every 28d. May take on an empty stomach to decrease N/V 09/14/18   Vaslow, Zachary K, MD  °temozolomide (TEMODAR) 20 MG capsule Take 1 capsule (20 mg total) by mouth daily. Take for 5 days on, 23 days off, repeat every 28d. May take on an empty stomach to decrease N/V 06/09/18   Vaslow, Zachary K, MD  °temozolomide (TEMODAR)   TEMODAR) 20 MG capsule Take 1 capsule (20 mg total) by mouth daily. Take for 5 days on, 23 days off, repeat every 28d. May take on an empty stomach to decrease N/V 08/12/18   Ventura Sellers, MD  temozolomide (TEMODAR) 20 MG capsule Take 1 capsule (20 mg total) by mouth daily. Take for 5 days on, 23 days off, repeat every 28d. May take on an empty stomach to decrease N/V 09/14/18   Ventura Sellers, MD  zonisamide (ZONEGRAN) 100 MG capsule Take 1 capsule (100 mg total) by mouth daily. 09/12/18   Ventura Sellers, MD    Family History Family History  Problem Relation Age of Onset   Diabetes Mellitus II Mother    ALS Father     Social History Social History   Tobacco Use   Smoking status: Former Smoker    Packs/day: 0.25    Years: 3.00    Pack years: 0.75    Types: Cigarettes    Quit date: 03/23/1961    Years since quitting: 57.5   Smokeless tobacco: Never Used  Substance Use Topics   Alcohol use: Yes    Alcohol/week: 1.0 standard drinks    Types: 1 Glasses of wine per week    Comment: weekly   Drug use: Never     Allergies   Other   Review of Systems Review of Systems  Constitutional: Negative for activity change, appetite change, chills, fatigue and fever.  HENT: Negative for congestion.   Cardiovascular: Negative for chest pain.  Gastrointestinal: Negative for abdominal pain.  Genitourinary: Negative for dysuria.  Musculoskeletal: Negative for back  pain and neck pain.  Neurological: Positive for facial asymmetry (chronic L sided facial droop) and weakness (chronic L sided ). Negative for seizures (none recent), light-headedness and headaches.  Hematological: Does not bruise/bleed easily.  Psychiatric/Behavioral: Positive for confusion. Negative for behavioral problems.     Physical Exam Updated Vital Signs BP (!) 143/83    Pulse 88    Temp 97.8 F (36.6 C) (Oral)    Resp (!) 24    SpO2 99%   Physical Exam Constitutional:      General: He is not in acute distress. HENT:     Head:     Comments: Scalp abrasion with dried blood on occipital area    Nose: Nose normal. No congestion or rhinorrhea.     Mouth/Throat:     Mouth: Mucous membranes are dry.  Eyes:     Extraocular Movements: Extraocular movements intact.     Conjunctiva/sclera: Conjunctivae normal.     Pupils: Pupils are equal, round, and reactive to light.  Neck:     Musculoskeletal: Normal range of motion. No neck rigidity or muscular tenderness.  Cardiovascular:     Rate and Rhythm: Normal rate and regular rhythm.     Pulses: Normal pulses.     Heart sounds: No murmur.  Pulmonary:     Effort: Pulmonary effort is normal. No respiratory distress.  Abdominal:     General: Abdomen is flat. Bowel sounds are normal.     Tenderness: There is no abdominal tenderness.  Musculoskeletal: Normal range of motion.        General: No swelling, tenderness, deformity or signs of injury.     Right lower leg: No edema.     Left lower leg: No edema.  Skin:    General: Skin is warm and dry.  Neurological:     Mental Status: He is alert. He is confused.  GCS: GCS eye subscore is 4. GCS verbal subscore is 5. GCS motor subscore is 6.     Cranial Nerves: Dysarthria and facial asymmetry (Left-sided facial droop) present.     Motor: Weakness (4/5 grip strength of left hand, 4/5 strength on bilateral hip flexion) and pronator drift (On left) present. No tremor or abnormal muscle  tone.     Coordination: Finger-Nose-Finger Test abnormal (On left).      ED Treatments / Results  Labs (all labs ordered are listed, but only abnormal results are displayed) Labs Reviewed  COMPREHENSIVE METABOLIC PANEL - Abnormal; Notable for the following components:      Result Value   CO2 19 (*)    Glucose, Bld 149 (*)    Calcium 8.7 (*)    Total Protein 5.2 (*)    Albumin 3.3 (*)    Alkaline Phosphatase 27 (*)    All other components within normal limits  CBC WITH DIFFERENTIAL/PLATELET - Abnormal; Notable for the following components:   WBC 3.5 (*)    RBC 2.73 (*)    Hemoglobin 9.4 (*)    HCT 27.4 (*)    MCV 100.4 (*)    MCH 34.4 (*)    Lymphs Abs 0.5 (*)    All other components within normal limits  URINALYSIS, ROUTINE W REFLEX MICROSCOPIC    EKG EKG Interpretation  Date/Time:  Wednesday October 05 2018 17:09:37 EDT Ventricular Rate:  92 PR Interval:    QRS Duration: 81 QT Interval:  347 QTC Calculation: 430 R Axis:   -44 Text Interpretation:  Sinus rhythm Left anterior fascicular block Abnormal R-wave progression, late transition Baseline wander in lead(s) I III aVL Nonspecific TW changes now ipmroved in III and aVF but present in aVL   Confirmed by Gareth Morgan [phone]) on 10/05/2018 6:05:59 PM   Radiology Ct Head Wo Contrast  Result Date: 10/05/2018 CLINICAL DATA:  Found on the ground with a head laceration. Previous partial resection of glioblastoma. EXAM: CT HEAD WITHOUT CONTRAST CT CERVICAL SPINE WITHOUT CONTRAST TECHNIQUE: Multidetector CT imaging of the head and cervical spine was performed following the standard protocol without intravenous contrast. Multiplanar CT image reconstructions of the cervical spine were also generated. COMPARISON:  MRI 08/09/2018.  Head CT 05/24/2018 FINDINGS: CT HEAD FINDINGS Brain: Previous right craniotomy for tumor debulking. Old small vessel ischemic changes of the pons. No focal cerebellar finding. Left hemisphere shows  atrophy and mild chronic small-vessel ischemic change. On the right, there has been previous tumor resection in the region of the insula. There is chronic volume loss and white matter low density in that region. White matter low density may be somewhat increased in could represent worsened edema. There are new foci of hyperdensity within the deep resection region in the radiating white matter tracts that could represent dystrophic calcification or petechial blood products. No confluent hematoma. Postoperative right-sided subdural collection is unchanged at 7 to 8 mm maximal thickness. No hydrocephalus. Vascular: There is atherosclerotic calcification of the major vessels at the base of the brain. Skull: Right-sided craniotomy changes as noted above. No acute traumatic skull finding. Sinuses/Orbits: Chronic left maxillary sinus opacification. Other: None CT CERVICAL SPINE FINDINGS Alignment: Mild upper cervical curvature convex to the right and lower cervical curvature convex to the left. Straightening of the normal cervical lordosis. No traumatic malalignment. Skull base and vertebrae: No cervical spine fracture. Soft tissues and spinal canal: Negative Disc levels: Ordinary osteoarthritis at the C1-2 articulation. Facet arthropathy on the left at C2-3,  C3-4 and C4-5. Degenerative spondylosis at C3-4, C4-5, C5-6 and C6-7. Foraminal narrowing at C4-5, C5-6 and C6-7. Upper chest: Negative Other: None IMPRESSION: Head CT: No acute traumatic finding. Previous right-sided craniotomy for debulking of glioblastoma. Some increase in white matter signal which could represent increased edema or gliosis. Development of punctate hyperdensity along the deep margin of the tumor resection which could be dystrophic calcification related to treatment or petechial blood products. No large or confluent hematoma. Postoperative right convexity subdural hematoma is no larger, 7-8 mm in maximal thickness. Cervical spine CT: No acute or  traumatic finding. 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 Electronically Signed   By: Mark  Shogry M.D.   On: 10/05/2018 18:47  ° °Ct Cervical Spine Wo Contrast ° °Result Date: 10/05/2018 °CLINICAL DATA:  Found on the ground with a head laceration. Previous partial resection of glioblastoma. EXAM: CT HEAD WITHOUT CONTRAST CT CERVICAL SPINE WITHOUT CONTRAST TECHNIQUE: Multidetector CT imaging of the head and cervical spine was performed following the standard protocol without intravenous contrast. Multiplanar CT image reconstructions of the cervical spine were also generated. COMPARISON:  MRI 08/09/2018.  Head CT 05/24/2018 FINDINGS: CT HEAD FINDINGS Brain: Previous right craniotomy for tumor debulking. Old small vessel ischemic changes of the pons. No focal cerebellar finding. Left hemisphere shows atrophy and mild chronic small-vessel ischemic change. On the right, there has been previous tumor resection in the region of the insula. There is chronic volume loss and white matter low density in that region. White matter low density may be somewhat increased in could represent worsened edema. There are new foci of hyperdensity within the deep resection region in the radiating white matter tracts that could represent dystrophic calcification or petechial blood products. No confluent hematoma. Postoperative right-sided subdural collection is unchanged at 7 to 8 mm maximal thickness. No hydrocephalus. Vascular: There is atherosclerotic calcification of the major vessels at the base of the brain. Skull: Right-sided craniotomy changes as noted above. No acute traumatic skull finding. Sinuses/Orbits: Chronic left maxillary sinus opacification. Other: None CT CERVICAL SPINE FINDINGS Alignment: Mild upper cervical curvature convex to the right and lower cervical curvature convex to the left. Straightening of the normal cervical lordosis. No traumatic malalignment. Skull base and vertebrae: No  cervical spine fracture. Soft tissues and spinal canal: Negative Disc levels: Ordinary osteoarthritis at the C1-2 articulation. Facet arthropathy on the left at C2-3, C3-4 and C4-5. Degenerative spondylosis at C3-4, C4-5, C5-6 and C6-7. Foraminal narrowing at C4-5, C5-6 and C6-7. Upper chest: Negative Other: None IMPRESSION: Head CT: No acute traumatic finding. Previous right-sided craniotomy for debulking of glioblastoma. Some increase in white matter signal which could represent increased edema or gliosis. Development of punctate hyperdensity along the deep margin of the tumor resection which could be dystrophic calcification related to treatment or petechial blood products. No large or confluent hematoma. Postoperative right convexity subdural hematoma is no larger, 7-8 mm in maximal thickness. Cervical spine CT: No acute or traumatic finding. 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 Electronically Signed   By: Mark  Shogry M.D.   On: 10/05/2018 18:47  ° ° °Procedures °Procedures (including critical care time) ° °Medications Ordered in ED °Medications  °sodium chloride 0.9 % bolus 500 mL (0 mLs Intravenous Stopped 10/05/18 1907)  °acetaminophen (TYLENOL) tablet 1,000 mg (1,000 mg Oral Given 10/05/18 1905)  °ondansetron (ZOFRAN-ODT) disintegrating tablet 4 mg (4 mg Oral Given 10/05/18 1905)  ° ° ° °Initial Impression / Assessment and Plan / ED Course  °I   have reviewed the triage vital signs and the nursing notes. ° °Pertinent labs & imaging results that were available during my care of the patient were reviewed by me and considered in my medical decision making (see chart for details). ° °  ° ° Will obtain CT head without contrast and CT cervical spine to ensure that there are no acute injuries from the fall.  Rhabdomyolysis is not likely given the short time he was down, so will not check a CK.  Scalp abrasion was cleaned and visualized well, and it did not need repair. ° °CT head and  cervical spine are negative for acute abnormalities due to trauma.  Patient appears less confused and is able to follow instructions and responds appropriately.  He was felt appropriate for discharge, and his wife was informed and was also agreeable to this. ° ° ° °Final Clinical Impressions(s) / ED Diagnoses  ° °Final diagnoses:  °Minor head injury, initial encounter  ° ° °ED Discharge Orders   ° None  °  ° °  °Winfrey, Amanda C, MD °10/05/18 2042 ° °  °Schlossman, Erin, MD °10/08/18 1055 ° °

## 2018-10-05 NOTE — ED Notes (Signed)
Notified Dr. Billy Fischer while at patient bedside that he is complaining of pain where he hit his head and nausea. Billy Fischer, MD gave verbal order (see MAR) for pain and nausea medication.

## 2018-10-05 NOTE — ED Triage Notes (Signed)
Pt here found out side of his house lying on the ground by a a bystander , lac to back of head , per family pt normally alert and oriented but only alert to self on arrival

## 2018-10-06 ENCOUNTER — Telehealth: Payer: Self-pay | Admitting: *Deleted

## 2018-10-06 NOTE — Telephone Encounter (Signed)
Received call from patients wife to report that patient fell yesterday and was taken to Emergency Room.  Patient was confused after fall with memory loss/nausea/hematoma.  Patient had CT scans that revealed no immediate concerns.  Patient this morning was very dizzy earlier in the day but since has resolved.  Needed clarification on if she should proceed with him getting his MRI.  Per Dr Mickeal Skinner yes.  Message relayed to wife.  No further questions.

## 2018-10-07 ENCOUNTER — Ambulatory Visit
Admission: RE | Admit: 2018-10-07 | Discharge: 2018-10-07 | Disposition: A | Discharge status: Home / Self Care | Payer: Medicare Other | Source: Ambulatory Visit | Attending: Internal Medicine | Admitting: Internal Medicine

## 2018-10-07 ENCOUNTER — Other Ambulatory Visit: Payer: Self-pay

## 2018-10-07 DIAGNOSIS — C711 Malignant neoplasm of frontal lobe: Secondary | ICD-10-CM

## 2018-10-07 DIAGNOSIS — C719 Malignant neoplasm of brain, unspecified: Secondary | ICD-10-CM | POA: Diagnosis not present

## 2018-10-07 MED ORDER — GADOBENATE DIMEGLUMINE 529 MG/ML IV SOLN
14.0000 mL | Freq: Once | INTRAVENOUS | Status: AC | PRN
  Administered 2018-10-07: 14 mL via INTRAVENOUS

## 2018-10-10 ENCOUNTER — Inpatient Hospital Stay: Payer: Medicare Other | Attending: Radiation Oncology

## 2018-10-10 ENCOUNTER — Inpatient Hospital Stay (HOSPITAL_BASED_OUTPATIENT_CLINIC_OR_DEPARTMENT_OTHER): Payer: Medicare Other | Admitting: Internal Medicine

## 2018-10-10 ENCOUNTER — Other Ambulatory Visit: Payer: Self-pay

## 2018-10-10 VITALS — BP 124/63 | HR 79 | Temp 99.4°F | Resp 18 | Ht 64.5 in | Wt 147.1 lb

## 2018-10-10 DIAGNOSIS — C711 Malignant neoplasm of frontal lobe: Secondary | ICD-10-CM

## 2018-10-10 DIAGNOSIS — E7800 Pure hypercholesterolemia, unspecified: Secondary | ICD-10-CM

## 2018-10-10 DIAGNOSIS — R112 Nausea with vomiting, unspecified: Secondary | ICD-10-CM | POA: Insufficient documentation

## 2018-10-10 DIAGNOSIS — I10 Essential (primary) hypertension: Secondary | ICD-10-CM | POA: Insufficient documentation

## 2018-10-10 DIAGNOSIS — E119 Type 2 diabetes mellitus without complications: Secondary | ICD-10-CM

## 2018-10-10 DIAGNOSIS — Z923 Personal history of irradiation: Secondary | ICD-10-CM

## 2018-10-10 DIAGNOSIS — Z9221 Personal history of antineoplastic chemotherapy: Secondary | ICD-10-CM

## 2018-10-10 DIAGNOSIS — E079 Disorder of thyroid, unspecified: Secondary | ICD-10-CM | POA: Diagnosis not present

## 2018-10-10 DIAGNOSIS — Z87891 Personal history of nicotine dependence: Secondary | ICD-10-CM | POA: Diagnosis not present

## 2018-10-10 DIAGNOSIS — Z79899 Other long term (current) drug therapy: Secondary | ICD-10-CM | POA: Diagnosis not present

## 2018-10-10 DIAGNOSIS — Z7984 Long term (current) use of oral hypoglycemic drugs: Secondary | ICD-10-CM | POA: Insufficient documentation

## 2018-10-10 LAB — CMP (CANCER CENTER ONLY)
ALT: 22 U/L (ref 0–44)
AST: 14 U/L — ABNORMAL LOW (ref 15–41)
Albumin: 3.8 g/dL (ref 3.5–5.0)
Alkaline Phosphatase: 43 U/L (ref 38–126)
Anion gap: 11 (ref 5–15)
BUN: 24 mg/dL — ABNORMAL HIGH (ref 8–23)
CO2: 23 mmol/L (ref 22–32)
Calcium: 8.9 mg/dL (ref 8.9–10.3)
Chloride: 102 mmol/L (ref 98–111)
Creatinine: 1.06 mg/dL (ref 0.61–1.24)
GFR, Est AFR Am: >60 mL/min (ref >60)
GFR, Estimated: >60 mL/min (ref >60)
Glucose, Bld: 124 mg/dL — ABNORMAL HIGH (ref 70–99)
Potassium: 3.8 mmol/L (ref 3.5–5.1)
Sodium: 136 mmol/L (ref 135–145)
Total Bilirubin: 0.5 mg/dL (ref 0.3–1.2)
Total Protein: 6.4 g/dL — ABNORMAL LOW (ref 6.5–8.1)

## 2018-10-10 LAB — CBC WITH DIFFERENTIAL (CANCER CENTER ONLY)
Abs Immature Granulocytes: 0.07 10*3/uL (ref 0.00–0.07)
Basophils Absolute: 0 10*3/uL (ref 0.0–0.1)
Basophils Relative: 0 %
Eosinophils Absolute: 0 10*3/uL (ref 0.0–0.5)
Eosinophils Relative: 1 %
HCT: 28.5 % — ABNORMAL LOW (ref 39.0–52.0)
Hemoglobin: 9.8 g/dL — ABNORMAL LOW (ref 13.0–17.0)
Immature Granulocytes: 2 %
Lymphocytes Relative: 20 %
Lymphs Abs: 0.9 10*3/uL (ref 0.7–4.0)
MCH: 33.9 pg (ref 26.0–34.0)
MCHC: 34.4 g/dL (ref 30.0–36.0)
MCV: 98.6 fL (ref 80.0–100.0)
Monocytes Absolute: 0.7 10*3/uL (ref 0.1–1.0)
Monocytes Relative: 15 %
Neutro Abs: 2.9 10*3/uL (ref 1.7–7.7)
Neutrophils Relative %: 62 %
Platelet Count: 156 10*3/uL (ref 150–400)
RBC: 2.89 MIL/uL — ABNORMAL LOW (ref 4.22–5.81)
RDW: 14.4 % (ref 11.5–15.5)
WBC Count: 4.6 10*3/uL (ref 4.0–10.5)
nRBC: 0 % (ref 0.0–0.2)

## 2018-10-10 MED ORDER — TEMOZOLOMIDE 100 MG PO CAPS: 100.0000 mg | ORAL_CAPSULE | Freq: Every day | ORAL | 0 refills | Status: DC

## 2018-10-10 MED ORDER — TEMOZOLOMIDE 20 MG PO CAPS: 20.0000 mg | ORAL_CAPSULE | Freq: Every day | ORAL | 0 refills | Status: DC

## 2018-10-10 MED ORDER — TEMOZOLOMIDE 140 MG PO CAPS: 140.0000 mg | ORAL_CAPSULE | Freq: Every day | ORAL | 0 refills | Status: DC

## 2018-10-10 NOTE — Progress Notes (Signed)
[facility] [place], [place] [address] [phone]   Interval Evaluation  Date of Service: 10/10/18 Patient Name: Bryan [name] Patient MRN: [id number] Patient DOB: 01/27/1941 Provider: Ventura Sellers, MD  Identifying Statement:  Bryan [name] is a 78 y.o. male with right frontal glioblastoma   Oncologic History: Oncology History   Glioblastoma, IDH-wildtype ([facility])   03/07/2018 Surgery   Craniotomy, resection by Dr. Tommi Rumps ([facility]).  Path is Glioblastoma   03/29/2018 - 04/04/2018 Chemotherapy   The patient had [No matching medication found in this treatment plan]  for chemotherapy treatment.    04/04/2018 - 05/13/2018 Radiation Therapy   IMRT and concurrent Temozolomide with Dr. Tammi Klippel   06/09/2018 -  Chemotherapy   The patient had [No matching medication found in this treatment plan]  for chemotherapy treatment.      Biomarkers:  MGMT Unknown.  IDH 1/2 Wild type.  EGFR Expressed  TERT Unknown   Interval History: Bryan [name] presents today for follow up after completing cycle #3 TMZ.  He did experience a fall this past week.  He had been working outside in prolonged heat, then had long walk uphill along driveway at which time he feels he "slipped".  Did hit his head and required ED visit and trauma eval.  No intervention, he was fully back to normal within 2 days.  No clear seizure activity noted.  Energy otherwise continues to improve.  GI upset, nausea, have not recurred.  Speaking/fluency is stable. History is overall still limited by language impairments, wife assisted again with history.    H+P (02/16/18) Bryan [name] 78 y.o. male presented after family noted drooping of the left side of his face yesterday.  In addition, he/they describe some cognitive changes, such as difficulty "finding words" in certain situations.  The patient also feels like his left leg is "lagging" a little behind his right lately.  He is  walking on his own, however, and with no falls.  Denies headaches, seizures.   Medications: Current Outpatient Medications on File Prior to Visit  Medication Sig Dispense Refill   acetaminophen (TYLENOL) 325 MG tablet Take 2 tablets (650 mg total) by mouth every 6 (six) hours as needed for mild pain.     atorvastatin (LIPITOR) 40 MG tablet Take 1 tablet (40 mg total) by mouth daily. 30 tablet 0   dexamethasone (DECADRON) 1 MG tablet Take 1 tablet (1 mg total) by mouth 2 (two) times daily. 60 tablet 3   Flaxseed, Linseed, (FLAXSEED OIL PO) Take by mouth.     fluticasone (FLONASE) 50 MCG/ACT nasal spray Place 1 spray into both nostrils 2 (two) times daily.     Ginkgo Biloba Extract (GINKGO BILOBA MEMORY ENHANCER) 60 MG CAPS Take by mouth.     levETIRAcetam (KEPPRA) 100 MG/ML solution Take 15 mLs (1,500 mg total) by mouth 2 (two) times daily. 473 mL 12   levothyroxine (SYNTHROID, LEVOTHROID) 75 MCG tablet Take 1 tablet (75 mcg total) by mouth daily before breakfast. 30 tablet 0   lisinopril (PRINIVIL,ZESTRIL) 5 MG tablet Take 3 tablets (15 mg total) by mouth daily. 30 tablet 1   metFORMIN (GLUCOPHAGE) 1000 MG tablet Take 1 tablet (1,000 mg total) by mouth 2 (two) times daily with a meal. 60 tablet 1   Multiple Vitamins-Minerals (MULTI-DAY PLUS MINERALS) TABS Take 1 tablet by mouth daily.     naphazoline-pheniramine (NAPHCON-A) 0.025-0.3 % ophthalmic solution Place 1 drop into both eyes as needed  for eye irritation or allergies.     Omega-3 1000 MG CAPS Take by mouth.     ondansetron (ZOFRAN) 4 MG tablet Take 1 tablet (4 mg total) by mouth every 6 (six) hours as needed for nausea or vomiting. 30 tablet 1   ondansetron (ZOFRAN) 8 MG tablet Take 1 tablet (8 mg total) by mouth 2 (two) times daily as needed (nausea and vomiting). take 30-60 minutes prior to Temodar 30 tablet 1   ondansetron (ZOFRAN) 8 MG tablet Take 1 tablet (8 mg total) by mouth 2 (two) times daily as needed (nausea and  vomiting). take 30-60 minutes prior to Temodar 30 tablet 1   pantoprazole (PROTONIX) 40 MG tablet Take 1 tablet (40 mg total) by mouth daily. 30 tablet 3   temozolomide (TEMODAR) 100 MG capsule Take 1 capsule (100 mg total) by mouth daily. Take for 5 days on, 23 d off, repeat every 28d. May take on an empty stomach to decrease N/V 5 capsule 0   temozolomide (TEMODAR) 100 MG capsule Take 1 capsule (100 mg total) by mouth daily. Take for 5 days on, 23 d off, repeat every 28d. May take on an empty stomach to decrease N/V 5 capsule 0   temozolomide (TEMODAR) 100 MG capsule Take 1 capsule (100 mg total) by mouth daily. Take for 5 days on, 23 d off, repeat every 28d. May take on an empty stomach to decrease N/V 5 capsule 0   temozolomide (TEMODAR) 140 MG capsule Take 1 capsule (140 mg total) by mouth daily. Take for 5 days on, 23 d off, repeat every 28d. May take on an empty stomach to decrease N/V 5 capsule 0   temozolomide (TEMODAR) 140 MG capsule Take 1 capsule (140 mg total) by mouth daily. Take for 5 days on, 23 d off, repeat every 28d. May take on an empty stomach to decrease N/V 5 capsule 0   temozolomide (TEMODAR) 140 MG capsule Take 1 capsule (140 mg total) by mouth daily. Take for 5 days on, 23 d off, repeat every 28d. May take on an empty stomach to decrease N/V 5 capsule 0   temozolomide (TEMODAR) 20 MG capsule Take 1 capsule (20 mg total) by mouth daily. Take for 5 days on, 23 days off, repeat every 28d. May take on an empty stomach to decrease N/V 5 capsule 0   temozolomide (TEMODAR) 20 MG capsule Take 1 capsule (20 mg total) by mouth daily. Take for 5 days on, 23 days off, repeat every 28d. May take on an empty stomach to decrease N/V 5 capsule 0   temozolomide (TEMODAR) 20 MG capsule Take 1 capsule (20 mg total) by mouth daily. Take for 5 days on, 23 days off, repeat every 28d. May take on an empty stomach to decrease N/V 5 capsule 0   zonisamide (ZONEGRAN) 100 MG capsule Take 1  capsule (100 mg total) by mouth daily. 30 capsule 3   No current facility-administered medications on file prior to visit.     Allergies:  Allergies  Allergen Reactions   Other     Cats, pollen   Past Medical History:  Past Medical History:  Diagnosis Date   Arthritis    Brain cancer ([place])    Glioblastoma   Diabetes mellitus without complication ([place])    GERD (gastroesophageal reflux disease)    Hypercholesterolemia    Hypertension    Thyroid disease    Past Surgical History:  Past Surgical History:  Procedure Laterality Date  CRANIOTOMY     HERNIA REPAIR     squamous cell skin 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     Social History:  Social History   Socioeconomic History   Marital status: Married    Spouse name: Not on file   Number of children: Not on file   Years of education: Not on file   Highest education level: Not on file  Occupational History   Not on file  Social Needs   Financial resource strain: Not on file   Food insecurity    Worry: Not on file    Inability: Not on file   Transportation needs    Medical: Not on file    Non-medical: Not on file  Tobacco Use   Smoking status: Former Smoker    Packs/day: 0.25    Years: 3.00    Pack years: 0.75    Types: Cigarettes    Quit date: 03/23/1961    Years since quitting: 57.5   Smokeless tobacco: Never Used  Substance and Sexual Activity   Alcohol use: Yes    Alcohol/week: 1.0 standard drinks    Types: 1 Glasses of wine per week    Comment: weekly   Drug use: Never   Sexual activity: Not Currently  Lifestyle   Physical activity    Days per week: Not on file    Minutes per session: Not on file   Stress: Not on file  Relationships   Social connections    Talks on phone: Not on file    Gets together: Not on file    Attends religious service: Not on file    Active member of club or organization: Not on file    Attends meetings of clubs or organizations: Not  on file    Relationship status: Not on file   Intimate partner violence    Fear of current or ex partner: Not on file    Emotionally abused: Not on file    Physically abused: Not on file    Forced sexual activity: Not on file  Other Topics Concern   Not on file  Social History Narrative   Married. Resides in [place].   Family History:  Family History  Problem Relation Age of Onset   Diabetes Mellitus II Mother    ALS Father     Review of Systems: Constitutional: Denies fevers, chills or abnormal weight loss Eyes: Denies blurriness of vision Ears, nose, mouth, throat, and face: Denies mucositis or sore throat Respiratory: Denies cough, dyspnea or wheezes Cardiovascular: Denies palpitation, chest discomfort or lower extremity swelling Gastrointestinal:  Denies nausea, constipation, diarrhea GU: Denies dysuria or incontinence Skin: Denies abnormal skin rashes Neurological: Per HPI Musculoskeletal: Denies joint pain, back or neck discomfort. No decrease in ROM Behavioral/Psych: Denies anxiety, disturbance in thought content, and mood instability  Physical Exam: Vitals:   10/10/18 1149  BP: 124/63  Pulse: 79  Resp: 18  Temp: 99.4 F (37.4 C)  SpO2: 100%    KPS: 70. General: Alert, cooperative, pleasant, in no acute distress Head: Craniotomy scar noted, dry and intact. EENT: No conjunctival injection or scleral icterus. Oral mucosa moist Lungs: Resp effort normal Cardiac: Regular rate and rhythm Abdomen: Soft, non-distended abdomen Skin: No rashes cyanosis or petechiae. Extremities: no edema  Neurologic Exam: Mental Status: Awake, alert, attentive to examiner. Oriented to self and environment. Language is fairly densely impaired with regards to fluency, comprehension and repetition.   Cranial Nerves: Visual acuity is grossly normal.  Visual fields are full. Extra-ocular movements intact. No ptosis. L UMN facial paresis, tongue midline. Motor: Tone and bulk are  normal. Power is 4+/5 in left arm and 4+/5 in leg, with impaired fine motor function in left hand. Reflexes are symmetric, no pathologic reflexes present. Intact finger to nose bilaterally Sensory: Intact to light touch and temperature Gait: Cane assisted  Labs: I have reviewed the data as listed    Component Value Date/Time   NA 139 10/05/2018 1757   K 3.7 10/05/2018 1757   CL 110 10/05/2018 1757   CO2 19 (L) 10/05/2018 1757   GLUCOSE 149 (H) 10/05/2018 1757   BUN 21 10/05/2018 1757   CREATININE 1.05 10/05/2018 1757   CREATININE 1.20 09/12/2018 1023   CALCIUM 8.7 (L) 10/05/2018 1757   PROT 5.2 (L) 10/05/2018 1757   ALBUMIN 3.3 (L) 10/05/2018 1757   AST 17 10/05/2018 1757   AST 12 (L) 09/12/2018 1023   ALT 21 10/05/2018 1757   ALT 23 09/12/2018 1023   ALKPHOS 27 (L) 10/05/2018 1757   BILITOT 0.4 10/05/2018 1757   BILITOT 0.5 09/12/2018 1023   GFRNONAA >60 10/05/2018 1757   GFRNONAA 58 (L) 09/12/2018 1023   GFRAA >60 10/05/2018 1757   GFRAA >60 09/12/2018 1023   Lab Results  Component Value Date   WBC 3.5 (L) 10/05/2018   NEUTROABS 2.4 10/05/2018   HGB 9.4 (L) 10/05/2018   HCT 27.4 (L) 10/05/2018   MCV 100.4 (H) 10/05/2018   PLT 156 10/05/2018    Imaging:  [place] Clinician Interpretation: I have personally reviewed the CNS images as listed.  My interpretation, in the context of the patient's clinical presentation, is stable disease  Ct Head Wo Contrast  Result Date: 10/05/2018 CLINICAL DATA:  Found on the ground with a head laceration. Previous partial resection of glioblastoma. EXAM: CT HEAD WITHOUT CONTRAST CT CERVICAL SPINE WITHOUT CONTRAST TECHNIQUE: Multidetector CT imaging of the head and cervical spine was performed following the standard protocol without intravenous contrast. Multiplanar CT image reconstructions of the cervical spine were also generated. COMPARISON:  MRI 08/09/2018.  Head CT 05/24/2018 FINDINGS: CT HEAD FINDINGS Brain: Previous right craniotomy for  tumor debulking. Old small vessel ischemic changes of the pons. No focal cerebellar finding. Left hemisphere shows atrophy and mild chronic small-vessel ischemic change. On the right, there has been previous tumor resection in the region of the insula. There is chronic volume loss and white matter low density in that region. White matter low density may be somewhat increased in could represent worsened edema. There are new foci of hyperdensity within the deep resection region in the radiating white matter tracts that could represent dystrophic calcification or petechial blood products. No confluent hematoma. Postoperative right-sided subdural collection is unchanged at 7 to 8 mm maximal thickness. No hydrocephalus. Vascular: There is atherosclerotic calcification of the major vessels at the base of the brain. Skull: Right-sided craniotomy changes as noted above. No acute traumatic skull finding. Sinuses/Orbits: Chronic left maxillary sinus opacification. Other: None CT CERVICAL SPINE FINDINGS Alignment: Mild upper cervical curvature convex to the right and lower cervical curvature convex to the left. Straightening of the normal cervical lordosis. No traumatic malalignment. Skull base and vertebrae: No cervical spine fracture. Soft tissues and spinal canal: Negative Disc levels: Ordinary osteoarthritis at the C1-2 articulation. Facet arthropathy on the left at C2-3, C3-4 and C4-5. Degenerative spondylosis at C3-4, C4-5, C5-6 and C6-7. Foraminal narrowing at C4-5, C5-6 and C6-7. Upper chest: Negative Other: None IMPRESSION:  Head CT: No acute traumatic finding. Previous right-sided craniotomy for debulking of glioblastoma. Some increase in white matter signal which could represent increased edema or gliosis. Development of punctate hyperdensity along the deep margin of the tumor resection which could be dystrophic calcification related to treatment or petechial blood products. No large or confluent hematoma.  Postoperative right convexity subdural hematoma is no larger, 7-8 mm in maximal thickness. Cervical spine CT: No acute or traumatic finding. 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 Electronically Signed   By: Nelson Chimes M.D.   On: 10/05/2018 18:47   Ct Cervical Spine Wo Contrast  Result Date: 10/05/2018 CLINICAL DATA:  Found on the ground with a head laceration. Previous partial resection of glioblastoma. EXAM: CT HEAD WITHOUT CONTRAST CT CERVICAL SPINE WITHOUT CONTRAST TECHNIQUE: Multidetector CT imaging of the head and cervical spine was performed following the standard protocol without intravenous contrast. Multiplanar CT image reconstructions of the cervical spine were also generated. COMPARISON:  MRI 08/09/2018.  Head CT 05/24/2018 FINDINGS: CT HEAD FINDINGS Brain: Previous right craniotomy for tumor debulking. Old small vessel ischemic changes of the pons. No focal cerebellar finding. Left hemisphere shows atrophy and mild chronic small-vessel ischemic change. On the right, there has been previous tumor resection in the region of the insula. There is chronic volume loss and white matter low density in that region. White matter low density may be somewhat increased in could represent worsened edema. There are new foci of hyperdensity within the deep resection region in the radiating white matter tracts that could represent dystrophic calcification or petechial blood products. No confluent hematoma. Postoperative right-sided subdural collection is unchanged at 7 to 8 mm maximal thickness. No hydrocephalus. Vascular: There is atherosclerotic calcification of the major vessels at the base of the brain. Skull: Right-sided craniotomy changes as noted above. No acute traumatic skull finding. Sinuses/Orbits: Chronic left maxillary sinus opacification. Other: None CT CERVICAL SPINE FINDINGS Alignment: Mild upper cervical curvature convex to the right and lower cervical curvature convex  to the left. Straightening of the normal cervical lordosis. No traumatic malalignment. Skull base and vertebrae: No cervical spine fracture. Soft tissues and spinal canal: Negative Disc levels: Ordinary osteoarthritis at the C1-2 articulation. Facet arthropathy on the left at C2-3, C3-4 and C4-5. Degenerative spondylosis at C3-4, C4-5, C5-6 and C6-7. Foraminal narrowing at C4-5, C5-6 and C6-7. Upper chest: Negative Other: None IMPRESSION: Head CT: No acute traumatic finding. Previous right-sided craniotomy for debulking of glioblastoma. Some increase in white matter signal which could represent increased edema or gliosis. Development of punctate hyperdensity along the deep margin of the tumor resection which could be dystrophic calcification related to treatment or petechial blood products. No large or confluent hematoma. Postoperative right convexity subdural hematoma is no larger, 7-8 mm in maximal thickness. Cervical spine CT: No acute or traumatic finding. 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 Electronically Signed   By: Nelson Chimes M.D.   On: 10/05/2018 18:47   Mr Jeri Cos LF Contrast  Result Date: 10/07/2018 CLINICAL DATA:  Follow-up glioblastoma. Fell 2 days ago with subsequent nausea vomiting. EXAM: MRI HEAD WITHOUT AND WITH CONTRAST TECHNIQUE: Multiplanar, multiecho pulse sequences of the brain and surrounding structures were obtained without and with intravenous contrast. CONTRAST:  9m MULTIHANCE GADOBENATE DIMEGLUMINE 529 MG/ML IV SOLN COMPARISON:  Head CT 10/05/2018.  MRI 08/09/2018. FINDINGS: Brain: Slight interval contraction of the post resection cavity in the right insular region, representative dimension on the coronal sequence reduced from 28 mm to  23 mm. No increasing thickness the enhancing tissue. White matter T2 and FLAIR signal pattern appears very similar. Susceptibility weighted imaging does not show a specific correlate to the small hyperdense foci seen on  CT elongate deep margin, suggesting that this more likely represents calcification. Elsewhere, chronic small-vessel ischemic changes of the white matter appears stable. Wallerian degeneration of the brainstem on the right as expected. Ventricular size is stable. Small amount of extra-axial fluid is stable. Vascular: Major vessels at the base of the brain show flow. Skull and upper cervical spine: Surgical changes on the right. No new finding. Sinuses/Orbits: Opacification of the left maxillary sinus. Other sinuses clear. Orbits negative. Other: None IMPRESSION: Since the study of May, there has been slight contraction of the post resection space in the right deep insular region. Representative dimension on the coronal sequence reduced from 28 mm to 23 mm. No change in the volume of enhancing tissue along the margins. No change in the hemispheric FLAIR or T2 signal. Electronically Signed   By: Nelson Chimes M.D.   On: 10/07/2018 13:32    Assessment/Plan 1.  Glioblastoma, IDH-wildtype [facility])   Mr. Bacallao is clinically and radiographically stable today.  Labs are WNL.    We recommended continuing treatment with cycle #4 Temozolomide 186m/m2, on for five days and off for twenty three days in twenty eight day cycles. The patient will have a complete blood count performed on days 21 and 28 of each cycle, and a comprehensive metabolic panel performed on day 28 of each cycle. Labs may need to be performed more often. Zofran will prescribed for home use for nausea/vomiting.   Chemotherapy should be held for the following:  ANC less than 1,000  Platelets less than 100,000  LFT or creatinine greater than 2x ULN  If clinical concerns/contraindications develop  Etiology of fall was likely mechanical.  He should continue Keppra 15074mBID and Zonisamide 10076mID for seizure prevention.  We recommended continuing dexamethasone 1mg64mD partly for appetite stimulation.  We asked him to return to clinic in 4  weeks with labs for evaluation.  We appreciate the opportunity to participate in the care of [facility] total time spent in the encounter was 40 minutes and more than 50% was on counseling and review of test results   ZachVentura Sellers Medical Director of Neuro-Oncology [email or web address] 11:37 AM

## 2018-10-11 ENCOUNTER — Telehealth: Payer: Self-pay | Admitting: Internal Medicine

## 2018-10-11 MED FILL — TEMOZOLOMIDE 20 MG CAPS: 20 | 28 days supply | Qty: 5 | Fill #0

## 2018-10-11 MED FILL — TEMOZOLOMIDE 140 MG CAPS: 140 | 28 days supply | Qty: 5 | Fill #0

## 2018-10-11 MED FILL — TEMOZOLOMIDE 100 MG CAPS: 100 | 28 days supply | Qty: 5 | Fill #0

## 2018-10-11 NOTE — Telephone Encounter (Signed)
Scheduled appt per 7/20 los. ° °Printed and mailed appt calendar. °

## 2018-11-01 ENCOUNTER — Other Ambulatory Visit: Payer: Self-pay | Admitting: Internal Medicine

## 2018-11-01 MED FILL — DEXAMETHASONE 1 MG TABLET: 1 | 15 days supply | Qty: 30 | Fill #0

## 2018-11-01 NOTE — Telephone Encounter (Signed)
See below

## 2018-11-07 ENCOUNTER — Other Ambulatory Visit: Payer: Self-pay

## 2018-11-07 ENCOUNTER — Inpatient Hospital Stay (HOSPITAL_BASED_OUTPATIENT_CLINIC_OR_DEPARTMENT_OTHER): Payer: Medicare Other | Admitting: Internal Medicine

## 2018-11-07 ENCOUNTER — Telehealth: Payer: Self-pay | Admitting: Internal Medicine

## 2018-11-07 ENCOUNTER — Inpatient Hospital Stay: Payer: Medicare Other | Attending: Radiation Oncology

## 2018-11-07 VITALS — BP 130/67 | HR 104 | Temp 98.2°F | Resp 18 | Ht 64.5 in | Wt 146.9 lb

## 2018-11-07 DIAGNOSIS — E559 Vitamin D deficiency, unspecified: Secondary | ICD-10-CM

## 2018-11-07 DIAGNOSIS — R5383 Other fatigue: Secondary | ICD-10-CM

## 2018-11-07 DIAGNOSIS — R6889 Other general symptoms and signs: Secondary | ICD-10-CM | POA: Diagnosis not present

## 2018-11-07 DIAGNOSIS — M1990 Unspecified osteoarthritis, unspecified site: Secondary | ICD-10-CM | POA: Diagnosis not present

## 2018-11-07 DIAGNOSIS — E7800 Pure hypercholesterolemia, unspecified: Secondary | ICD-10-CM | POA: Insufficient documentation

## 2018-11-07 DIAGNOSIS — Z87891 Personal history of nicotine dependence: Secondary | ICD-10-CM | POA: Insufficient documentation

## 2018-11-07 DIAGNOSIS — Z7984 Long term (current) use of oral hypoglycemic drugs: Secondary | ICD-10-CM | POA: Insufficient documentation

## 2018-11-07 DIAGNOSIS — E079 Disorder of thyroid, unspecified: Secondary | ICD-10-CM | POA: Diagnosis not present

## 2018-11-07 DIAGNOSIS — C711 Malignant neoplasm of frontal lobe: Secondary | ICD-10-CM | POA: Diagnosis not present

## 2018-11-07 DIAGNOSIS — R269 Unspecified abnormalities of gait and mobility: Secondary | ICD-10-CM | POA: Diagnosis not present

## 2018-11-07 DIAGNOSIS — Z85828 Personal history of other malignant neoplasm of skin: Secondary | ICD-10-CM | POA: Insufficient documentation

## 2018-11-07 DIAGNOSIS — I10 Essential (primary) hypertension: Secondary | ICD-10-CM | POA: Insufficient documentation

## 2018-11-07 DIAGNOSIS — Z923 Personal history of irradiation: Secondary | ICD-10-CM | POA: Diagnosis not present

## 2018-11-07 DIAGNOSIS — R112 Nausea with vomiting, unspecified: Secondary | ICD-10-CM | POA: Insufficient documentation

## 2018-11-07 DIAGNOSIS — K219 Gastro-esophageal reflux disease without esophagitis: Secondary | ICD-10-CM | POA: Insufficient documentation

## 2018-11-07 DIAGNOSIS — Z79899 Other long term (current) drug therapy: Secondary | ICD-10-CM | POA: Insufficient documentation

## 2018-11-07 DIAGNOSIS — Z9221 Personal history of antineoplastic chemotherapy: Secondary | ICD-10-CM | POA: Insufficient documentation

## 2018-11-07 DIAGNOSIS — R5382 Chronic fatigue, unspecified: Secondary | ICD-10-CM | POA: Insufficient documentation

## 2018-11-07 LAB — CBC WITH DIFFERENTIAL (CANCER CENTER ONLY)
Abs Immature Granulocytes: 0.05 10*3/uL (ref 0.00–0.07)
Basophils Absolute: 0 10*3/uL (ref 0.0–0.1)
Basophils Relative: 0 %
Eosinophils Absolute: 0 10*3/uL (ref 0.0–0.5)
Eosinophils Relative: 0 %
HCT: 32 % — ABNORMAL LOW (ref 39.0–52.0)
Hemoglobin: 11.1 g/dL — ABNORMAL LOW (ref 13.0–17.0)
Immature Granulocytes: 1 %
Lymphocytes Relative: 15 %
Lymphs Abs: 0.8 10*3/uL (ref 0.7–4.0)
MCH: 35 pg — ABNORMAL HIGH (ref 26.0–34.0)
MCHC: 34.7 g/dL (ref 30.0–36.0)
MCV: 100.9 fL — ABNORMAL HIGH (ref 80.0–100.0)
Monocytes Absolute: 0.4 10*3/uL (ref 0.1–1.0)
Monocytes Relative: 8 %
Neutro Abs: 3.8 10*3/uL (ref 1.7–7.7)
Neutrophils Relative %: 76 %
Platelet Count: 159 10*3/uL (ref 150–400)
RBC: 3.17 MIL/uL — ABNORMAL LOW (ref 4.22–5.81)
RDW: 14 % (ref 11.5–15.5)
WBC Count: 5.1 10*3/uL (ref 4.0–10.5)
nRBC: 0 % (ref 0.0–0.2)

## 2018-11-07 LAB — CMP (CANCER CENTER ONLY)
ALT: 22 U/L (ref 0–44)
AST: 14 U/L — ABNORMAL LOW (ref 15–41)
Albumin: 4.1 g/dL (ref 3.5–5.0)
Alkaline Phosphatase: 39 U/L (ref 38–126)
Anion gap: 12 (ref 5–15)
BUN: 23 mg/dL (ref 8–23)
CO2: 23 mmol/L (ref 22–32)
Calcium: 9.5 mg/dL (ref 8.9–10.3)
Chloride: 105 mmol/L (ref 98–111)
Creatinine: 1.09 mg/dL (ref 0.61–1.24)
GFR, Est AFR Am: >60 mL/min (ref >60)
GFR, Estimated: >60 mL/min (ref >60)
Glucose, Bld: 181 mg/dL — ABNORMAL HIGH (ref 70–99)
Potassium: 4.3 mmol/L (ref 3.5–5.1)
Sodium: 140 mmol/L (ref 135–145)
Total Bilirubin: 0.4 mg/dL (ref 0.3–1.2)
Total Protein: 6.6 g/dL (ref 6.5–8.1)

## 2018-11-07 MED ORDER — TEMOZOLOMIDE 100 MG PO CAPS: 100.0000 mg | ORAL_CAPSULE | Freq: Every day | ORAL | 0 refills | Status: DC

## 2018-11-07 MED ORDER — TEMOZOLOMIDE 20 MG PO CAPS: 20.0000 mg | ORAL_CAPSULE | Freq: Every day | ORAL | 0 refills | Status: DC

## 2018-11-07 MED ORDER — TEMOZOLOMIDE 140 MG PO CAPS: 140.0000 mg | ORAL_CAPSULE | Freq: Every day | ORAL | 0 refills | Status: DC

## 2018-11-07 NOTE — Telephone Encounter (Signed)
Scheduled appt per 8/17b los.  Left a voice message of appt date and time

## 2018-11-07 NOTE — Progress Notes (Signed)
[facility] [place], [place] [address] [phone]   Interval Evaluation  Date of Service: 11/07/18 Patient Name: Bryan [name] Patient MRN: [id number] Patient DOB: 06-Apr-1940 Provider: Ventura Sellers, MD  Identifying Statement:  Bryan [name] is a 78 y.o. male with right frontal glioblastoma   Oncologic History: Oncology History   Glioblastoma, IDH-wildtype ([place])   03/07/2018 Surgery   Craniotomy, resection by Dr. Tommi Rumps ([facility]).  Path is Glioblastoma   03/29/2018 - 04/04/2018 Chemotherapy   The patient had [No matching medication found in this treatment plan]  for chemotherapy treatment.    04/04/2018 - 05/13/2018 Radiation Therapy   IMRT and concurrent Temozolomide with Dr. Tammi Klippel   06/09/2018 -  Chemotherapy   The patient had [No matching medication found in this treatment plan]  for chemotherapy treatment.      Biomarkers:  MGMT Unknown.  IDH 1/2 Wild type.  EGFR Expressed  TERT Unknown   Interval History: Bryan [name] presents today for follow up after completing cycle #4 TMZ.  Family describes slight decline in gait, with increased dragging of the left leg.  Patient describes needing to "focus" to use the leg, and that this is not new.  Energy is still fairly low, frequent napping.  He is up much of the night.  Has been taking decadron 29m twice per day, mainly for appetite at this point. GI upset, nausea, have not recurred.  Speaking/fluency is stable.  History is overall still limited by language impairments, wife assisted again with history.    H+P (02/16/18) Bryan Bon89y.o. male presented after family noted drooping of the left side of his face yesterday.  In addition, he/they describe some cognitive changes, such as difficulty "finding words" in certain situations.  The patient also feels like his left leg is "lagging" a little behind his right lately.  He is walking on his own, however, and with no  falls.  Denies headaches, seizures.   Medications: Current Outpatient Medications on File Prior to Visit  Medication Sig Dispense Refill  . acetaminophen (TYLENOL) 325 MG tablet Take 2 tablets (650 mg total) by mouth every 6 (six) hours as needed for mild pain.    .Marland Kitchenatorvastatin (LIPITOR) 40 MG tablet Take 1 tablet (40 mg total) by mouth daily. 30 tablet 0  . dexamethasone (DECADRON) 1 MG tablet TAKE 1 TABLET (1 MG TOTAL) BY MOUTH 2 TIMES DAILY. 30 tablet 0  . Flaxseed, Linseed, (FLAXSEED OIL PO) Take by mouth.    . fluticasone (FLONASE) 50 MCG/ACT nasal spray Place 1 spray into both nostrils 2 (two) times daily.    . Ginkgo Biloba Extract (GINKGO BILOBA MEMORY ENHANCER) 60 MG CAPS Take by mouth.    . levETIRAcetam (KEPPRA) 100 MG/ML solution Take 15 mLs (1,500 mg total) by mouth 2 (two) times daily. 473 mL 12  . levothyroxine (SYNTHROID, LEVOTHROID) 75 MCG tablet Take 1 tablet (75 mcg total) by mouth daily before breakfast. 30 tablet 0  . lisinopril (PRINIVIL,ZESTRIL) 5 MG tablet Take 3 tablets (15 mg total) by mouth daily. 30 tablet 1  . metFORMIN (GLUCOPHAGE) 1000 MG tablet Take 1 tablet (1,000 mg total) by mouth 2 (two) times daily with a meal. 60 tablet 1  . Multiple Vitamins-Minerals (MULTI-DAY PLUS MINERALS) TABS Take 1 tablet by mouth daily.    . naphazoline-pheniramine (NAPHCON-A) 0.025-0.3 % ophthalmic solution Place 1 drop into both eyes as needed for eye irritation or allergies.    .[name]  Omega-3 1000 MG CAPS Take by mouth.    . ondansetron (ZOFRAN) 4 MG tablet Take 1 tablet (4 mg total) by mouth every 6 (six) hours as needed for nausea or vomiting. 30 tablet 1  . ondansetron (ZOFRAN) 8 MG tablet Take 1 tablet (8 mg total) by mouth 2 (two) times daily as needed (nausea and vomiting). take 30-60 minutes prior to Temodar 30 tablet 1  . ondansetron (ZOFRAN) 8 MG tablet Take 1 tablet (8 mg total) by mouth 2 (two) times daily as needed (nausea and vomiting). take 30-60 minutes prior to Temodar  30 tablet 1  . pantoprazole (PROTONIX) 40 MG tablet Take 1 tablet (40 mg total) by mouth daily. 30 tablet 3  . temozolomide (TEMODAR) 100 MG capsule Take 1 capsule (100 mg total) by mouth daily. Take for 5 days on, 23 d off, repeat every 28d. May take on an empty stomach to decrease N/V 5 capsule 0  . temozolomide (TEMODAR) 100 MG capsule Take 1 capsule (100 mg total) by mouth daily. Take for 5 days on, 23 d off, repeat every 28d. May take on an empty stomach to decrease N/V 5 capsule 0  . temozolomide (TEMODAR) 100 MG capsule Take 1 capsule (100 mg total) by mouth daily. Take for 5 days on, 23 d off, repeat every 28d. May take on an empty stomach to decrease N/V 5 capsule 0  . temozolomide (TEMODAR) 100 MG capsule Take 1 capsule (100 mg total) by mouth daily. Take for 5 days on, 23 d off, repeat every 28d. May take on an empty stomach to decrease N/V 5 capsule 0  . temozolomide (TEMODAR) 140 MG capsule Take 1 capsule (140 mg total) by mouth daily. Take for 5 days on, 23 d off, repeat every 28d. May take on an empty stomach to decrease N/V 5 capsule 0  . temozolomide (TEMODAR) 140 MG capsule Take 1 capsule (140 mg total) by mouth daily. Take for 5 days on, 23 d off, repeat every 28d. May take on an empty stomach to decrease N/V 5 capsule 0  . temozolomide (TEMODAR) 140 MG capsule Take 1 capsule (140 mg total) by mouth daily. Take for 5 days on, 23 d off, repeat every 28d. May take on an empty stomach to decrease N/V 5 capsule 0  . temozolomide (TEMODAR) 140 MG capsule Take 1 capsule (140 mg total) by mouth daily. Take for 5 days on, 23 d off, repeat every 28d. May take on an empty stomach to decrease N/V 5 capsule 0  . temozolomide (TEMODAR) 20 MG capsule Take 1 capsule (20 mg total) by mouth daily. Take for 5 days on, 23 days off, repeat every 28d. May take on an empty stomach to decrease N/V 5 capsule 0  . temozolomide (TEMODAR) 20 MG capsule Take 1 capsule (20 mg total) by mouth daily. Take for 5 days  on, 23 days off, repeat every 28d. May take on an empty stomach to decrease N/V 5 capsule 0  . temozolomide (TEMODAR) 20 MG capsule Take 1 capsule (20 mg total) by mouth daily. Take for 5 days on, 23 days off, repeat every 28d. May take on an empty stomach to decrease N/V 5 capsule 0  . temozolomide (TEMODAR) 20 MG capsule Take 1 capsule (20 mg total) by mouth daily. Take for 5 days on, 23 days off, repeat every 28d. May take on an empty stomach to decrease N/V 5 capsule 0  . zonisamide (ZONEGRAN) 100 MG capsule Take  1 capsule (100 mg total) by mouth daily. 30 capsule 3   No current facility-administered medications on file prior to visit.     Allergies:  Allergies  Allergen Reactions  . Other     Cats, pollen   Past Medical History:  Past Medical History:  Diagnosis Date  . Arthritis   . Brain cancer ([place])    Glioblastoma  . Diabetes mellitus without complication ([place])   . GERD (gastroesophageal reflux disease)   . Hypercholesterolemia   . Hypertension   . Thyroid disease    Past Surgical History:  Past Surgical History:  Procedure Laterality Date  . CRANIOTOMY    . HERNIA REPAIR    . 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 Marital status: Married    Spouse name: Not on file  . Number of children: Not on file  . Years of education: Not on file  . Highest education level: Not on file  Occupational History  . Not on file  Social Needs  . Financial resource strain: Not on file  . Food insecurity    Worry: Not on file    Inability: Not on file  . Transportation needs    Medical: Not on file    Non-medical: Not on file  Tobacco Use  . Smoking status: Former Smoker    Packs/day: 0.25    Years: 3.00    Pack years: 0.75    Types: Cigarettes    Quit date: 03/23/1961    Years since quitting: 57.6  . Smokeless tobacco: Never Used  Substance and Sexual Activity  . Alcohol use: Yes     Alcohol/week: 1.0 standard drinks    Types: 1 Glasses of wine per week    Comment: weekly  . Drug use: Never  . Sexual activity: Not Currently  Lifestyle  . Physical activity    Days per week: Not on file    Minutes per session: Not on file  . Stress: Not on file  Relationships  . Social Herbalist on phone: Not on file    Gets together: Not on file    Attends religious service: Not on file    Active member of club or organization: Not on file    Attends meetings of clubs or organizations: Not on file    Relationship status: Not on file  . Intimate partner violence    Fear of current or ex partner: Not on file    Emotionally abused: Not on file    Physically abused: Not on file    Forced sexual activity: Not on file  Other Topics Concern  . Not on file  Social History Narrative   Married. Resides in [place].   Family History:  Family History  Problem Relation Age of Onset  . Diabetes Mellitus II Mother   . ALS Father     Review of Systems: Constitutional: Denies fevers, chills or abnormal weight loss Eyes: Denies blurriness of vision Ears, nose, mouth, throat, and face: Denies mucositis or sore throat Respiratory: Denies cough, dyspnea or wheezes Cardiovascular: Denies palpitation, chest discomfort or lower extremity swelling Gastrointestinal:  Denies nausea, constipation, diarrhea GU: Denies dysuria or incontinence Skin: Denies abnormal skin rashes Neurological: Per HPI Musculoskeletal: Denies joint pain, back or neck discomfort. No decrease in ROM Behavioral/Psych: Denies anxiety, disturbance in thought content, and mood instability  Physical Exam: Vitals:  11/07/18 1108  BP: 130/67  Pulse: (!) 104  Resp: 18  Temp: 98.2 F (36.8 C)  SpO2: 100%   KPS: 70. General: Alert, cooperative, pleasant, in no acute distress Head: Craniotomy scar noted, dry and intact. EENT: No conjunctival injection or scleral icterus. Oral mucosa moist Lungs:  Resp effort normal Cardiac: Regular rate and rhythm Abdomen: Soft, non-distended abdomen Skin: No rashes cyanosis or petechiae. Extremities: no edema  Neurologic Exam: Mental Status: Awake, alert, attentive to examiner. Oriented to self and environment. Language is fairly densely impaired with regards to fluency, comprehension and repetition.   Cranial Nerves: Visual acuity is grossly normal. Visual fields are full. Extra-ocular movements intact. No ptosis. L UMN facial paresis, tongue midline. Motor: Tone and bulk are normal. Power is 4+/5 in left arm and 4+/5 in leg, with impaired fine motor function in left hand. Reflexes are symmetric, no pathologic reflexes present. Intact finger to nose bilaterally Sensory: Intact to light touch and temperature Gait: Walker assisted  Labs: I have reviewed the data as listed    Component Value Date/Time   NA 136 10/10/2018 1124   K 3.8 10/10/2018 1124   CL 102 10/10/2018 1124   CO2 23 10/10/2018 1124   GLUCOSE 124 (H) 10/10/2018 1124   BUN 24 (H) 10/10/2018 1124   CREATININE 1.06 10/10/2018 1124   CALCIUM 8.9 10/10/2018 1124   PROT 6.4 (L) 10/10/2018 1124   ALBUMIN 3.8 10/10/2018 1124   AST 14 (L) 10/10/2018 1124   ALT 22 10/10/2018 1124   ALKPHOS 43 10/10/2018 1124   BILITOT 0.5 10/10/2018 1124   GFRNONAA >60 10/10/2018 1124   GFRAA >60 10/10/2018 1124   Lab Results  Component Value Date   WBC 5.1 11/07/2018   NEUTROABS 3.8 11/07/2018   HGB 11.1 (L) 11/07/2018   HCT 32.0 (L) 11/07/2018   MCV 100.9 (H) 11/07/2018   PLT 159 11/07/2018     Assessment/Plan 1.  Glioblastoma, IDH-wildtype [facility])   Mr. Reckart is clinically stable today.  Labs are WNL.    We recommended continuing treatment with cycle #5 Temozolomide 144m/m2, on for five days and off for twenty three days in twenty eight day cycles. The patient will have a complete blood count performed on days 21 and 28 of each cycle, and a comprehensive metabolic panel performed on  day 28 of each cycle. Labs may need to be performed more often. Zofran will prescribed for home use for nausea/vomiting.   Chemotherapy should be held for the following:  ANC less than 1,000  Platelets less than 100,000  LFT or creatinine greater than 2x ULN  If clinical concerns/contraindications develop  He should continue Keppra 15035mBID and Zonisamide 10063mID for seizure prevention.  We recommended continuing dexamethasone 1mg52mD given taste/appetite issues with Temodar.  We asked him to return to clinic in 4 weeks with an MRI brain for evaluation.  We will also check TSH, cortisol, Vit B12, Vit D and testosterone to workup his persistent fatigue.  We appreciate the opportunity to participate in the care of EdwaBerton BonThe total time spent in the encounter was 25 minutes and more than 50% was on counseling and review of test results   ZachVentura Sellers Medical Director of Neuro-Oncology [facility] CenterWeslRamirez-Perez17/20 11:07 AM

## 2018-11-08 MED FILL — TEMOZOLOMIDE 140 MG CAPS: 140 | 28 days supply | Qty: 5 | Fill #0

## 2018-11-08 MED FILL — TEMOZOLOMIDE 20 MG CAPS: 20 | 28 days supply | Qty: 5 | Fill #0

## 2018-11-08 MED FILL — TEMOZOLOMIDE 100 MG CAPS: 100 | 28 days supply | Qty: 5 | Fill #0

## 2018-11-09 MED FILL — TEMOZOLOMIDE 20 MG CAPS: 20 | 28 days supply | Qty: 5 | Fill #0

## 2018-11-09 MED FILL — TEMOZOLOMIDE 100 MG CAPS: 100 | 28 days supply | Qty: 5 | Fill #0

## 2018-11-09 MED FILL — TEMOZOLOMIDE 140 MG CAPS: 140 | 28 days supply | Qty: 5 | Fill #0

## 2018-11-11 ENCOUNTER — Other Ambulatory Visit: Payer: Self-pay | Admitting: *Deleted

## 2018-11-11 ENCOUNTER — Telehealth: Payer: Self-pay | Admitting: *Deleted

## 2018-11-11 DIAGNOSIS — C711 Malignant neoplasm of frontal lobe: Secondary | ICD-10-CM

## 2018-11-11 NOTE — Telephone Encounter (Signed)
Bryan [name] notified of MRI scheduled at [facility] on 9/11

## 2018-11-15 ENCOUNTER — Telehealth: Payer: Self-pay

## 2018-11-15 NOTE — Telephone Encounter (Signed)
Received a req from [employer] for documentation and tx plan for O74for January?  It looks like you circled the document and put ?? -- does he still need 02 or should order be discontinued?

## 2018-11-15 NOTE — Telephone Encounter (Signed)
I do remember that form. You'll need to help me with new request tomorrow. Not sure what I can add via epic.

## 2018-11-29 ENCOUNTER — Other Ambulatory Visit: Payer: Self-pay | Admitting: Internal Medicine

## 2018-12-02 ENCOUNTER — Other Ambulatory Visit: Payer: Self-pay

## 2018-12-02 ENCOUNTER — Ambulatory Visit (HOSPITAL_COMMUNITY)
Admission: RE | Admit: 2018-12-02 | Discharge: 2018-12-02 | Disposition: A | Discharge status: Home / Self Care | Payer: Medicare Other | Source: Ambulatory Visit | Attending: Internal Medicine | Admitting: Internal Medicine

## 2018-12-02 ENCOUNTER — Other Ambulatory Visit: Payer: Self-pay | Admitting: Radiation Therapy

## 2018-12-02 DIAGNOSIS — C711 Malignant neoplasm of frontal lobe: Secondary | ICD-10-CM | POA: Diagnosis not present

## 2018-12-02 DIAGNOSIS — C719 Malignant neoplasm of brain, unspecified: Secondary | ICD-10-CM | POA: Diagnosis not present

## 2018-12-02 MED ORDER — GADOBUTROL 1 MMOL/ML IV SOLN
7.0000 mL | Freq: Once | INTRAVENOUS | Status: AC | PRN
  Administered 2018-12-02: 7 mL via INTRAVENOUS

## 2018-12-05 ENCOUNTER — Telehealth: Payer: Self-pay | Admitting: Internal Medicine

## 2018-12-05 ENCOUNTER — Other Ambulatory Visit: Payer: Self-pay

## 2018-12-05 ENCOUNTER — Other Ambulatory Visit: Payer: Self-pay | Admitting: Internal Medicine

## 2018-12-05 ENCOUNTER — Inpatient Hospital Stay: Payer: Medicare Other | Attending: Radiation Oncology

## 2018-12-05 ENCOUNTER — Inpatient Hospital Stay (HOSPITAL_BASED_OUTPATIENT_CLINIC_OR_DEPARTMENT_OTHER): Payer: Medicare Other | Admitting: Internal Medicine

## 2018-12-05 VITALS — BP 138/78 | HR 68 | Temp 98.9°F | Resp 17 | Ht 64.5 in | Wt 150.2 lb

## 2018-12-05 DIAGNOSIS — Z79899 Other long term (current) drug therapy: Secondary | ICD-10-CM | POA: Diagnosis not present

## 2018-12-05 DIAGNOSIS — Z923 Personal history of irradiation: Secondary | ICD-10-CM | POA: Insufficient documentation

## 2018-12-05 DIAGNOSIS — K219 Gastro-esophageal reflux disease without esophagitis: Secondary | ICD-10-CM | POA: Insufficient documentation

## 2018-12-05 DIAGNOSIS — R6889 Other general symptoms and signs: Secondary | ICD-10-CM

## 2018-12-05 DIAGNOSIS — I10 Essential (primary) hypertension: Secondary | ICD-10-CM | POA: Diagnosis not present

## 2018-12-05 DIAGNOSIS — Z7984 Long term (current) use of oral hypoglycemic drugs: Secondary | ICD-10-CM | POA: Diagnosis not present

## 2018-12-05 DIAGNOSIS — C711 Malignant neoplasm of frontal lobe: Secondary | ICD-10-CM | POA: Insufficient documentation

## 2018-12-05 DIAGNOSIS — R2689 Other abnormalities of gait and mobility: Secondary | ICD-10-CM | POA: Diagnosis not present

## 2018-12-05 DIAGNOSIS — R5383 Other fatigue: Secondary | ICD-10-CM

## 2018-12-05 DIAGNOSIS — E7800 Pure hypercholesterolemia, unspecified: Secondary | ICD-10-CM | POA: Diagnosis not present

## 2018-12-05 DIAGNOSIS — E119 Type 2 diabetes mellitus without complications: Secondary | ICD-10-CM | POA: Insufficient documentation

## 2018-12-05 DIAGNOSIS — Z9221 Personal history of antineoplastic chemotherapy: Secondary | ICD-10-CM | POA: Insufficient documentation

## 2018-12-05 DIAGNOSIS — Z87891 Personal history of nicotine dependence: Secondary | ICD-10-CM | POA: Insufficient documentation

## 2018-12-05 DIAGNOSIS — Z85828 Personal history of other malignant neoplasm of skin: Secondary | ICD-10-CM | POA: Insufficient documentation

## 2018-12-05 DIAGNOSIS — E559 Vitamin D deficiency, unspecified: Secondary | ICD-10-CM

## 2018-12-05 LAB — CMP (CANCER CENTER ONLY)
ALT: 15 U/L (ref 0–44)
AST: 11 U/L — ABNORMAL LOW (ref 15–41)
Albumin: 4 g/dL (ref 3.5–5.0)
Alkaline Phosphatase: 39 U/L (ref 38–126)
Anion gap: 8 (ref 5–15)
BUN: 16 mg/dL (ref 8–23)
CO2: 24 mmol/L (ref 22–32)
Calcium: 9.1 mg/dL (ref 8.9–10.3)
Chloride: 108 mmol/L (ref 98–111)
Creatinine: 1.12 mg/dL (ref 0.61–1.24)
GFR, Est AFR Am: >60 mL/min (ref >60)
GFR, Estimated: >60 mL/min (ref >60)
Glucose, Bld: 164 mg/dL — ABNORMAL HIGH (ref 70–99)
Potassium: 3.8 mmol/L (ref 3.5–5.1)
Sodium: 140 mmol/L (ref 135–145)
Total Bilirubin: 0.3 mg/dL (ref 0.3–1.2)
Total Protein: 6.2 g/dL — ABNORMAL LOW (ref 6.5–8.1)

## 2018-12-05 LAB — CBC WITH DIFFERENTIAL (CANCER CENTER ONLY)
Abs Immature Granulocytes: 0.07 10*3/uL (ref 0.00–0.07)
Basophils Absolute: 0 10*3/uL (ref 0.0–0.1)
Basophils Relative: 0 %
Eosinophils Absolute: 0 10*3/uL (ref 0.0–0.5)
Eosinophils Relative: 1 %
HCT: 31.4 % — ABNORMAL LOW (ref 39.0–52.0)
Hemoglobin: 11 g/dL — ABNORMAL LOW (ref 13.0–17.0)
Immature Granulocytes: 1 %
Lymphocytes Relative: 12 %
Lymphs Abs: 0.6 10*3/uL — ABNORMAL LOW (ref 0.7–4.0)
MCH: 35.6 pg — ABNORMAL HIGH (ref 26.0–34.0)
MCHC: 35 g/dL (ref 30.0–36.0)
MCV: 101.6 fL — ABNORMAL HIGH (ref 80.0–100.0)
Monocytes Absolute: 0.5 10*3/uL (ref 0.1–1.0)
Monocytes Relative: 10 %
Neutro Abs: 3.7 10*3/uL (ref 1.7–7.7)
Neutrophils Relative %: 76 %
Platelet Count: 169 10*3/uL (ref 150–400)
RBC: 3.09 MIL/uL — ABNORMAL LOW (ref 4.22–5.81)
RDW: 12.9 % (ref 11.5–15.5)
WBC Count: 4.9 10*3/uL (ref 4.0–10.5)
nRBC: 0 % (ref 0.0–0.2)

## 2018-12-05 LAB — VITAMIN B12: Vitamin B-12: 124 pg/mL — ABNORMAL LOW (ref 180–914)

## 2018-12-05 LAB — CORTISOL: Cortisol, Plasma: 0.8 ug/dL

## 2018-12-05 LAB — TSH: TSH: 1.395 u[IU]/mL (ref 0.320–4.118)

## 2018-12-05 MED ORDER — TEMOZOLOMIDE 20 MG PO CAPS: 20.0000 mg | ORAL_CAPSULE | Freq: Every day | ORAL | 0 refills | Status: DC

## 2018-12-05 MED ORDER — TEMOZOLOMIDE 100 MG PO CAPS: 100.0000 mg | ORAL_CAPSULE | Freq: Every day | ORAL | 0 refills | Status: DC

## 2018-12-05 MED ORDER — TEMOZOLOMIDE 140 MG PO CAPS: 140.0000 mg | ORAL_CAPSULE | Freq: Every day | ORAL | 0 refills | Status: DC

## 2018-12-05 MED ORDER — ZONISAMIDE 100 MG PO CAPS: 100.0000 mg | ORAL_CAPSULE | Freq: Every day | ORAL | 1 refills | Status: DC

## 2018-12-05 MED ORDER — DEXAMETHASONE 1 MG PO TABS: ORAL_TABLET | ORAL | 0 refills | Status: DC

## 2018-12-05 MED ORDER — METHYLPHENIDATE HCL 5 MG PO TABS: 5.0000 mg | ORAL_TABLET | Freq: Two times a day (BID) | ORAL | 0 refills | Status: DC

## 2018-12-05 MED FILL — METHYLPHENIDATE HCL 5 MG TA: 5 | 30 days supply | Qty: 60 | Fill #0

## 2018-12-05 NOTE — Telephone Encounter (Signed)
Scheduled appt per 9/14 los.  Spoke with patient wife and she is aware of the appt date and time.

## 2018-12-05 NOTE — Progress Notes (Signed)
[facility] [place], [place] [address] [phone]   Interval Evaluation  Date of Service: 12/05/18 Patient Name: Bryan [name] Patient MRN: [id number] Patient DOB: 03-05-1941 Provider: Ventura Sellers, MD  Identifying Statement:  Bryan [name] is a 78 y.o. male with right frontal glioblastoma   Oncologic History: Oncology History   Glioblastoma, IDH-wildtype ([place])   03/07/2018 Surgery   Craniotomy, resection by Dr. Tommi Rumps ([facility]).  Path is Glioblastoma   03/29/2018 - 04/04/2018 Chemotherapy   The patient had [No matching medication found in this treatment plan]  for chemotherapy treatment.    04/04/2018 - 05/13/2018 Radiation Therapy   IMRT and concurrent Temozolomide with Dr. Tammi Klippel   06/09/2018 -  Chemotherapy   The patient had [No matching medication found in this treatment plan]  for chemotherapy treatment.      Biomarkers:  MGMT Unknown.  IDH 1/2 Wild type.  EGFR Expressed  TERT Unknown   Interval History: Bryan [name] presents today for follow up after completing cycle #5 TMZ.  Family describes stability of gait, with continued occassional dragging of the left leg. Energy is still fairly low, frequent napping.  He is still up much of the night.  Continues to take decadron 85m daily. Speaking/fluency is stable.  History is overall still limited by language impairments, wife assisted again with history.    H+P (02/16/18) [name]89y.o. male presented after family noted drooping of the left side of his face yesterday.  In addition, he/they describe some cognitive changes, such as difficulty "finding words" in certain situations.  The patient also feels like his left leg is "lagging" a little behind his right lately.  He is walking on his own, however, and with no falls.  Denies headaches, seizures.   Medications: Current Outpatient Medications on File Prior to Visit  Medication Sig Dispense Refill  .  acetaminophen (TYLENOL) 325 MG tablet Take 2 tablets (650 mg total) by mouth every 6 (six) hours as needed for mild pain.    .Marland Kitchenatorvastatin (LIPITOR) 40 MG tablet Take 1 tablet (40 mg total) by mouth daily. 30 tablet 0  . dexamethasone (DECADRON) 1 MG tablet TAKE 1 TABLET (1 MG TOTAL) BY MOUTH 2 TIMES DAILY. 30 tablet 0  . Flaxseed, Linseed, (FLAXSEED OIL PO) Take by mouth.    . fluticasone (FLONASE) 50 MCG/ACT nasal spray Place 1 spray into both nostrils 2 (two) times daily.    . Ginkgo Biloba Extract (GINKGO BILOBA MEMORY ENHANCER) 60 MG CAPS Take by mouth.    . levETIRAcetam (KEPPRA) 100 MG/ML solution Take 15 mLs (1,500 mg total) by mouth 2 (two) times daily. 473 mL 12  . levothyroxine (SYNTHROID, LEVOTHROID) 75 MCG tablet Take 1 tablet (75 mcg total) by mouth daily before breakfast. 30 tablet 0  . lisinopril (PRINIVIL,ZESTRIL) 5 MG tablet Take 3 tablets (15 mg total) by mouth daily. 30 tablet 1  . metFORMIN (GLUCOPHAGE) 1000 MG tablet Take 1 tablet (1,000 mg total) by mouth 2 (two) times daily with a meal. 60 tablet 1  . Multiple Vitamins-Minerals (MULTI-DAY PLUS MINERALS) TABS Take 1 tablet by mouth daily.    . naphazoline-pheniramine (NAPHCON-A) 0.025-0.3 % ophthalmic solution Place 1 drop into both eyes as needed for eye irritation or allergies.    . Omega-3 1000 MG CAPS Take by mouth.    . ondansetron (ZOFRAN) 4 MG tablet Take 1 tablet (4 mg total) by mouth every 6 (six) hours as needed  for nausea or vomiting. 30 tablet 1  . ondansetron (ZOFRAN) 8 MG tablet Take 1 tablet (8 mg total) by mouth 2 (two) times daily as needed (nausea and vomiting). take 30-60 minutes prior to Temodar 30 tablet 1  . ondansetron (ZOFRAN) 8 MG tablet Take 1 tablet (8 mg total) by mouth 2 (two) times daily as needed (nausea and vomiting). take 30-60 minutes prior to Temodar 30 tablet 1  . pantoprazole (PROTONIX) 40 MG tablet Take 1 tablet (40 mg total) by mouth daily. 30 tablet 3  . temozolomide (TEMODAR) 100 MG  capsule Take 1 capsule (100 mg total) by mouth daily. Take for 5 days on, 23 d off, repeat every 28d. May take on an empty stomach to decrease N/V (Patient not taking: Reported on 11/07/2018) 5 capsule 0  . temozolomide (TEMODAR) 100 MG capsule Take 1 capsule (100 mg total) by mouth daily. Take for 5 days on, 23 d off, repeat every 28d. May take on an empty stomach to decrease N/V 5 capsule 0  . temozolomide (TEMODAR) 100 MG capsule Take 1 capsule (100 mg total) by mouth daily. Take for 5 days on, 23 d off, repeat every 28d. May take on an empty stomach to decrease N/V (Patient not taking: Reported on 11/07/2018) 5 capsule 0  . temozolomide (TEMODAR) 100 MG capsule Take 1 capsule (100 mg total) by mouth daily. Take for 5 days on, 23 d off, repeat every 28d. May take on an empty stomach to decrease N/V (Patient not taking: Reported on 11/07/2018) 5 capsule 0  . temozolomide (TEMODAR) 100 MG capsule Take 1 capsule (100 mg total) by mouth daily. Take for 5 days on, 23 d off, repeat every 28d. May take on an empty stomach to decrease N/V 5 capsule 0  . temozolomide (TEMODAR) 140 MG capsule Take 1 capsule (140 mg total) by mouth daily. Take for 5 days on, 23 d off, repeat every 28d. May take on an empty stomach to decrease N/V (Patient not taking: Reported on 11/07/2018) 5 capsule 0  . temozolomide (TEMODAR) 140 MG capsule Take 1 capsule (140 mg total) by mouth daily. Take for 5 days on, 23 d off, repeat every 28d. May take on an empty stomach to decrease N/V (Patient not taking: Reported on 11/07/2018) 5 capsule 0  . temozolomide (TEMODAR) 140 MG capsule Take 1 capsule (140 mg total) by mouth daily. Take for 5 days on, 23 d off, repeat every 28d. May take on an empty stomach to decrease N/V (Patient not taking: Reported on 11/07/2018) 5 capsule 0  . temozolomide (TEMODAR) 140 MG capsule Take 1 capsule (140 mg total) by mouth daily. Take for 5 days on, 23 d off, repeat every 28d. May take on an empty stomach to  decrease N/V 5 capsule 0  . temozolomide (TEMODAR) 140 MG capsule Take 1 capsule (140 mg total) by mouth daily. Take for 5 days on, 23 d off, repeat every 28d. May take on an empty stomach to decrease N/V 5 capsule 0  . temozolomide (TEMODAR) 20 MG capsule Take 1 capsule (20 mg total) by mouth daily. Take for 5 days on, 23 days off, repeat every 28d. May take on an empty stomach to decrease N/V 5 capsule 0  . temozolomide (TEMODAR) 20 MG capsule Take 1 capsule (20 mg total) by mouth daily. Take for 5 days on, 23 days off, repeat every 28d. May take on an empty stomach to decrease N/V 5 capsule 0  .  temozolomide (TEMODAR) 20 MG capsule Take 1 capsule (20 mg total) by mouth daily. Take for 5 days on, 23 days off, repeat every 28d. May take on an empty stomach to decrease N/V 5 capsule 0  . temozolomide (TEMODAR) 20 MG capsule Take 1 capsule (20 mg total) by mouth daily. Take for 5 days on, 23 days off, repeat every 28d. May take on an empty stomach to decrease N/V 5 capsule 0  . temozolomide (TEMODAR) 20 MG capsule Take 1 capsule (20 mg total) by mouth daily. Take for 5 days on, 23 days off, repeat every 28d. May take on an empty stomach to decrease N/V 5 capsule 0  . zonisamide (ZONEGRAN) 100 MG capsule TAKE 1 CAPSULE EVERY DAY 90 capsule 1   No current facility-administered medications on file prior to visit.     Allergies:  Allergies  Allergen Reactions  . Other     Cats, pollen   Past Medical History:  Past Medical History:  Diagnosis Date  . Arthritis   . Brain cancer ([facility])    Glioblastoma  . Diabetes mellitus without complication ([place])   . GERD (gastroesophageal reflux disease)   . Hypercholesterolemia   . Hypertension   . Thyroid disease    Past Surgical History:  Past Surgical History:  Procedure Laterality Date  . CRANIOTOMY    . HERNIA REPAIR    . 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 Marital status: Married    Spouse name: Not on file  . Number of children: Not on file  . Years of education: Not on file  . Highest education level: Not on file  Occupational History  . Not on file  Social Needs  . Financial resource strain: Not on file  . Food insecurity    Worry: Not on file    Inability: Not on file  . Transportation needs    Medical: Not on file    Non-medical: Not on file  Tobacco Use  . Smoking status: Former Smoker    Packs/day: 0.25    Years: 3.00    Pack years: 0.75    Types: Cigarettes    Quit date: 03/23/1961    Years since quitting: 57.7  . Smokeless tobacco: Never Used  Substance and Sexual Activity  . Alcohol use: Yes    Alcohol/week: 1.0 standard drinks    Types: 1 Glasses of wine per week    Comment: weekly  . Drug use: Never  . Sexual activity: Not Currently  Lifestyle  . Physical activity    Days per week: Not on file    Minutes per session: Not on file  . Stress: Not on file  Relationships  . Social Herbalist on phone: Not on file    Gets together: Not on file    Attends religious service: Not on file    Active member of club or organization: Not on file    Attends meetings of clubs or organizations: Not on file    Relationship status: Not on file  . Intimate partner violence    Fear of current or ex partner: Not on file    Emotionally abused: Not on file    Physically abused: Not on file    Forced sexual activity: Not on file  Other Topics Concern  . Not on file  Social History Narrative   Married.  Resides in [place].   Family History:  Family History  Problem Relation Age of Onset  . Diabetes Mellitus II Mother   . ALS Father     Review of Systems: Constitutional: Denies fevers, chills or abnormal weight loss Eyes: Denies blurriness of vision Ears, nose, mouth, throat, and face: Denies mucositis or sore throat Respiratory: Denies cough, dyspnea or wheezes Cardiovascular: Denies palpitation, chest  discomfort or lower extremity swelling Gastrointestinal:  Denies nausea, constipation, diarrhea GU: Denies dysuria or incontinence Skin: Denies abnormal skin rashes Neurological: Per HPI Musculoskeletal: Denies joint pain, back or neck discomfort. No decrease in ROM Behavioral/Psych: Denies anxiety, disturbance in thought content, and mood instability  Physical Exam: Vitals:   12/05/18 1111  BP: 138/78  Pulse: 68  Resp: 17  Temp: 98.9 F (37.2 C)  SpO2: 99%   KPS: 70. General: Alert, cooperative, pleasant, in no acute distress Head: Craniotomy scar noted, dry and intact. EENT: No conjunctival injection or scleral icterus. Oral mucosa moist Lungs: Resp effort normal Cardiac: Regular rate and rhythm Abdomen: Soft, non-distended abdomen Skin: No rashes cyanosis or petechiae. Extremities: no edema  Neurologic Exam: Mental Status: Awake, alert, attentive to examiner. Oriented to self and environment. Language is fairly densely impaired with regards to fluency, comprehension and repetition.   Cranial Nerves: Visual acuity is grossly normal. Visual fields are full. Extra-ocular movements intact. No ptosis. L UMN facial paresis, tongue midline. Motor: Tone and bulk are normal. Power is 4+/5 in left arm and 4+/5 in leg, with impaired fine motor function in left hand. Reflexes are symmetric, no pathologic reflexes present. Intact finger to nose bilaterally Sensory: Intact to light touch and temperature Gait: Walker assisted  Labs: I have reviewed the data as listed    Component Value Date/Time   NA 140 11/07/2018 1040   K 4.3 11/07/2018 1040   CL 105 11/07/2018 1040   CO2 23 11/07/2018 1040   GLUCOSE 181 (H) 11/07/2018 1040   BUN 23 11/07/2018 1040   CREATININE 1.09 11/07/2018 1040   CALCIUM 9.5 11/07/2018 1040   PROT 6.6 11/07/2018 1040   ALBUMIN 4.1 11/07/2018 1040   AST 14 (L) 11/07/2018 1040   ALT 22 11/07/2018 1040   ALKPHOS 39 11/07/2018 1040   BILITOT 0.4 11/07/2018  1040   GFRNONAA >60 11/07/2018 1040   GFRAA >60 11/07/2018 1040   Lab Results  Component Value Date   WBC 4.9 12/05/2018   NEUTROABS 3.7 12/05/2018   HGB 11.0 (L) 12/05/2018   HCT 31.4 (L) 12/05/2018   MCV 101.6 (H) 12/05/2018   PLT 169 12/05/2018    Imaging:  [facility] Clinician Interpretation: I have personally reviewed the CNS images as listed.  My interpretation, in the context of the patient's clinical presentation, is stable disease  Mr Jeri Cos Wo Contrast  Result Date: 12/03/2018 CLINICAL DATA:  Follow-up glioblastoma EXAM: MRI HEAD WITHOUT AND WITH CONTRAST TECHNIQUE: Multiplanar, multiecho pulse sequences of the brain and surrounding structures were obtained without and with intravenous contrast. CONTRAST:  69m GADAVIST GADOBUTROL 1 MMOL/ML IV SOLN COMPARISON:  10/07/2018 FINDINGS: Brain: Area of mainly peripheral enhancement centered at the right insula is unchanged in shape and extent with 23 mm craniocaudal span. Regional white matter FLAIR and T2 hyperintensity with mild gyral thickening is unchanged. No incidental infarct, acute hemorrhage, or hydrocephalus. Blood products and altered diffusion about the mass, with wallerian changes seen into the right brainstem, are unchanged. 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 Electronically Signed   By: Monte Fantasia M.D.   On: 12/03/2018 07:53    Assessment/Plan 1.  Glioblastoma, IDH-wildtype [facility])   Mr. Mochizuki is clinically and radiographically stable today.  Labs are WNL.    We recommended continuing treatment with cycle #6 Temozolomide 164m/m2, on for five days and off for twenty three days in twenty eight day cycles. The patient will have a complete blood  count performed on days 21 and 28 of each cycle, and a comprehensive metabolic panel performed on day 28 of each cycle. Labs may need to be performed more often. Zofran will prescribed for home use for nausea/vomiting.   Chemotherapy should be held for the following:  ANC less than 1,000  Platelets less than 100,000  LFT or creatinine greater than 2x ULN  If clinical concerns/contraindications develop  He should continue Keppra 15044mBID and Zonisamide 10077mID for seizure prevention.  We recommended continuing dexamethasone 1mg30mD given taste/appetite issues with Temodar.  We asked him to return to clinic in 1 months with labs for evaluation.    We appreciate the opportunity to participate in the care of [facility]The total time spent in the encounter was 40 minutes and more than 50% was on counseling and review of test results   ZachVentura Sellers Medical Director of Neuro-Oncology [facility]14/20 11:06 AM

## 2018-12-06 LAB — TESTOSTERONE: Testosterone: 297 ng/dL (ref 264–916)

## 2018-12-06 LAB — VITAMIN D 25 HYDROXY (VIT D DEFICIENCY, FRACTURES): Vit D, 25-Hydroxy: 21.8 ng/mL — ABNORMAL LOW (ref 30.0–100.0)

## 2018-12-09 MED FILL — TEMOZOLOMIDE 140 MG CAPS: 140 | 5 days supply | Qty: 5 | Fill #0

## 2018-12-09 MED FILL — TEMOZOLOMIDE 20 MG CAPS: 20 | 28 days supply | Qty: 5 | Fill #0

## 2018-12-09 MED FILL — TEMOZOLOMIDE 100 MG CAPS: 100 | 28 days supply | Qty: 5 | Fill #0

## 2018-12-12 DIAGNOSIS — C719 Malignant neoplasm of brain, unspecified: Secondary | ICD-10-CM | POA: Diagnosis not present

## 2018-12-12 DIAGNOSIS — N210 Calculus in bladder: Secondary | ICD-10-CM | POA: Diagnosis not present

## 2018-12-12 DIAGNOSIS — D6481 Anemia due to antineoplastic chemotherapy: Secondary | ICD-10-CM | POA: Diagnosis not present

## 2018-12-12 DIAGNOSIS — E538 Deficiency of other specified B group vitamins: Secondary | ICD-10-CM | POA: Diagnosis not present

## 2018-12-12 DIAGNOSIS — G8190 Hemiplegia, unspecified affecting unspecified side: Secondary | ICD-10-CM | POA: Diagnosis not present

## 2018-12-12 DIAGNOSIS — E039 Hypothyroidism, unspecified: Secondary | ICD-10-CM | POA: Diagnosis not present

## 2018-12-12 DIAGNOSIS — E785 Hyperlipidemia, unspecified: Secondary | ICD-10-CM | POA: Diagnosis not present

## 2018-12-12 DIAGNOSIS — R7301 Impaired fasting glucose: Secondary | ICD-10-CM | POA: Diagnosis not present

## 2018-12-12 DIAGNOSIS — E559 Vitamin D deficiency, unspecified: Secondary | ICD-10-CM | POA: Diagnosis not present

## 2018-12-12 DIAGNOSIS — I2510 Atherosclerotic heart disease of native coronary artery without angina pectoris: Secondary | ICD-10-CM | POA: Diagnosis not present

## 2018-12-12 DIAGNOSIS — N401 Enlarged prostate with lower urinary tract symptoms: Secondary | ICD-10-CM | POA: Diagnosis not present

## 2018-12-12 DIAGNOSIS — I10 Essential (primary) hypertension: Secondary | ICD-10-CM | POA: Diagnosis not present

## 2018-12-25 ENCOUNTER — Other Ambulatory Visit: Payer: Self-pay | Admitting: Internal Medicine

## 2019-01-02 ENCOUNTER — Other Ambulatory Visit: Payer: Self-pay

## 2019-01-02 ENCOUNTER — Inpatient Hospital Stay (HOSPITAL_BASED_OUTPATIENT_CLINIC_OR_DEPARTMENT_OTHER): Payer: Medicare Other | Admitting: Internal Medicine

## 2019-01-02 ENCOUNTER — Inpatient Hospital Stay: Payer: Medicare Other | Attending: Radiation Oncology

## 2019-01-02 VITALS — BP 143/69 | HR 118 | Temp 98.2°F | Resp 18 | Ht 64.5 in | Wt 150.2 lb

## 2019-01-02 DIAGNOSIS — K219 Gastro-esophageal reflux disease without esophagitis: Secondary | ICD-10-CM | POA: Diagnosis not present

## 2019-01-02 DIAGNOSIS — I10 Essential (primary) hypertension: Secondary | ICD-10-CM | POA: Insufficient documentation

## 2019-01-02 DIAGNOSIS — Z7984 Long term (current) use of oral hypoglycemic drugs: Secondary | ICD-10-CM | POA: Insufficient documentation

## 2019-01-02 DIAGNOSIS — Z923 Personal history of irradiation: Secondary | ICD-10-CM | POA: Diagnosis not present

## 2019-01-02 DIAGNOSIS — M1990 Unspecified osteoarthritis, unspecified site: Secondary | ICD-10-CM | POA: Insufficient documentation

## 2019-01-02 DIAGNOSIS — Z23 Encounter for immunization: Secondary | ICD-10-CM

## 2019-01-02 DIAGNOSIS — E079 Disorder of thyroid, unspecified: Secondary | ICD-10-CM | POA: Diagnosis not present

## 2019-01-02 DIAGNOSIS — Z85828 Personal history of other malignant neoplasm of skin: Secondary | ICD-10-CM | POA: Diagnosis not present

## 2019-01-02 DIAGNOSIS — C711 Malignant neoplasm of frontal lobe: Secondary | ICD-10-CM

## 2019-01-02 DIAGNOSIS — R269 Unspecified abnormalities of gait and mobility: Secondary | ICD-10-CM | POA: Diagnosis not present

## 2019-01-02 DIAGNOSIS — Z87891 Personal history of nicotine dependence: Secondary | ICD-10-CM | POA: Insufficient documentation

## 2019-01-02 DIAGNOSIS — Z79899 Other long term (current) drug therapy: Secondary | ICD-10-CM | POA: Insufficient documentation

## 2019-01-02 DIAGNOSIS — E119 Type 2 diabetes mellitus without complications: Secondary | ICD-10-CM | POA: Insufficient documentation

## 2019-01-02 DIAGNOSIS — Z9221 Personal history of antineoplastic chemotherapy: Secondary | ICD-10-CM | POA: Insufficient documentation

## 2019-01-02 DIAGNOSIS — E7800 Pure hypercholesterolemia, unspecified: Secondary | ICD-10-CM | POA: Insufficient documentation

## 2019-01-02 LAB — CMP (CANCER CENTER ONLY)
ALT: 17 U/L (ref 0–44)
AST: 12 U/L — ABNORMAL LOW (ref 15–41)
Albumin: 3.9 g/dL (ref 3.5–5.0)
Alkaline Phosphatase: 35 U/L — ABNORMAL LOW (ref 38–126)
Anion gap: 11 (ref 5–15)
BUN: 21 mg/dL (ref 8–23)
CO2: 24 mmol/L (ref 22–32)
Calcium: 9.2 mg/dL (ref 8.9–10.3)
Chloride: 104 mmol/L (ref 98–111)
Creatinine: 1.19 mg/dL (ref 0.61–1.24)
GFR, Est AFR Am: >60 mL/min (ref >60)
GFR, Estimated: 59 mL/min — ABNORMAL LOW (ref >60)
Glucose, Bld: 184 mg/dL — ABNORMAL HIGH (ref 70–99)
Potassium: 3.9 mmol/L (ref 3.5–5.1)
Sodium: 139 mmol/L (ref 135–145)
Total Bilirubin: 0.3 mg/dL (ref 0.3–1.2)
Total Protein: 6.3 g/dL — ABNORMAL LOW (ref 6.5–8.1)

## 2019-01-02 LAB — CBC WITH DIFFERENTIAL (CANCER CENTER ONLY)
Abs Immature Granulocytes: 0.06 10*3/uL (ref 0.00–0.07)
Basophils Absolute: 0 10*3/uL (ref 0.0–0.1)
Basophils Relative: 0 %
Eosinophils Absolute: 0 10*3/uL (ref 0.0–0.5)
Eosinophils Relative: 0 %
HCT: 32.5 % — ABNORMAL LOW (ref 39.0–52.0)
Hemoglobin: 11.1 g/dL — ABNORMAL LOW (ref 13.0–17.0)
Immature Granulocytes: 1 %
Lymphocytes Relative: 13 %
Lymphs Abs: 0.6 10*3/uL — ABNORMAL LOW (ref 0.7–4.0)
MCH: 34.9 pg — ABNORMAL HIGH (ref 26.0–34.0)
MCHC: 34.2 g/dL (ref 30.0–36.0)
MCV: 102.2 fL — ABNORMAL HIGH (ref 80.0–100.0)
Monocytes Absolute: 0.4 10*3/uL (ref 0.1–1.0)
Monocytes Relative: 8 %
Neutro Abs: 3.6 10*3/uL (ref 1.7–7.7)
Neutrophils Relative %: 78 %
Platelet Count: 156 10*3/uL (ref 150–400)
RBC: 3.18 MIL/uL — ABNORMAL LOW (ref 4.22–5.81)
RDW: 12.4 % (ref 11.5–15.5)
WBC Count: 4.7 10*3/uL (ref 4.0–10.5)
nRBC: 0 % (ref 0.0–0.2)

## 2019-01-02 MED ORDER — INFLUENZA VAC A&B SA ADJ QUAD 0.5 ML IM PRSY
0.5000 mL | PREFILLED_SYRINGE | Freq: Once | INTRAMUSCULAR | Status: AC
  Administered 2019-01-02: 0.5 mL via INTRAMUSCULAR

## 2019-01-02 MED ORDER — METHYLPHENIDATE HCL 5 MG PO TABS: 5.0000 mg | ORAL_TABLET | Freq: Two times a day (BID) | ORAL | 0 refills | Status: DC

## 2019-01-02 MED ORDER — INFLUENZA VAC A&B SA ADJ QUAD 0.5 ML IM PRSY
PREFILLED_SYRINGE | INTRAMUSCULAR | Status: AC
  Filled 2019-01-02: qty 0.5

## 2019-01-02 NOTE — Progress Notes (Signed)
[facility] [place], [place] [address] [phone]   Interval Evaluation  Date of Service: 01/02/19 Patient Name: Bryan [name] Patient MRN: [id number] Patient DOB: [birth year] Provider: Ventura Sellers, MD  Identifying Statement:  Bryan [name] is a 78 y.o. male with right frontal glioblastoma   Oncologic History: Oncology History   Glioblastoma, IDH-wildtype ([facility])   03/07/2018 Surgery   Craniotomy, resection by Dr. Tommi Rumps ([facility]).  Path is Glioblastoma   03/29/2018 - 04/04/2018 Chemotherapy   The patient had [No matching medication found in this treatment plan]  for chemotherapy treatment.    04/04/2018 - 05/13/2018 Radiation Therapy   IMRT and concurrent Temozolomide with Dr. Tammi Klippel   06/09/2018 -  Chemotherapy   The patient had [No matching medication found in this treatment plan]  for chemotherapy treatment.      Biomarkers:  MGMT Unknown.  IDH 1/2 Wild type.  EGFR Expressed  TERT Unknown   Interval History: Castiel Lauricella presents today for follow up after completing cycle #6 TMZ.  He describes improved energy and decreased sleep volume since starting the stimulant.  Family describes stability of gait, with continued continued dragging of the left leg. Continues to take decadron 71m twice per day. Speaking/fluency is stable.  History is overall still limited by language impairments, wife assisted again with history.    H+P (02/16/18) Bryan Bon89y.o. male presented after family noted drooping of the left side of his face yesterday.  In addition, he/they describe some cognitive changes, such as difficulty "finding words" in certain situations.  The patient also feels like his left leg is "lagging" a little behind his right lately.  He is walking on his own, however, and with no falls.  Denies headaches, seizures.   Medications: Current Outpatient Medications on File Prior to Visit  Medication Sig Dispense  Refill  . acetaminophen (TYLENOL) 325 MG tablet Take 2 tablets (650 mg total) by mouth every 6 (six) hours as needed for mild pain.    .Marland Kitchenatorvastatin (LIPITOR) 40 MG tablet Take 1 tablet (40 mg total) by mouth daily. 30 tablet 0  . Cholecalciferol (D-3-5) 125 MCG (5000 UT) capsule Take 1 capsule by mouth daily.    . Cyanocobalamin (B-12) 1000 MCG LOZG Take 2 lozenges by mouth daily.    .Marland Kitchendexamethasone (DECADRON) 1 MG tablet TAKE 1 TABLET TWICE DAILY 60 tablet 1  . fluticasone (FLONASE) 50 MCG/ACT nasal spray Place 1 spray into both nostrils 2 (two) times daily.    .Marland KitchenlevETIRAcetam (KEPPRA) 100 MG/ML solution Take 15 mLs (1,500 mg total) by mouth 2 (two) times daily. 473 mL 12  . levothyroxine (SYNTHROID, LEVOTHROID) 75 MCG tablet Take 1 tablet (75 mcg total) by mouth daily before breakfast. 30 tablet 0  . lisinopril (ZESTRIL) 10 MG tablet Take 1 tablet by mouth daily.    . metFORMIN (GLUCOPHAGE) 1000 MG tablet Take 1 tablet (1,000 mg total) by mouth 2 (two) times daily with a meal. 60 tablet 1  . methylphenidate (RITALIN) 5 MG tablet Take 1 tablet (5 mg total) by mouth 2 (two) times daily with breakfast and lunch. 60 tablet 0  . naphazoline-pheniramine (NAPHCON-A) 0.025-0.3 % ophthalmic solution Place 1 drop into both eyes as needed for eye irritation or allergies.    . pantoprazole (PROTONIX) 40 MG tablet TAKE 1 TABLET EVERY DAY 90 tablet 3  . zonisamide (ZONEGRAN) 100 MG capsule Take 1 capsule (100 mg total) by mouth daily.  90 capsule 1  . Flaxseed, Linseed, (FLAXSEED OIL PO) Take by mouth.    . Ginkgo Biloba Extract (GINKGO BILOBA MEMORY ENHANCER) 60 MG CAPS Take by mouth.    . Multiple Vitamins-Minerals (MULTI-DAY PLUS MINERALS) TABS Take 1 tablet by mouth daily.    . Omega-3 1000 MG CAPS Take by mouth.    . ondansetron (ZOFRAN) 4 MG tablet Take 1 tablet (4 mg total) by mouth every 6 (six) hours as needed for nausea or vomiting. 30 tablet 1  . ondansetron (ZOFRAN) 8 MG tablet Take 1 tablet (8  mg total) by mouth 2 (two) times daily as needed (nausea and vomiting). take 30-60 minutes prior to Temodar (Patient not taking: Reported on 01/02/2019) 30 tablet 1  . ondansetron (ZOFRAN) 8 MG tablet Take 1 tablet (8 mg total) by mouth 2 (two) times daily as needed (nausea and vomiting). take 30-60 minutes prior to Temodar (Patient not taking: Reported on 01/02/2019) 30 tablet 1  . temozolomide (TEMODAR) 100 MG capsule Take 1 capsule (100 mg total) by mouth daily. Take for 5 days on, 23 d off, repeat every 28d. May take on an empty stomach to decrease N/V (Patient not taking: Reported on 11/07/2018) 5 capsule 0  . temozolomide (TEMODAR) 100 MG capsule Take 1 capsule (100 mg total) by mouth daily. Take for 5 days on, 23 d off, repeat every 28d. May take on an empty stomach to decrease N/V 5 capsule 0  . temozolomide (TEMODAR) 100 MG capsule Take 1 capsule (100 mg total) by mouth daily. Take for 5 days on, 23 d off, repeat every 28d. May take on an empty stomach to decrease N/V (Patient not taking: Reported on 11/07/2018) 5 capsule 0  . temozolomide (TEMODAR) 100 MG capsule Take 1 capsule (100 mg total) by mouth daily. Take for 5 days on, 23 d off, repeat every 28d. May take on an empty stomach to decrease N/V (Patient not taking: Reported on 11/07/2018) 5 capsule 0  . temozolomide (TEMODAR) 100 MG capsule Take 1 capsule (100 mg total) by mouth daily. Take for 5 days on, 23 d off, repeat every 28d. May take on an empty stomach to decrease N/V (Patient not taking: Reported on 01/02/2019) 5 capsule 0  . temozolomide (TEMODAR) 100 MG capsule Take 1 capsule (100 mg total) by mouth daily. Take for 5 days on, 23 d off, repeat every 28d. May take on an empty stomach to decrease N/V (Patient not taking: Reported on 01/02/2019) 5 capsule 0  . temozolomide (TEMODAR) 140 MG capsule Take 1 capsule (140 mg total) by mouth daily. Take for 5 days on, 23 d off, repeat every 28d. May take on an empty stomach to decrease N/V  (Patient not taking: Reported on 11/07/2018) 5 capsule 0  . temozolomide (TEMODAR) 140 MG capsule Take 1 capsule (140 mg total) by mouth daily. Take for 5 days on, 23 d off, repeat every 28d. May take on an empty stomach to decrease N/V (Patient not taking: Reported on 11/07/2018) 5 capsule 0  . temozolomide (TEMODAR) 140 MG capsule Take 1 capsule (140 mg total) by mouth daily. Take for 5 days on, 23 d off, repeat every 28d. May take on an empty stomach to decrease N/V (Patient not taking: Reported on 11/07/2018) 5 capsule 0  . temozolomide (TEMODAR) 140 MG capsule Take 1 capsule (140 mg total) by mouth daily. Take for 5 days on, 23 d off, repeat every 28d. May take on an empty stomach to  decrease N/V (Patient not taking: Reported on 01/02/2019) 5 capsule 0  . temozolomide (TEMODAR) 140 MG capsule Take 1 capsule (140 mg total) by mouth daily. Take for 5 days on, 23 d off, repeat every 28d. May take on an empty stomach to decrease N/V (Patient not taking: Reported on 01/02/2019) 5 capsule 0  . temozolomide (TEMODAR) 140 MG capsule Take 1 capsule (140 mg total) by mouth daily. Take for 5 days on, 23 d off, repeat every 28d. May take on an empty stomach to decrease N/V (Patient not taking: Reported on 01/02/2019) 5 capsule 0  . temozolomide (TEMODAR) 20 MG capsule Take 1 capsule (20 mg total) by mouth daily. Take for 5 days on, 23 days off, repeat every 28d. May take on an empty stomach to decrease N/V (Patient not taking: Reported on 01/02/2019) 5 capsule 0  . temozolomide (TEMODAR) 20 MG capsule Take 1 capsule (20 mg total) by mouth daily. Take for 5 days on, 23 days off, repeat every 28d. May take on an empty stomach to decrease N/V 5 capsule 0  . temozolomide (TEMODAR) 20 MG capsule Take 1 capsule (20 mg total) by mouth daily. Take for 5 days on, 23 days off, repeat every 28d. May take on an empty stomach to decrease N/V (Patient not taking: Reported on 01/02/2019) 5 capsule 0  . temozolomide (TEMODAR) 20 MG  capsule Take 1 capsule (20 mg total) by mouth daily. Take for 5 days on, 23 days off, repeat every 28d. May take on an empty stomach to decrease N/V (Patient not taking: Reported on 01/02/2019) 5 capsule 0  . temozolomide (TEMODAR) 20 MG capsule Take 1 capsule (20 mg total) by mouth daily. Take for 5 days on, 23 days off, repeat every 28d. May take on an empty stomach to decrease N/V (Patient not taking: Reported on 01/02/2019) 5 capsule 0  . temozolomide (TEMODAR) 20 MG capsule Take 1 capsule (20 mg total) by mouth daily. Take for 5 days on, 23 days off, repeat every 28d. May take on an empty stomach to decrease N/V (Patient not taking: Reported on 01/02/2019) 5 capsule 0   No current facility-administered medications on file prior to visit.     Allergies:  Allergies  Allergen Reactions  . Other     Cats, pollen   Past Medical History:  Past Medical History:  Diagnosis Date  . Arthritis   . Brain cancer ([facility])    Glioblastoma  . Diabetes mellitus without complication ([facility])   . GERD (gastroesophageal reflux disease)   . Hypercholesterolemia   . Hypertension   . Thyroid disease    Past Surgical History:  Past Surgical History:  Procedure Laterality Date  . CRANIOTOMY    . HERNIA REPAIR    . 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 Marital status: Married    Spouse name: Not on file  . Number of children: Not on file  . Years of education: Not on file  . Highest education level: Not on file  Occupational History  . Not on file  Social Needs  . Financial resource strain: Not on file  . Food insecurity    Worry: Not on file    Inability: Not on file  . Transportation needs    Medical: Not on file    Non-medical: Not on file  Tobacco Use  . Smoking status: Former Smoker  Packs/day: 0.25    Years: 3.00    Pack years: 0.75    Types: Cigarettes    Quit date: 03/23/1961    Years since  quitting: 57.8  . Smokeless tobacco: Never Used  Substance and Sexual Activity  . Alcohol use: Yes    Alcohol/week: 1.0 standard drinks    Types: 1 Glasses of wine per week    Comment: weekly  . Drug use: Never  . Sexual activity: Not Currently  Lifestyle  . Physical activity    Days per week: Not on file    Minutes per session: Not on file  . Stress: Not on file  Relationships  . Social Herbalist on phone: Not on file    Gets together: Not on file    Attends religious service: Not on file    Active member of club or organization: Not on file    Attends meetings of clubs or organizations: Not on file    Relationship status: Not on file  . Intimate partner violence    Fear of current or ex partner: Not on file    Emotionally abused: Not on file    Physically abused: Not on file    Forced sexual activity: Not on file  Other Topics Concern  . Not on file  Social History Narrative   Married. Resides in [place].   Family History:  Family History  Problem Relation Age of Onset  . Diabetes Mellitus II Mother   . ALS Father     Review of Systems: Constitutional: Denies fevers, chills or abnormal weight loss Eyes: Denies blurriness of vision Ears, nose, mouth, throat, and face: Denies mucositis or sore throat Respiratory: Denies cough, dyspnea or wheezes Cardiovascular: Denies palpitation, chest discomfort or lower extremity swelling Gastrointestinal:  Denies nausea, constipation, diarrhea GU: Denies dysuria or incontinence Skin: Denies abnormal skin rashes Neurological: Per HPI Musculoskeletal: Denies joint pain, back or neck discomfort. No decrease in ROM Behavioral/Psych: Denies anxiety, disturbance in thought content, and mood instability  Physical Exam: Vitals:   01/02/19 1233  BP: (!) 143/69  Pulse: (!) 118  Resp: 18  Temp: 98.2 F (36.8 C)  SpO2: 95%   KPS: 70. General: Alert, cooperative, pleasant, in no acute distress Head: Craniotomy scar  noted, dry and intact. EENT: No conjunctival injection or scleral icterus. Oral mucosa moist Lungs: Resp effort normal Cardiac: Regular rate and rhythm Abdomen: Soft, non-distended abdomen Skin: No rashes cyanosis or petechiae. Extremities: no edema  Neurologic Exam: Mental Status: Awake, alert, attentive to examiner. Oriented to self and environment. Language is fairly densely impaired with regards to fluency, comprehension and repetition.   Cranial Nerves: Visual acuity is grossly normal. Visual fields are full. Extra-ocular movements intact. No ptosis. L UMN facial paresis, tongue midline. Motor: Tone and bulk are normal. Power is 4+/5 in left arm and 4+/5 in leg, with impaired fine motor function in left hand. Reflexes are symmetric, no pathologic reflexes present. Intact finger to nose bilaterally Sensory: Intact to light touch and temperature Gait: Walker assisted  Labs: I have reviewed the data as listed    Component Value Date/Time   NA 139 01/02/2019 1209   K 3.9 01/02/2019 1209   CL 104 01/02/2019 1209   CO2 24 01/02/2019 1209   GLUCOSE 184 (H) 01/02/2019 1209   BUN 21 01/02/2019 1209   CREATININE 1.19 01/02/2019 1209   CALCIUM 9.2 01/02/2019 1209   PROT 6.3 (L) 01/02/2019 1209   ALBUMIN 3.9 01/02/2019  1209   AST 12 (L) 01/02/2019 1209   ALT 17 01/02/2019 1209   ALKPHOS 35 (L) 01/02/2019 1209   BILITOT 0.3 01/02/2019 1209   GFRNONAA 59 (L) 01/02/2019 1209   GFRAA >60 01/02/2019 1209   Lab Results  Component Value Date   WBC 4.7 01/02/2019   NEUTROABS 3.6 01/02/2019   HGB 11.1 (L) 01/02/2019   HCT 32.5 (L) 01/02/2019   MCV 102.2 (H) 01/02/2019   PLT 156 01/02/2019     Assessment/Plan 1.  Glioblastoma, IDH-wildtype [facility])   Mr. Rockett is clinically and radiographically stable today.  Labs are WNL.    We recommended "break" from chemotherapy given his functional status, age and disease status.  Depending on results of upcoming scans we may resume Temodar.   He should continue Keppra 1529m BID and Zonisamide 1026mBID for seizure prevention.  We recommended decreasing dexamethasone to 41m16maily  We asked him to return to clinic in 2 months with MRI and labs for evaluation.    We appreciate the opportunity to participate in the care of EdwBerton [name] The total time spent in the encounter was 25 minutes and more than 50% was on counseling and review of test results   ZacVentura SellersD Medical Director of Neuro-Oncology [facility] [place]/12/20 12:51 PM

## 2019-01-03 ENCOUNTER — Other Ambulatory Visit: Payer: Self-pay | Admitting: *Deleted

## 2019-01-03 ENCOUNTER — Telehealth: Payer: Self-pay | Admitting: Internal Medicine

## 2019-01-03 NOTE — Telephone Encounter (Signed)
Scheduled appt per 10/12 los.  Spoke with patient spouse and she is aware of the appt date and time.

## 2019-01-30 ENCOUNTER — Other Ambulatory Visit: Payer: Self-pay | Admitting: Radiation Therapy

## 2019-03-03 ENCOUNTER — Other Ambulatory Visit: Payer: Self-pay

## 2019-03-03 ENCOUNTER — Ambulatory Visit (HOSPITAL_COMMUNITY)
Admission: RE | Admit: 2019-03-03 | Discharge: 2019-03-03 | Disposition: A | Discharge status: Home / Self Care | Payer: Medicare Other | Source: Ambulatory Visit | Attending: Internal Medicine | Admitting: Internal Medicine

## 2019-03-03 DIAGNOSIS — C711 Malignant neoplasm of frontal lobe: Secondary | ICD-10-CM | POA: Diagnosis not present

## 2019-03-03 LAB — POCT I-STAT CREATININE: Creatinine, Ser: 1.2 mg/dL (ref 0.61–1.24)

## 2019-03-03 MED ORDER — GADOBUTROL 1 MMOL/ML IV SOLN
7.5000 mL | Freq: Once | INTRAVENOUS | Status: AC | PRN
  Administered 2019-03-03: 7.5 mL via INTRAVENOUS

## 2019-03-06 ENCOUNTER — Telehealth: Payer: Self-pay | Admitting: Internal Medicine

## 2019-03-06 ENCOUNTER — Inpatient Hospital Stay: Payer: Medicare Other

## 2019-03-06 ENCOUNTER — Inpatient Hospital Stay: Payer: Medicare Other | Attending: Radiation Oncology

## 2019-03-06 ENCOUNTER — Inpatient Hospital Stay (HOSPITAL_BASED_OUTPATIENT_CLINIC_OR_DEPARTMENT_OTHER): Payer: Medicare Other | Admitting: Internal Medicine

## 2019-03-06 ENCOUNTER — Other Ambulatory Visit: Payer: Self-pay

## 2019-03-06 VITALS — BP 132/71 | HR 86 | Temp 98.0°F | Resp 18 | Ht 64.5 in | Wt 150.3 lb

## 2019-03-06 DIAGNOSIS — C711 Malignant neoplasm of frontal lobe: Secondary | ICD-10-CM | POA: Insufficient documentation

## 2019-03-06 DIAGNOSIS — Z79899 Other long term (current) drug therapy: Secondary | ICD-10-CM | POA: Insufficient documentation

## 2019-03-06 DIAGNOSIS — E119 Type 2 diabetes mellitus without complications: Secondary | ICD-10-CM | POA: Diagnosis not present

## 2019-03-06 DIAGNOSIS — Z923 Personal history of irradiation: Secondary | ICD-10-CM | POA: Insufficient documentation

## 2019-03-06 DIAGNOSIS — E7800 Pure hypercholesterolemia, unspecified: Secondary | ICD-10-CM | POA: Diagnosis not present

## 2019-03-06 DIAGNOSIS — I10 Essential (primary) hypertension: Secondary | ICD-10-CM | POA: Insufficient documentation

## 2019-03-06 DIAGNOSIS — M1990 Unspecified osteoarthritis, unspecified site: Secondary | ICD-10-CM | POA: Diagnosis not present

## 2019-03-06 DIAGNOSIS — K219 Gastro-esophageal reflux disease without esophagitis: Secondary | ICD-10-CM | POA: Insufficient documentation

## 2019-03-06 DIAGNOSIS — Z9221 Personal history of antineoplastic chemotherapy: Secondary | ICD-10-CM | POA: Insufficient documentation

## 2019-03-06 DIAGNOSIS — Z85828 Personal history of other malignant neoplasm of skin: Secondary | ICD-10-CM | POA: Insufficient documentation

## 2019-03-06 DIAGNOSIS — Z7984 Long term (current) use of oral hypoglycemic drugs: Secondary | ICD-10-CM | POA: Diagnosis not present

## 2019-03-06 DIAGNOSIS — Z87891 Personal history of nicotine dependence: Secondary | ICD-10-CM | POA: Diagnosis not present

## 2019-03-06 LAB — CBC WITH DIFFERENTIAL (CANCER CENTER ONLY)
Abs Immature Granulocytes: 0.05 10*3/uL (ref 0.00–0.07)
Basophils Absolute: 0 10*3/uL (ref 0.0–0.1)
Basophils Relative: 1 %
Eosinophils Absolute: 0.1 10*3/uL (ref 0.0–0.5)
Eosinophils Relative: 1 %
HCT: 34.3 % — ABNORMAL LOW (ref 39.0–52.0)
Hemoglobin: 11.9 g/dL — ABNORMAL LOW (ref 13.0–17.0)
Immature Granulocytes: 1 %
Lymphocytes Relative: 18 %
Lymphs Abs: 1 10*3/uL (ref 0.7–4.0)
MCH: 34.5 pg — ABNORMAL HIGH (ref 26.0–34.0)
MCHC: 34.7 g/dL (ref 30.0–36.0)
MCV: 99.4 fL (ref 80.0–100.0)
Monocytes Absolute: 0.6 10*3/uL (ref 0.1–1.0)
Monocytes Relative: 11 %
Neutro Abs: 3.9 10*3/uL (ref 1.7–7.7)
Neutrophils Relative %: 68 %
Platelet Count: 190 10*3/uL (ref 150–400)
RBC: 3.45 MIL/uL — ABNORMAL LOW (ref 4.22–5.81)
RDW: 12.1 % (ref 11.5–15.5)
WBC Count: 5.7 10*3/uL (ref 4.0–10.5)
nRBC: 0 % (ref 0.0–0.2)

## 2019-03-06 LAB — CMP (CANCER CENTER ONLY)
ALT: 16 U/L (ref 0–44)
AST: 12 U/L — ABNORMAL LOW (ref 15–41)
Albumin: 3.9 g/dL (ref 3.5–5.0)
Alkaline Phosphatase: 45 U/L (ref 38–126)
Anion gap: 11 (ref 5–15)
BUN: 27 mg/dL — ABNORMAL HIGH (ref 8–23)
CO2: 26 mmol/L (ref 22–32)
Calcium: 9.4 mg/dL (ref 8.9–10.3)
Chloride: 104 mmol/L (ref 98–111)
Creatinine: 1.21 mg/dL (ref 0.61–1.24)
GFR, Est AFR Am: >60 mL/min (ref >60)
GFR, Estimated: 57 mL/min — ABNORMAL LOW (ref >60)
Glucose, Bld: 168 mg/dL — ABNORMAL HIGH (ref 70–99)
Potassium: 4.1 mmol/L (ref 3.5–5.1)
Sodium: 141 mmol/L (ref 135–145)
Total Bilirubin: 0.3 mg/dL (ref 0.3–1.2)
Total Protein: 6.4 g/dL — ABNORMAL LOW (ref 6.5–8.1)

## 2019-03-06 MED ORDER — DEXAMETHASONE 1 MG PO TABS: ORAL_TABLET | ORAL | 1 refills | Status: DC

## 2019-03-06 NOTE — Progress Notes (Signed)
[facility] [place] [place], [place] [address] [phone]   Interval Evaluation  Date of Service: 03/06/19 Patient Name: Bryan [name] Patient MRN: [id number] Patient DOB: [birth year] Provider: Ventura Sellers, MD  Identifying Statement:  Bryan [name] is a 78 y.o. male with right frontal glioblastoma   Oncologic History: Oncology History   Glioblastoma, IDH-wildtype ([place])   03/07/2018 Surgery   Craniotomy, resection by Dr. Tommi Rumps ([facility]).  Path is Glioblastoma   03/29/2018 - 04/04/2018 Chemotherapy   The patient had [No matching medication found in this treatment plan]  for chemotherapy treatment.    04/04/2018 - 05/13/2018 Radiation Therapy   IMRT and concurrent Temozolomide with Dr. Tammi Klippel   06/09/2018 -  Chemotherapy   The patient had [No matching medication found in this treatment plan]  for chemotherapy treatment.      Biomarkers:  MGMT Unknown.  IDH 1/2 Wild type.  EGFR Expressed  TERT Unknown   Interval History: Bryan [name] presents today for follow up after recent MRI brain.  He has discontinued the Ritalin because of poor efficacy.  Wife describes stability of gait, with continued continued dragging of the left leg.  Left arm and hand are ignored often by the patient. Continues to take decadron '1mg'$  twice per day. Speaking/fluency is stable.  History is overall still limited by language impairments, wife assisted again with history.    H+P (02/16/18) Bryan [name] 78 y.o. male presented after family noted drooping of the left side of his face yesterday.  In addition, he/they describe some cognitive changes, such as difficulty "finding words" in certain situations.  The patient also feels like his left leg is "lagging" a little behind his right lately.  He is walking on his own, however, and with no falls.  Denies headaches, seizures.   Medications: Current Outpatient Medications on File Prior to Visit    Medication Sig Dispense Refill  . acetaminophen (TYLENOL) 325 MG tablet Take 2 tablets (650 mg total) by mouth every 6 (six) hours as needed for mild pain.    [name] atorvastatin (LIPITOR) 40 MG tablet Take 1 tablet (40 mg total) by mouth daily. 30 tablet 0  . Cholecalciferol (D-3-5) 125 MCG (5000 UT) capsule Take 1 capsule by mouth daily.    . Cyanocobalamin (B-12) 1000 MCG LOZG Take 2 lozenges by mouth daily.    [name] dexamethasone (DECADRON) 1 MG tablet TAKE 1 TABLET TWICE DAILY 60 tablet 1  . Flaxseed, Linseed, (FLAXSEED OIL PO) Take by mouth.    . fluticasone (FLONASE) 50 MCG/ACT nasal spray Place 1 spray into both nostrils 2 (two) times daily.    . Ginkgo Biloba Extract (GINKGO BILOBA MEMORY ENHANCER) 60 MG CAPS Take by mouth.    . levETIRAcetam (KEPPRA) 100 MG/ML solution Take 15 mLs (1,500 mg total) by mouth 2 (two) times daily. 473 mL 12  . levothyroxine (SYNTHROID, LEVOTHROID) 75 MCG tablet Take 1 tablet (75 mcg total) by mouth daily before breakfast. 30 tablet 0  . lisinopril (ZESTRIL) 10 MG tablet Take 1 tablet by mouth daily.    . metFORMIN (GLUCOPHAGE) 1000 MG tablet Take 1 tablet (1,000 mg total) by mouth 2 (two) times daily with a meal. 60 tablet 1  . methylphenidate (RITALIN) 5 MG tablet Take 1 tablet (5 mg total) by mouth 2 (two) times daily with breakfast and lunch. 60 tablet 0  . Multiple Vitamins-Minerals (MULTI-DAY PLUS MINERALS) TABS Take 1 tablet by mouth daily.    [name]  naphazoline-pheniramine (NAPHCON-A) 0.025-0.3 % ophthalmic solution Place 1 drop into both eyes as needed for eye irritation or allergies.    . Omega-3 1000 MG CAPS Take by mouth.    . ondansetron (ZOFRAN) 4 MG tablet Take 1 tablet (4 mg total) by mouth every 6 (six) hours as needed for nausea or vomiting. 30 tablet 1  . ondansetron (ZOFRAN) 8 MG tablet Take 1 tablet (8 mg total) by mouth 2 (two) times daily as needed (nausea and vomiting). take 30-60 minutes prior to Temodar (Patient not taking: Reported on  01/02/2019) 30 tablet 1  . ondansetron (ZOFRAN) 8 MG tablet Take 1 tablet (8 mg total) by mouth 2 (two) times daily as needed (nausea and vomiting). take 30-60 minutes prior to Temodar (Patient not taking: Reported on 01/02/2019) 30 tablet 1  . pantoprazole (PROTONIX) 40 MG tablet TAKE 1 TABLET EVERY DAY 90 tablet 3  . temozolomide (TEMODAR) 100 MG capsule Take 1 capsule (100 mg total) by mouth daily. Take for 5 days on, 23 d off, repeat every 28d. May take on an empty stomach to decrease N/V (Patient not taking: Reported on 11/07/2018) 5 capsule 0  . temozolomide (TEMODAR) 100 MG capsule Take 1 capsule (100 mg total) by mouth daily. Take for 5 days on, 23 d off, repeat every 28d. May take on an empty stomach to decrease N/V 5 capsule 0  . temozolomide (TEMODAR) 100 MG capsule Take 1 capsule (100 mg total) by mouth daily. Take for 5 days on, 23 d off, repeat every 28d. May take on an empty stomach to decrease N/V (Patient not taking: Reported on 11/07/2018) 5 capsule 0  . temozolomide (TEMODAR) 100 MG capsule Take 1 capsule (100 mg total) by mouth daily. Take for 5 days on, 23 d off, repeat every 28d. May take on an empty stomach to decrease N/V (Patient not taking: Reported on 11/07/2018) 5 capsule 0  . temozolomide (TEMODAR) 100 MG capsule Take 1 capsule (100 mg total) by mouth daily. Take for 5 days on, 23 d off, repeat every 28d. May take on an empty stomach to decrease N/V (Patient not taking: Reported on 01/02/2019) 5 capsule 0  . temozolomide (TEMODAR) 100 MG capsule Take 1 capsule (100 mg total) by mouth daily. Take for 5 days on, 23 d off, repeat every 28d. May take on an empty stomach to decrease N/V (Patient not taking: Reported on 01/02/2019) 5 capsule 0  . temozolomide (TEMODAR) 140 MG capsule Take 1 capsule (140 mg total) by mouth daily. Take for 5 days on, 23 d off, repeat every 28d. May take on an empty stomach to decrease N/V (Patient not taking: Reported on 11/07/2018) 5 capsule 0  .  temozolomide (TEMODAR) 140 MG capsule Take 1 capsule (140 mg total) by mouth daily. Take for 5 days on, 23 d off, repeat every 28d. May take on an empty stomach to decrease N/V (Patient not taking: Reported on 11/07/2018) 5 capsule 0  . temozolomide (TEMODAR) 140 MG capsule Take 1 capsule (140 mg total) by mouth daily. Take for 5 days on, 23 d off, repeat every 28d. May take on an empty stomach to decrease N/V (Patient not taking: Reported on 11/07/2018) 5 capsule 0  . temozolomide (TEMODAR) 140 MG capsule Take 1 capsule (140 mg total) by mouth daily. Take for 5 days on, 23 d off, repeat every 28d. May take on an empty stomach to decrease N/V (Patient not taking: Reported on 01/02/2019) 5 capsule 0  .  temozolomide (TEMODAR) 140 MG capsule Take 1 capsule (140 mg total) by mouth daily. Take for 5 days on, 23 d off, repeat every 28d. May take on an empty stomach to decrease N/V (Patient not taking: Reported on 01/02/2019) 5 capsule 0  . temozolomide (TEMODAR) 140 MG capsule Take 1 capsule (140 mg total) by mouth daily. Take for 5 days on, 23 d off, repeat every 28d. May take on an empty stomach to decrease N/V (Patient not taking: Reported on 01/02/2019) 5 capsule 0  . temozolomide (TEMODAR) 20 MG capsule Take 1 capsule (20 mg total) by mouth daily. Take for 5 days on, 23 days off, repeat every 28d. May take on an empty stomach to decrease N/V (Patient not taking: Reported on 01/02/2019) 5 capsule 0  . temozolomide (TEMODAR) 20 MG capsule Take 1 capsule (20 mg total) by mouth daily. Take for 5 days on, 23 days off, repeat every 28d. May take on an empty stomach to decrease N/V 5 capsule 0  . temozolomide (TEMODAR) 20 MG capsule Take 1 capsule (20 mg total) by mouth daily. Take for 5 days on, 23 days off, repeat every 28d. May take on an empty stomach to decrease N/V (Patient not taking: Reported on 01/02/2019) 5 capsule 0  . temozolomide (TEMODAR) 20 MG capsule Take 1 capsule (20 mg total) by mouth daily. Take  for 5 days on, 23 days off, repeat every 28d. May take on an empty stomach to decrease N/V (Patient not taking: Reported on 01/02/2019) 5 capsule 0  . temozolomide (TEMODAR) 20 MG capsule Take 1 capsule (20 mg total) by mouth daily. Take for 5 days on, 23 days off, repeat every 28d. May take on an empty stomach to decrease N/V (Patient not taking: Reported on 01/02/2019) 5 capsule 0  . temozolomide (TEMODAR) 20 MG capsule Take 1 capsule (20 mg total) by mouth daily. Take for 5 days on, 23 days off, repeat every 28d. May take on an empty stomach to decrease N/V (Patient not taking: Reported on 01/02/2019) 5 capsule 0  . zonisamide (ZONEGRAN) 100 MG capsule Take 1 capsule (100 mg total) by mouth daily. 90 capsule 1   No current facility-administered medications on file prior to visit.    Allergies:  Allergies  Allergen Reactions  . Other     Cats, pollen   Past Medical History:  Past Medical History:  Diagnosis Date  . Arthritis   . Brain cancer ([place])    Glioblastoma  . Diabetes mellitus without complication ([place])   . GERD (gastroesophageal reflux disease)   . Hypercholesterolemia   . Hypertension   . Thyroid disease    Past Surgical History:  Past Surgical History:  Procedure Laterality Date  . CRANIOTOMY    . HERNIA REPAIR    . 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 Marital status: Married    Spouse name: Not on file  . Number of children: Not on file  . Years of education: Not on file  . Highest education level: Not on file  Occupational History  . Not on file  Tobacco Use  . Smoking status: Former Smoker    Packs/day: 0.25    Years: 3.00    Pack years: 0.75    Types: Cigarettes    Quit date: 03/23/1961    Years since quitting: 57.9  . Smokeless tobacco: Never Used  Substance  and Sexual Activity  . Alcohol use: Yes    Alcohol/week: 1.0 standard drinks    Types: 1 Glasses of  wine per week    Comment: weekly  . Drug use: Never  . Sexual activity: Not Currently  Other Topics Concern  . Not on file  Social History Narrative   Married. Resides in [place].   Social Determinants of Health   Financial Resource Strain:   . Difficulty of Paying Living Expenses: Not on file  Food Insecurity:   . Worried About Charity fundraiser in the Last Year: Not on file  . Ran Out of Food in the Last Year: Not on file  Transportation Needs:   . Lack of Transportation (Medical): Not on file  . Lack of Transportation (Non-Medical): Not on file  Physical Activity:   . Days of Exercise per Week: Not on file  . Minutes of Exercise per Session: Not on file  Stress:   . Feeling of Stress : Not on file  Social Connections:   . Frequency of Communication with Friends and Family: Not on file  . Frequency of Social Gatherings with Friends and Family: Not on file  . Attends Religious Services: Not on file  . Active Member of Clubs or Organizations: Not on file  . Attends Archivist Meetings: Not on file  . Marital Status: Not on file  Intimate Partner Violence:   . Fear of Current or Ex-Partner: Not on file  . Emotionally Abused: Not on file  . Physically Abused: Not on file  . Sexually Abused: Not on file   Family History:  Family History  Problem Relation Age of Onset  . Diabetes Mellitus II Mother   . ALS Father     Review of Systems: Constitutional: Denies fevers, chills or abnormal weight loss Eyes: Denies blurriness of vision Ears, nose, mouth, throat, and face: Denies mucositis or sore throat Respiratory: Denies cough, dyspnea or wheezes Cardiovascular: Denies palpitation, chest discomfort or lower extremity swelling Gastrointestinal:  Denies nausea, constipation, diarrhea GU: Denies dysuria or incontinence Skin: Denies abnormal skin rashes Neurological: Per HPI Musculoskeletal: Denies joint pain, back or neck discomfort. No decrease in  ROM Behavioral/Psych: Denies anxiety, disturbance in thought content, and mood instability  Physical Exam: Vitals:   03/06/19 1044  BP: 132/71  Pulse: 86  Resp: 18  Temp: 98 F (36.7 C)  SpO2: 100%   KPS: 70. General: Alert, cooperative, pleasant, in no acute distress Head: Craniotomy scar noted, dry and intact. EENT: No conjunctival injection or scleral icterus. Oral mucosa moist Lungs: Resp effort normal Cardiac: Regular rate and rhythm Abdomen: Soft, non-distended abdomen Skin: No rashes cyanosis or petechiae. Extremities: no edema  Neurologic Exam: Mental Status: Awake, alert, attentive to examiner. Oriented to self and environment. Language is fairly densely impaired with regards to fluency, comprehension and repetition.   Cranial Nerves: Visual acuity is grossly normal. Visual fields are full. Extra-ocular movements intact. No ptosis. L UMN facial paresis, tongue midline. Motor: Tone and bulk are normal. Power is 4+/5 in left arm and 4+/5 in leg, with impaired fine motor function in left hand.  Left thenar atrophy noted. Reflexes are symmetric, no pathologic reflexes present. Intact finger to nose bilaterally Sensory: Intact to light touch and temperature Gait: Walker assisted  Labs: I have reviewed the data as listed    Component Value Date/Time   NA 139 01/02/2019 1209   K 3.9 01/02/2019 1209   CL 104 01/02/2019 1209  CO2 24 01/02/2019 1209   GLUCOSE 184 (H) 01/02/2019 1209   BUN 21 01/02/2019 1209   CREATININE 1.20 03/03/2019 1208   CREATININE 1.19 01/02/2019 1209   CALCIUM 9.2 01/02/2019 1209   PROT 6.3 (L) 01/02/2019 1209   ALBUMIN 3.9 01/02/2019 1209   AST 12 (L) 01/02/2019 1209   ALT 17 01/02/2019 1209   ALKPHOS 35 (L) 01/02/2019 1209   BILITOT 0.3 01/02/2019 1209   GFRNONAA 59 (L) 01/02/2019 1209   GFRAA >60 01/02/2019 1209   Lab Results  Component Value Date   WBC 4.7 01/02/2019   NEUTROABS 3.6 01/02/2019   HGB 11.1 (L) 01/02/2019   HCT 32.5  (L) 01/02/2019   MCV 102.2 (H) 01/02/2019   PLT 156 01/02/2019    Imaging:  [place] Clinician Interpretation: I have personally reviewed the CNS images as listed.  My interpretation, in the context of the patient's clinical presentation, is stable disease  MR Brain W Wo Contrast  Result Date: 03/03/2019 CLINICAL DATA:  Follow-up right glioblastoma. EXAM: MRI HEAD WITHOUT AND WITH CONTRAST TECHNIQUE: Multiplanar, multiecho pulse sequences of the brain and surrounding structures were obtained without and with intravenous contrast. CONTRAST:  7.37m GADAVIST GADOBUTROL 1 MMOL/ML IV SOLN COMPARISON:  12/02/2018.  10/07/2018. FINDINGS: Brain: Stable examination. Previous craniotomy on the right with excision or biopsy. Post resection space is stable. Residual enhancing tissue along the margins of the resection are stable, measuring about 2324 mm in size. No significant change in diffuse white matter signal throughout the right hemisphere. No increasing mass effect. No change in the restricted diffusion pattern. No evidence of interval hemorrhage. Ventricular size is unchanged. Chronic small-vessel changes of the left hemisphere peer stable. Wallerian degeneration of the brainstem on the right as seen previously. Vascular: Major vessels at the base of the brain show flow. Skull and upper cervical spine: Otherwise negative Sinuses/Orbits: Chronic opacification of the left maxillary sinus as seen previously. Orbits negative. Other: None IMPRESSION: No progressive or worsening finding. Postsurgical space in the deep insular region on the right is stable. Marginal enhancement is stable. No developing mass effect. Diffuse right hemisphere white matter signal is stable. Electronically Signed   By: MNelson ChimesM.D.   On: 03/03/2019 14:08    Assessment/Plan 1.  Glioblastoma, IDH-wildtype ([facility]   Mr. MMetoyeris clinically and radiographically stable today.    We recommended continuing MRI surveillance.  He  should continue Keppra 15097mBID and Zonisamide 10065mID for seizure prevention.  He should remain active with home physical therapy sessions, with particular attention to left hand.  We recommended decreasing dexamethasone to 1mg41mily  We asked him to return to clinic in 2 months with MRI for evaluation.    We appreciate the opportunity to participate in the care of [facility]In[facility] HospitalThe total time spent in the encounter was 40 minutes and more than 50% was on counseling and review of test results   ZachVentura Sellers Medical Director of Neuro-Oncology [facility] Surgery CenterWeslClymer14/20 10:37 AM

## 2019-03-06 NOTE — Telephone Encounter (Signed)
Scheduled appt per 12/14 los.  Spoke with pt spouse and she is aware of the appt date and time.

## 2019-03-28 ENCOUNTER — Other Ambulatory Visit: Payer: Self-pay | Admitting: Internal Medicine

## 2019-03-28 NOTE — Telephone Encounter (Signed)
Please see refill request.

## 2019-04-14 DIAGNOSIS — C719 Malignant neoplasm of brain, unspecified: Secondary | ICD-10-CM | POA: Diagnosis not present

## 2019-04-14 DIAGNOSIS — I2510 Atherosclerotic heart disease of native coronary artery without angina pectoris: Secondary | ICD-10-CM | POA: Diagnosis not present

## 2019-04-14 DIAGNOSIS — D126 Benign neoplasm of colon, unspecified: Secondary | ICD-10-CM | POA: Diagnosis not present

## 2019-04-14 DIAGNOSIS — G8190 Hemiplegia, unspecified affecting unspecified side: Secondary | ICD-10-CM | POA: Diagnosis not present

## 2019-04-14 DIAGNOSIS — J984 Other disorders of lung: Secondary | ICD-10-CM | POA: Diagnosis not present

## 2019-04-14 DIAGNOSIS — D6481 Anemia due to antineoplastic chemotherapy: Secondary | ICD-10-CM | POA: Diagnosis not present

## 2019-04-14 DIAGNOSIS — E119 Type 2 diabetes mellitus without complications: Secondary | ICD-10-CM | POA: Diagnosis not present

## 2019-04-14 DIAGNOSIS — I10 Essential (primary) hypertension: Secondary | ICD-10-CM | POA: Diagnosis not present

## 2019-04-14 DIAGNOSIS — E039 Hypothyroidism, unspecified: Secondary | ICD-10-CM | POA: Diagnosis not present

## 2019-04-14 DIAGNOSIS — E785 Hyperlipidemia, unspecified: Secondary | ICD-10-CM | POA: Diagnosis not present

## 2019-04-21 ENCOUNTER — Ambulatory Visit: Payer: Medicare Other

## 2019-04-24 ENCOUNTER — Other Ambulatory Visit: Payer: Self-pay | Admitting: Radiation Therapy

## 2019-04-26 ENCOUNTER — Encounter: Payer: Self-pay | Admitting: Internal Medicine

## 2019-04-29 ENCOUNTER — Ambulatory Visit: Payer: Medicare Other | Attending: Internal Medicine

## 2019-04-29 DIAGNOSIS — Z23 Encounter for immunization: Secondary | ICD-10-CM | POA: Insufficient documentation

## 2019-04-29 NOTE — Progress Notes (Signed)
   Covid-19 Vaccination Clinic  Name:  Bryan [name]    MRN: [id number] DOB: 1940-07-01  04/29/2019  Bryan [name] was observed post Covid-19 immunization for 15 minutes without incidence. He was provided with Vaccine Information Sheet and instruction to access the V-Safe system.   Bryan [name] was instructed to call 911 with any severe reactions post vaccine: [name] Difficulty breathing  . Swelling of your face and throat  . A fast heartbeat  . A bad rash all over your body  . Dizziness and weakness    Immunizations Administered    Name Date Dose VIS Date Route   Pfizer COVID-19 Vaccine 04/29/2019  5:23 PM 0.3 mL 03/03/2019 Intramuscular   Manufacturer: [place]   Lot: [id number]   [place]: [id number]

## 2019-05-01 ENCOUNTER — Other Ambulatory Visit: Payer: Self-pay | Admitting: Internal Medicine

## 2019-05-01 ENCOUNTER — Encounter: Payer: Self-pay | Admitting: Internal Medicine

## 2019-05-01 MED ORDER — DEXAMETHASONE 1 MG PO TABS: ORAL_TABLET | ORAL | 1 refills | Status: DC

## 2019-05-05 ENCOUNTER — Ambulatory Visit
Admission: RE | Admit: 2019-05-05 | Discharge: 2019-05-05 | Disposition: A | Discharge status: Home / Self Care | Payer: Medicare Other | Source: Ambulatory Visit | Attending: Internal Medicine | Admitting: Internal Medicine

## 2019-05-05 DIAGNOSIS — C711 Malignant neoplasm of frontal lobe: Secondary | ICD-10-CM | POA: Diagnosis not present

## 2019-05-05 MED ORDER — GADOBENATE DIMEGLUMINE 529 MG/ML IV SOLN
14.0000 mL | Freq: Once | INTRAVENOUS | Status: AC | PRN
  Administered 2019-05-05: 13:00:00 14 mL via INTRAVENOUS

## 2019-05-08 ENCOUNTER — Other Ambulatory Visit: Payer: Self-pay

## 2019-05-08 ENCOUNTER — Inpatient Hospital Stay: Payer: Medicare Other | Attending: Radiation Oncology | Admitting: Internal Medicine

## 2019-05-08 ENCOUNTER — Inpatient Hospital Stay: Payer: Medicare Other

## 2019-05-08 VITALS — BP 137/78 | HR 113 | Temp 97.8°F | Resp 20 | Ht 64.5 in | Wt 146.3 lb

## 2019-05-08 DIAGNOSIS — R269 Unspecified abnormalities of gait and mobility: Secondary | ICD-10-CM | POA: Diagnosis not present

## 2019-05-08 DIAGNOSIS — Z9221 Personal history of antineoplastic chemotherapy: Secondary | ICD-10-CM | POA: Insufficient documentation

## 2019-05-08 DIAGNOSIS — Z85828 Personal history of other malignant neoplasm of skin: Secondary | ICD-10-CM | POA: Insufficient documentation

## 2019-05-08 DIAGNOSIS — E119 Type 2 diabetes mellitus without complications: Secondary | ICD-10-CM | POA: Insufficient documentation

## 2019-05-08 DIAGNOSIS — Z7952 Long term (current) use of systemic steroids: Secondary | ICD-10-CM | POA: Insufficient documentation

## 2019-05-08 DIAGNOSIS — Z7984 Long term (current) use of oral hypoglycemic drugs: Secondary | ICD-10-CM | POA: Insufficient documentation

## 2019-05-08 DIAGNOSIS — Z87891 Personal history of nicotine dependence: Secondary | ICD-10-CM | POA: Insufficient documentation

## 2019-05-08 DIAGNOSIS — E7800 Pure hypercholesterolemia, unspecified: Secondary | ICD-10-CM | POA: Diagnosis not present

## 2019-05-08 DIAGNOSIS — R7301 Impaired fasting glucose: Secondary | ICD-10-CM | POA: Diagnosis not present

## 2019-05-08 DIAGNOSIS — E559 Vitamin D deficiency, unspecified: Secondary | ICD-10-CM | POA: Diagnosis not present

## 2019-05-08 DIAGNOSIS — C711 Malignant neoplasm of frontal lobe: Secondary | ICD-10-CM | POA: Insufficient documentation

## 2019-05-08 DIAGNOSIS — Q043 Other reduction deformities of brain: Secondary | ICD-10-CM | POA: Diagnosis not present

## 2019-05-08 DIAGNOSIS — I10 Essential (primary) hypertension: Secondary | ICD-10-CM | POA: Insufficient documentation

## 2019-05-08 DIAGNOSIS — M1990 Unspecified osteoarthritis, unspecified site: Secondary | ICD-10-CM | POA: Diagnosis not present

## 2019-05-08 DIAGNOSIS — Z79899 Other long term (current) drug therapy: Secondary | ICD-10-CM | POA: Insufficient documentation

## 2019-05-08 DIAGNOSIS — C719 Malignant neoplasm of brain, unspecified: Secondary | ICD-10-CM | POA: Diagnosis not present

## 2019-05-08 DIAGNOSIS — K219 Gastro-esophageal reflux disease without esophagitis: Secondary | ICD-10-CM | POA: Insufficient documentation

## 2019-05-08 DIAGNOSIS — E038 Other specified hypothyroidism: Secondary | ICD-10-CM | POA: Diagnosis not present

## 2019-05-08 DIAGNOSIS — Z923 Personal history of irradiation: Secondary | ICD-10-CM | POA: Insufficient documentation

## 2019-05-08 DIAGNOSIS — E538 Deficiency of other specified B group vitamins: Secondary | ICD-10-CM | POA: Diagnosis not present

## 2019-05-08 MED ORDER — LEVETIRACETAM 100 MG/ML PO SOLN: 1000.0000 mg | Freq: Two times a day (BID) | ORAL | 2 refills | Status: DC

## 2019-05-08 NOTE — Progress Notes (Signed)
[facility] at [place], [place] [address] [phone]   Interval Evaluation  Date of Service: 05/08/19 Patient Name: Bryan [name] Patient MRN: [id number] Patient DOB: 1940/05/09 Provider: Ventura Sellers, MD  Identifying Statement:  Bryan [name] is a 79 y.o. male with right frontal glioblastoma   Oncologic History: Oncology History   Glioblastoma, IDH-wildtype ([place])   03/07/2018 Surgery   Craniotomy, resection by Dr. Tommi Rumps ([facility]).  Path is Glioblastoma   03/29/2018 - 04/04/2018 Chemotherapy   The patient had [No matching medication found in this treatment plan]  for chemotherapy treatment.    04/04/2018 - 05/13/2018 Radiation Therapy   IMRT and concurrent Temozolomide with Dr. Tammi Klippel   06/09/2018 - 01/02/2019 Chemotherapy   The patient completed 6 cycles of adjuvant 5-day Temozolomide       Biomarkers:  MGMT Unknown.  IDH 1/2 Wild type.  EGFR Expressed  TERT Unknown   Interval History: Saagar Tortorella presents today for follow up after recent MRI brain.  Speaking/fluency is somwhat worse from prior.  He describes overall stability of gait, with continued continued dragging of the left leg.  Left arm and hand are still ignored often. Continues to take decadron 57m twice per day.  History is overall still limited by language impairments, wife assisted again with history.    H+P (02/16/18) Bryan Bon89y.o. male presented after family noted drooping of the left side of his face yesterday.  In addition, he/they describe some cognitive changes, such as difficulty "finding words" in certain situations.  The patient also feels like his left leg is "lagging" a little behind his right lately.  He is walking on his own, however, and with no falls.  Denies headaches, seizures.   Medications: Current Outpatient Medications on File Prior to Visit  Medication Sig Dispense Refill  . acetaminophen (TYLENOL) 325 MG tablet Take  2 tablets (650 mg total) by mouth every 6 (six) hours as needed for mild pain.    .Marland Kitchenatorvastatin (LIPITOR) 40 MG tablet Take 1 tablet (40 mg total) by mouth daily. 30 tablet 0  . Bacillus Coagulans-Inulin (ALIGN PREBIOTIC-PROBIOTIC PO) Take 1 tablet by mouth daily.    . cetirizine-pseudoephedrine (ZYRTEC-D ALLERGY & CONGESTION) 5-120 MG tablet Take 1 tablet by mouth 2 (two) times daily.    . Cholecalciferol (D-3-5) 125 MCG (5000 UT) capsule Take 1 capsule by mouth daily.    . Cyanocobalamin (B-12) 1000 MCG LOZG Take 2 lozenges by mouth daily.    .Marland Kitchendexamethasone (DECADRON) 1 MG tablet TAKE 1 TABLET DAILY 60 tablet 1  . fluticasone (FLONASE) 50 MCG/ACT nasal spray Place 1 spray into both nostrils 2 (two) times daily.    .Marland KitchenlevETIRAcetam (KEPPRA) 100 MG/ML solution TAKE 15 ML BY MOUTH TWO TIMES DAILY. 2700 mL 1  . levothyroxine (SYNTHROID, LEVOTHROID) 75 MCG tablet Take 1 tablet (75 mcg total) by mouth daily before breakfast. 30 tablet 0  . lisinopril (ZESTRIL) 10 MG tablet Take 1 tablet by mouth daily.    . metFORMIN (GLUCOPHAGE) 1000 MG tablet Take 1 tablet (1,000 mg total) by mouth 2 (two) times daily with a meal. 60 tablet 1  . naphazoline-pheniramine (NAPHCON-A) 0.025-0.3 % ophthalmic solution Place 1 drop into both eyes as needed for eye irritation or allergies.    . pantoprazole (PROTONIX) 40 MG tablet TAKE 1 TABLET EVERY DAY 90 tablet 3  . Zinc Gluconate 50 MG CAPS Take 1 tablet by mouth daily.    .[name]  zonisamide (ZONEGRAN) 100 MG capsule Take 1 capsule (100 mg total) by mouth daily. 90 capsule 1   No current facility-administered medications on file prior to visit.    Allergies:  Allergies  Allergen Reactions  . Other     Cats, pollen   Past Medical History:  Past Medical History:  Diagnosis Date  . Arthritis   . Brain cancer ([place])    Glioblastoma  . Diabetes mellitus without complication ([place])   . GERD (gastroesophageal reflux disease)   . Hypercholesterolemia   . Hypertension     . Thyroid disease    Past Surgical History:  Past Surgical History:  Procedure Laterality Date  . CRANIOTOMY    . HERNIA REPAIR    . 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 Marital status: Married    Spouse name: Not on file  . Number of children: Not on file  . Years of education: Not on file  . Highest education level: Not on file  Occupational History  . Not on file  Tobacco Use  . Smoking status: Former Smoker    Packs/day: 0.25    Years: 3.00    Pack years: 0.75    Types: Cigarettes    Quit date: 03/23/1961    Years since quitting: 58.1  . Smokeless tobacco: Never Used  Substance and Sexual Activity  . Alcohol use: Yes    Alcohol/week: 1.0 standard drinks    Types: 1 Glasses of wine per week    Comment: weekly  . Drug use: Never  . Sexual activity: Not Currently  Other Topics Concern  . Not on file  Social History Narrative   Married. Resides in [place].   Social Determinants of Health   Financial Resource Strain:   . Difficulty of Paying Living Expenses: Not on file  Food Insecurity:   . Worried About Charity fundraiser in the Last Year: Not on file  . Ran Out of Food in the Last Year: Not on file  Transportation Needs:   . Lack of Transportation (Medical): Not on file  . Lack of Transportation (Non-Medical): Not on file  Physical Activity:   . Days of Exercise per Week: Not on file  . Minutes of Exercise per Session: Not on file  Stress:   . Feeling of Stress : Not on file  Social Connections:   . Frequency of Communication with Friends and Family: Not on file  . Frequency of Social Gatherings with Friends and Family: Not on file  . Attends Religious Services: Not on file  . Active Member of Clubs or Organizations: Not on file  . Attends Archivist Meetings: Not on file  . Marital Status: Not on file  Intimate Partner Violence:   . Fear of Current  or Ex-Partner: Not on file  . Emotionally Abused: Not on file  . Physically Abused: Not on file  . Sexually Abused: Not on file   Family History:  Family History  Problem Relation Age of Onset  . Diabetes Mellitus II Mother   . ALS Father     Review of Systems: Constitutional: Denies fevers, chills or abnormal weight loss Eyes: Denies blurriness of vision Ears, nose, mouth, throat, and face: Denies mucositis or sore throat Respiratory: Denies cough, dyspnea or wheezes Cardiovascular: Denies palpitation, chest discomfort or lower extremity swelling Gastrointestinal:  Denies nausea, constipation, diarrhea GU: Denies dysuria  or incontinence Skin: Denies abnormal skin rashes Neurological: Per HPI Musculoskeletal: Denies joint pain, back or neck discomfort. No decrease in ROM Behavioral/Psych: Denies anxiety, disturbance in thought content, and mood instability  Physical Exam: Vitals:   05/08/19 1150  BP: 137/78  Pulse: (!) 113  Resp: 20  Temp: 97.8 F (36.6 C)  SpO2: 99%   KPS: 70. General: Alert, cooperative, pleasant, in no acute distress Head: Craniotomy scar noted, dry and intact. EENT: No conjunctival injection or scleral icterus. Oral mucosa moist Lungs: Resp effort normal Cardiac: Regular rate and rhythm Abdomen: Soft, non-distended abdomen Skin: No rashes cyanosis or petechiae. Extremities: no edema  Neurologic Exam: Mental Status: Awake, alert, attentive to examiner. Oriented to self and environment. Language is fairly densely impaired with regards to fluency, comprehension and repetition.   Cranial Nerves: Visual acuity is grossly normal. Visual fields are full. Extra-ocular movements intact. No ptosis. L UMN facial paresis, tongue midline. Motor: Tone and bulk are normal. Power is 4+/5 in left arm and 4+/5 in leg, with impaired fine motor function in left hand.  Left thenar atrophy noted. Reflexes are symmetric, no pathologic reflexes present. Intact finger to  nose bilaterally Sensory: Intact to light touch and temperature Gait: Walker assisted  Labs: I have reviewed the data as listed    Component Value Date/Time   NA 141 03/06/2019 1026   K 4.1 03/06/2019 1026   CL 104 03/06/2019 1026   CO2 26 03/06/2019 1026   GLUCOSE 168 (H) 03/06/2019 1026   BUN 27 (H) 03/06/2019 1026   CREATININE 1.21 03/06/2019 1026   CALCIUM 9.4 03/06/2019 1026   PROT 6.4 (L) 03/06/2019 1026   ALBUMIN 3.9 03/06/2019 1026   AST 12 (L) 03/06/2019 1026   ALT 16 03/06/2019 1026   ALKPHOS 45 03/06/2019 1026   BILITOT 0.3 03/06/2019 1026   GFRNONAA 57 (L) 03/06/2019 1026   GFRAA >60 03/06/2019 1026   Lab Results  Component Value Date   WBC 5.7 03/06/2019   NEUTROABS 3.9 03/06/2019   HGB 11.9 (L) 03/06/2019   HCT 34.3 (L) 03/06/2019   MCV 99.4 03/06/2019   PLT 190 03/06/2019    Imaging:  [place] [facility] Clinician Interpretation: I have personally reviewed the CNS images as listed.  My interpretation, in the context of the patient's clinical presentation, is stable disease  MR Brain W Wo Contrast  Result Date: 05/05/2019 CLINICAL DATA:  Malignant frontal lobe tumor. Brain/CNS neoplasm, assess treatment response. Additional history provided: Follow-up radiation treatment. Additional history obtained from [place], IDH wild-type Creatinine was obtained on site at [facility] at [address] Results: Creatinine 1.0 mg/dL (GFR 72) EXAM: MRI HEAD WITHOUT AND WITH CONTRAST TECHNIQUE: Multiplanar, multiecho pulse sequences of the brain and surrounding structures were obtained without and with intravenous contrast. CONTRAST:  25m MULTIHANCE GADOBENATE DIMEGLUMINE 529 MG/ML IV SOLN COMPARISON:  Prior brain MRI examinations 03/03/2019 and earlier FINDINGS: Brain: Mildly motion degraded postcontrast imaging. Redemonstrated sequela of prior right craniotomy for excision or biopsy. The resection cavity is stable in appearance with residual  enhancing tissue along the cavity margins. The cavity measures 2.3 x 3.5 cm (remeasured on prior) (measured on series 14, image 15 and series 15, image 5 on the current study). No significant interval change in diffuse white matter signal throughout the right hemisphere. No increasing mass effect. No significant interval change in the restricted diffusion pattern. No new enhancement. Unchanged sequela of chronic small vessel ischemic disease within the left cerebral  hemisphere. Redemonstrated wallerian degeneration of the right cerebral peduncle. Vascular: Flow voids maintained within the proximal large arterial vessels. Expected enhancement within the proximal large arterial vessels and dural venous sinuses. Skull and upper cervical spine: No focal marrow lesion. Sinuses/Orbits: Visualized orbits demonstrate no acute abnormality. Trace ethmoid sinus mucosal thickening. Redemonstrated chronically opacified left maxillary sinus. IMPRESSION: Stable examination as compared to MRI 03/03/2019. No progressive or worsening findings. Unchanged appearance of the right deep insular resection cavity. Marginal enhancement is stable. No new enhancement or developing mass effect. Diffuse right hemisphere white matter T2 hyperintensity is also unchanged. Electronically Signed   By: Kellie Simmering DO   On: 05/05/2019 13:12    Assessment/Plan 1.  Glioblastoma, IDH-wildtype [facility])   Mr. Cansler is clinically and radiographically stable today.    We recommended continuing MRI surveillance.  He should decrease Keppra to 1065m BID and con't Zonisamide 1082mBID for seizure prevention.  He should remain active with home physical therapy sessions, with particular attention to left hand.  We recommended continuing dexamethasone 82m57maily for appetite and energy.  We asked him to return to clinic in 2 months with MRI for evaluation.    We appreciate the opportunity to participate [facility] the [facility] The total  time spent in the encounter was 40 minutes and more than 50% was on counseling and review of test results   ZacVentura SellersD Medical Director of Neuro-Oncology [facility] [place]/15/21 12:00 PM

## 2019-05-09 ENCOUNTER — Telehealth: Payer: Self-pay | Admitting: Internal Medicine

## 2019-05-09 NOTE — Telephone Encounter (Signed)
Scheduled appt per 2/15 los. ° °Sent a message to HIM pool to get a calendar mailed out. ° °

## 2019-05-12 ENCOUNTER — Ambulatory Visit: Payer: Medicare Other

## 2019-05-24 ENCOUNTER — Ambulatory Visit: Payer: Medicare Other | Attending: Internal Medicine

## 2019-05-24 DIAGNOSIS — Z23 Encounter for immunization: Secondary | ICD-10-CM | POA: Insufficient documentation

## 2019-05-24 NOTE — Progress Notes (Signed)
   Covid-19 Vaccination Clinic  Name:  Bryan [name]    MRN: [id number] DOB: Oct 14, 1940  05/24/2019  [name] was observed post Covid-19 immunization for 15 minutes without incident. He was provided with Vaccine Information Sheet and instruction to access the V-Safe system.   [name] was instructed to call 911 with any severe reactions post vaccine: [name] Difficulty breathing  . Swelling of face and throat  . A fast heartbeat  . A bad rash all over body  . Dizziness and weakness   Immunizations Administered    Name Date Dose VIS Date Route   Pfizer COVID-19 Vaccine 05/24/2019  2:21 PM 0.3 mL 03/03/2019 Intramuscular   Manufacturer: [place]   Lot: [id number]   [place]: [id number]

## 2019-06-02 ENCOUNTER — Other Ambulatory Visit: Payer: Self-pay | Admitting: Radiation Therapy

## 2019-08-14 ENCOUNTER — Telehealth: Payer: Self-pay | Admitting: *Deleted

## 2019-08-14 NOTE — Telephone Encounter (Signed)
Patients wife called to report that patient is having worsening of symptoms with increased trouble walking, falling, and speech is becoming harder.  Appt is scheduled for tomorrow afternoon, just because the wife is having harder time getting him up and moving.

## 2019-08-15 ENCOUNTER — Other Ambulatory Visit: Payer: Self-pay

## 2019-08-15 ENCOUNTER — Inpatient Hospital Stay: Payer: Medicare Other | Attending: Internal Medicine | Admitting: Internal Medicine

## 2019-08-15 VITALS — BP 136/68 | HR 96 | Temp 97.7°F | Resp 18 | Ht 64.0 in | Wt 141.8 lb

## 2019-08-15 DIAGNOSIS — Z87891 Personal history of nicotine dependence: Secondary | ICD-10-CM | POA: Insufficient documentation

## 2019-08-15 DIAGNOSIS — Q043 Other reduction deformities of brain: Secondary | ICD-10-CM | POA: Diagnosis not present

## 2019-08-15 DIAGNOSIS — K219 Gastro-esophageal reflux disease without esophagitis: Secondary | ICD-10-CM | POA: Insufficient documentation

## 2019-08-15 DIAGNOSIS — I10 Essential (primary) hypertension: Secondary | ICD-10-CM | POA: Insufficient documentation

## 2019-08-15 DIAGNOSIS — Z923 Personal history of irradiation: Secondary | ICD-10-CM | POA: Diagnosis not present

## 2019-08-15 DIAGNOSIS — Z7984 Long term (current) use of oral hypoglycemic drugs: Secondary | ICD-10-CM | POA: Diagnosis not present

## 2019-08-15 DIAGNOSIS — Z85828 Personal history of other malignant neoplasm of skin: Secondary | ICD-10-CM | POA: Insufficient documentation

## 2019-08-15 DIAGNOSIS — E7800 Pure hypercholesterolemia, unspecified: Secondary | ICD-10-CM | POA: Insufficient documentation

## 2019-08-15 DIAGNOSIS — E079 Disorder of thyroid, unspecified: Secondary | ICD-10-CM | POA: Diagnosis not present

## 2019-08-15 DIAGNOSIS — Z79899 Other long term (current) drug therapy: Secondary | ICD-10-CM | POA: Diagnosis not present

## 2019-08-15 DIAGNOSIS — R531 Weakness: Secondary | ICD-10-CM | POA: Diagnosis not present

## 2019-08-15 DIAGNOSIS — E119 Type 2 diabetes mellitus without complications: Secondary | ICD-10-CM | POA: Insufficient documentation

## 2019-08-15 DIAGNOSIS — R4701 Aphasia: Secondary | ICD-10-CM | POA: Diagnosis not present

## 2019-08-15 DIAGNOSIS — C711 Malignant neoplasm of frontal lobe: Secondary | ICD-10-CM | POA: Insufficient documentation

## 2019-08-15 DIAGNOSIS — Z9221 Personal history of antineoplastic chemotherapy: Secondary | ICD-10-CM | POA: Insufficient documentation

## 2019-08-15 DIAGNOSIS — M1990 Unspecified osteoarthritis, unspecified site: Secondary | ICD-10-CM | POA: Insufficient documentation

## 2019-08-15 MED ORDER — DEXAMETHASONE 4 MG PO TABS: 4.0000 mg | ORAL_TABLET | Freq: Every day | ORAL | 3 refills | Status: DC

## 2019-08-15 NOTE — Progress Notes (Signed)
[facility] at [place], [place] [address] [phone]   Interval Evaluation  Date of Service: 08/15/19 Patient Name: Bryan [name] Patient MRN: [id number] Patient DOB: 05-20-1940 Provider: Ventura Sellers, MD  Identifying Statement:  [name] is a 79 y.o. male with right frontal glioblastoma   Oncologic History: Oncology History   Glioblastoma, IDH-wildtype ([place])   03/07/2018 Surgery   Craniotomy, resection by Dr. Tommi Rumps ([facility]).  Path is Glioblastoma   03/29/2018 - 04/04/2018 Chemotherapy   The patient had [No matching medication found in this treatment plan]  for chemotherapy treatment.    04/04/2018 - 05/13/2018 Radiation Therapy   IMRT and concurrent Temozolomide with Dr. Tammi Klippel   06/09/2018 - 01/02/2019 Chemotherapy   The patient completed 6 cycles of adjuvant 5-day Temozolomide       Biomarkers:  MGMT Unknown.  IDH 1/2 Wild type.  EGFR Expressed  TERT Unknown   Interval History: [name] presents today for follow up after recent clinical changes.  His wife describes clear progression of left sided weakness over the past few weeks.  He is completely dragging the left leg and has little use anymore for the left arm/hand.  He has had multiple falls in the home despite using a four-pointed walker.  Speaking/fluency is otherwise stable from prior.  Continues to take decadron 44m daily.  History is overall still limited by language impairments, wife assisted again with history.    H+P (02/16/18) [name] male presented after family noted drooping of the left side of his face yesterday.  In addition, he/they describe some cognitive changes, such as difficulty "finding words" in certain situations.  The patient also feels like his left leg is "lagging" a little behind his right lately.  He is walking on his own, however, and with no falls.  Denies headaches, seizures.   Medications: Current  Outpatient Medications on File Prior to Visit  Medication Sig Dispense Refill  . atorvastatin (LIPITOR) 40 MG tablet Take 1 tablet (40 mg total) by mouth daily. 30 tablet 0  . cetirizine-pseudoephedrine (ZYRTEC-D ALLERGY & CONGESTION) 5-120 MG tablet Take 1 tablet by mouth 2 (two) times daily.    . Cholecalciferol (D-3-5) 125 MCG (5000 UT) capsule Take 1 capsule by mouth daily.    . Cyanocobalamin (B-12) 1000 MCG LOZG Take 2 lozenges by mouth daily.    .Marland KitchenlevETIRAcetam (KEPPRA) 100 MG/ML solution Take 10 mLs (1,000 mg total) by mouth 2 (two) times daily. 473 mL 2  . levothyroxine (SYNTHROID, LEVOTHROID) 75 MCG tablet Take 1 tablet (75 mcg total) by mouth daily before breakfast. 30 tablet 0  . lisinopril (ZESTRIL) 10 MG tablet Take 1 tablet by mouth daily.    . metFORMIN (GLUCOPHAGE) 1000 MG tablet Take 1 tablet (1,000 mg total) by mouth 2 (two) times daily with a meal. 60 tablet 1  . naphazoline-pheniramine (NAPHCON-A) 0.025-0.3 % ophthalmic solution Place 1 drop into both eyes as needed for eye irritation or allergies.    . pantoprazole (PROTONIX) 40 MG tablet TAKE 1 TABLET EVERY DAY 90 tablet 3  . zonisamide (ZONEGRAN) 100 MG capsule Take 1 capsule (100 mg total) by mouth daily. 90 capsule 1  . acetaminophen (TYLENOL) 325 MG tablet Take 2 tablets (650 mg total) by mouth every 6 (six) hours as needed for mild pain. (Patient not taking: Reported on 08/15/2019)    . fluticasone (FLONASE) 50 MCG/ACT nasal spray Place 1 spray into both nostrils 2 (  two) times daily.     No current facility-administered medications on file prior to visit.    Allergies:  Allergies  Allergen Reactions  . Other     Cats, pollen   Past Medical History:  Past Medical History:  Diagnosis Date  . Arthritis   . Brain cancer ([place])    Glioblastoma  . Diabetes mellitus without complication ([place])   . GERD (gastroesophageal reflux disease)   . Hypercholesterolemia   . Hypertension   . Thyroid disease    Past  Surgical History:  Past Surgical History:  Procedure Laterality Date  . CRANIOTOMY    . HERNIA REPAIR    . 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 Marital status: Married    Spouse name: Not on file  . Number of children: Not on file  . Years of education: Not on file  . Highest education level: Not on file  Occupational History  . Not on file  Tobacco Use  . Smoking status: Former Smoker    Packs/day: 0.25    Years: 3.00    Pack years: 0.75    Types: Cigarettes    Quit date: 03/23/1961    Years since quitting: 58.4  . Smokeless tobacco: Never Used  Substance and Sexual Activity  . Alcohol use: Yes    Alcohol/week: 1.0 standard drinks    Types: 1 Glasses of wine per week    Comment: weekly  . Drug use: Never  . Sexual activity: Not Currently  Other Topics Concern  . Not on file  Social History Narrative   Married. Resides in [place].   Social Determinants of Health   Financial Resource Strain:   . Difficulty of Paying Living Expenses:   Food Insecurity:   . Worried About Charity fundraiser in the Last Year:   . Arboriculturist in the Last Year:   Transportation Needs:   . Film/video editor (Medical):   [name] Lack of Transportation (Non-Medical):   Physical Activity:   . Days of Exercise per Week:   . Minutes of Exercise per Session:   Stress:   . Feeling of Stress :   Social Connections:   . Frequency of Communication with Friends and Family:   . Frequency of Social Gatherings with Friends and Family:   . Attends Religious Services:   . Active Member of Clubs or Organizations:   . Attends Archivist Meetings:   [name] Marital Status:   Intimate Partner Violence:   . Fear of Current or Ex-Partner:   . Emotionally Abused:   [name] Physically Abused:   . Sexually Abused:    Family History:  Family History  Problem Relation Age of Onset  . Diabetes Mellitus II  Mother   . ALS Father     Review of Systems: Constitutional: Denies fevers, chills or abnormal weight loss Eyes: Denies blurriness of vision Ears, nose, mouth, throat, and face: Denies mucositis or sore throat Respiratory: Denies cough, dyspnea or wheezes Cardiovascular: Denies palpitation, chest discomfort or lower extremity swelling Gastrointestinal:  Denies nausea, constipation, diarrhea GU: Denies dysuria or incontinence Skin: Denies abnormal skin rashes Neurological: Per HPI Musculoskeletal: Denies joint pain, back or neck discomfort. No decrease in ROM Behavioral/Psych: Denies anxiety, disturbance in thought content, and mood instability  Physical Exam: Vitals:   08/15/19 1414  BP: 136/68  Pulse: 96  Resp: 18  Temp: 97.7 F (36.5 C)  SpO2: 99%   KPS: 70. General: Alert, cooperative, pleasant, in no acute distress Head: Craniotomy scar noted, dry and intact. EENT: No conjunctival injection or scleral icterus. Oral mucosa moist Lungs: Resp effort normal Cardiac: Regular rate and rhythm Abdomen: Soft, non-distended abdomen Skin: No rashes cyanosis or petechiae. Extremities: no edema  Neurologic Exam: Mental Status: Awake, alert, attentive to examiner. Oriented to self and environment. Language is fairly densely impaired with regards to fluency, comprehension and repetition.   Cranial Nerves: Visual acuity is grossly normal. Visual fields are full. Extra-ocular movements intact. No ptosis. L UMN facial paresis, tongue midline. Motor: Tone and bulk are normal. Power is 3/5 in left arm and 2/5 in leg, with absent fine motor function in left hand.  Left thenar atrophy noted. Reflexes are symmetric, no pathologic reflexes present. Intact finger to nose bilaterally Sensory: Intact to light touch and temperature Gait: Deferred  Labs: I have reviewed the data as listed    Component Value Date/Time   NA 141 03/06/2019 1026   K 4.1 03/06/2019 1026   CL 104 03/06/2019 1026     CO2 26 03/06/2019 1026   GLUCOSE 168 (H) 03/06/2019 1026   BUN 27 (H) 03/06/2019 1026   CREATININE 1.21 03/06/2019 1026   CALCIUM 9.4 03/06/2019 1026   PROT 6.4 (L) 03/06/2019 1026   ALBUMIN 3.9 03/06/2019 1026   AST 12 (L) 03/06/2019 1026   ALT 16 03/06/2019 1026   ALKPHOS 45 03/06/2019 1026   BILITOT 0.3 03/06/2019 1026   GFRNONAA 57 (L) 03/06/2019 1026   GFRAA >60 03/06/2019 1026   Lab Results  Component Value Date   WBC 5.7 03/06/2019   NEUTROABS 3.9 03/06/2019   HGB 11.9 (L) 03/06/2019   HCT 34.3 (L) 03/06/2019   MCV 99.4 03/06/2019   PLT 190 03/06/2019     Assessment/Plan 1.  Glioblastoma, IDH-wildtype [facility])   Mr. Morozov is clinically progressive today.  We spent some time discussing broader goals of care given concern for tumor progression.  They would like to remain aggressive with all options open, despite difficulties faced at home.    We recommended ASAP MRI for evaluation of disease status given last study performed ~3 months prior.  We asked him to increase decadron to 68m daily given progressive motor symptoms.  He should con't Keppra 10030mBID and con't Zonisamide 10058mID for seizure prevention.  We appreciate the opportunity to participate in the care of [facility]We will follow up with him soon after MRI is done.  The total time spent in the encounter was 40 minutes and more than 50% was on counseling and review of test results   ZacVentura SellersD Medical Director of Neuro-Oncology [facility] [place]/25/21 3:38 PM

## 2019-08-16 ENCOUNTER — Ambulatory Visit (HOSPITAL_COMMUNITY)
Admission: RE | Admit: 2019-08-16 | Discharge: 2019-08-16 | Disposition: A | Discharge status: Home / Self Care | Payer: Medicare Other | Source: Ambulatory Visit | Attending: Internal Medicine | Admitting: Internal Medicine

## 2019-08-16 DIAGNOSIS — C719 Malignant neoplasm of brain, unspecified: Secondary | ICD-10-CM | POA: Diagnosis not present

## 2019-08-16 DIAGNOSIS — C711 Malignant neoplasm of frontal lobe: Secondary | ICD-10-CM | POA: Insufficient documentation

## 2019-08-16 DIAGNOSIS — R29810 Facial weakness: Secondary | ICD-10-CM | POA: Diagnosis not present

## 2019-08-16 MED ORDER — GADOBUTROL 1 MMOL/ML IV SOLN
6.0000 mL | Freq: Once | INTRAVENOUS | Status: AC | PRN
  Administered 2019-08-16: 6 mL via INTRAVENOUS

## 2019-08-18 ENCOUNTER — Inpatient Hospital Stay (HOSPITAL_BASED_OUTPATIENT_CLINIC_OR_DEPARTMENT_OTHER): Payer: Medicare Other | Admitting: Internal Medicine

## 2019-08-18 DIAGNOSIS — C711 Malignant neoplasm of frontal lobe: Secondary | ICD-10-CM

## 2019-08-18 MED ORDER — DEXAMETHASONE 1 MG PO TABS: 1.0000 mg | ORAL_TABLET | Freq: Every day | ORAL | 3 refills | Status: DC

## 2019-08-18 NOTE — Progress Notes (Signed)
I connected with Bryan [name] on 08/18/19 at  2:00 PM EDT by telephone visit and verified that I am speaking with the correct person using two identifiers.  I discussed the limitations, risks, security and privacy concerns of performing an evaluation and management service by telemedicine and the availability of in-person appointments. I also discussed with the patient that there may be a patient responsible charge related to this service. The patient expressed understanding and agreed to proceed.  Other persons participating in the visit and their role in the encounter:  Wife  Patient's location:  Home  Provider's location:  Office  Chief Complaint:   Glioblastoma, IDH-wildtype ([place])   History of Present Ilness: Bryan [name] describes no new or progressive neurologic deficits. No significant change on the higher dose of decadron. Observations: Language and cognition stable  Imaging:  [facility] Clinician Interpretation: I have personally reviewed the CNS images as listed.  My interpretation, in the context of the patient's clinical presentation, is stable disease  MR BRAIN W WO CONTRAST  Result Date: 08/16/2019 CLINICAL DATA:  Glioblastoma, left-sided facial droop and cognitive changes reported by family EXAM: MRI HEAD WITHOUT AND WITH CONTRAST TECHNIQUE: Multiplanar, multiecho pulse sequences of the brain and surrounding structures were obtained without and with intravenous contrast. CONTRAST:  19m GADAVIST GADOBUTROL 1 MMOL/ML IV SOLN COMPARISON:  Multiple priors, most recent prior dated 05/05/2019 FINDINGS: Brain: Right frontal/insular resection cavity is again identified with chronic blood products and stable enhancement at the periphery without new nodularity. Diffuse surrounding T2 FLAIR hyperintensity is unchanged in extent. There is no new abnormal enhancement remote from this area. There is no acute infarction or hemorrhage. Ventricles are stable in size including ex vacuo dilatation of the  right lateral ventricle. Wallerian degeneration along the right cerebral peduncle. Nonspecific foci of T2 hyperintensity in the contralateral left cerebral hemisphere are unchanged. Vascular: Major vessel flow voids at the skull base are preserved. Skull and upper cervical spine: Normal marrow signal is preserved. Sinuses/Orbits: Chronic left maxillary sinus opacification. Orbits are unremarkable. Other: Sella is unremarkable.  Mastoid air cells are clear. IMPRESSION: No significant change since 05/05/2019. Stable enhancement and extent of abnormal T2 FLAIR signal (increased compared to more remote study 12/02/2018). Electronically Signed   By: PMacy MisM.D.   On: 08/16/2019 16:29    Assessment and Plan: Clinically and radiographically stable.  Should decrease decadron back to 156mdaily as prior.  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  I discussed the assessment and treatment plan with the patient.  The patient was provided an opportunity to ask questions and all were answered.  The patient agreed with the plan and demonstrated understanding of the instructions.    The patient was advised to call back or seek an in-person evaluation if the symptoms worsen or if the condition fails to improve as anticipated.  I provided 5-10 minutes of non-face-to-face time during this enocunter.  ZaVentura SellersMD   I provided 15 minutes of non face-to-face telephone visit time during this encounter, and > 50% was spent counseling as documented under my assessment & plan.

## 2019-08-22 ENCOUNTER — Telehealth: Payer: Self-pay | Admitting: Internal Medicine

## 2019-08-22 NOTE — Telephone Encounter (Signed)
Scheduled appt per 5/28 los.  Spoke with a household family member and they are aware of the appt date and time.

## 2019-09-08 ENCOUNTER — Other Ambulatory Visit: Payer: Medicare Other

## 2019-09-11 ENCOUNTER — Ambulatory Visit: Payer: Medicare Other | Admitting: Internal Medicine

## 2019-09-11 ENCOUNTER — Inpatient Hospital Stay: Payer: Medicare Other | Attending: Internal Medicine

## 2019-09-18 ENCOUNTER — Other Ambulatory Visit: Payer: Self-pay | Admitting: *Deleted

## 2019-09-18 ENCOUNTER — Telehealth: Payer: Self-pay | Admitting: *Deleted

## 2019-09-18 DIAGNOSIS — I10 Essential (primary) hypertension: Secondary | ICD-10-CM | POA: Diagnosis not present

## 2019-09-18 DIAGNOSIS — Z993 Dependence on wheelchair: Secondary | ICD-10-CM | POA: Diagnosis not present

## 2019-09-18 DIAGNOSIS — G8194 Hemiplegia, unspecified affecting left nondominant side: Secondary | ICD-10-CM | POA: Diagnosis not present

## 2019-09-18 DIAGNOSIS — Z7984 Long term (current) use of oral hypoglycemic drugs: Secondary | ICD-10-CM | POA: Diagnosis not present

## 2019-09-18 DIAGNOSIS — E079 Disorder of thyroid, unspecified: Secondary | ICD-10-CM | POA: Diagnosis not present

## 2019-09-18 DIAGNOSIS — C711 Malignant neoplasm of frontal lobe: Secondary | ICD-10-CM

## 2019-09-18 DIAGNOSIS — K219 Gastro-esophageal reflux disease without esophagitis: Secondary | ICD-10-CM | POA: Diagnosis not present

## 2019-09-18 DIAGNOSIS — Z87891 Personal history of nicotine dependence: Secondary | ICD-10-CM | POA: Diagnosis not present

## 2019-09-18 DIAGNOSIS — M1990 Unspecified osteoarthritis, unspecified site: Secondary | ICD-10-CM | POA: Diagnosis not present

## 2019-09-18 DIAGNOSIS — E7800 Pure hypercholesterolemia, unspecified: Secondary | ICD-10-CM | POA: Diagnosis not present

## 2019-09-18 DIAGNOSIS — R471 Dysarthria and anarthria: Secondary | ICD-10-CM | POA: Diagnosis not present

## 2019-09-18 DIAGNOSIS — Z9181 History of falling: Secondary | ICD-10-CM | POA: Diagnosis not present

## 2019-09-18 DIAGNOSIS — Z85828 Personal history of other malignant neoplasm of skin: Secondary | ICD-10-CM | POA: Diagnosis not present

## 2019-09-18 DIAGNOSIS — E119 Type 2 diabetes mellitus without complications: Secondary | ICD-10-CM | POA: Diagnosis not present

## 2019-09-18 DIAGNOSIS — R296 Repeated falls: Secondary | ICD-10-CM | POA: Diagnosis not present

## 2019-09-18 MED ORDER — ZONISAMIDE 100 MG PO CAPS: 100.0000 mg | ORAL_CAPSULE | Freq: Every day | ORAL | 0 refills | Status: DC

## 2019-09-18 NOTE — Telephone Encounter (Signed)
Patient called and is completely out of Zonisamide.  She processed refill request through [facility] but it will take 7-10 business days to process.    Can she get a two week worth of medication sent to [facility] on file?  Routed to MD.

## 2019-09-18 NOTE — Addendum Note (Signed)
Addended by: Ventura Sellers on: 09/18/2019 02:52 PM   Modules accepted: Orders

## 2019-09-18 NOTE — Progress Notes (Signed)
Spoke with Santiago Glad with [facility] regarding in home PT.

## 2019-09-19 ENCOUNTER — Telehealth: Payer: Self-pay | Admitting: *Deleted

## 2019-09-19 NOTE — Telephone Encounter (Signed)
Bryan [name] with [facility] [phone] called to confirm verbal orders for PT and OT.

## 2019-09-21 DIAGNOSIS — R471 Dysarthria and anarthria: Secondary | ICD-10-CM | POA: Diagnosis not present

## 2019-09-21 DIAGNOSIS — G8194 Hemiplegia, unspecified affecting left nondominant side: Secondary | ICD-10-CM | POA: Diagnosis not present

## 2019-09-21 DIAGNOSIS — E119 Type 2 diabetes mellitus without complications: Secondary | ICD-10-CM | POA: Diagnosis not present

## 2019-09-21 DIAGNOSIS — C711 Malignant neoplasm of frontal lobe: Secondary | ICD-10-CM | POA: Diagnosis not present

## 2019-09-21 DIAGNOSIS — M1990 Unspecified osteoarthritis, unspecified site: Secondary | ICD-10-CM | POA: Diagnosis not present

## 2019-09-21 DIAGNOSIS — R296 Repeated falls: Secondary | ICD-10-CM | POA: Diagnosis not present

## 2019-09-27 DIAGNOSIS — R296 Repeated falls: Secondary | ICD-10-CM | POA: Diagnosis not present

## 2019-09-27 DIAGNOSIS — E119 Type 2 diabetes mellitus without complications: Secondary | ICD-10-CM | POA: Diagnosis not present

## 2019-09-27 DIAGNOSIS — R471 Dysarthria and anarthria: Secondary | ICD-10-CM | POA: Diagnosis not present

## 2019-09-27 DIAGNOSIS — G8194 Hemiplegia, unspecified affecting left nondominant side: Secondary | ICD-10-CM | POA: Diagnosis not present

## 2019-09-27 DIAGNOSIS — C711 Malignant neoplasm of frontal lobe: Secondary | ICD-10-CM | POA: Diagnosis not present

## 2019-09-27 DIAGNOSIS — M1990 Unspecified osteoarthritis, unspecified site: Secondary | ICD-10-CM | POA: Diagnosis not present

## 2019-09-28 DIAGNOSIS — G8194 Hemiplegia, unspecified affecting left nondominant side: Secondary | ICD-10-CM | POA: Diagnosis not present

## 2019-09-28 DIAGNOSIS — E119 Type 2 diabetes mellitus without complications: Secondary | ICD-10-CM | POA: Diagnosis not present

## 2019-09-28 DIAGNOSIS — C711 Malignant neoplasm of frontal lobe: Secondary | ICD-10-CM | POA: Diagnosis not present

## 2019-09-28 DIAGNOSIS — R296 Repeated falls: Secondary | ICD-10-CM | POA: Diagnosis not present

## 2019-09-28 DIAGNOSIS — M1990 Unspecified osteoarthritis, unspecified site: Secondary | ICD-10-CM | POA: Diagnosis not present

## 2019-09-28 DIAGNOSIS — R471 Dysarthria and anarthria: Secondary | ICD-10-CM | POA: Diagnosis not present

## 2019-10-02 DIAGNOSIS — C711 Malignant neoplasm of frontal lobe: Secondary | ICD-10-CM | POA: Diagnosis not present

## 2019-10-02 DIAGNOSIS — R296 Repeated falls: Secondary | ICD-10-CM | POA: Diagnosis not present

## 2019-10-02 DIAGNOSIS — R471 Dysarthria and anarthria: Secondary | ICD-10-CM | POA: Diagnosis not present

## 2019-10-02 DIAGNOSIS — E119 Type 2 diabetes mellitus without complications: Secondary | ICD-10-CM | POA: Diagnosis not present

## 2019-10-02 DIAGNOSIS — G8194 Hemiplegia, unspecified affecting left nondominant side: Secondary | ICD-10-CM | POA: Diagnosis not present

## 2019-10-02 DIAGNOSIS — M1990 Unspecified osteoarthritis, unspecified site: Secondary | ICD-10-CM | POA: Diagnosis not present

## 2019-10-04 DIAGNOSIS — E119 Type 2 diabetes mellitus without complications: Secondary | ICD-10-CM | POA: Diagnosis not present

## 2019-10-04 DIAGNOSIS — R296 Repeated falls: Secondary | ICD-10-CM | POA: Diagnosis not present

## 2019-10-04 DIAGNOSIS — C711 Malignant neoplasm of frontal lobe: Secondary | ICD-10-CM | POA: Diagnosis not present

## 2019-10-04 DIAGNOSIS — M1990 Unspecified osteoarthritis, unspecified site: Secondary | ICD-10-CM | POA: Diagnosis not present

## 2019-10-04 DIAGNOSIS — G8194 Hemiplegia, unspecified affecting left nondominant side: Secondary | ICD-10-CM | POA: Diagnosis not present

## 2019-10-04 DIAGNOSIS — R471 Dysarthria and anarthria: Secondary | ICD-10-CM | POA: Diagnosis not present

## 2019-10-05 DIAGNOSIS — E538 Deficiency of other specified B group vitamins: Secondary | ICD-10-CM | POA: Diagnosis not present

## 2019-10-05 DIAGNOSIS — C719 Malignant neoplasm of brain, unspecified: Secondary | ICD-10-CM | POA: Diagnosis not present

## 2019-10-05 DIAGNOSIS — R7301 Impaired fasting glucose: Secondary | ICD-10-CM | POA: Diagnosis not present

## 2019-10-05 DIAGNOSIS — N1831 Chronic kidney disease, stage 3a: Secondary | ICD-10-CM | POA: Diagnosis not present

## 2019-10-05 DIAGNOSIS — E038 Other specified hypothyroidism: Secondary | ICD-10-CM | POA: Diagnosis not present

## 2019-10-05 DIAGNOSIS — E7849 Other hyperlipidemia: Secondary | ICD-10-CM | POA: Diagnosis not present

## 2019-10-05 DIAGNOSIS — I2510 Atherosclerotic heart disease of native coronary artery without angina pectoris: Secondary | ICD-10-CM | POA: Diagnosis not present

## 2019-10-05 DIAGNOSIS — I700 Atherosclerosis of aorta: Secondary | ICD-10-CM | POA: Diagnosis not present

## 2019-10-05 DIAGNOSIS — D6481 Anemia due to antineoplastic chemotherapy: Secondary | ICD-10-CM | POA: Diagnosis not present

## 2019-10-05 DIAGNOSIS — E119 Type 2 diabetes mellitus without complications: Secondary | ICD-10-CM | POA: Diagnosis not present

## 2019-10-05 DIAGNOSIS — E559 Vitamin D deficiency, unspecified: Secondary | ICD-10-CM | POA: Diagnosis not present

## 2019-10-05 DIAGNOSIS — R569 Unspecified convulsions: Secondary | ICD-10-CM | POA: Diagnosis not present

## 2019-10-06 DIAGNOSIS — M1990 Unspecified osteoarthritis, unspecified site: Secondary | ICD-10-CM | POA: Diagnosis not present

## 2019-10-06 DIAGNOSIS — C711 Malignant neoplasm of frontal lobe: Secondary | ICD-10-CM | POA: Diagnosis not present

## 2019-10-06 DIAGNOSIS — R296 Repeated falls: Secondary | ICD-10-CM | POA: Diagnosis not present

## 2019-10-06 DIAGNOSIS — E119 Type 2 diabetes mellitus without complications: Secondary | ICD-10-CM | POA: Diagnosis not present

## 2019-10-06 DIAGNOSIS — G8194 Hemiplegia, unspecified affecting left nondominant side: Secondary | ICD-10-CM | POA: Diagnosis not present

## 2019-10-06 DIAGNOSIS — R471 Dysarthria and anarthria: Secondary | ICD-10-CM | POA: Diagnosis not present

## 2019-10-09 DIAGNOSIS — R296 Repeated falls: Secondary | ICD-10-CM | POA: Diagnosis not present

## 2019-10-09 DIAGNOSIS — M1990 Unspecified osteoarthritis, unspecified site: Secondary | ICD-10-CM | POA: Diagnosis not present

## 2019-10-09 DIAGNOSIS — R471 Dysarthria and anarthria: Secondary | ICD-10-CM | POA: Diagnosis not present

## 2019-10-09 DIAGNOSIS — G8194 Hemiplegia, unspecified affecting left nondominant side: Secondary | ICD-10-CM | POA: Diagnosis not present

## 2019-10-09 DIAGNOSIS — C711 Malignant neoplasm of frontal lobe: Secondary | ICD-10-CM | POA: Diagnosis not present

## 2019-10-09 DIAGNOSIS — E119 Type 2 diabetes mellitus without complications: Secondary | ICD-10-CM | POA: Diagnosis not present

## 2019-10-10 DIAGNOSIS — E119 Type 2 diabetes mellitus without complications: Secondary | ICD-10-CM | POA: Diagnosis not present

## 2019-10-10 DIAGNOSIS — M1990 Unspecified osteoarthritis, unspecified site: Secondary | ICD-10-CM | POA: Diagnosis not present

## 2019-10-10 DIAGNOSIS — C711 Malignant neoplasm of frontal lobe: Secondary | ICD-10-CM | POA: Diagnosis not present

## 2019-10-10 DIAGNOSIS — G8194 Hemiplegia, unspecified affecting left nondominant side: Secondary | ICD-10-CM | POA: Diagnosis not present

## 2019-10-10 DIAGNOSIS — R471 Dysarthria and anarthria: Secondary | ICD-10-CM | POA: Diagnosis not present

## 2019-10-10 DIAGNOSIS — R296 Repeated falls: Secondary | ICD-10-CM | POA: Diagnosis not present

## 2019-10-11 DIAGNOSIS — C711 Malignant neoplasm of frontal lobe: Secondary | ICD-10-CM | POA: Diagnosis not present

## 2019-10-11 DIAGNOSIS — R296 Repeated falls: Secondary | ICD-10-CM | POA: Diagnosis not present

## 2019-10-11 DIAGNOSIS — G8194 Hemiplegia, unspecified affecting left nondominant side: Secondary | ICD-10-CM | POA: Diagnosis not present

## 2019-10-11 DIAGNOSIS — E119 Type 2 diabetes mellitus without complications: Secondary | ICD-10-CM | POA: Diagnosis not present

## 2019-10-11 DIAGNOSIS — M1990 Unspecified osteoarthritis, unspecified site: Secondary | ICD-10-CM | POA: Diagnosis not present

## 2019-10-11 DIAGNOSIS — R471 Dysarthria and anarthria: Secondary | ICD-10-CM | POA: Diagnosis not present

## 2019-10-12 ENCOUNTER — Telehealth: Payer: Self-pay | Admitting: *Deleted

## 2019-10-12 ENCOUNTER — Other Ambulatory Visit: Payer: Self-pay | Admitting: Radiation Therapy

## 2019-10-12 DIAGNOSIS — R471 Dysarthria and anarthria: Secondary | ICD-10-CM | POA: Diagnosis not present

## 2019-10-12 DIAGNOSIS — G8194 Hemiplegia, unspecified affecting left nondominant side: Secondary | ICD-10-CM | POA: Diagnosis not present

## 2019-10-12 DIAGNOSIS — R296 Repeated falls: Secondary | ICD-10-CM | POA: Diagnosis not present

## 2019-10-12 DIAGNOSIS — E119 Type 2 diabetes mellitus without complications: Secondary | ICD-10-CM | POA: Diagnosis not present

## 2019-10-12 DIAGNOSIS — M1990 Unspecified osteoarthritis, unspecified site: Secondary | ICD-10-CM | POA: Diagnosis not present

## 2019-10-12 DIAGNOSIS — C711 Malignant neoplasm of frontal lobe: Secondary | ICD-10-CM | POA: Diagnosis not present

## 2019-10-12 NOTE — Telephone Encounter (Signed)
Patients wife called to advise that Henderson Newcomer the Occupational Therapist from [facility] is recommending swallowing study based on increased coughing and drainage.  Manuela Schwartz called PCP Dr. Forde Dandy to request.  Dr. Forde Dandy is out of the office presently and on call doctor wanted to make sure we were aware of changes, and that follow up is lined up with [place].  Advised patient is scheduled for follow up already and that Dr. Mickeal Skinner is agreeable for swallow study to be ordered.    Pending Dr. Baldwin Crown return to office to advise if he will proceed with ordering of swallow study.

## 2019-10-16 DIAGNOSIS — M1990 Unspecified osteoarthritis, unspecified site: Secondary | ICD-10-CM | POA: Diagnosis not present

## 2019-10-16 DIAGNOSIS — G8194 Hemiplegia, unspecified affecting left nondominant side: Secondary | ICD-10-CM | POA: Diagnosis not present

## 2019-10-16 DIAGNOSIS — E119 Type 2 diabetes mellitus without complications: Secondary | ICD-10-CM | POA: Diagnosis not present

## 2019-10-16 DIAGNOSIS — C711 Malignant neoplasm of frontal lobe: Secondary | ICD-10-CM | POA: Diagnosis not present

## 2019-10-16 DIAGNOSIS — R471 Dysarthria and anarthria: Secondary | ICD-10-CM | POA: Diagnosis not present

## 2019-10-16 DIAGNOSIS — R296 Repeated falls: Secondary | ICD-10-CM | POA: Diagnosis not present

## 2019-10-18 DIAGNOSIS — Z7984 Long term (current) use of oral hypoglycemic drugs: Secondary | ICD-10-CM | POA: Diagnosis not present

## 2019-10-18 DIAGNOSIS — G8194 Hemiplegia, unspecified affecting left nondominant side: Secondary | ICD-10-CM | POA: Diagnosis not present

## 2019-10-18 DIAGNOSIS — M1990 Unspecified osteoarthritis, unspecified site: Secondary | ICD-10-CM | POA: Diagnosis not present

## 2019-10-18 DIAGNOSIS — Z87891 Personal history of nicotine dependence: Secondary | ICD-10-CM | POA: Diagnosis not present

## 2019-10-18 DIAGNOSIS — E079 Disorder of thyroid, unspecified: Secondary | ICD-10-CM | POA: Diagnosis not present

## 2019-10-18 DIAGNOSIS — R296 Repeated falls: Secondary | ICD-10-CM | POA: Diagnosis not present

## 2019-10-18 DIAGNOSIS — E119 Type 2 diabetes mellitus without complications: Secondary | ICD-10-CM | POA: Diagnosis not present

## 2019-10-18 DIAGNOSIS — Z9181 History of falling: Secondary | ICD-10-CM | POA: Diagnosis not present

## 2019-10-18 DIAGNOSIS — E7800 Pure hypercholesterolemia, unspecified: Secondary | ICD-10-CM | POA: Diagnosis not present

## 2019-10-18 DIAGNOSIS — Z993 Dependence on wheelchair: Secondary | ICD-10-CM | POA: Diagnosis not present

## 2019-10-18 DIAGNOSIS — R471 Dysarthria and anarthria: Secondary | ICD-10-CM | POA: Diagnosis not present

## 2019-10-18 DIAGNOSIS — Z85828 Personal history of other malignant neoplasm of skin: Secondary | ICD-10-CM | POA: Diagnosis not present

## 2019-10-18 DIAGNOSIS — I10 Essential (primary) hypertension: Secondary | ICD-10-CM | POA: Diagnosis not present

## 2019-10-18 DIAGNOSIS — C711 Malignant neoplasm of frontal lobe: Secondary | ICD-10-CM | POA: Diagnosis not present

## 2019-10-18 DIAGNOSIS — K219 Gastro-esophageal reflux disease without esophagitis: Secondary | ICD-10-CM | POA: Diagnosis not present

## 2019-10-19 ENCOUNTER — Telehealth: Payer: Self-pay | Admitting: *Deleted

## 2019-10-19 DIAGNOSIS — M1990 Unspecified osteoarthritis, unspecified site: Secondary | ICD-10-CM | POA: Diagnosis not present

## 2019-10-19 DIAGNOSIS — G8194 Hemiplegia, unspecified affecting left nondominant side: Secondary | ICD-10-CM | POA: Diagnosis not present

## 2019-10-19 DIAGNOSIS — E119 Type 2 diabetes mellitus without complications: Secondary | ICD-10-CM | POA: Diagnosis not present

## 2019-10-19 DIAGNOSIS — C711 Malignant neoplasm of frontal lobe: Secondary | ICD-10-CM | POA: Diagnosis not present

## 2019-10-19 DIAGNOSIS — R296 Repeated falls: Secondary | ICD-10-CM | POA: Diagnosis not present

## 2019-10-19 DIAGNOSIS — R471 Dysarthria and anarthria: Secondary | ICD-10-CM | POA: Diagnosis not present

## 2019-10-19 NOTE — Telephone Encounter (Signed)
Received call from Dr. Baldwin Crown office.  He does not want to proceed with swallowing study, states that due to the quality of life and the stage in his disease process he does not recommend it.

## 2019-10-21 ENCOUNTER — Other Ambulatory Visit: Payer: Self-pay | Admitting: Internal Medicine

## 2019-10-23 DIAGNOSIS — E119 Type 2 diabetes mellitus without complications: Secondary | ICD-10-CM | POA: Diagnosis not present

## 2019-10-23 DIAGNOSIS — R471 Dysarthria and anarthria: Secondary | ICD-10-CM | POA: Diagnosis not present

## 2019-10-23 DIAGNOSIS — R296 Repeated falls: Secondary | ICD-10-CM | POA: Diagnosis not present

## 2019-10-23 DIAGNOSIS — C711 Malignant neoplasm of frontal lobe: Secondary | ICD-10-CM | POA: Diagnosis not present

## 2019-10-23 DIAGNOSIS — M1990 Unspecified osteoarthritis, unspecified site: Secondary | ICD-10-CM | POA: Diagnosis not present

## 2019-10-23 DIAGNOSIS — G8194 Hemiplegia, unspecified affecting left nondominant side: Secondary | ICD-10-CM | POA: Diagnosis not present

## 2019-10-25 DIAGNOSIS — R471 Dysarthria and anarthria: Secondary | ICD-10-CM | POA: Diagnosis not present

## 2019-10-25 DIAGNOSIS — E119 Type 2 diabetes mellitus without complications: Secondary | ICD-10-CM | POA: Diagnosis not present

## 2019-10-25 DIAGNOSIS — M1990 Unspecified osteoarthritis, unspecified site: Secondary | ICD-10-CM | POA: Diagnosis not present

## 2019-10-25 DIAGNOSIS — C711 Malignant neoplasm of frontal lobe: Secondary | ICD-10-CM | POA: Diagnosis not present

## 2019-10-25 DIAGNOSIS — R296 Repeated falls: Secondary | ICD-10-CM | POA: Diagnosis not present

## 2019-10-25 DIAGNOSIS — G8194 Hemiplegia, unspecified affecting left nondominant side: Secondary | ICD-10-CM | POA: Diagnosis not present

## 2019-10-26 DIAGNOSIS — G8194 Hemiplegia, unspecified affecting left nondominant side: Secondary | ICD-10-CM | POA: Diagnosis not present

## 2019-10-26 DIAGNOSIS — M1990 Unspecified osteoarthritis, unspecified site: Secondary | ICD-10-CM | POA: Diagnosis not present

## 2019-10-26 DIAGNOSIS — R296 Repeated falls: Secondary | ICD-10-CM | POA: Diagnosis not present

## 2019-10-26 DIAGNOSIS — E119 Type 2 diabetes mellitus without complications: Secondary | ICD-10-CM | POA: Diagnosis not present

## 2019-10-26 DIAGNOSIS — R471 Dysarthria and anarthria: Secondary | ICD-10-CM | POA: Diagnosis not present

## 2019-10-26 DIAGNOSIS — C711 Malignant neoplasm of frontal lobe: Secondary | ICD-10-CM | POA: Diagnosis not present

## 2019-10-30 DIAGNOSIS — R296 Repeated falls: Secondary | ICD-10-CM | POA: Diagnosis not present

## 2019-10-30 DIAGNOSIS — C711 Malignant neoplasm of frontal lobe: Secondary | ICD-10-CM | POA: Diagnosis not present

## 2019-10-30 DIAGNOSIS — G8194 Hemiplegia, unspecified affecting left nondominant side: Secondary | ICD-10-CM | POA: Diagnosis not present

## 2019-10-30 DIAGNOSIS — R471 Dysarthria and anarthria: Secondary | ICD-10-CM | POA: Diagnosis not present

## 2019-10-30 DIAGNOSIS — M1990 Unspecified osteoarthritis, unspecified site: Secondary | ICD-10-CM | POA: Diagnosis not present

## 2019-10-30 DIAGNOSIS — E119 Type 2 diabetes mellitus without complications: Secondary | ICD-10-CM | POA: Diagnosis not present

## 2019-11-02 DIAGNOSIS — G8194 Hemiplegia, unspecified affecting left nondominant side: Secondary | ICD-10-CM | POA: Diagnosis not present

## 2019-11-02 DIAGNOSIS — R471 Dysarthria and anarthria: Secondary | ICD-10-CM | POA: Diagnosis not present

## 2019-11-02 DIAGNOSIS — C711 Malignant neoplasm of frontal lobe: Secondary | ICD-10-CM | POA: Diagnosis not present

## 2019-11-02 DIAGNOSIS — M1990 Unspecified osteoarthritis, unspecified site: Secondary | ICD-10-CM | POA: Diagnosis not present

## 2019-11-02 DIAGNOSIS — E119 Type 2 diabetes mellitus without complications: Secondary | ICD-10-CM | POA: Diagnosis not present

## 2019-11-02 DIAGNOSIS — R296 Repeated falls: Secondary | ICD-10-CM | POA: Diagnosis not present

## 2019-11-05 ENCOUNTER — Ambulatory Visit
Admission: RE | Admit: 2019-11-05 | Discharge: 2019-11-05 | Disposition: A | Discharge status: Home / Self Care | Payer: Medicare Other | Source: Ambulatory Visit | Attending: Internal Medicine | Admitting: Internal Medicine

## 2019-11-05 DIAGNOSIS — J320 Chronic maxillary sinusitis: Secondary | ICD-10-CM | POA: Diagnosis not present

## 2019-11-05 DIAGNOSIS — Z85841 Personal history of malignant neoplasm of brain: Secondary | ICD-10-CM | POA: Diagnosis not present

## 2019-11-05 DIAGNOSIS — C711 Malignant neoplasm of frontal lobe: Secondary | ICD-10-CM

## 2019-11-05 DIAGNOSIS — G9389 Other specified disorders of brain: Secondary | ICD-10-CM | POA: Diagnosis not present

## 2019-11-05 MED ORDER — GADOBENATE DIMEGLUMINE 529 MG/ML IV SOLN
13.0000 mL | Freq: Once | INTRAVENOUS | Status: AC | PRN
  Administered 2019-11-05: 13 mL via INTRAVENOUS

## 2019-11-06 ENCOUNTER — Other Ambulatory Visit: Payer: Self-pay

## 2019-11-06 ENCOUNTER — Inpatient Hospital Stay: Payer: Medicare Other

## 2019-11-06 ENCOUNTER — Inpatient Hospital Stay: Payer: Medicare Other | Attending: Internal Medicine | Admitting: Internal Medicine

## 2019-11-06 VITALS — BP 124/66 | HR 89 | Temp 97.3°F | Resp 18 | Ht 64.0 in | Wt 135.5 lb

## 2019-11-06 DIAGNOSIS — Z923 Personal history of irradiation: Secondary | ICD-10-CM | POA: Insufficient documentation

## 2019-11-06 DIAGNOSIS — E119 Type 2 diabetes mellitus without complications: Secondary | ICD-10-CM | POA: Insufficient documentation

## 2019-11-06 DIAGNOSIS — Z9221 Personal history of antineoplastic chemotherapy: Secondary | ICD-10-CM | POA: Insufficient documentation

## 2019-11-06 DIAGNOSIS — C711 Malignant neoplasm of frontal lobe: Secondary | ICD-10-CM | POA: Diagnosis not present

## 2019-11-06 DIAGNOSIS — K219 Gastro-esophageal reflux disease without esophagitis: Secondary | ICD-10-CM | POA: Diagnosis not present

## 2019-11-06 DIAGNOSIS — Z7952 Long term (current) use of systemic steroids: Secondary | ICD-10-CM | POA: Insufficient documentation

## 2019-11-06 DIAGNOSIS — Z7984 Long term (current) use of oral hypoglycemic drugs: Secondary | ICD-10-CM | POA: Insufficient documentation

## 2019-11-06 DIAGNOSIS — E7800 Pure hypercholesterolemia, unspecified: Secondary | ICD-10-CM | POA: Diagnosis not present

## 2019-11-06 DIAGNOSIS — I10 Essential (primary) hypertension: Secondary | ICD-10-CM | POA: Insufficient documentation

## 2019-11-06 DIAGNOSIS — Z79899 Other long term (current) drug therapy: Secondary | ICD-10-CM | POA: Insufficient documentation

## 2019-11-06 DIAGNOSIS — Z87891 Personal history of nicotine dependence: Secondary | ICD-10-CM | POA: Insufficient documentation

## 2019-11-06 DIAGNOSIS — Z85828 Personal history of other malignant neoplasm of skin: Secondary | ICD-10-CM | POA: Diagnosis not present

## 2019-11-06 DIAGNOSIS — E079 Disorder of thyroid, unspecified: Secondary | ICD-10-CM | POA: Insufficient documentation

## 2019-11-06 NOTE — Progress Notes (Signed)
[name] at [address] [place], [place] [address] [phone]   Interval Evaluation  Date of Service: 11/06/19 Patient Name: Bryan [name] Patient MRN: [id number] Patient DOB: August 23, 1940 Provider: Ventura Sellers, MD  Identifying Statement:  Bryan [name] is a 79 y.o. male with right frontal glioblastoma   Oncologic History: Oncology History   Glioblastoma, IDH-wildtype ([place])   03/07/2018 Surgery   Craniotomy, resection by Dr. Tommi Rumps ([facility]).  Path is Glioblastoma   03/29/2018 - 04/04/2018 Chemotherapy   The patient had [No matching medication found in this treatment plan]  for chemotherapy treatment.    04/04/2018 - 05/13/2018 Radiation Therapy   IMRT and concurrent Temozolomide with Dr. Tammi Klippel   06/09/2018 - 01/02/2019 Chemotherapy   The patient completed 6 cycles of adjuvant 5-day Temozolomide       Biomarkers:  MGMT Unknown.  IDH 1/2 Wild type.  EGFR Expressed  TERT Unknown   Interval History: Bryan [name] presents today for follow up after MRI brain.  His wife describes overall stability of left sided weakness over past few months.  He continues to drag the left leg and minimally uses left arm/hand.  No further falls, continues to use the walker at home.  Speaking/fluency is otherwise stable from prior.  Continues to take decadron 63m daily.  History is overall still limited by language impairments, wife assisted again with history.    H+P (02/16/18) [name]89y.o. male presented after family noted drooping of the left side of his face yesterday.  In addition, he/they describe some cognitive changes, such as difficulty "finding words" in certain situations.  The patient also feels like his left leg is "lagging" a little behind his right lately.  He is walking on his own, however, and with no falls.  Denies headaches, seizures.   Medications: Current Outpatient Medications on File Prior to Visit  Medication Sig  Dispense Refill   acetaminophen (TYLENOL) 325 MG tablet Take 2 tablets (650 mg total) by mouth every 6 (six) hours as needed for mild pain. (Patient not taking: Reported on 08/15/2019)     atorvastatin (LIPITOR) 40 MG tablet Take 1 tablet (40 mg total) by mouth daily. 30 tablet 0   cetirizine-pseudoephedrine (ZYRTEC-D ALLERGY & CONGESTION) 5-120 MG tablet Take 1 tablet by mouth 2 (two) times daily.     Cholecalciferol (D-3-5) 125 MCG (5000 UT) capsule Take 1 capsule by mouth daily.     Cyanocobalamin (B-12) 1000 MCG LOZG Take 2 lozenges by mouth daily.     dexamethasone (DECADRON) 1 MG tablet TAKE 1 TABLET EVERY DAY 90 tablet 1   fluticasone (FLONASE) 50 MCG/ACT nasal spray Place 1 spray into both nostrils 2 (two) times daily.     levETIRAcetam (KEPPRA) 100 MG/ML solution Take 10 mLs (1,000 mg total) by mouth 2 (two) times daily. 473 mL 2   levothyroxine (SYNTHROID, LEVOTHROID) 75 MCG tablet Take 1 tablet (75 mcg total) by mouth daily before breakfast. 30 tablet 0   lisinopril (ZESTRIL) 10 MG tablet Take 1 tablet by mouth daily.     metFORMIN (GLUCOPHAGE) 1000 MG tablet Take 1 tablet (1,000 mg total) by mouth 2 (two) times daily with a meal. 60 tablet 1   naphazoline-pheniramine (NAPHCON-A) 0.025-0.3 % ophthalmic solution Place 1 drop into both eyes as needed for eye irritation or allergies.     pantoprazole (PROTONIX) 40 MG tablet TAKE 1 TABLET EVERY DAY 90 tablet 3   zonisamide (ZONEGRAN) 100 MG capsule Take  1 capsule (100 mg total) by mouth daily. 30 capsule 0   No current facility-administered medications on file prior to visit.    Allergies:  Allergies  Allergen Reactions   Other     Cats, pollen   Past Medical History:  Past Medical History:  Diagnosis Date   Arthritis    Brain cancer ([place])    Glioblastoma   Diabetes mellitus without complication (HCC)    GERD (gastroesophageal reflux disease)    Hypercholesterolemia    Hypertension    Thyroid disease     Past Surgical History:  Past Surgical History:  Procedure Laterality Date   CRANIOTOMY     HERNIA REPAIR     squamous cell skin 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     Social History:  Social History   Socioeconomic History   Marital status: Married    Spouse name: Not on file   Number of children: Not on file   Years of education: Not on file   Highest education level: Not on file  Occupational History   Not on file  Tobacco Use   Smoking status: Former Smoker    Packs/day: 0.25    Years: 3.00    Pack years: 0.75    Types: Cigarettes    Quit date: 03/23/1961    Years since quitting: 58.6   Smokeless tobacco: Never Used  Vaping Use   Vaping Use: Never used  Substance and Sexual Activity   Alcohol use: Yes    Alcohol/week: 1.0 standard drink    Types: 1 Glasses of wine per week    Comment: weekly   Drug use: Never   Sexual activity: Not Currently  Other Topics Concern   Not on file  Social History Narrative   Married. Resides in [place].   Social Determinants of Health   Financial Resource Strain:    Difficulty of Paying Living Expenses:   Food Insecurity:    Worried About Charity fundraiser in the Last Year:    Arboriculturist in the Last Year:   Transportation Needs:    Film/video editor (Medical):    Lack of Transportation (Non-Medical):   Physical Activity:    Days of Exercise per Week:    Minutes of Exercise per Session:   Stress:    Feeling of Stress :   Social Connections:    Frequency of Communication with Friends and Family:    Frequency of Social Gatherings with Friends and Family:    Attends Religious Services:    Active Member of Clubs or Organizations:    Attends Music therapist:    Marital Status:   Intimate Partner Violence:    Fear of Current or Ex-Partner:    Emotionally Abused:    Physically Abused:    Sexually Abused:    Family History:  Family History  Problem  Relation Age of Onset   Diabetes Mellitus II Mother    ALS Father     Review of Systems: Constitutional: Denies fevers, chills or abnormal weight loss Eyes: Denies blurriness of vision Ears, nose, mouth, throat, and face: Denies mucositis or sore throat Respiratory: Denies cough, dyspnea or wheezes Cardiovascular: Denies palpitation, chest discomfort or lower extremity swelling Gastrointestinal:  Denies nausea, constipation, diarrhea GU: Denies dysuria or incontinence Skin: Denies abnormal skin rashes Neurological: Per HPI Musculoskeletal: Denies joint pain, back or neck discomfort. No decrease in ROM Behavioral/Psych: Denies anxiety, disturbance in thought content, and mood instability  Physical  Exam: Vitals:   11/06/19 1300  BP: 124/66  Pulse: 89  Resp: 18  Temp: (!) 97.3 F (36.3 C)  SpO2: 98%   KPS: 70. General: Alert, cooperative, pleasant, in no acute distress Head: Craniotomy scar noted, dry and intact. EENT: No conjunctival injection or scleral icterus. Oral mucosa moist Lungs: Resp effort normal Cardiac: Regular rate and rhythm Abdomen: Soft, non-distended abdomen Skin: No rashes cyanosis or petechiae. Extremities: no edema  Neurologic Exam: Mental Status: Awake, alert, attentive to examiner. Oriented to self and environment. Language is fairly densely impaired with regards to fluency, comprehension and repetition.   Cranial Nerves: Visual acuity is grossly normal. Visual fields are full. Extra-ocular movements intact. No ptosis. L UMN facial paresis, tongue midline. Motor: Tone and bulk are normal. Power is 3/5 in left arm and 2/5 in leg, with absent fine motor function in left hand.  Left thenar atrophy noted. Reflexes are symmetric, no pathologic reflexes present. Intact finger to nose bilaterally Sensory: Intact to light touch and temperature Gait: Deferred  Labs: I have reviewed the data as listed    Component Value Date/Time   NA 141 03/06/2019 1026     K 4.1 03/06/2019 1026   CL 104 03/06/2019 1026   CO2 26 03/06/2019 1026   GLUCOSE 168 (H) 03/06/2019 1026   BUN 27 (H) 03/06/2019 1026   CREATININE 1.21 03/06/2019 1026   CALCIUM 9.4 03/06/2019 1026   PROT 6.4 (L) 03/06/2019 1026   ALBUMIN 3.9 03/06/2019 1026   AST 12 (L) 03/06/2019 1026   ALT 16 03/06/2019 1026   ALKPHOS 45 03/06/2019 1026   BILITOT 0.3 03/06/2019 1026   GFRNONAA 57 (L) 03/06/2019 1026   GFRAA >60 03/06/2019 1026   Lab Results  Component Value Date   WBC 5.7 03/06/2019   NEUTROABS 3.9 03/06/2019   HGB 11.9 (L) 03/06/2019   HCT 34.3 (L) 03/06/2019   MCV 99.4 03/06/2019   PLT 190 03/06/2019    Imaging:  [facility] Clinician Interpretation: I have personally reviewed the CNS images as listed.  My interpretation, in the context of the patient's clinical presentation, is stable disease  MR BRAIN W WO CONTRAST  Result Date: 11/06/2019 CLINICAL DATA:  Follow-up examination for glioblastoma, history of prior resection and radiation. Evaluate treatment response. EXAM: MRI HEAD WITHOUT AND WITH CONTRAST TECHNIQUE: Multiplanar, multiecho pulse sequences of the brain and surrounding structures were obtained without and with intravenous contrast. CONTRAST:  65m MULTIHANCE GADOBENATE DIMEGLUMINE 529 MG/ML IV SOLN COMPARISON:  Comparison made with most recent MRI from 08/16/2019. FINDINGS: Brain: Postoperative changes related to prior glioblastoma resection again seen at the right cerebral hemisphere. Resection cavity involving the right frontal/insular region is relatively stable in appearance. Chronic blood products again noted within the resection cavity. Small extra-axial collection measuring up to 4 mm in thickness seen subjacent to the craniotomy bone flap, stable. No significant mass effect. Surrounding T2/FLAIR signal abnormality throughout the right cerebral hemisphere, with extension into the right cerebral peduncle and midbrain is not significantly changed from previous.  Irregular predominantly serpiginous enhancement about the resection cavity is overall little interval changed from previous, with no new nodularity or other concerning features. No satellite or distant sites of enhancement. No evidence for acute or subacute infarct. No other acute intracranial hemorrhage. No other mass lesion or midline shift. Ex vacuo dilatation of the right lateral ventricle related to the postoperative changes within the right cerebral hemisphere noted, stable. No hydrocephalus. No other extra-axial fluid collection. Vascular: Major intracranial  vascular flow voids are maintained. Skull and upper cervical spine: Craniocervical junction within normal limits. Bone marrow signal intensity normal. Post craniotomy changes noted at the right calvarium. Sinuses/Orbits: Globes and orbital soft tissues within normal limits. Chronic left maxillary sinusitis again noted. Mastoid air cells are otherwise largely clear. No mastoid effusion. Inner ear structures grossly normal. Other: None. IMPRESSION: No significant interval change since 08/16/2019. Stable postoperative changes with enhancement about the resection cavity at the right frontal/insular region. Surrounding T2/FLAIR signal abnormality is not significantly changed. Electronically Signed   By: Jeannine Boga M.D.   On: 11/06/2019 00:51    Assessment/Plan 1.  Glioblastoma, IDH-wildtype [facility])   Bryan [name] is clinically and radiographically stable today.  We are encouraged by his stable functional status today.  We ask that Bryan [name] return to clinic in 3 months following next brain MRI, or sooner as needed.  Decadron may continue at 42m daily.  He should con't Keppra 10042mBID and con't Zonisamide 10045mID for seizure prevention.  We appreciate the opportunity to participate in the care of [facility] will follow up with him soon after MRI is done.  I have spent a total of 30 minutes of face-to-face and  non-face-to-face time, excluding clinical staff time, preparing to see patient, ordering tests and/or medications, counseling the patient, and independently interpreting results and communicating results to the patient/family/caregiver    ZacVentura SellersD Medical Director of Neuro-Oncology [facility] WesPojoaque/16/21 12:59 PM

## 2019-11-07 ENCOUNTER — Telehealth: Payer: Self-pay | Admitting: Internal Medicine

## 2019-11-07 NOTE — Telephone Encounter (Signed)
Scheduled per 8/16 los. Unable to reach pt. Left voicemail with appt time and date.

## 2019-11-08 DIAGNOSIS — C711 Malignant neoplasm of frontal lobe: Secondary | ICD-10-CM | POA: Diagnosis not present

## 2019-11-08 DIAGNOSIS — R296 Repeated falls: Secondary | ICD-10-CM | POA: Diagnosis not present

## 2019-11-08 DIAGNOSIS — G8194 Hemiplegia, unspecified affecting left nondominant side: Secondary | ICD-10-CM | POA: Diagnosis not present

## 2019-11-08 DIAGNOSIS — M1990 Unspecified osteoarthritis, unspecified site: Secondary | ICD-10-CM | POA: Diagnosis not present

## 2019-11-08 DIAGNOSIS — E119 Type 2 diabetes mellitus without complications: Secondary | ICD-10-CM | POA: Diagnosis not present

## 2019-11-08 DIAGNOSIS — R471 Dysarthria and anarthria: Secondary | ICD-10-CM | POA: Diagnosis not present

## 2019-11-09 DIAGNOSIS — G8194 Hemiplegia, unspecified affecting left nondominant side: Secondary | ICD-10-CM | POA: Diagnosis not present

## 2019-11-09 DIAGNOSIS — R471 Dysarthria and anarthria: Secondary | ICD-10-CM | POA: Diagnosis not present

## 2019-11-09 DIAGNOSIS — E119 Type 2 diabetes mellitus without complications: Secondary | ICD-10-CM | POA: Diagnosis not present

## 2019-11-09 DIAGNOSIS — M1990 Unspecified osteoarthritis, unspecified site: Secondary | ICD-10-CM | POA: Diagnosis not present

## 2019-11-09 DIAGNOSIS — C711 Malignant neoplasm of frontal lobe: Secondary | ICD-10-CM | POA: Diagnosis not present

## 2019-11-09 DIAGNOSIS — R296 Repeated falls: Secondary | ICD-10-CM | POA: Diagnosis not present

## 2019-11-15 DIAGNOSIS — M1990 Unspecified osteoarthritis, unspecified site: Secondary | ICD-10-CM | POA: Diagnosis not present

## 2019-11-15 DIAGNOSIS — R471 Dysarthria and anarthria: Secondary | ICD-10-CM | POA: Diagnosis not present

## 2019-11-15 DIAGNOSIS — R296 Repeated falls: Secondary | ICD-10-CM | POA: Diagnosis not present

## 2019-11-15 DIAGNOSIS — E119 Type 2 diabetes mellitus without complications: Secondary | ICD-10-CM | POA: Diagnosis not present

## 2019-11-15 DIAGNOSIS — G8194 Hemiplegia, unspecified affecting left nondominant side: Secondary | ICD-10-CM | POA: Diagnosis not present

## 2019-11-15 DIAGNOSIS — C711 Malignant neoplasm of frontal lobe: Secondary | ICD-10-CM | POA: Diagnosis not present

## 2019-11-16 DIAGNOSIS — G8194 Hemiplegia, unspecified affecting left nondominant side: Secondary | ICD-10-CM | POA: Diagnosis not present

## 2019-11-16 DIAGNOSIS — C711 Malignant neoplasm of frontal lobe: Secondary | ICD-10-CM | POA: Diagnosis not present

## 2019-11-16 DIAGNOSIS — M1990 Unspecified osteoarthritis, unspecified site: Secondary | ICD-10-CM | POA: Diagnosis not present

## 2019-11-16 DIAGNOSIS — R296 Repeated falls: Secondary | ICD-10-CM | POA: Diagnosis not present

## 2019-11-16 DIAGNOSIS — E119 Type 2 diabetes mellitus without complications: Secondary | ICD-10-CM | POA: Diagnosis not present

## 2019-11-16 DIAGNOSIS — R471 Dysarthria and anarthria: Secondary | ICD-10-CM | POA: Diagnosis not present

## 2019-11-17 DIAGNOSIS — Z9181 History of falling: Secondary | ICD-10-CM | POA: Diagnosis not present

## 2019-11-17 DIAGNOSIS — R471 Dysarthria and anarthria: Secondary | ICD-10-CM | POA: Diagnosis not present

## 2019-11-17 DIAGNOSIS — M1990 Unspecified osteoarthritis, unspecified site: Secondary | ICD-10-CM | POA: Diagnosis not present

## 2019-11-17 DIAGNOSIS — E119 Type 2 diabetes mellitus without complications: Secondary | ICD-10-CM | POA: Diagnosis not present

## 2019-11-17 DIAGNOSIS — E079 Disorder of thyroid, unspecified: Secondary | ICD-10-CM | POA: Diagnosis not present

## 2019-11-17 DIAGNOSIS — Z85828 Personal history of other malignant neoplasm of skin: Secondary | ICD-10-CM | POA: Diagnosis not present

## 2019-11-17 DIAGNOSIS — Z79899 Other long term (current) drug therapy: Secondary | ICD-10-CM | POA: Diagnosis not present

## 2019-11-17 DIAGNOSIS — Z7984 Long term (current) use of oral hypoglycemic drugs: Secondary | ICD-10-CM | POA: Diagnosis not present

## 2019-11-17 DIAGNOSIS — Z87891 Personal history of nicotine dependence: Secondary | ICD-10-CM | POA: Diagnosis not present

## 2019-11-17 DIAGNOSIS — C711 Malignant neoplasm of frontal lobe: Secondary | ICD-10-CM | POA: Diagnosis not present

## 2019-11-17 DIAGNOSIS — K219 Gastro-esophageal reflux disease without esophagitis: Secondary | ICD-10-CM | POA: Diagnosis not present

## 2019-11-17 DIAGNOSIS — G8194 Hemiplegia, unspecified affecting left nondominant side: Secondary | ICD-10-CM | POA: Diagnosis not present

## 2019-11-17 DIAGNOSIS — Z993 Dependence on wheelchair: Secondary | ICD-10-CM | POA: Diagnosis not present

## 2019-11-17 DIAGNOSIS — I10 Essential (primary) hypertension: Secondary | ICD-10-CM | POA: Diagnosis not present

## 2019-11-17 DIAGNOSIS — E7800 Pure hypercholesterolemia, unspecified: Secondary | ICD-10-CM | POA: Diagnosis not present

## 2019-11-17 DIAGNOSIS — R296 Repeated falls: Secondary | ICD-10-CM | POA: Diagnosis not present

## 2019-11-22 DIAGNOSIS — I10 Essential (primary) hypertension: Secondary | ICD-10-CM | POA: Diagnosis not present

## 2019-11-22 DIAGNOSIS — R296 Repeated falls: Secondary | ICD-10-CM | POA: Diagnosis not present

## 2019-11-22 DIAGNOSIS — E119 Type 2 diabetes mellitus without complications: Secondary | ICD-10-CM | POA: Diagnosis not present

## 2019-11-22 DIAGNOSIS — C711 Malignant neoplasm of frontal lobe: Secondary | ICD-10-CM | POA: Diagnosis not present

## 2019-11-22 DIAGNOSIS — G8194 Hemiplegia, unspecified affecting left nondominant side: Secondary | ICD-10-CM | POA: Diagnosis not present

## 2019-11-22 DIAGNOSIS — R471 Dysarthria and anarthria: Secondary | ICD-10-CM | POA: Diagnosis not present

## 2019-11-24 DIAGNOSIS — R471 Dysarthria and anarthria: Secondary | ICD-10-CM | POA: Diagnosis not present

## 2019-11-24 DIAGNOSIS — E119 Type 2 diabetes mellitus without complications: Secondary | ICD-10-CM | POA: Diagnosis not present

## 2019-11-24 DIAGNOSIS — G8194 Hemiplegia, unspecified affecting left nondominant side: Secondary | ICD-10-CM | POA: Diagnosis not present

## 2019-11-24 DIAGNOSIS — C711 Malignant neoplasm of frontal lobe: Secondary | ICD-10-CM | POA: Diagnosis not present

## 2019-11-24 DIAGNOSIS — R296 Repeated falls: Secondary | ICD-10-CM | POA: Diagnosis not present

## 2019-11-24 DIAGNOSIS — I10 Essential (primary) hypertension: Secondary | ICD-10-CM | POA: Diagnosis not present

## 2019-11-29 DIAGNOSIS — R296 Repeated falls: Secondary | ICD-10-CM | POA: Diagnosis not present

## 2019-11-29 DIAGNOSIS — G8194 Hemiplegia, unspecified affecting left nondominant side: Secondary | ICD-10-CM | POA: Diagnosis not present

## 2019-11-29 DIAGNOSIS — E119 Type 2 diabetes mellitus without complications: Secondary | ICD-10-CM | POA: Diagnosis not present

## 2019-11-29 DIAGNOSIS — I10 Essential (primary) hypertension: Secondary | ICD-10-CM | POA: Diagnosis not present

## 2019-11-29 DIAGNOSIS — R471 Dysarthria and anarthria: Secondary | ICD-10-CM | POA: Diagnosis not present

## 2019-11-29 DIAGNOSIS — C711 Malignant neoplasm of frontal lobe: Secondary | ICD-10-CM | POA: Diagnosis not present

## 2019-12-01 DIAGNOSIS — R296 Repeated falls: Secondary | ICD-10-CM | POA: Diagnosis not present

## 2019-12-01 DIAGNOSIS — R471 Dysarthria and anarthria: Secondary | ICD-10-CM | POA: Diagnosis not present

## 2019-12-01 DIAGNOSIS — E119 Type 2 diabetes mellitus without complications: Secondary | ICD-10-CM | POA: Diagnosis not present

## 2019-12-01 DIAGNOSIS — G8194 Hemiplegia, unspecified affecting left nondominant side: Secondary | ICD-10-CM | POA: Diagnosis not present

## 2019-12-01 DIAGNOSIS — C711 Malignant neoplasm of frontal lobe: Secondary | ICD-10-CM | POA: Diagnosis not present

## 2019-12-01 DIAGNOSIS — I10 Essential (primary) hypertension: Secondary | ICD-10-CM | POA: Diagnosis not present

## 2019-12-04 DIAGNOSIS — I10 Essential (primary) hypertension: Secondary | ICD-10-CM | POA: Diagnosis not present

## 2019-12-04 DIAGNOSIS — G8194 Hemiplegia, unspecified affecting left nondominant side: Secondary | ICD-10-CM | POA: Diagnosis not present

## 2019-12-04 DIAGNOSIS — C711 Malignant neoplasm of frontal lobe: Secondary | ICD-10-CM | POA: Diagnosis not present

## 2019-12-04 DIAGNOSIS — E119 Type 2 diabetes mellitus without complications: Secondary | ICD-10-CM | POA: Diagnosis not present

## 2019-12-04 DIAGNOSIS — R471 Dysarthria and anarthria: Secondary | ICD-10-CM | POA: Diagnosis not present

## 2019-12-04 DIAGNOSIS — R296 Repeated falls: Secondary | ICD-10-CM | POA: Diagnosis not present

## 2019-12-06 DIAGNOSIS — R296 Repeated falls: Secondary | ICD-10-CM | POA: Diagnosis not present

## 2019-12-06 DIAGNOSIS — I10 Essential (primary) hypertension: Secondary | ICD-10-CM | POA: Diagnosis not present

## 2019-12-06 DIAGNOSIS — G8194 Hemiplegia, unspecified affecting left nondominant side: Secondary | ICD-10-CM | POA: Diagnosis not present

## 2019-12-06 DIAGNOSIS — E119 Type 2 diabetes mellitus without complications: Secondary | ICD-10-CM | POA: Diagnosis not present

## 2019-12-06 DIAGNOSIS — R471 Dysarthria and anarthria: Secondary | ICD-10-CM | POA: Diagnosis not present

## 2019-12-06 DIAGNOSIS — C711 Malignant neoplasm of frontal lobe: Secondary | ICD-10-CM | POA: Diagnosis not present

## 2019-12-08 DIAGNOSIS — R296 Repeated falls: Secondary | ICD-10-CM | POA: Diagnosis not present

## 2019-12-08 DIAGNOSIS — R471 Dysarthria and anarthria: Secondary | ICD-10-CM | POA: Diagnosis not present

## 2019-12-08 DIAGNOSIS — C711 Malignant neoplasm of frontal lobe: Secondary | ICD-10-CM | POA: Diagnosis not present

## 2019-12-08 DIAGNOSIS — E119 Type 2 diabetes mellitus without complications: Secondary | ICD-10-CM | POA: Diagnosis not present

## 2019-12-08 DIAGNOSIS — I10 Essential (primary) hypertension: Secondary | ICD-10-CM | POA: Diagnosis not present

## 2019-12-08 DIAGNOSIS — G8194 Hemiplegia, unspecified affecting left nondominant side: Secondary | ICD-10-CM | POA: Diagnosis not present

## 2019-12-11 DIAGNOSIS — R296 Repeated falls: Secondary | ICD-10-CM | POA: Diagnosis not present

## 2019-12-11 DIAGNOSIS — C711 Malignant neoplasm of frontal lobe: Secondary | ICD-10-CM | POA: Diagnosis not present

## 2019-12-11 DIAGNOSIS — R471 Dysarthria and anarthria: Secondary | ICD-10-CM | POA: Diagnosis not present

## 2019-12-11 DIAGNOSIS — E119 Type 2 diabetes mellitus without complications: Secondary | ICD-10-CM | POA: Diagnosis not present

## 2019-12-11 DIAGNOSIS — I10 Essential (primary) hypertension: Secondary | ICD-10-CM | POA: Diagnosis not present

## 2019-12-11 DIAGNOSIS — G8194 Hemiplegia, unspecified affecting left nondominant side: Secondary | ICD-10-CM | POA: Diagnosis not present

## 2019-12-12 DIAGNOSIS — I10 Essential (primary) hypertension: Secondary | ICD-10-CM | POA: Diagnosis not present

## 2019-12-12 DIAGNOSIS — G8194 Hemiplegia, unspecified affecting left nondominant side: Secondary | ICD-10-CM | POA: Diagnosis not present

## 2019-12-12 DIAGNOSIS — R296 Repeated falls: Secondary | ICD-10-CM | POA: Diagnosis not present

## 2019-12-12 DIAGNOSIS — E119 Type 2 diabetes mellitus without complications: Secondary | ICD-10-CM | POA: Diagnosis not present

## 2019-12-12 DIAGNOSIS — C711 Malignant neoplasm of frontal lobe: Secondary | ICD-10-CM | POA: Diagnosis not present

## 2019-12-12 DIAGNOSIS — R471 Dysarthria and anarthria: Secondary | ICD-10-CM | POA: Diagnosis not present

## 2019-12-13 ENCOUNTER — Other Ambulatory Visit: Payer: Self-pay | Admitting: Internal Medicine

## 2019-12-13 DIAGNOSIS — I10 Essential (primary) hypertension: Secondary | ICD-10-CM | POA: Diagnosis not present

## 2019-12-13 DIAGNOSIS — E119 Type 2 diabetes mellitus without complications: Secondary | ICD-10-CM | POA: Diagnosis not present

## 2019-12-13 DIAGNOSIS — R471 Dysarthria and anarthria: Secondary | ICD-10-CM | POA: Diagnosis not present

## 2019-12-13 DIAGNOSIS — R296 Repeated falls: Secondary | ICD-10-CM | POA: Diagnosis not present

## 2019-12-13 DIAGNOSIS — C711 Malignant neoplasm of frontal lobe: Secondary | ICD-10-CM | POA: Diagnosis not present

## 2019-12-13 DIAGNOSIS — G8194 Hemiplegia, unspecified affecting left nondominant side: Secondary | ICD-10-CM | POA: Diagnosis not present

## 2019-12-13 NOTE — Telephone Encounter (Signed)
Refill request

## 2019-12-17 DIAGNOSIS — C711 Malignant neoplasm of frontal lobe: Secondary | ICD-10-CM | POA: Diagnosis not present

## 2019-12-17 DIAGNOSIS — K219 Gastro-esophageal reflux disease without esophagitis: Secondary | ICD-10-CM | POA: Diagnosis not present

## 2019-12-17 DIAGNOSIS — G8194 Hemiplegia, unspecified affecting left nondominant side: Secondary | ICD-10-CM | POA: Diagnosis not present

## 2019-12-17 DIAGNOSIS — R471 Dysarthria and anarthria: Secondary | ICD-10-CM | POA: Diagnosis not present

## 2019-12-17 DIAGNOSIS — I10 Essential (primary) hypertension: Secondary | ICD-10-CM | POA: Diagnosis not present

## 2019-12-17 DIAGNOSIS — R296 Repeated falls: Secondary | ICD-10-CM | POA: Diagnosis not present

## 2019-12-17 DIAGNOSIS — Z85828 Personal history of other malignant neoplasm of skin: Secondary | ICD-10-CM | POA: Diagnosis not present

## 2019-12-17 DIAGNOSIS — E7800 Pure hypercholesterolemia, unspecified: Secondary | ICD-10-CM | POA: Diagnosis not present

## 2019-12-17 DIAGNOSIS — Z79899 Other long term (current) drug therapy: Secondary | ICD-10-CM | POA: Diagnosis not present

## 2019-12-17 DIAGNOSIS — Z7984 Long term (current) use of oral hypoglycemic drugs: Secondary | ICD-10-CM | POA: Diagnosis not present

## 2019-12-17 DIAGNOSIS — E119 Type 2 diabetes mellitus without complications: Secondary | ICD-10-CM | POA: Diagnosis not present

## 2019-12-17 DIAGNOSIS — Z9181 History of falling: Secondary | ICD-10-CM | POA: Diagnosis not present

## 2019-12-17 DIAGNOSIS — Z87891 Personal history of nicotine dependence: Secondary | ICD-10-CM | POA: Diagnosis not present

## 2019-12-17 DIAGNOSIS — M1990 Unspecified osteoarthritis, unspecified site: Secondary | ICD-10-CM | POA: Diagnosis not present

## 2019-12-17 DIAGNOSIS — Z993 Dependence on wheelchair: Secondary | ICD-10-CM | POA: Diagnosis not present

## 2019-12-17 DIAGNOSIS — E079 Disorder of thyroid, unspecified: Secondary | ICD-10-CM | POA: Diagnosis not present

## 2019-12-18 DIAGNOSIS — E119 Type 2 diabetes mellitus without complications: Secondary | ICD-10-CM | POA: Diagnosis not present

## 2019-12-18 DIAGNOSIS — G8194 Hemiplegia, unspecified affecting left nondominant side: Secondary | ICD-10-CM | POA: Diagnosis not present

## 2019-12-18 DIAGNOSIS — R471 Dysarthria and anarthria: Secondary | ICD-10-CM | POA: Diagnosis not present

## 2019-12-18 DIAGNOSIS — I10 Essential (primary) hypertension: Secondary | ICD-10-CM | POA: Diagnosis not present

## 2019-12-18 DIAGNOSIS — C711 Malignant neoplasm of frontal lobe: Secondary | ICD-10-CM | POA: Diagnosis not present

## 2019-12-18 DIAGNOSIS — R296 Repeated falls: Secondary | ICD-10-CM | POA: Diagnosis not present

## 2019-12-20 DIAGNOSIS — R296 Repeated falls: Secondary | ICD-10-CM | POA: Diagnosis not present

## 2019-12-20 DIAGNOSIS — R471 Dysarthria and anarthria: Secondary | ICD-10-CM | POA: Diagnosis not present

## 2019-12-20 DIAGNOSIS — E119 Type 2 diabetes mellitus without complications: Secondary | ICD-10-CM | POA: Diagnosis not present

## 2019-12-20 DIAGNOSIS — I10 Essential (primary) hypertension: Secondary | ICD-10-CM | POA: Diagnosis not present

## 2019-12-20 DIAGNOSIS — G8194 Hemiplegia, unspecified affecting left nondominant side: Secondary | ICD-10-CM | POA: Diagnosis not present

## 2019-12-20 DIAGNOSIS — C711 Malignant neoplasm of frontal lobe: Secondary | ICD-10-CM | POA: Diagnosis not present

## 2019-12-22 DIAGNOSIS — R471 Dysarthria and anarthria: Secondary | ICD-10-CM | POA: Diagnosis not present

## 2019-12-22 DIAGNOSIS — E119 Type 2 diabetes mellitus without complications: Secondary | ICD-10-CM | POA: Diagnosis not present

## 2019-12-22 DIAGNOSIS — G8194 Hemiplegia, unspecified affecting left nondominant side: Secondary | ICD-10-CM | POA: Diagnosis not present

## 2019-12-22 DIAGNOSIS — R296 Repeated falls: Secondary | ICD-10-CM | POA: Diagnosis not present

## 2019-12-22 DIAGNOSIS — C711 Malignant neoplasm of frontal lobe: Secondary | ICD-10-CM | POA: Diagnosis not present

## 2019-12-22 DIAGNOSIS — I10 Essential (primary) hypertension: Secondary | ICD-10-CM | POA: Diagnosis not present

## 2019-12-25 DIAGNOSIS — R471 Dysarthria and anarthria: Secondary | ICD-10-CM | POA: Diagnosis not present

## 2019-12-25 DIAGNOSIS — R296 Repeated falls: Secondary | ICD-10-CM | POA: Diagnosis not present

## 2019-12-25 DIAGNOSIS — E119 Type 2 diabetes mellitus without complications: Secondary | ICD-10-CM | POA: Diagnosis not present

## 2019-12-25 DIAGNOSIS — I10 Essential (primary) hypertension: Secondary | ICD-10-CM | POA: Diagnosis not present

## 2019-12-25 DIAGNOSIS — G8194 Hemiplegia, unspecified affecting left nondominant side: Secondary | ICD-10-CM | POA: Diagnosis not present

## 2019-12-25 DIAGNOSIS — C711 Malignant neoplasm of frontal lobe: Secondary | ICD-10-CM | POA: Diagnosis not present

## 2019-12-27 DIAGNOSIS — G8194 Hemiplegia, unspecified affecting left nondominant side: Secondary | ICD-10-CM | POA: Diagnosis not present

## 2019-12-27 DIAGNOSIS — R296 Repeated falls: Secondary | ICD-10-CM | POA: Diagnosis not present

## 2019-12-27 DIAGNOSIS — C711 Malignant neoplasm of frontal lobe: Secondary | ICD-10-CM | POA: Diagnosis not present

## 2019-12-27 DIAGNOSIS — E119 Type 2 diabetes mellitus without complications: Secondary | ICD-10-CM | POA: Diagnosis not present

## 2019-12-27 DIAGNOSIS — I10 Essential (primary) hypertension: Secondary | ICD-10-CM | POA: Diagnosis not present

## 2019-12-27 DIAGNOSIS — R471 Dysarthria and anarthria: Secondary | ICD-10-CM | POA: Diagnosis not present

## 2020-01-01 DIAGNOSIS — G8194 Hemiplegia, unspecified affecting left nondominant side: Secondary | ICD-10-CM | POA: Diagnosis not present

## 2020-01-01 DIAGNOSIS — R471 Dysarthria and anarthria: Secondary | ICD-10-CM | POA: Diagnosis not present

## 2020-01-01 DIAGNOSIS — C711 Malignant neoplasm of frontal lobe: Secondary | ICD-10-CM | POA: Diagnosis not present

## 2020-01-01 DIAGNOSIS — I10 Essential (primary) hypertension: Secondary | ICD-10-CM | POA: Diagnosis not present

## 2020-01-01 DIAGNOSIS — E119 Type 2 diabetes mellitus without complications: Secondary | ICD-10-CM | POA: Diagnosis not present

## 2020-01-01 DIAGNOSIS — R296 Repeated falls: Secondary | ICD-10-CM | POA: Diagnosis not present

## 2020-01-03 DIAGNOSIS — R296 Repeated falls: Secondary | ICD-10-CM | POA: Diagnosis not present

## 2020-01-03 DIAGNOSIS — I10 Essential (primary) hypertension: Secondary | ICD-10-CM | POA: Diagnosis not present

## 2020-01-03 DIAGNOSIS — E119 Type 2 diabetes mellitus without complications: Secondary | ICD-10-CM | POA: Diagnosis not present

## 2020-01-03 DIAGNOSIS — R471 Dysarthria and anarthria: Secondary | ICD-10-CM | POA: Diagnosis not present

## 2020-01-03 DIAGNOSIS — C711 Malignant neoplasm of frontal lobe: Secondary | ICD-10-CM | POA: Diagnosis not present

## 2020-01-03 DIAGNOSIS — G8194 Hemiplegia, unspecified affecting left nondominant side: Secondary | ICD-10-CM | POA: Diagnosis not present

## 2020-01-05 DIAGNOSIS — R296 Repeated falls: Secondary | ICD-10-CM | POA: Diagnosis not present

## 2020-01-05 DIAGNOSIS — G8194 Hemiplegia, unspecified affecting left nondominant side: Secondary | ICD-10-CM | POA: Diagnosis not present

## 2020-01-05 DIAGNOSIS — E119 Type 2 diabetes mellitus without complications: Secondary | ICD-10-CM | POA: Diagnosis not present

## 2020-01-05 DIAGNOSIS — C711 Malignant neoplasm of frontal lobe: Secondary | ICD-10-CM | POA: Diagnosis not present

## 2020-01-05 DIAGNOSIS — R471 Dysarthria and anarthria: Secondary | ICD-10-CM | POA: Diagnosis not present

## 2020-01-05 DIAGNOSIS — I10 Essential (primary) hypertension: Secondary | ICD-10-CM | POA: Diagnosis not present

## 2020-01-06 ENCOUNTER — Other Ambulatory Visit: Payer: Self-pay | Admitting: Internal Medicine

## 2020-01-09 DIAGNOSIS — R471 Dysarthria and anarthria: Secondary | ICD-10-CM | POA: Diagnosis not present

## 2020-01-09 DIAGNOSIS — I10 Essential (primary) hypertension: Secondary | ICD-10-CM | POA: Diagnosis not present

## 2020-01-09 DIAGNOSIS — G8194 Hemiplegia, unspecified affecting left nondominant side: Secondary | ICD-10-CM | POA: Diagnosis not present

## 2020-01-09 DIAGNOSIS — C711 Malignant neoplasm of frontal lobe: Secondary | ICD-10-CM | POA: Diagnosis not present

## 2020-01-09 DIAGNOSIS — E119 Type 2 diabetes mellitus without complications: Secondary | ICD-10-CM | POA: Diagnosis not present

## 2020-01-09 DIAGNOSIS — R296 Repeated falls: Secondary | ICD-10-CM | POA: Diagnosis not present

## 2020-01-10 DIAGNOSIS — R296 Repeated falls: Secondary | ICD-10-CM | POA: Diagnosis not present

## 2020-01-10 DIAGNOSIS — R471 Dysarthria and anarthria: Secondary | ICD-10-CM | POA: Diagnosis not present

## 2020-01-10 DIAGNOSIS — C711 Malignant neoplasm of frontal lobe: Secondary | ICD-10-CM | POA: Diagnosis not present

## 2020-01-10 DIAGNOSIS — G8194 Hemiplegia, unspecified affecting left nondominant side: Secondary | ICD-10-CM | POA: Diagnosis not present

## 2020-01-10 DIAGNOSIS — E119 Type 2 diabetes mellitus without complications: Secondary | ICD-10-CM | POA: Diagnosis not present

## 2020-01-10 DIAGNOSIS — I10 Essential (primary) hypertension: Secondary | ICD-10-CM | POA: Diagnosis not present

## 2020-01-15 ENCOUNTER — Other Ambulatory Visit: Payer: Self-pay | Admitting: *Deleted

## 2020-01-15 DIAGNOSIS — C711 Malignant neoplasm of frontal lobe: Secondary | ICD-10-CM

## 2020-01-16 ENCOUNTER — Other Ambulatory Visit: Payer: Self-pay | Admitting: Radiation Therapy

## 2020-02-02 ENCOUNTER — Ambulatory Visit
Admission: RE | Admit: 2020-02-02 | Discharge: 2020-02-02 | Disposition: A | Discharge status: Home / Self Care | Payer: Medicare Other | Source: Ambulatory Visit | Attending: Internal Medicine | Admitting: Internal Medicine

## 2020-02-02 ENCOUNTER — Other Ambulatory Visit: Payer: Self-pay

## 2020-02-02 DIAGNOSIS — C711 Malignant neoplasm of frontal lobe: Secondary | ICD-10-CM

## 2020-02-02 DIAGNOSIS — G9389 Other specified disorders of brain: Secondary | ICD-10-CM | POA: Diagnosis not present

## 2020-02-02 DIAGNOSIS — D6481 Anemia due to antineoplastic chemotherapy: Secondary | ICD-10-CM | POA: Diagnosis not present

## 2020-02-02 DIAGNOSIS — E119 Type 2 diabetes mellitus without complications: Secondary | ICD-10-CM | POA: Diagnosis not present

## 2020-02-02 DIAGNOSIS — I700 Atherosclerosis of aorta: Secondary | ICD-10-CM | POA: Diagnosis not present

## 2020-02-02 DIAGNOSIS — G8190 Hemiplegia, unspecified affecting unspecified side: Secondary | ICD-10-CM | POA: Diagnosis not present

## 2020-02-02 DIAGNOSIS — E538 Deficiency of other specified B group vitamins: Secondary | ICD-10-CM | POA: Diagnosis not present

## 2020-02-02 DIAGNOSIS — C719 Malignant neoplasm of brain, unspecified: Secondary | ICD-10-CM | POA: Diagnosis not present

## 2020-02-02 DIAGNOSIS — N1831 Chronic kidney disease, stage 3a: Secondary | ICD-10-CM | POA: Diagnosis not present

## 2020-02-02 DIAGNOSIS — G319 Degenerative disease of nervous system, unspecified: Secondary | ICD-10-CM | POA: Diagnosis not present

## 2020-02-02 DIAGNOSIS — I10 Essential (primary) hypertension: Secondary | ICD-10-CM | POA: Diagnosis not present

## 2020-02-02 DIAGNOSIS — I2510 Atherosclerotic heart disease of native coronary artery without angina pectoris: Secondary | ICD-10-CM | POA: Diagnosis not present

## 2020-02-02 DIAGNOSIS — D496 Neoplasm of unspecified behavior of brain: Secondary | ICD-10-CM | POA: Diagnosis not present

## 2020-02-02 DIAGNOSIS — E039 Hypothyroidism, unspecified: Secondary | ICD-10-CM | POA: Diagnosis not present

## 2020-02-02 DIAGNOSIS — E559 Vitamin D deficiency, unspecified: Secondary | ICD-10-CM | POA: Diagnosis not present

## 2020-02-02 DIAGNOSIS — Z23 Encounter for immunization: Secondary | ICD-10-CM | POA: Diagnosis not present

## 2020-02-02 MED ORDER — GADOBENATE DIMEGLUMINE 529 MG/ML IV SOLN
13.0000 mL | Freq: Once | INTRAVENOUS | Status: AC | PRN
  Administered 2020-02-02: 13 mL via INTRAVENOUS

## 2020-02-05 ENCOUNTER — Inpatient Hospital Stay: Payer: Medicare Other | Attending: Internal Medicine | Admitting: Internal Medicine

## 2020-02-05 ENCOUNTER — Other Ambulatory Visit: Payer: Self-pay

## 2020-02-05 ENCOUNTER — Inpatient Hospital Stay: Payer: Medicare Other

## 2020-02-05 VITALS — BP 137/60 | HR 51 | Temp 97.0°F | Resp 18 | Ht 64.0 in | Wt 134.0 lb

## 2020-02-05 DIAGNOSIS — Z7952 Long term (current) use of systemic steroids: Secondary | ICD-10-CM | POA: Insufficient documentation

## 2020-02-05 DIAGNOSIS — R531 Weakness: Secondary | ICD-10-CM | POA: Insufficient documentation

## 2020-02-05 DIAGNOSIS — Z85828 Personal history of other malignant neoplasm of skin: Secondary | ICD-10-CM | POA: Insufficient documentation

## 2020-02-05 DIAGNOSIS — Z79899 Other long term (current) drug therapy: Secondary | ICD-10-CM | POA: Insufficient documentation

## 2020-02-05 DIAGNOSIS — Z87891 Personal history of nicotine dependence: Secondary | ICD-10-CM | POA: Insufficient documentation

## 2020-02-05 DIAGNOSIS — R4189 Other symptoms and signs involving cognitive functions and awareness: Secondary | ICD-10-CM | POA: Diagnosis not present

## 2020-02-05 DIAGNOSIS — C711 Malignant neoplasm of frontal lobe: Secondary | ICD-10-CM | POA: Diagnosis not present

## 2020-02-05 DIAGNOSIS — Z9221 Personal history of antineoplastic chemotherapy: Secondary | ICD-10-CM | POA: Insufficient documentation

## 2020-02-05 DIAGNOSIS — Z923 Personal history of irradiation: Secondary | ICD-10-CM | POA: Diagnosis not present

## 2020-02-05 NOTE — Progress Notes (Signed)
[facility] [place], [place] [address] [phone]   Interval Evaluation  Date of Service: 02/05/20 Patient Name: Bryan [name] Patient MRN: [id number] Patient DOB: 1941-03-18 Provider: Ventura Sellers, MD  Identifying Statement:  [name] is a 79 y.o. male with right frontal glioblastoma   Oncologic History: Oncology History   Glioblastoma, IDH-wildtype ([facility])   03/07/2018 Surgery   Craniotomy, resection by Dr. Tommi Rumps ([facility]).  Path is Glioblastoma   03/29/2018 - 03/29/2018 Chemotherapy   The patient had temozolomide (TEMODAR) 140 MG capsule, 140 mg (100 % of original dose 140 mg), Oral, Daily, 1 of 1 cycle, Start date: 03/29/2018, End date: 05/10/2018 Dose modification: 140 mg (original dose 140 mg, Cycle 1)  for chemotherapy treatment.    04/04/2018 - 05/13/2018 Radiation Therapy   IMRT and concurrent Temozolomide with Dr. Tammi Klippel   06/09/2018 - 01/02/2019 Chemotherapy   The patient completed 6 cycles of adjuvant 5-day Temozolomide       Biomarkers:  MGMT Unknown.  IDH 1/2 Wild type.  EGFR Expressed  TERT Unknown   Interval History: Bryan [name] presents today for follow up after MRI brain.  No changes in left sided weakness or balance.  No further falls, continues to use the walker at home.  Speaking/fluency is otherwise stable from prior.  History is overall still limited by language impairments, wife assisted again with history.    H+P (02/16/18) Bryan [name] 79 y.o. male presented after family noted drooping of the left side of his face yesterday.  In addition, he/they describe some cognitive changes, such as difficulty "finding words" in certain situations.  The patient also feels like his left leg is "lagging" a little behind his right lately.  He is walking on his own, however, and with no falls.  Denies headaches, seizures.   Medications: Current Outpatient Medications on File Prior to Visit   Medication Sig Dispense Refill  . acetaminophen (TYLENOL) 325 MG tablet Take 2 tablets (650 mg total) by mouth every 6 (six) hours as needed for mild pain.    . cetirizine-pseudoephedrine (ZYRTEC-D ALLERGY & CONGESTION) 5-120 MG tablet Take 1 tablet by mouth 2 (two) times daily.    . Cholecalciferol (D-3-5) 125 MCG (5000 UT) capsule Take 1 capsule by mouth daily.    . Cyanocobalamin (B-12) 1000 MCG LOZG Take 2 lozenges by mouth daily.    [name] dexamethasone (DECADRON) 1 MG tablet TAKE 1 TABLET EVERY DAY 90 tablet 1  . fluticasone (FLONASE) 50 MCG/ACT nasal spray Place 1 spray into both nostrils 2 (two) times daily.    [name] levETIRAcetam (KEPPRA) 100 MG/ML solution Take 10 mLs (1,000 mg total) by mouth 2 (two) times daily. 473 mL 2  . levothyroxine (SYNTHROID, LEVOTHROID) 75 MCG tablet Take 1 tablet (75 mcg total) by mouth daily before breakfast. 30 tablet 0  . zonisamide (ZONEGRAN) 100 MG capsule TAKE 1 CAPSULE (100 MG TOTAL) BY MOUTH DAILY. 90 capsule 3   No current facility-administered medications on file prior to visit.    Allergies:  Allergies  Allergen Reactions  . Other     Cats, pollen   Past Medical History:  Past Medical History:  Diagnosis Date  . Arthritis   . Brain cancer ([facility])    Glioblastoma  . Diabetes mellitus without complication ([facility])   . GERD (gastroesophageal reflux disease)   . Hypercholesterolemia   . Hypertension   . Thyroid disease    Past Surgical History:  Past  Surgical History:  Procedure Laterality Date  . CRANIOTOMY    . HERNIA REPAIR    . 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 Marital status: Married    Spouse name: Not on file  . Number of children: Not on file  . Years of education: Not on file  . Highest education level: Not on file  Occupational History  . Not on file  Tobacco Use  . Smoking status: Former Smoker    Packs/day: 0.25    Years: 3.00     Pack years: 0.75    Types: Cigarettes    Quit date: 03/23/1961    Years since quitting: 58.9  . Smokeless tobacco: Never Used  Vaping Use  . Vaping Use: Never used  Substance and Sexual Activity  . Alcohol use: Yes    Alcohol/week: 1.0 standard drink    Types: 1 Glasses of wine per week    Comment: weekly  . Drug use: Never  . Sexual activity: Not Currently  Other Topics Concern  . Not on file  Social History Narrative   Married. Resides in [place].   Social Determinants of Health   Financial Resource Strain:   . Difficulty of Paying Living Expenses: Not on file  Food Insecurity:   . Worried About Charity fundraiser in the Last Year: Not on file  . Ran Out of Food in the Last Year: Not on file  Transportation Needs:   . Lack of Transportation (Medical): Not on file  . Lack of Transportation (Non-Medical): Not on file  Physical Activity:   . Days of Exercise per Week: Not on file  . Minutes of Exercise per Session: Not on file  Stress:   . Feeling of Stress : Not on file  Social Connections:   . Frequency of Communication with Friends and Family: Not on file  . Frequency of Social Gatherings with Friends and Family: Not on file  . Attends Religious Services: Not on file  . Active Member of Clubs or Organizations: Not on file  . Attends Archivist Meetings: Not on file  . Marital Status: Not on file  Intimate Partner Violence:   . Fear of Current or Ex-Partner: Not on file  . Emotionally Abused: Not on file  . Physically Abused: Not on file  . Sexually Abused: Not on file   Family History:  Family History  Problem Relation Age of Onset  . Diabetes Mellitus II Mother   . ALS Father     Review of Systems: Constitutional: Denies fevers, chills or abnormal weight loss Eyes: Denies blurriness of vision Ears, nose, mouth, throat, and face: Denies mucositis or sore throat Respiratory: Denies cough, dyspnea or wheezes Cardiovascular: Denies  palpitation, chest discomfort or lower extremity swelling Gastrointestinal:  Denies nausea, constipation, diarrhea GU: Denies dysuria or incontinence Skin: Denies abnormal skin rashes Neurological: Per HPI Musculoskeletal: Denies joint pain, back or neck discomfort. No decrease in ROM Behavioral/Psych: Denies anxiety, disturbance in thought content, and mood instability  Physical Exam: Vitals:   02/05/20 1206  BP: 137/60  Pulse: (!) 51  Resp: 18  Temp: (!) 97 F (36.1 C)  SpO2: 97%   KPS: 70. General: Alert, cooperative, pleasant, in no acute distress Head: Craniotomy scar noted, dry and intact. EENT: No conjunctival injection or scleral icterus. Oral mucosa moist Lungs: Resp effort normal Cardiac: Regular rate and rhythm  Abdomen: Soft, non-distended abdomen Skin: No rashes cyanosis or petechiae. Extremities: no edema  Neurologic Exam: Mental Status: Awake, alert, attentive to examiner. Oriented to self and environment. Language is fairly densely impaired with regards to fluency, comprehension and repetition.   Cranial Nerves: Visual acuity is grossly normal. Visual fields are full. Extra-ocular movements intact. No ptosis. L UMN facial paresis, tongue midline. Motor: Tone and bulk are normal. Power is 3/5 in left arm and 2/5 in leg, with absent fine motor function in left hand.  Left thenar atrophy noted. Reflexes are symmetric, no pathologic reflexes present. Intact finger to nose bilaterally Sensory: Intact to light touch and temperature Gait: Deferred  Labs: I have reviewed the data as listed    Component Value Date/Time   NA 141 03/06/2019 1026   K 4.1 03/06/2019 1026   CL 104 03/06/2019 1026   CO2 26 03/06/2019 1026   GLUCOSE 168 (H) 03/06/2019 1026   BUN 27 (H) 03/06/2019 1026   CREATININE 1.21 03/06/2019 1026   CALCIUM 9.4 03/06/2019 1026   PROT 6.4 (L) 03/06/2019 1026   ALBUMIN 3.9 03/06/2019 1026   AST 12 (L) 03/06/2019 1026   ALT 16 03/06/2019 1026    ALKPHOS 45 03/06/2019 1026   BILITOT 0.3 03/06/2019 1026   GFRNONAA 57 (L) 03/06/2019 1026   GFRAA >60 03/06/2019 1026   Lab Results  Component Value Date   WBC 5.7 03/06/2019   NEUTROABS 3.9 03/06/2019   HGB 11.9 (L) 03/06/2019   HCT 34.3 (L) 03/06/2019   MCV 99.4 03/06/2019   PLT 190 03/06/2019    Imaging:  [facility] Clinician Interpretation: I have personally reviewed the CNS images as listed.  My interpretation, in the context of the patient's clinical presentation, is stable disease  MR Brain W Wo Contrast  Result Date: 02/02/2020 CLINICAL DATA:  Glioblastoma surveillance EXAM: MRI HEAD WITHOUT AND WITH CONTRAST TECHNIQUE: Multiplanar, multiecho pulse sequences of the brain and surrounding structures were obtained without and with intravenous contrast. CONTRAST:  53m MULTIHANCE GADOBENATE DIMEGLUMINE 529 MG/ML IV SOLN COMPARISON:  MRI head 11/05/2019 FINDINGS: Brain: Stable appearance of the tumor bed in the right frontal operculum. There is mild irregular peripheral enhancement around the resection cavity measuring approximately 23 x 16 x 24 mm. Chronic blood products are present at the resection site, stable. Extensive white matter hyperintensity throughout the right cerebral hemisphere is unchanged. There is no new area of enhancement or mass-effect. Generalized atrophy. Asymmetric dilatation of the right lateral ventricle and right temporal horn is unchanged. No acute infarct. Vascular: Normal arterial flow voids. Skull and upper cervical spine: Right frontal craniotomy changes. Sinuses/Orbits: Complete opacification left maxillary sinus unchanged. Remaining sinuses clear. Negative orbit Other: None IMPRESSION: Stable MRI without interval change. Post treatment changes in the right frontal operculum stable. Electronically Signed   By: CFranchot GalloM.D.   On: 02/02/2020 12:42    Assessment/Plan 1.  Glioblastoma, IDH-wildtype ([facility]   Mr. MHyseris clinically and radiographically  stable today.  Despite limitations in activities of daily living, no new or progressive neurologic deficits.  We ask that EShreyan Hinzreturn to clinic in 3 months following next brain MRI, or sooner as needed.  Decadron may continue at 147mdaily.  He should con't Keppra 100042mID and con't Zonisamide 100m60mD for seizure prevention.  We appreciate the opportunity to participate in the care of [facility] will follow up with him soon after MRI is done.  I have spent a total  of 30 minutes of face-to-face and non-face-to-face time, excluding clinical staff time, preparing to see patient, ordering tests and/or medications, counseling the patient, and independently interpreting results and communicating results to the patient/family/caregiver    Ventura Sellers, MD Medical Director of Neuro-Oncology [facility] at [place] 02/05/20 12:35 PM

## 2020-02-06 ENCOUNTER — Telehealth: Payer: Self-pay | Admitting: Internal Medicine

## 2020-02-06 NOTE — Telephone Encounter (Signed)
Scheduled per 11/15 los. Spoke with pt's wife Juliann Pulse and is aware of appt time and date.

## 2020-02-22 ENCOUNTER — Other Ambulatory Visit: Payer: Self-pay | Admitting: *Deleted

## 2020-02-22 MED ORDER — LEVETIRACETAM 100 MG/ML PO SOLN: 500.0000 mg | Freq: Two times a day (BID) | ORAL | 2 refills | Status: DC

## 2020-02-22 MED ORDER — LEVETIRACETAM 100 MG/ML PO SOLN: 500.0000 mg | Freq: Two times a day (BID) | ORAL | 0 refills | Status: DC

## 2020-02-22 NOTE — Progress Notes (Signed)
Patients wife called.  He has ran out of Keppra.  He is taking 5 mls BID.  THey normally use [employer] but since he is out they will need a one time dose to [facility] and then additional refills can go to [facility].  Both processed today.

## 2020-03-08 ENCOUNTER — Other Ambulatory Visit: Payer: Self-pay | Admitting: Internal Medicine

## 2020-04-01 DIAGNOSIS — Z23 Encounter for immunization: Secondary | ICD-10-CM | POA: Diagnosis not present

## 2020-04-19 ENCOUNTER — Other Ambulatory Visit: Payer: Self-pay | Admitting: Internal Medicine

## 2020-04-19 ENCOUNTER — Other Ambulatory Visit: Payer: Self-pay | Admitting: Radiation Therapy

## 2020-04-19 ENCOUNTER — Telehealth: Payer: Self-pay | Admitting: Radiation Therapy

## 2020-04-19 NOTE — Telephone Encounter (Signed)
Spoke with the patient's wife about his upcoming brain MRI and follow-up with Dr. Mickeal Skinner.   Mont Dutton R.T.(R)(T) Radiation Special Procedures Navigator

## 2020-04-29 ENCOUNTER — Other Ambulatory Visit: Payer: Self-pay | Admitting: Internal Medicine

## 2020-04-29 ENCOUNTER — Telehealth: Payer: Self-pay

## 2020-04-29 MED ORDER — LEVETIRACETAM 100 MG/ML PO SOLN: ORAL | 1 refills | Status: DC

## 2020-04-29 NOTE — Telephone Encounter (Signed)
Returned call to wife regarding Kepra solution refill. Dose currently is 5 ml BID (wife verified she was now giving 5 ml bid) dose had been 15 ml BID and then decreased to 5 ml bid. Wife was giving 10 ml at one point and med did not last as long as it should. Pharmacy would not refill. MD notified and refill sent in with note.  [name]

## 2020-05-10 ENCOUNTER — Ambulatory Visit (HOSPITAL_COMMUNITY): Payer: Medicare Other

## 2020-05-11 ENCOUNTER — Other Ambulatory Visit: Payer: Self-pay

## 2020-05-11 ENCOUNTER — Ambulatory Visit (HOSPITAL_COMMUNITY)
Admission: RE | Admit: 2020-05-11 | Discharge: 2020-05-11 | Disposition: A | Discharge status: Home / Self Care | Payer: Medicare Other | Source: Ambulatory Visit | Attending: Internal Medicine | Admitting: Internal Medicine

## 2020-05-11 DIAGNOSIS — H538 Other visual disturbances: Secondary | ICD-10-CM | POA: Diagnosis not present

## 2020-05-11 DIAGNOSIS — C719 Malignant neoplasm of brain, unspecified: Secondary | ICD-10-CM | POA: Diagnosis not present

## 2020-05-11 DIAGNOSIS — C711 Malignant neoplasm of frontal lobe: Secondary | ICD-10-CM | POA: Insufficient documentation

## 2020-05-11 DIAGNOSIS — R220 Localized swelling, mass and lump, head: Secondary | ICD-10-CM | POA: Diagnosis not present

## 2020-05-11 MED ORDER — GADOBUTROL 1 MMOL/ML IV SOLN
6.0000 mL | Freq: Once | INTRAVENOUS | Status: AC | PRN
  Administered 2020-05-11: 6 mL via INTRAVENOUS

## 2020-05-13 ENCOUNTER — Inpatient Hospital Stay: Payer: Medicare Other | Attending: Internal Medicine | Admitting: Internal Medicine

## 2020-05-13 ENCOUNTER — Other Ambulatory Visit: Payer: Self-pay

## 2020-05-13 ENCOUNTER — Inpatient Hospital Stay: Payer: Medicare Other

## 2020-05-13 VITALS — BP 130/54 | HR 75 | Temp 97.2°F | Resp 18 | Ht 64.0 in | Wt 139.4 lb

## 2020-05-13 DIAGNOSIS — Z87891 Personal history of nicotine dependence: Secondary | ICD-10-CM | POA: Insufficient documentation

## 2020-05-13 DIAGNOSIS — Z923 Personal history of irradiation: Secondary | ICD-10-CM | POA: Diagnosis not present

## 2020-05-13 DIAGNOSIS — Z9221 Personal history of antineoplastic chemotherapy: Secondary | ICD-10-CM | POA: Insufficient documentation

## 2020-05-13 DIAGNOSIS — C711 Malignant neoplasm of frontal lobe: Secondary | ICD-10-CM | POA: Diagnosis not present

## 2020-05-13 DIAGNOSIS — Z79899 Other long term (current) drug therapy: Secondary | ICD-10-CM | POA: Insufficient documentation

## 2020-05-13 DIAGNOSIS — Z7952 Long term (current) use of systemic steroids: Secondary | ICD-10-CM | POA: Insufficient documentation

## 2020-05-13 NOTE — Progress Notes (Signed)
[facility] [place], [place] [address] [phone]   Interval Evaluation  Date of Service: 05/13/20 Patient Name: Lan Mcneill Patient MRN: [id number] Patient DOB: 02-24-1941 Provider: Ventura Sellers, MD  Identifying Statement:  Momodou Consiglio is a 80 y.o. male with right frontal glioblastoma   Oncologic History: Oncology History   Glioblastoma, IDH-wildtype ([place])   03/07/2018 Surgery   Craniotomy, resection by Dr. Tommi Rumps ([facility]).  Path is Glioblastoma   03/29/2018 - 03/29/2018 Chemotherapy   The patient had temozolomide (TEMODAR) 140 MG capsule, 140 mg (100 % of original dose 140 mg), Oral, Daily, 1 of 1 cycle, Start date: 03/29/2018, End date: 05/10/2018 Dose modification: 140 mg (original dose 140 mg, Cycle 1)  for chemotherapy treatment.    04/04/2018 - 05/13/2018 Radiation Therapy   IMRT and concurrent Temozolomide with Dr. Tammi Klippel   06/09/2018 - 01/02/2019 Chemotherapy   The patient completed 6 cycles of adjuvant 5-day Temozolomide       Biomarkers:  MGMT Unknown.  IDH 1/2 Wild type.  EGFR Expressed  TERT Unknown   Interval History: Izacc Demeyer presents today for follow up after MRI brain.  Continues to describe same left sided weakness burden.  He has had a couple of falls at home.  Speaking/fluency is otherwise stable from prior.  He is not independent with any ADLs at this time, sleep volume and fatigue burden are quite high.  History is overall still limited by language impairments, wife assisted again with history.    H+P (02/16/18) [name] 80 y.o. male presented after family noted drooping of the left side of his face yesterday.  In addition, he/they describe some cognitive changes, such as difficulty "finding words" in certain situations.  The patient also feels like his left leg is "lagging" a little behind his right lately.  He is walking on his own, however, and with no falls.  Denies headaches,  seizures.   Medications: Current Outpatient Medications on File Prior to Visit  Medication Sig Dispense Refill  . acetaminophen (TYLENOL) 325 MG tablet Take 2 tablets (650 mg total) by mouth every 6 (six) hours as needed for mild pain.    . cetirizine-pseudoephedrine (ZYRTEC-D ALLERGY & CONGESTION) 5-120 MG tablet Take 1 tablet by mouth 2 (two) times daily.    . Cholecalciferol (D-3-5) 125 MCG (5000 UT) capsule Take 1 capsule by mouth daily.    . Cyanocobalamin (B-12) 1000 MCG LOZG Take 2 lozenges by mouth daily.    [name] dexamethasone (DECADRON) 1 MG tablet TAKE 1 TABLET EVERY DAY 90 tablet 1  . fluticasone (FLONASE) 50 MCG/ACT nasal spray Place 1 spray into both nostrils 2 (two) times daily.    [name] levETIRAcetam (KEPPRA) 100 MG/ML solution TAKE 5 ML (500 MG TOTAL) BY MOUTH TWO  TIMES DAILY. 900 mL 1  . levothyroxine (SYNTHROID, LEVOTHROID) 75 MCG tablet Take 1 tablet (75 mcg total) by mouth daily before breakfast. 30 tablet 0  . zonisamide (ZONEGRAN) 100 MG capsule TAKE 1 CAPSULE EVERY DAY 90 capsule 3   No current facility-administered medications on file prior to visit.    Allergies:  Allergies  Allergen Reactions  . Other     Cats, pollen   Past Medical History:  Past Medical History:  Diagnosis Date  . Arthritis   . Brain cancer ([place])    Glioblastoma  . Diabetes mellitus without complication ([place])   . GERD (gastroesophageal reflux disease)   . Hypercholesterolemia   .  Hypertension   . Thyroid disease    Past Surgical History:  Past Surgical History:  Procedure Laterality Date  . CRANIOTOMY    . HERNIA REPAIR    . 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 Marital status: Married    Spouse name: Not on file  . Number of children: Not on file  . Years of education: Not on file  . Highest education level: Not on file  Occupational History  . Not on file  Tobacco Use  . Smoking  status: Former Smoker    Packs/day: 0.25    Years: 3.00    Pack years: 0.75    Types: Cigarettes    Quit date: 03/23/1961    Years since quitting: 59.1  . Smokeless tobacco: Never Used  Vaping Use  . Vaping Use: Never used  Substance and Sexual Activity  . Alcohol use: Yes    Alcohol/week: 1.0 standard drink    Types: 1 Glasses of wine per week    Comment: weekly  . Drug use: Never  . Sexual activity: Not Currently  Other Topics Concern  . Not on file  Social History Narrative   Married. Resides in [place].   Social Determinants of Health   Financial Resource Strain: Not on file  Food Insecurity: Not on file  Transportation Needs: Not on file  Physical Activity: Not on file  Stress: Not on file  Social Connections: Not on file  Intimate Partner Violence: Not on file   Family History:  Family History  Problem Relation Age of Onset  . Diabetes Mellitus II Mother   . ALS Father     Review of Systems: Constitutional: Denies fevers, chills or abnormal weight loss Eyes: Denies blurriness of vision Ears, nose, mouth, throat, and face: Denies mucositis or sore throat Respiratory: Denies cough, dyspnea or wheezes Cardiovascular: Denies palpitation, chest discomfort or lower extremity swelling Gastrointestinal:  Denies nausea, constipation, diarrhea GU: Denies dysuria or incontinence Skin: Denies abnormal skin rashes Neurological: Per HPI Musculoskeletal: Denies joint pain, back or neck discomfort. No decrease in ROM Behavioral/Psych: Denies anxiety, disturbance in thought content, and mood instability  Physical Exam: Vitals:   05/13/20 1151  BP: (!) 130/54  Pulse: 75  Resp: 18  Temp: (!) 97.2 F (36.2 C)  SpO2: 100%   KPS: 60. General: Alert, cooperative, pleasant, in no acute distress Head: Craniotomy scar noted, dry and intact. EENT: No conjunctival injection or scleral icterus. Oral mucosa moist Lungs: Resp effort normal Cardiac: Regular rate and  rhythm Abdomen: Soft, non-distended abdomen Skin: No rashes cyanosis or petechiae. Extremities: no edema  Neurologic Exam: Mental Status: Awake, alert, attentive to examiner. Oriented to self and environment. Language is fairly densely impaired with regards to fluency, comprehension and repetition.   Cranial Nerves: Visual acuity is grossly normal. Visual fields are full. Extra-ocular movements intact. No ptosis. L UMN facial paresis, tongue midline. Motor: Tone and bulk are normal. Power is 3/5 in left arm and 2/5 in leg, with absent fine motor function in left hand.  Left thenar atrophy noted. Reflexes are symmetric, no pathologic reflexes present. Intact finger to nose bilaterally Sensory: Intact to light touch and temperature Gait: Deferred  Labs: I have reviewed the data as listed    Component Value Date/Time   NA 141 03/06/2019 1026   K 4.1 03/06/2019 1026   CL 104 03/06/2019 1026  CO2 26 03/06/2019 1026   GLUCOSE 168 (H) 03/06/2019 1026   BUN 27 (H) 03/06/2019 1026   CREATININE 1.21 03/06/2019 1026   CALCIUM 9.4 03/06/2019 1026   PROT 6.4 (L) 03/06/2019 1026   ALBUMIN 3.9 03/06/2019 1026   AST 12 (L) 03/06/2019 1026   ALT 16 03/06/2019 1026   ALKPHOS 45 03/06/2019 1026   BILITOT 0.3 03/06/2019 1026   GFRNONAA 57 (L) 03/06/2019 1026   GFRAA >60 03/06/2019 1026   Lab Results  Component Value Date   WBC 5.7 03/06/2019   NEUTROABS 3.9 03/06/2019   HGB 11.9 (L) 03/06/2019   HCT 34.3 (L) 03/06/2019   MCV 99.4 03/06/2019   PLT 190 03/06/2019    Imaging:  [facility] Clinician Interpretation: I have personally reviewed the CNS images as listed.  My interpretation, in the context of the patient's clinical presentation, is progressive disease  MR BRAIN W WO CONTRAST  Result Date: 05/12/2020 CLINICAL DATA:  Glioblastoma surveillance EXAM: MRI HEAD WITHOUT AND WITH CONTRAST TECHNIQUE: Multiplanar, multiecho pulse sequences of the brain and surrounding structures were obtained  without and with intravenous contrast. CONTRAST:  9m GADAVIST GADOBUTROL 1 MMOL/ML IV SOLN COMPARISON:  02/02/2020 FINDINGS: Brain: Treated glioblastoma in the right cerebral hemisphere with cavitary rim enhancing lesion centered at the insula and lateral frontal lobe with stable enhancement, chronic blood products, and T2 hyperintensity. There is however progressive T2 signal, swelling, and cortical blurring with hazy cortically based enhancement (see coronal postcontrast imaging) at the posterior treatment area, deep to the posterior margin of the bone flap. The enhancement in this area is amorphous and precise measurement is not reproducible. New small crescentic area of enhancement along the body of the right lateral ventricle, not associated with change in T2 signal, suspect radiation changes in this location. No visible CSF spread. Vascular: Preserved flow voids and vascular enhancements. Skull and upper cervical spine: Unremarkable right craniotomy Sinuses/Orbits: Chronically opacified left maxillary sinus. IMPRESSION: 1. Progressive findings at the posterior margin of the mass. 2. Small area of new subependymal enhancement along the right lateral ventricle which may be radiation related. Electronically Signed   By: JMonte FantasiaM.D.   On: 05/12/2020 11:07    Assessment/Plan 1.  Glioblastoma, IDH-wildtype ([facility]   Mr. MPimentais clinically stable today.  MRI brain demonstrates increased new region of enhancement within posterior margins, as well as new nodule along ependymal surface.  The former is associated with T2 signal abnormality which is accompanied by mass effect.  These findings are highly suspicious for progression of disease.  We extensively discussed options for treatment moving forward, with 4 considerations: -Resuming 5-day Temodar, which he never progressed through -Metronomic TMZ, consideration given poor functional status -Deferring systemic therapy, repeating MRI brain in 6  weeks for better lesion characterization -Referral for hospice care  We discussed the potential risks and benefits of each pathway, with understanding of therapeutic limitations related to his age, language, motor function.  EBerton Bonand his family will go home and discuss their options, then return to [place] phone call with decision and/or additional goals of care questions.  Decadron may continue at 151mdaily.  He should con't Keppra 100028mID and con't Zonisamide 100m19mD for seizure prevention.  We appreciate the opportunity to participate in the care of EdwaBerton Bone will follow up with him soon after family discussion.  I have spent a total of 40 minutes of face-to-face and non-face-to-face time, excluding clinical staff time, preparing to  see patient, ordering tests and/or medications, counseling the patient, and independently interpreting results and communicating results to the patient/family/caregiver    Ventura Sellers, MD Medical Director of Neuro-Oncology [facility] at [facility] 05/13/20 12:13 PM

## 2020-05-15 ENCOUNTER — Telehealth: Payer: Self-pay

## 2020-05-15 DIAGNOSIS — C711 Malignant neoplasm of frontal lobe: Secondary | ICD-10-CM

## 2020-05-15 NOTE — Telephone Encounter (Signed)
Pts wife left a message stating they have reached a tx decision  I called her back and she relayed the following:  Restart the 5 day Temodar for one month then reassess to see how he is doing.

## 2020-05-16 ENCOUNTER — Other Ambulatory Visit: Payer: Self-pay | Admitting: Internal Medicine

## 2020-05-16 ENCOUNTER — Telehealth: Payer: Self-pay

## 2020-05-16 ENCOUNTER — Telehealth: Payer: Self-pay | Admitting: Pharmacist

## 2020-05-16 DIAGNOSIS — C711 Malignant neoplasm of frontal lobe: Secondary | ICD-10-CM

## 2020-05-16 MED ORDER — TEMOZOLOMIDE 20 MG PO CAPS: 20.0000 mg | ORAL_CAPSULE | Freq: Every day | ORAL | 0 refills | Status: DC

## 2020-05-16 MED ORDER — TEMOZOLOMIDE 140 MG PO CAPS: 140.0000 mg | ORAL_CAPSULE | Freq: Every day | ORAL | 0 refills | Status: DC

## 2020-05-16 MED ORDER — TEMOZOLOMIDE 100 MG PO CAPS: 100.0000 mg | ORAL_CAPSULE | Freq: Every day | ORAL | 0 refills | Status: DC

## 2020-05-16 NOTE — Telephone Encounter (Signed)
Oral Oncology Patient Advocate Encounter   Was successful in securing patient an $43 grant from Patient [facility]) to provide copayment coverage for Temodar.  This will keep the out of pocket expense at $0.     I have spoken with the patient.    The billing information is as follows and has been shared with [place].   Member ID: [id number] Group ID: [id number] RxBin: [id number] Dates of Eligibility: 05/16/20 through 05/15/21  Fund:  [place] Patient [place] Phone [phone] Fax [phone] 05/16/2020 11:00 AM

## 2020-05-16 NOTE — Telephone Encounter (Signed)
Spoke with Mr. Luffman, his wife, and daughter regarding their treatment plan decision.  We agreed on resuming treatment with Temozolomide 150mg /m2, on for five days and off for twenty three days in twenty eight day cycles. The patient will have a complete blood count performed on days 21 and 28 of each cycle, and a comprehensive metabolic panel performed on day 28 of each cycle. Labs may need to be performed more often. Zofran will prescribed for home use for nausea/vomiting.   Informed consent was obtained verbally at bedside to proceed with oral chemotherapy.  Chemotherapy should be held for the following:  ANC less than 1,000  Platelets less than 100,000  LFT or creatinine greater than 2x ULN  If clinical concerns/contraindications develop  They will follow up in clinic in 1 month for re-evaluation  Ventura Sellers, MD

## 2020-05-16 NOTE — Telephone Encounter (Signed)
Oral Oncology Patient Advocate Encounter  After completing a benefits investigation, prior authorization for Temodar is not required at this time through Medicare B.  Patient's copay is 140mg  $73.42         100mg  $59.30           20mg  $31.06  [place] Patient [place] Phone [phone] Fax [phone] 05/16/2020 9:34 AM

## 2020-05-16 NOTE — Telephone Encounter (Addendum)
Oral Oncology Pharmacist Encounter  Received new prescription for Temodar (temozolomide) for the treatment of glioblastoma, planned duration until disease progression or unacceptable drug toxicity.  Prescription dose and frequency assessed for appropriateness. Confirmed with Dr. Mickeal Skinner dose reduction from 260 mg to 240 mg total daily dose given patient's clinical condition (dose still within 10% dose range based on BSA of 1.69 m2 and dose of 150 mg/m2 for cycle 1). Orders updated to reflect change. Treatment initiation pending baseline labs. Patient will need Rx for ondansetron for antiemetic PPX.   Per MD patient to have baseline labs prior to therapy initiation - last CBC w/ Diff and CMP are from 03/06/19. Will follow up baseline labs.   Current medication list in Epic reviewed, no relevant/significant DDIs with Temodar identified.  Evaluated chart and no patient barriers to medication adherence noted.   Patient agreement for Temodar treatment documented in MD note on 05/16/20.  Prescription has been e-scribed to the [facility] for benefits analysis and approval.  Oral Oncology Clinic will continue to follow for insurance authorization, copayment issues, initial counseling and start date.  Leron Croak, PharmD, BCPS Hematology/Oncology Clinical Pharmacist [facility] [phone] 05/16/2020 12:40 PM

## 2020-05-17 ENCOUNTER — Other Ambulatory Visit: Payer: Self-pay | Admitting: Internal Medicine

## 2020-05-17 MED ORDER — ONDANSETRON HCL 8 MG PO TABS: 8.0000 mg | ORAL_TABLET | Freq: Two times a day (BID) | ORAL | 1 refills | Status: DC | PRN

## 2020-05-17 MED FILL — ONDANSETRON HCL 8 MG TABLET: 8 | 15 days supply | Qty: 30 | Fill #0

## 2020-05-20 ENCOUNTER — Other Ambulatory Visit: Payer: Self-pay

## 2020-05-20 ENCOUNTER — Inpatient Hospital Stay: Payer: Medicare Other

## 2020-05-20 DIAGNOSIS — Z923 Personal history of irradiation: Secondary | ICD-10-CM | POA: Diagnosis not present

## 2020-05-20 DIAGNOSIS — C711 Malignant neoplasm of frontal lobe: Secondary | ICD-10-CM | POA: Diagnosis not present

## 2020-05-20 DIAGNOSIS — Z9221 Personal history of antineoplastic chemotherapy: Secondary | ICD-10-CM | POA: Diagnosis not present

## 2020-05-20 DIAGNOSIS — Z7952 Long term (current) use of systemic steroids: Secondary | ICD-10-CM | POA: Diagnosis not present

## 2020-05-20 DIAGNOSIS — Z87891 Personal history of nicotine dependence: Secondary | ICD-10-CM | POA: Diagnosis not present

## 2020-05-20 DIAGNOSIS — Z79899 Other long term (current) drug therapy: Secondary | ICD-10-CM | POA: Diagnosis not present

## 2020-05-20 LAB — CBC WITH DIFFERENTIAL (CANCER CENTER ONLY)
Abs Immature Granulocytes: 0.04 10*3/uL (ref 0.00–0.07)
Basophils Absolute: 0 10*3/uL (ref 0.0–0.1)
Basophils Relative: 1 %
Eosinophils Absolute: 0.1 10*3/uL (ref 0.0–0.5)
Eosinophils Relative: 1 %
HCT: 36.8 % — ABNORMAL LOW (ref 39.0–52.0)
Hemoglobin: 12 g/dL — ABNORMAL LOW (ref 13.0–17.0)
Immature Granulocytes: 1 %
Lymphocytes Relative: 13 %
Lymphs Abs: 1 10*3/uL (ref 0.7–4.0)
MCH: 31.5 pg (ref 26.0–34.0)
MCHC: 32.6 g/dL (ref 30.0–36.0)
MCV: 96.6 fL (ref 80.0–100.0)
Monocytes Absolute: 0.6 10*3/uL (ref 0.1–1.0)
Monocytes Relative: 7 %
Neutro Abs: 5.9 10*3/uL (ref 1.7–7.7)
Neutrophils Relative %: 77 %
Platelet Count: 211 10*3/uL (ref 150–400)
RBC: 3.81 MIL/uL — ABNORMAL LOW (ref 4.22–5.81)
RDW: 12.7 % (ref 11.5–15.5)
WBC Count: 7.5 10*3/uL (ref 4.0–10.5)
nRBC: 0 % (ref 0.0–0.2)

## 2020-05-20 LAB — CMP (CANCER CENTER ONLY)
ALT: 16 U/L (ref 0–44)
AST: 12 U/L — ABNORMAL LOW (ref 15–41)
Albumin: 3.5 g/dL (ref 3.5–5.0)
Alkaline Phosphatase: 60 U/L (ref 38–126)
Anion gap: 7 (ref 5–15)
BUN: 17 mg/dL (ref 8–23)
CO2: 25 mmol/L (ref 22–32)
Calcium: 9.1 mg/dL (ref 8.9–10.3)
Chloride: 108 mmol/L (ref 98–111)
Creatinine: 1.12 mg/dL (ref 0.61–1.24)
GFR, Estimated: >60 mL/min (ref >60)
Glucose, Bld: 131 mg/dL — ABNORMAL HIGH (ref 70–99)
Potassium: 4 mmol/L (ref 3.5–5.1)
Sodium: 140 mmol/L (ref 135–145)
Total Bilirubin: 0.2 mg/dL — ABNORMAL LOW (ref 0.3–1.2)
Total Protein: 6.2 g/dL — ABNORMAL LOW (ref 6.5–8.1)

## 2020-05-20 MED FILL — TEMOZOLOMIDE 140 MG CAPS: 140 | 28 days supply | Qty: 5 | Fill #0

## 2020-05-20 MED FILL — TEMOZOLOMIDE 100 MG CAPS: 100 | 28 days supply | Qty: 5 | Fill #0

## 2020-05-20 NOTE — Telephone Encounter (Signed)
Oral Chemotherapy Pharmacist Encounter  I spoke with patient's wife, Stancil Deisher, for overview of: Temodar (temozolomide) for the maintenance treatment of glioblastoma multiforme, planned duration 6-12 months of treatment.  Counseled on administration, dosing, side effects, monitoring, drug-food interactions, safe handling, storage, and disposal.  Patient will take Temodar 100mg  capsules and Temodar 140mg  capsules, 240mg  total daily dose, by mouth once daily, may take at bedtime and on an empty stomach to decrease nausea and vomiting.  If 1st cycle is well tolerated, wife informed that Temodar dose may be increased to 200 mg/m2 daily for 5 days on, 23 days off, repeated every 28 days for subsequent cycles   Patient will take Temodar daily for 5 days on, 23 days off, and repeated.  Temodar start date: 05/20/20 PM   Patient will take Zofran 8mg  tablet, 1 tablet by mouth 30-60 min prior to Temodar dose to help decrease N/V.   Adverse effects include but are not limited to: nausea, vomiting, anorexia, GI upset, rash, drug fever, and fatigue. Rare but serious adverse effects of pneumocystis pneumonia and secondary malignancy also discussed.  We discussed strategies to manage constipation if they occur secondary to ondansetron dosing.  PCP prophylaxis will not be initiated at this time, but may be added based on lymphocyte count in the future.  Reviewed importance of keeping a medication schedule and plan for any missed doses. No barriers to medication adherence identified.  Medication reconciliation performed and medication/allergy list updated.  Insurance authorization for Temodar has been obtained. Test claim at the pharmacy revealed copayment $132.72 for 1st fill of Temodar. Fatima Sanger obtained for patient bringing out of pocket cost to $0 Patient's wife will pick medication up from the [facility] on 05/20/20.   Ms. Tavano informed the pharmacy will reach out 5-7 days  prior to needing next fill of Temodar to coordinate continued medication acquisition to prevent break in therapy.  All questions answered.  Ms. Williamsen voiced understanding and appreciation.   Medication education handout placed in mail for patient and family. Patient's family knows to call the office with questions or concerns. Oral Chemotherapy Clinic phone number provided to patient.   Leron Croak, PharmD, BCPS Hematology/Oncology Clinical Pharmacist [facility] [phone] 05/20/2020 2:14 PM

## 2020-05-22 ENCOUNTER — Telehealth: Payer: Self-pay | Admitting: Internal Medicine

## 2020-05-22 NOTE — Telephone Encounter (Signed)
Scheduled per 2/28 sch msg. Called and spoke with pt wife, confirmed 3/28 appts

## 2020-06-17 ENCOUNTER — Inpatient Hospital Stay: Payer: Medicare Other | Attending: Internal Medicine | Admitting: Internal Medicine

## 2020-06-17 ENCOUNTER — Inpatient Hospital Stay: Payer: Medicare Other

## 2020-06-17 ENCOUNTER — Other Ambulatory Visit: Payer: Self-pay

## 2020-06-17 ENCOUNTER — Other Ambulatory Visit: Payer: Self-pay | Admitting: Internal Medicine

## 2020-06-17 VITALS — BP 130/67 | HR 62 | Temp 96.1°F | Resp 17 | Ht 64.0 in | Wt 143.6 lb

## 2020-06-17 DIAGNOSIS — K219 Gastro-esophageal reflux disease without esophagitis: Secondary | ICD-10-CM | POA: Diagnosis not present

## 2020-06-17 DIAGNOSIS — R4189 Other symptoms and signs involving cognitive functions and awareness: Secondary | ICD-10-CM | POA: Insufficient documentation

## 2020-06-17 DIAGNOSIS — I10 Essential (primary) hypertension: Secondary | ICD-10-CM | POA: Insufficient documentation

## 2020-06-17 DIAGNOSIS — C711 Malignant neoplasm of frontal lobe: Secondary | ICD-10-CM

## 2020-06-17 DIAGNOSIS — E7800 Pure hypercholesterolemia, unspecified: Secondary | ICD-10-CM | POA: Diagnosis not present

## 2020-06-17 DIAGNOSIS — Z79899 Other long term (current) drug therapy: Secondary | ICD-10-CM | POA: Diagnosis not present

## 2020-06-17 DIAGNOSIS — Z9221 Personal history of antineoplastic chemotherapy: Secondary | ICD-10-CM | POA: Insufficient documentation

## 2020-06-17 DIAGNOSIS — Z923 Personal history of irradiation: Secondary | ICD-10-CM | POA: Diagnosis not present

## 2020-06-17 DIAGNOSIS — E119 Type 2 diabetes mellitus without complications: Secondary | ICD-10-CM | POA: Diagnosis not present

## 2020-06-17 DIAGNOSIS — Z87891 Personal history of nicotine dependence: Secondary | ICD-10-CM | POA: Diagnosis not present

## 2020-06-17 DIAGNOSIS — E079 Disorder of thyroid, unspecified: Secondary | ICD-10-CM | POA: Diagnosis not present

## 2020-06-17 DIAGNOSIS — M1990 Unspecified osteoarthritis, unspecified site: Secondary | ICD-10-CM | POA: Insufficient documentation

## 2020-06-17 LAB — CBC WITH DIFFERENTIAL (CANCER CENTER ONLY)
Abs Immature Granulocytes: 0.02 10*3/uL (ref 0.00–0.07)
Basophils Absolute: 0 10*3/uL (ref 0.0–0.1)
Basophils Relative: 1 %
Eosinophils Absolute: 0 10*3/uL (ref 0.0–0.5)
Eosinophils Relative: 1 %
HCT: 34.7 % — ABNORMAL LOW (ref 39.0–52.0)
Hemoglobin: 11.9 g/dL — ABNORMAL LOW (ref 13.0–17.0)
Immature Granulocytes: 0 %
Lymphocytes Relative: 14 %
Lymphs Abs: 0.8 10*3/uL (ref 0.7–4.0)
MCH: 32.2 pg (ref 26.0–34.0)
MCHC: 34.3 g/dL (ref 30.0–36.0)
MCV: 94 fL (ref 80.0–100.0)
Monocytes Absolute: 0.6 10*3/uL (ref 0.1–1.0)
Monocytes Relative: 9 %
Neutro Abs: 4.5 10*3/uL (ref 1.7–7.7)
Neutrophils Relative %: 75 %
Platelet Count: 164 10*3/uL (ref 150–400)
RBC: 3.69 MIL/uL — ABNORMAL LOW (ref 4.22–5.81)
RDW: 13.3 % (ref 11.5–15.5)
WBC Count: 5.9 10*3/uL (ref 4.0–10.5)
nRBC: 0 % (ref 0.0–0.2)

## 2020-06-17 LAB — CMP (CANCER CENTER ONLY)
ALT: 26 U/L (ref 0–44)
AST: 14 U/L — ABNORMAL LOW (ref 15–41)
Albumin: 3.6 g/dL (ref 3.5–5.0)
Alkaline Phosphatase: 53 U/L (ref 38–126)
Anion gap: 8 (ref 5–15)
BUN: 21 mg/dL (ref 8–23)
CO2: 24 mmol/L (ref 22–32)
Calcium: 8.8 mg/dL — ABNORMAL LOW (ref 8.9–10.3)
Chloride: 107 mmol/L (ref 98–111)
Creatinine: 1.01 mg/dL (ref 0.61–1.24)
GFR, Estimated: >60 mL/min (ref >60)
Glucose, Bld: 121 mg/dL — ABNORMAL HIGH (ref 70–99)
Potassium: 4.2 mmol/L (ref 3.5–5.1)
Sodium: 139 mmol/L (ref 135–145)
Total Bilirubin: <0.2 mg/dL — ABNORMAL LOW (ref 0.3–1.2)
Total Protein: 6.2 g/dL — ABNORMAL LOW (ref 6.5–8.1)

## 2020-06-17 MED ORDER — TEMOZOLOMIDE 100 MG PO CAPS: 100.0000 mg | ORAL_CAPSULE | Freq: Every day | ORAL | 0 refills | Status: DC

## 2020-06-17 MED ORDER — TEMOZOLOMIDE 140 MG PO CAPS: 140.0000 mg | ORAL_CAPSULE | Freq: Every day | ORAL | 0 refills | Status: DC

## 2020-06-17 NOTE — Progress Notes (Signed)
[facility] at [place], [place] [address] [phone]   Interval Evaluation  Date of Service: 06/17/20 Patient Name: Bryan [name] Patient MRN: [id number] Patient DOB: 06/23/1940 Provider: Ventura Sellers, MD  Identifying Statement:  Bryan [name] is a 80 y.o. male with right frontal glioblastoma   Oncologic History: Oncology History   Glioblastoma, IDH-wildtype ([place])   03/07/2018 Surgery   Craniotomy, resection by Dr. Tommi Rumps ([facility]).  Path is Glioblastoma   03/29/2018 - 03/29/2018 Chemotherapy   The patient had temozolomide (TEMODAR) 140 MG capsule, 140 mg (100 % of original dose 140 mg), Oral, Daily, 1 of 1 cycle, Start date: 03/29/2018, End date: 05/10/2018 Dose modification: 140 mg (original dose 140 mg, Cycle 1)  for chemotherapy treatment.    04/04/2018 - 05/13/2018 Radiation Therapy   IMRT and concurrent Temozolomide with Dr. Tammi Klippel   06/09/2018 - 01/02/2019 Chemotherapy   The patient completed 6 cycles of adjuvant 5-day Temozolomide       Biomarkers:  MGMT Unknown.  IDH 1/2 Wild type.  EGFR Expressed  TERT Unknown   Interval History: Bryan [name] presents today for follow up after recent cycle of Temozolomide.  Tolerated chemo well, no significant issues. No changes regarding left sided weakness burden.  No falls this month. Speaking/fluency is otherwise stable from prior.  He is not independent with any ADLs at this time, sleep volume and fatigue burden are quite high.  History is overall still limited by language impairments, wife assisted again with history.    H+P (02/16/18) Bryan [name] 80 y.o. male presented after family noted drooping of the left side of his face yesterday.  In addition, he/they describe some cognitive changes, such as difficulty "finding words" in certain situations.  The patient also feels like his left leg is "lagging" a little behind his right lately.  He is walking on his own, however,  and with no falls.  Denies headaches, seizures.   Medications: Current Outpatient Medications on File Prior to Visit  Medication Sig Dispense Refill  . acetaminophen (TYLENOL) 325 MG tablet Take 2 tablets (650 mg total) by mouth every 6 (six) hours as needed for mild pain.    . cetirizine-pseudoephedrine (ZYRTEC-D) 5-120 MG tablet Take 1 tablet by mouth 2 (two) times daily.    . Cholecalciferol (D-3-5) 125 MCG (5000 UT) capsule Take 1 capsule by mouth daily.    . Cyanocobalamin (B-12) 1000 MCG LOZG Take 2 lozenges by mouth daily.    [name] dexamethasone (DECADRON) 1 MG tablet TAKE 1 TABLET EVERY DAY 90 tablet 1  . fluticasone (FLONASE) 50 MCG/ACT nasal spray Place 1 spray into both nostrils 2 (two) times daily.    [name] levETIRAcetam (KEPPRA) 100 MG/ML solution TAKE 5 ML (500 MG TOTAL) BY MOUTH TWO  TIMES DAILY. 900 mL 1  . levothyroxine (SYNTHROID, LEVOTHROID) 75 MCG tablet Take 1 tablet (75 mcg total) by mouth daily before breakfast. 30 tablet 0  . ondansetron (ZOFRAN) 8 MG tablet Take 1 tablet (8 mg total) by mouth 2 (two) times daily as needed for nausea or vomiting. May take 30 - 60 minutes prior to Temodar administration if nausea/vomiting occurs. 30 tablet 1  . temozolomide (TEMODAR) 100 MG capsule Take 1 capsule (100 mg total) by mouth daily. Take for 5 days on, 23 d off, repeat every 28d. May take on an empty stomach to decrease N/V 5 capsule 0  . temozolomide (TEMODAR) 140 MG capsule Take 1 capsule (140 mg  total) by mouth daily. Take for 5 days on, 23 d off, repeat every 28d. May take on an empty stomach to decrease N/V 5 capsule 0  . zonisamide (ZONEGRAN) 100 MG capsule TAKE 1 CAPSULE EVERY DAY 90 capsule 3   No current facility-administered medications on file prior to visit.    Allergies:  Allergies  Allergen Reactions  . Other     Cats, pollen   Past Medical History:  Past Medical History:  Diagnosis Date  . Arthritis   . Brain cancer ([place])    Glioblastoma  . Diabetes mellitus  without complication ([place])   . GERD (gastroesophageal reflux disease)   . Hypercholesterolemia   . Hypertension   . Thyroid disease    Past Surgical History:  Past Surgical History:  Procedure Laterality Date  . CRANIOTOMY    . HERNIA REPAIR    . 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 Marital status: Married    Spouse name: Not on file  . Number of children: Not on file  . Years of education: Not on file  . Highest education level: Not on file  Occupational History  . Not on file  Tobacco Use  . Smoking status: Former Smoker    Packs/day: 0.25    Years: 3.00    Pack years: 0.75    Types: Cigarettes    Quit date: 03/23/1961    Years since quitting: 59.2  . Smokeless tobacco: Never Used  Vaping Use  . Vaping Use: Never used  Substance and Sexual Activity  . Alcohol use: Yes    Alcohol/week: 1.0 standard drink    Types: 1 Glasses of wine per week    Comment: weekly  . Drug use: Never  . Sexual activity: Not Currently  Other Topics Concern  . Not on file  Social History Narrative   Married. Resides in [place].   Social Determinants of Health   Financial Resource Strain: Not on file  Food Insecurity: Not on file  Transportation Needs: Not on file  Physical Activity: Not on file  Stress: Not on file  Social Connections: Not on file  Intimate Partner Violence: Not on file   Family History:  Family History  Problem Relation Age of Onset  . Diabetes Mellitus II Mother   . ALS Father     Review of Systems: Constitutional: Denies fevers, chills or abnormal weight loss Eyes: Denies blurriness of vision Ears, nose, mouth, throat, and face: Denies mucositis or sore throat Respiratory: Denies cough, dyspnea or wheezes Cardiovascular: Denies palpitation, chest discomfort or lower extremity swelling Gastrointestinal:  Denies nausea, constipation, diarrhea GU: Denies dysuria  or incontinence Skin: Denies abnormal skin rashes Neurological: Per HPI Musculoskeletal: Denies joint pain, back or neck discomfort. No decrease in ROM Behavioral/Psych: Denies anxiety, disturbance in thought content, and mood instability  Physical Exam: Vitals:   06/17/20 1213  BP: 130/67  Pulse: 62  Resp: 17  Temp: (!) 96.1 F (35.6 C)  SpO2: 100%   KPS: 60. General: Alert, cooperative, pleasant, in no acute distress Head: Craniotomy scar noted, dry and intact. EENT: No conjunctival injection or scleral icterus. Oral mucosa moist Lungs: Resp effort normal Cardiac: Regular rate and rhythm Abdomen: Soft, non-distended abdomen Skin: No rashes cyanosis or petechiae. Extremities: no edema  Neurologic Exam: Mental Status: Awake, alert, attentive to examiner. Oriented to self and environment. Language is fairly densely impaired  with regards to fluency, comprehension and repetition.   Cranial Nerves: Visual acuity is grossly normal. Visual fields are full. Extra-ocular movements intact. No ptosis. L UMN facial paresis, tongue midline. Motor: Tone and bulk are normal. Power is 3/5 in left arm and 2/5 in leg, with absent fine motor function in left hand.  Left thenar atrophy noted. Reflexes are symmetric, no pathologic reflexes present. Intact finger to nose bilaterally Sensory: Intact to light touch and temperature Gait: Deferred  Labs: I have reviewed the data as listed    Component Value Date/Time   NA 140 05/20/2020 1232   K 4.0 05/20/2020 1232   CL 108 05/20/2020 1232   CO2 25 05/20/2020 1232   GLUCOSE 131 (H) 05/20/2020 1232   BUN 17 05/20/2020 1232   CREATININE 1.12 05/20/2020 1232   CALCIUM 9.1 05/20/2020 1232   PROT 6.2 (L) 05/20/2020 1232   ALBUMIN 3.5 05/20/2020 1232   AST 12 (L) 05/20/2020 1232   ALT 16 05/20/2020 1232   ALKPHOS 60 05/20/2020 1232   BILITOT 0.2 (L) 05/20/2020 1232   GFRNONAA >60 05/20/2020 1232   GFRAA >60 03/06/2019 1026   Lab Results   Component Value Date   WBC 5.9 06/17/2020   NEUTROABS 4.5 06/17/2020   HGB 11.9 (L) 06/17/2020   HCT 34.7 (L) 06/17/2020   MCV 94.0 06/17/2020   PLT 164 06/17/2020    Assessment/Plan 1.  Glioblastoma, IDH-wildtype [facility])   Bryan [name] is clinically stable today.  He tolerated resumption of Temozolomide well this month.  We recommended continuing treatment with cycle #8 Temozolomide at 180m/m2, on for five days and off for twenty three days in twenty eight day cycles. The patient will have a complete blood count performed on days 21 and 28 of each cycle, and a comprehensive metabolic panel performed on day 28 of each cycle. Labs may need to be performed more often. Zofran will prescribed for home use for nausea/vomiting.   Chemotherapy should be held for the following:  ANC less than 1,000  Platelets less than 100,000  LFT or creatinine greater than 2x ULN  If clinical concerns/contraindications develop  We ask that EBerton [name] to clinic in 1 months following next brain MRI, or sooner as needed.  Decadron may continue at 148mdaily.  He should con't Keppra 100092mID and con't Zonisamide 100m75mD for seizure prevention.  We appreciate the opportunity to participate in the care of Bryan [name] will follow up with him soon after family discussion.  I have spent a total of 30 minutes of face-to-face and non-face-to-face time, excluding clinical staff time, preparing to see patient, ordering tests and/or medications, counseling the patient, and independently interpreting results and communicating results to the patient/family/caregiver    ZachVentura Sellers Medical Director of Neuro-Oncology [facility] QueensWeslWalker28/22 12:30 PM

## 2020-06-18 MED FILL — TEMOZOLOMIDE 140 MG CAPS: 140 | 28 days supply | Qty: 5 | Fill #0

## 2020-06-18 MED FILL — TEMOZOLOMIDE 100 MG CAPS: 100 | 28 days supply | Qty: 5 | Fill #0

## 2020-06-24 ENCOUNTER — Other Ambulatory Visit: Payer: Self-pay | Admitting: Radiation Therapy

## 2020-07-10 ENCOUNTER — Other Ambulatory Visit (HOSPITAL_COMMUNITY): Payer: Self-pay

## 2020-07-12 ENCOUNTER — Ambulatory Visit
Admission: RE | Admit: 2020-07-12 | Discharge: 2020-07-12 | Disposition: A | Discharge status: Home / Self Care | Payer: Medicare Other | Source: Ambulatory Visit | Attending: Internal Medicine | Admitting: Internal Medicine

## 2020-07-12 ENCOUNTER — Other Ambulatory Visit: Payer: Self-pay

## 2020-07-12 ENCOUNTER — Other Ambulatory Visit (HOSPITAL_COMMUNITY): Payer: Self-pay

## 2020-07-12 DIAGNOSIS — C711 Malignant neoplasm of frontal lobe: Secondary | ICD-10-CM

## 2020-07-12 DIAGNOSIS — C719 Malignant neoplasm of brain, unspecified: Secondary | ICD-10-CM | POA: Diagnosis not present

## 2020-07-12 DIAGNOSIS — J0100 Acute maxillary sinusitis, unspecified: Secondary | ICD-10-CM | POA: Diagnosis not present

## 2020-07-12 DIAGNOSIS — G319 Degenerative disease of nervous system, unspecified: Secondary | ICD-10-CM | POA: Diagnosis not present

## 2020-07-12 DIAGNOSIS — G9389 Other specified disorders of brain: Secondary | ICD-10-CM | POA: Diagnosis not present

## 2020-07-12 MED ORDER — GADOBENATE DIMEGLUMINE 529 MG/ML IV SOLN
13.0000 mL | Freq: Once | INTRAVENOUS | Status: AC | PRN
  Administered 2020-07-12: 13 mL via INTRAVENOUS

## 2020-07-15 ENCOUNTER — Inpatient Hospital Stay: Payer: Medicare Other

## 2020-07-15 ENCOUNTER — Inpatient Hospital Stay (HOSPITAL_BASED_OUTPATIENT_CLINIC_OR_DEPARTMENT_OTHER): Payer: Medicare Other | Admitting: Internal Medicine

## 2020-07-15 ENCOUNTER — Inpatient Hospital Stay: Payer: Medicare Other | Attending: Internal Medicine

## 2020-07-15 ENCOUNTER — Other Ambulatory Visit (HOSPITAL_COMMUNITY): Payer: Self-pay

## 2020-07-15 ENCOUNTER — Other Ambulatory Visit: Payer: Self-pay

## 2020-07-15 VITALS — BP 133/53 | HR 73 | Temp 96.7°F | Resp 18 | Wt 150.1 lb

## 2020-07-15 DIAGNOSIS — Z87891 Personal history of nicotine dependence: Secondary | ICD-10-CM | POA: Diagnosis not present

## 2020-07-15 DIAGNOSIS — C711 Malignant neoplasm of frontal lobe: Secondary | ICD-10-CM

## 2020-07-15 LAB — CMP (CANCER CENTER ONLY)
ALT: 19 U/L (ref 0–44)
AST: 15 U/L (ref 15–41)
Albumin: 3.5 g/dL (ref 3.5–5.0)
Alkaline Phosphatase: 49 U/L (ref 38–126)
Anion gap: 7 (ref 5–15)
BUN: 22 mg/dL (ref 8–23)
CO2: 26 mmol/L (ref 22–32)
Calcium: 9 mg/dL (ref 8.9–10.3)
Chloride: 107 mmol/L (ref 98–111)
Creatinine: 1.15 mg/dL (ref 0.61–1.24)
GFR, Estimated: >60 mL/min (ref >60)
Glucose, Bld: 150 mg/dL — ABNORMAL HIGH (ref 70–99)
Potassium: 3.9 mmol/L (ref 3.5–5.1)
Sodium: 140 mmol/L (ref 135–145)
Total Bilirubin: 0.4 mg/dL (ref 0.3–1.2)
Total Protein: 6.1 g/dL — ABNORMAL LOW (ref 6.5–8.1)

## 2020-07-15 LAB — CBC WITH DIFFERENTIAL (CANCER CENTER ONLY)
Abs Immature Granulocytes: 0.03 10*3/uL (ref 0.00–0.07)
Basophils Absolute: 0 10*3/uL (ref 0.0–0.1)
Basophils Relative: 1 %
Eosinophils Absolute: 0.1 10*3/uL (ref 0.0–0.5)
Eosinophils Relative: 1 %
HCT: 34.8 % — ABNORMAL LOW (ref 39.0–52.0)
Hemoglobin: 11.7 g/dL — ABNORMAL LOW (ref 13.0–17.0)
Immature Granulocytes: 1 %
Lymphocytes Relative: 22 %
Lymphs Abs: 1.3 10*3/uL (ref 0.7–4.0)
MCH: 32.8 pg (ref 26.0–34.0)
MCHC: 33.6 g/dL (ref 30.0–36.0)
MCV: 97.5 fL (ref 80.0–100.0)
Monocytes Absolute: 0.7 10*3/uL (ref 0.1–1.0)
Monocytes Relative: 11 %
Neutro Abs: 3.6 10*3/uL (ref 1.7–7.7)
Neutrophils Relative %: 64 %
Platelet Count: 172 10*3/uL (ref 150–400)
RBC: 3.57 MIL/uL — ABNORMAL LOW (ref 4.22–5.81)
RDW: 13.4 % (ref 11.5–15.5)
WBC Count: 5.7 10*3/uL (ref 4.0–10.5)
nRBC: 0 % (ref 0.0–0.2)

## 2020-07-15 MED ORDER — TEMOZOLOMIDE 140 MG PO CAPS
140.0000 mg | ORAL_CAPSULE | Freq: Every day | ORAL | 0 refills | Status: DC
  Filled 2020-07-15: qty 5, 5d supply, fill #0
  Filled 2020-07-17: qty 5, 28d supply, fill #0

## 2020-07-15 MED ORDER — TEMOZOLOMIDE 100 MG PO CAPS
100.0000 mg | ORAL_CAPSULE | Freq: Every day | ORAL | 0 refills | Status: DC
Start: 2020-07-15 — End: 2020-08-20
  Filled 2020-07-15: qty 5, 5d supply, fill #0
  Filled 2020-07-17: qty 5, 28d supply, fill #0

## 2020-07-15 NOTE — Progress Notes (Signed)
[facility] [place], [place] [address] [phone]   Interval Evaluation  Date of Service: 07/15/20 Patient Name: Bryan [name] Patient MRN: [id number] Patient DOB: December 12, 1940 Provider: Ventura Sellers, MD  Identifying Statement:  Bryan [name] is a 80 y.o. male with right frontal glioblastoma   Oncologic History: Oncology History   Glioblastoma, IDH-wildtype ([place])   03/07/2018 Surgery   Craniotomy, resection by Dr. Tommi Rumps ([facility]).  Path is Glioblastoma   03/29/2018 - 03/29/2018 Chemotherapy   The patient had temozolomide (TEMODAR) 140 MG capsule, 140 mg (100 % of original dose 140 mg), Oral, Daily, 1 of 1 cycle, Start date: 03/29/2018, End date: 05/10/2018 Dose modification: 140 mg (original dose 140 mg, Cycle 1)  for chemotherapy treatment.    04/04/2018 - 05/13/2018 Radiation Therapy   IMRT and concurrent Temozolomide with Dr. Tammi Klippel   06/09/2018 - 01/02/2019 Chemotherapy   The patient completed 6 cycles of adjuvant 5-day Temozolomide     05/12/2020 Progression   Progression of disease lateral and inferior to resection cavity   05/12/2020 -  Chemotherapy   Resumes 5-day Temozolomide         Biomarkers:  MGMT Unknown.  IDH 1/2 Wild type.  EGFR Expressed  TERT Unknown   Interval History: Bryan [name] presents today for follow up after MRI brain.  He has now completed cycle #8 of 5-day TMZ, he again tolerated chemo well, no significant issues.  No changes regarding left sided weakness burden.  No falls this month. Speaking/fluency is otherwise stable from prior.  He is not independent with any ADLs at this time, sleep volume and fatigue burden are quite high.  History is overall still limited by language impairments, wife assisted again with history.    H+P (02/16/18) Bryan [name] 80 y.o. male presented after family noted drooping of the left side of his face yesterday.  In addition, he/they describe some  cognitive changes, such as difficulty "finding words" in certain situations.  The patient also feels like his left leg is "lagging" a little behind his right lately.  He is walking on his own, however, and with no falls.  Denies headaches, seizures.   Medications: Current Outpatient Medications on File Prior to Visit  Medication Sig Dispense Refill  . acetaminophen (TYLENOL) 325 MG tablet Take 2 tablets (650 mg total) by mouth every 6 (six) hours as needed for mild pain.    . cetirizine-pseudoephedrine (ZYRTEC-D) 5-120 MG tablet Take 1 tablet by mouth 2 (two) times daily.    . Cholecalciferol (D-3-5) 125 MCG (5000 UT) capsule Take 1 capsule by mouth daily.    . Cyanocobalamin (B-12) 1000 MCG LOZG Take 2 lozenges by mouth daily.    [name] dexamethasone (DECADRON) 1 MG tablet TAKE 1 TABLET EVERY DAY 90 tablet 1  . fluticasone (FLONASE) 50 MCG/ACT nasal spray Place 1 spray into both nostrils 2 (two) times daily.    [name] levETIRAcetam (KEPPRA) 100 MG/ML solution TAKE 5 ML (500 MG TOTAL) BY MOUTH TWO  TIMES DAILY. 900 mL 1  . levothyroxine (SYNTHROID, LEVOTHROID) 75 MCG tablet Take 1 tablet (75 mcg total) by mouth daily before breakfast. 30 tablet 0  . ondansetron (ZOFRAN) 8 MG tablet TAKE 1 TABLET BY MOUTH 2 TIMES DAILY FOR NAUSEA/VOMITING. MAY TAKE 30-60 MINUTES PRIOR TO TEMODAR IF NAUSEA/VOMITING OCCURS. 30 tablet 1  . temozolomide (TEMODAR) 100 MG capsule TAKE 1 CAPSULE BY MOUTH DAILY. TAKE FOR 5 DAYS ON, 23 DAYS OFF, REPEAT  EVERY 28 DAYS. MAY TAKE ON AN EMPTY STOMACH TO DECREASE NAUSEA AND VOMITTING 5 capsule 0  . temozolomide (TEMODAR) 100 MG capsule TAKE 1 CAPSULE (100 MG TOTAL) BY MOUTH DAILY. TAKE FOR 5 DAYS ON, 23 D OFF, REPEAT EVERY 28D. MAY TAKE ON AN EMPTY STOMACH TO DECREASE N/V 5 capsule 0  . temozolomide (TEMODAR) 140 MG capsule TAKE 1 CAPSULE BY MOUTH DAILY. TAKE FOR 5 DAYS ON, 23 DAYS OFF, REPEAT EVERY 28 DAYS. MAY TAKE ON AN EMPTY STOMACH TO DECREASE NAUSE AND VOMITTING 5 capsule 0  .  temozolomide (TEMODAR) 140 MG capsule TAKE 1 CAPSULE (140 MG TOTAL) BY MOUTH DAILY. TAKE FOR 5 DAYS ON, 23 D OFF, REPEAT EVERY 28D. MAY TAKE ON AN EMPTY STOMACH TO DECREASE N/V 5 capsule 0  . zonisamide (ZONEGRAN) 100 MG capsule TAKE 1 CAPSULE EVERY DAY 90 capsule 3   No current facility-administered medications on file prior to visit.    Allergies:  Allergies  Allergen Reactions  . Other     Cats, pollen   Past Medical History:  Past Medical History:  Diagnosis Date  . Arthritis   . Brain cancer ([place])    Glioblastoma  . Diabetes mellitus without complication ([place])   . GERD (gastroesophageal reflux disease)   . Hypercholesterolemia   . Hypertension   . Thyroid disease    Past Surgical History:  Past Surgical History:  Procedure Laterality Date  . CRANIOTOMY    . HERNIA REPAIR    . 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 Marital status: Married    Spouse name: Not on file  . Number of children: Not on file  . Years of education: Not on file  . Highest education level: Not on file  Occupational History  . Not on file  Tobacco Use  . Smoking status: Former Smoker    Packs/day: 0.25    Years: 3.00    Pack years: 0.75    Types: Cigarettes    Quit date: 03/23/1961    Years since quitting: 59.3  . Smokeless tobacco: Never Used  Vaping Use  . Vaping Use: Never used  Substance and Sexual Activity  . Alcohol use: Yes    Alcohol/week: 1.0 standard drink    Types: 1 Glasses of wine per week    Comment: weekly  . Drug use: Never  . Sexual activity: Not Currently  Other Topics Concern  . Not on file  Social History Narrative   Married. Resides in [place].   Social Determinants of Health   Financial Resource Strain: Not on file  Food Insecurity: Not on file  Transportation Needs: Not on file  Physical Activity: Not on file  Stress: Not on file  Social Connections: Not on  file  Intimate Partner Violence: Not on file   Family History:  Family History  Problem Relation Age of Onset  . Diabetes Mellitus II Mother   . ALS Father     Review of Systems: Constitutional: Denies fevers, chills or abnormal weight loss Eyes: Denies blurriness of vision Ears, nose, mouth, throat, and face: Denies mucositis or sore throat Respiratory: Denies cough, dyspnea or wheezes Cardiovascular: Denies palpitation, chest discomfort or lower extremity swelling Gastrointestinal:  Denies nausea, constipation, diarrhea GU: Denies dysuria or incontinence Skin: Denies abnormal skin rashes Neurological: Per HPI Musculoskeletal: Denies joint pain, back or neck discomfort. No decrease in ROM Behavioral/Psych:  Denies anxiety, disturbance in thought content, and mood instability  Physical Exam: Vitals:   07/15/20 1058  BP: (!) 133/53  Pulse: 73  Resp: 18  Temp: (!) 96.7 F (35.9 C)  SpO2: 100%   KPS: 60. General: Alert, cooperative, pleasant, in no acute distress Head: Craniotomy scar noted, dry and intact. EENT: No conjunctival injection or scleral icterus. Oral mucosa moist Lungs: Resp effort normal Cardiac: Regular rate and rhythm Abdomen: Soft, non-distended abdomen Skin: No rashes cyanosis or petechiae. Extremities: no edema  Neurologic Exam: Mental Status: Awake, alert, attentive to examiner. Oriented to self and environment. Language is fairly densely impaired with regards to fluency, comprehension and repetition.   Cranial Nerves: Visual acuity is grossly normal. Visual fields are full. Extra-ocular movements intact. No ptosis. L UMN facial paresis, tongue midline. Motor: Tone and bulk are normal. Power is 3/5 in left arm and 2/5 in leg, with absent fine motor function in left hand.  Left thenar atrophy noted. Reflexes are symmetric, no pathologic reflexes present. Intact finger to nose bilaterally Sensory: Intact to light touch and temperature Gait:  Deferred  Labs: I have reviewed the data as listed    Component Value Date/Time   NA 139 06/17/2020 1146   K 4.2 06/17/2020 1146   CL 107 06/17/2020 1146   CO2 24 06/17/2020 1146   GLUCOSE 121 (H) 06/17/2020 1146   BUN 21 06/17/2020 1146   CREATININE 1.01 06/17/2020 1146   CALCIUM 8.8 (L) 06/17/2020 1146   PROT 6.2 (L) 06/17/2020 1146   ALBUMIN 3.6 06/17/2020 1146   AST 14 (L) 06/17/2020 1146   ALT 26 06/17/2020 1146   ALKPHOS 53 06/17/2020 1146   BILITOT <0.2 (L) 06/17/2020 1146   GFRNONAA >60 06/17/2020 1146   GFRAA >60 03/06/2019 1026   Lab Results  Component Value Date   WBC 5.9 06/17/2020   NEUTROABS 4.5 06/17/2020   HGB 11.9 (L) 06/17/2020   HCT 34.7 (L) 06/17/2020   MCV 94.0 06/17/2020   PLT 164 06/17/2020   Imaging:  [facility] Clinician Interpretation: I have personally reviewed the CNS images as listed.  My interpretation, in the context of the patient's clinical presentation, is stable disease  MR BRAIN W WO CONTRAST  Result Date: 07/12/2020 CLINICAL DATA:  Glioblastoma.  Assess treatment response EXAM: MRI HEAD WITHOUT AND WITH CONTRAST TECHNIQUE: Multiplanar, multiecho pulse sequences of the brain and surrounding structures were obtained without and with intravenous contrast. CONTRAST:  47m MULTIHANCE GADOBENATE DIMEGLUMINE 529 MG/ML IV SOLN COMPARISON:  MRI head 05/11/2020, 02/02/2020 FINDINGS: Brain: Right frontal operculum resection site is stable with T2 hyperintensity and chronic blood products. Irregular enhancement of the periphery of the surgical cavity is stable. Small area of ependymal enhancement in the right periatrial region is unchanged. Cortically based enhancement in the right parietal lobe best seen on the prior coronal images has improved. There remains small areas of enhancement in this area. No restricted diffusion or acute infarct. Extensive white matter hyperintensity in the right frontal parietal and temporal lobes unchanged from the prior study.  Generalized atrophy. Ventricular enlargement right greater than left is stable. No acute infarct. Vascular: Normal arterial flow voids. Skull and upper cervical spine: Right sided craniotomy. Sinuses/Orbits: Complete opacification left maxillary sinus unchanged. Negative orbit Other: None IMPRESSION: Right operculum resection cavity with peripheral enhancement is stable. Small area of enhancement along the right parietal ependyma is stable Improving enhancement in the right parietal cortex likely due to treatment related enhancement. The prior studies did not  show restricted diffusion in this area therefore enhancing infarct is not felt likely. Electronically Signed   By: Franchot Gallo M.D.   On: 07/12/2020 17:00   Assessment/Plan 1.  Glioblastoma, IDH-wildtype [facility])   Bryan [name] is clinically stable today.  Brain MRI demonstrates improvement of focus of enhancement and mass effect with right parietal lobule consistent with likely progressive disease.  He has not experienced issues with Temozolomide.   We recommended continuing treatment with cycle #9 Temozolomide at 117m/m2, on for five days and off for twenty three days in twenty eight day cycles. The patient will have a complete blood count performed on days 21 and 28 of each cycle, and a comprehensive metabolic panel performed on day 28 of each cycle. Labs may need to be performed more often. Zofran will prescribed for home use for nausea/vomiting.   Chemotherapy should be held for the following:  ANC less than 1,000  Platelets less than 100,000  LFT or creatinine greater than 2x ULN  If clinical concerns/contraindications develop  We ask that EBerton Bonreturn to clinic in 1 months with labs for evaluation, prior to cycle #10.  We will repeat brain MRI in 2 months, and consider transition back to observation if continue to demonstrate stable findings.  Decadron may continue at 153mdaily.  He should con't Keppra 100022mID and con't  Zonisamide 100m17mD for seizure prevention.  We appreciate the opportunity to participate in the care of Bryan [name] will follow up with him soon after family discussion.  I have spent a total of 40 minutes of face-to-face and non-face-to-face time, excluding clinical staff time, preparing to see patient, ordering tests and/or medications, counseling the patient, and independently interpreting results and communicating results to the patient/family/caregiver    ZachVentura Sellers Medical Director of Neuro-Oncology [facility]25/22 10:48 AM

## 2020-07-17 ENCOUNTER — Other Ambulatory Visit (HOSPITAL_COMMUNITY): Payer: Self-pay

## 2020-07-18 ENCOUNTER — Other Ambulatory Visit (HOSPITAL_COMMUNITY): Payer: Self-pay

## 2020-08-02 DIAGNOSIS — E559 Vitamin D deficiency, unspecified: Secondary | ICD-10-CM | POA: Diagnosis not present

## 2020-08-02 DIAGNOSIS — I10 Essential (primary) hypertension: Secondary | ICD-10-CM | POA: Diagnosis not present

## 2020-08-02 DIAGNOSIS — E538 Deficiency of other specified B group vitamins: Secondary | ICD-10-CM | POA: Diagnosis not present

## 2020-08-02 DIAGNOSIS — E119 Type 2 diabetes mellitus without complications: Secondary | ICD-10-CM | POA: Diagnosis not present

## 2020-08-02 DIAGNOSIS — N1831 Chronic kidney disease, stage 3a: Secondary | ICD-10-CM | POA: Diagnosis not present

## 2020-08-02 DIAGNOSIS — E785 Hyperlipidemia, unspecified: Secondary | ICD-10-CM | POA: Diagnosis not present

## 2020-08-02 DIAGNOSIS — E039 Hypothyroidism, unspecified: Secondary | ICD-10-CM | POA: Diagnosis not present

## 2020-08-02 DIAGNOSIS — G8190 Hemiplegia, unspecified affecting unspecified side: Secondary | ICD-10-CM | POA: Diagnosis not present

## 2020-08-02 DIAGNOSIS — I700 Atherosclerosis of aorta: Secondary | ICD-10-CM | POA: Diagnosis not present

## 2020-08-02 DIAGNOSIS — C719 Malignant neoplasm of brain, unspecified: Secondary | ICD-10-CM | POA: Diagnosis not present

## 2020-08-02 DIAGNOSIS — I2510 Atherosclerotic heart disease of native coronary artery without angina pectoris: Secondary | ICD-10-CM | POA: Diagnosis not present

## 2020-08-02 DIAGNOSIS — D6481 Anemia due to antineoplastic chemotherapy: Secondary | ICD-10-CM | POA: Diagnosis not present

## 2020-08-10 ENCOUNTER — Other Ambulatory Visit (HOSPITAL_COMMUNITY): Payer: Self-pay

## 2020-08-12 ENCOUNTER — Other Ambulatory Visit (HOSPITAL_COMMUNITY): Payer: Self-pay

## 2020-08-14 ENCOUNTER — Other Ambulatory Visit (HOSPITAL_COMMUNITY): Payer: Self-pay

## 2020-08-20 ENCOUNTER — Other Ambulatory Visit: Payer: Self-pay

## 2020-08-20 ENCOUNTER — Inpatient Hospital Stay: Payer: Medicare Other | Attending: Internal Medicine

## 2020-08-20 ENCOUNTER — Inpatient Hospital Stay (HOSPITAL_BASED_OUTPATIENT_CLINIC_OR_DEPARTMENT_OTHER): Payer: Medicare Other | Admitting: Internal Medicine

## 2020-08-20 ENCOUNTER — Other Ambulatory Visit (HOSPITAL_COMMUNITY): Payer: Self-pay

## 2020-08-20 VITALS — HR 65 | Temp 96.9°F | Resp 18 | Ht 64.0 in | Wt 151.1 lb

## 2020-08-20 DIAGNOSIS — K219 Gastro-esophageal reflux disease without esophagitis: Secondary | ICD-10-CM | POA: Insufficient documentation

## 2020-08-20 DIAGNOSIS — E7800 Pure hypercholesterolemia, unspecified: Secondary | ICD-10-CM | POA: Insufficient documentation

## 2020-08-20 DIAGNOSIS — I10 Essential (primary) hypertension: Secondary | ICD-10-CM | POA: Insufficient documentation

## 2020-08-20 DIAGNOSIS — E039 Hypothyroidism, unspecified: Secondary | ICD-10-CM | POA: Diagnosis not present

## 2020-08-20 DIAGNOSIS — C711 Malignant neoplasm of frontal lobe: Secondary | ICD-10-CM

## 2020-08-20 DIAGNOSIS — Z9221 Personal history of antineoplastic chemotherapy: Secondary | ICD-10-CM | POA: Diagnosis not present

## 2020-08-20 DIAGNOSIS — Z7952 Long term (current) use of systemic steroids: Secondary | ICD-10-CM | POA: Insufficient documentation

## 2020-08-20 DIAGNOSIS — Z79899 Other long term (current) drug therapy: Secondary | ICD-10-CM | POA: Insufficient documentation

## 2020-08-20 DIAGNOSIS — E119 Type 2 diabetes mellitus without complications: Secondary | ICD-10-CM | POA: Insufficient documentation

## 2020-08-20 DIAGNOSIS — Z87891 Personal history of nicotine dependence: Secondary | ICD-10-CM | POA: Insufficient documentation

## 2020-08-20 DIAGNOSIS — R569 Unspecified convulsions: Secondary | ICD-10-CM | POA: Diagnosis not present

## 2020-08-20 DIAGNOSIS — Z923 Personal history of irradiation: Secondary | ICD-10-CM | POA: Diagnosis not present

## 2020-08-20 LAB — CBC WITH DIFFERENTIAL (CANCER CENTER ONLY)
Abs Immature Granulocytes: 0.08 10*3/uL — ABNORMAL HIGH (ref 0.00–0.07)
Basophils Absolute: 0.1 10*3/uL (ref 0.0–0.1)
Basophils Relative: 1 %
Eosinophils Absolute: 0.1 10*3/uL (ref 0.0–0.5)
Eosinophils Relative: 2 %
HCT: 34.3 % — ABNORMAL LOW (ref 39.0–52.0)
Hemoglobin: 11.8 g/dL — ABNORMAL LOW (ref 13.0–17.0)
Immature Granulocytes: 2 %
Lymphocytes Relative: 22 %
Lymphs Abs: 1.2 10*3/uL (ref 0.7–4.0)
MCH: 33.3 pg (ref 26.0–34.0)
MCHC: 34.4 g/dL (ref 30.0–36.0)
MCV: 96.9 fL (ref 80.0–100.0)
Monocytes Absolute: 0.6 10*3/uL (ref 0.1–1.0)
Monocytes Relative: 12 %
Neutro Abs: 3.3 10*3/uL (ref 1.7–7.7)
Neutrophils Relative %: 61 %
Platelet Count: 172 10*3/uL (ref 150–400)
RBC: 3.54 MIL/uL — ABNORMAL LOW (ref 4.22–5.81)
RDW: 13.1 % (ref 11.5–15.5)
WBC Count: 5.3 10*3/uL (ref 4.0–10.5)
nRBC: 0 % (ref 0.0–0.2)

## 2020-08-20 LAB — CMP (CANCER CENTER ONLY)
ALT: 15 U/L (ref 0–44)
AST: 10 U/L — ABNORMAL LOW (ref 15–41)
Albumin: 3.4 g/dL — ABNORMAL LOW (ref 3.5–5.0)
Alkaline Phosphatase: 48 U/L (ref 38–126)
Anion gap: 7 (ref 5–15)
BUN: 22 mg/dL (ref 8–23)
CO2: 27 mmol/L (ref 22–32)
Calcium: 9.1 mg/dL (ref 8.9–10.3)
Chloride: 106 mmol/L (ref 98–111)
Creatinine: 1.1 mg/dL (ref 0.61–1.24)
GFR, Estimated: >60 mL/min (ref >60)
Glucose, Bld: 164 mg/dL — ABNORMAL HIGH (ref 70–99)
Potassium: 4.3 mmol/L (ref 3.5–5.1)
Sodium: 140 mmol/L (ref 135–145)
Total Bilirubin: 0.3 mg/dL (ref 0.3–1.2)
Total Protein: 6 g/dL — ABNORMAL LOW (ref 6.5–8.1)

## 2020-08-20 MED ORDER — TEMOZOLOMIDE 140 MG PO CAPS
140.0000 mg | ORAL_CAPSULE | Freq: Every day | ORAL | 0 refills | Status: DC
  Filled 2020-08-20 (×2): qty 5, 5d supply, fill #0

## 2020-08-20 MED ORDER — TEMOZOLOMIDE 100 MG PO CAPS
100.0000 mg | ORAL_CAPSULE | Freq: Every day | ORAL | 0 refills | Status: DC
  Filled 2020-08-20 (×2): qty 5, 5d supply, fill #0

## 2020-08-20 MED ORDER — DENOSUMAB 120 MG/1.7ML ~~LOC~~ SOLN
SUBCUTANEOUS | Status: AC
  Filled 2020-08-20: qty 1.7

## 2020-08-20 NOTE — Progress Notes (Signed)
[facility] [address] [place], [place] [address] [phone]   Interval Evaluation  Date of Service: 08/20/20 Patient Name: Noach Calvillo Patient MRN: [id number] Patient DOB: 04/22/1940 Provider: Ventura Sellers, MD  Identifying Statement:  Wassim Kirksey is a 80 y.o. male with right frontal glioblastoma   Oncologic History: Oncology History   Glioblastoma, IDH-wildtype ([place])   03/07/2018 Surgery   Craniotomy, resection by Dr. Tommi Rumps ([facility]).  Path is Glioblastoma   03/29/2018 - 03/29/2018 Chemotherapy   The patient had temozolomide (TEMODAR) 140 MG capsule, 140 mg (100 % of original dose 140 mg), Oral, Daily, 1 of 1 cycle, Start date: 03/29/2018, End date: 05/10/2018 Dose modification: 140 mg (original dose 140 mg, Cycle 1)  for chemotherapy treatment.    04/04/2018 - 05/13/2018 Radiation Therapy   IMRT and concurrent Temozolomide with Dr. Tammi Klippel   06/09/2018 - 01/02/2019 Chemotherapy   The patient completed 6 cycles of adjuvant 5-day Temozolomide     05/12/2020 Progression   Progression of disease lateral and inferior to resection cavity   05/12/2020 -  Chemotherapy   Resumes 5-day Temozolomide         Biomarkers:  MGMT Unknown.  IDH 1/2 Wild type.  EGFR Expressed  TERT Unknown   Interval History: Joseantonio Dittmar presents today for follow up after MRI brain.  He has now completed cycle #9 of 5-day TMZ, he again tolerated chemo well, no significant issues.  Left sided weakness remains static. Speaking/fluency is otherwise stable from prior.  He is not independent with any ADLs at this time, sleep volume and fatigue burden are quite high.  History is overall still limited by language impairments, wife assisted again with history.    H+P (02/16/18) [name] 80 y.o. male presented after family noted drooping of the left side of his face yesterday.  In addition, he/they describe some cognitive changes, such as difficulty  "finding words" in certain situations.  The patient also feels like his left leg is "lagging" a little behind his right lately.  He is walking on his own, however, and with no falls.  Denies headaches, seizures.   Medications: Current Outpatient Medications on File Prior to Visit  Medication Sig Dispense Refill  . acetaminophen (TYLENOL) 325 MG tablet Take 2 tablets (650 mg total) by mouth every 6 (six) hours as needed for mild pain.    . cetirizine-pseudoephedrine (ZYRTEC-D) 5-120 MG tablet Take 1 tablet by mouth 2 (two) times daily.    . Cholecalciferol (D-3-5) 125 MCG (5000 UT) capsule Take 1 capsule by mouth daily.    . Cyanocobalamin (B-12) 1000 MCG LOZG Take 2 lozenges by mouth daily.    [name] dexamethasone (DECADRON) 1 MG tablet TAKE 1 TABLET EVERY DAY 90 tablet 1  . fluticasone (FLONASE) 50 MCG/ACT nasal spray Place 1 spray into both nostrils 2 (two) times daily.    [name] levETIRAcetam (KEPPRA) 100 MG/ML solution TAKE 5 ML (500 MG TOTAL) BY MOUTH TWO  TIMES DAILY. 900 mL 1  . levothyroxine (SYNTHROID, LEVOTHROID) 75 MCG tablet Take 1 tablet (75 mcg total) by mouth daily before breakfast. 30 tablet 0  . ondansetron (ZOFRAN) 8 MG tablet TAKE 1 TABLET BY MOUTH 2 TIMES DAILY FOR NAUSEA/VOMITING. MAY TAKE 30-60 MINUTES PRIOR TO TEMODAR IF NAUSEA/VOMITING OCCURS. 30 tablet 1  . temozolomide (TEMODAR) 100 MG capsule Take 1 capsule (100 mg total) by mouth daily. Take for 5 days on, 23 days off, repeat every 28d. May take  on an empty stomach to decrease N/V 5 capsule 0  . temozolomide (TEMODAR) 140 MG capsule Take 1 capsule (140 mg total) by mouth daily. Take for 5 days on, 23 days off. Repeat every 28 days. May take on an empty stomach to decrease N/V 5 capsule 0  . zonisamide (ZONEGRAN) 100 MG capsule TAKE 1 CAPSULE EVERY DAY 90 capsule 3   No current facility-administered medications on file prior to visit.    Allergies:  Allergies  Allergen Reactions  . Other     Cats, pollen   Past Medical  History:  Past Medical History:  Diagnosis Date  . Arthritis   . Brain cancer ([place])    Glioblastoma  . Diabetes mellitus without complication ([place])   . GERD (gastroesophageal reflux disease)   . Hypercholesterolemia   . Hypertension   . Thyroid disease    Past Surgical History:  Past Surgical History:  Procedure Laterality Date  . CRANIOTOMY    . HERNIA REPAIR    . 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 Marital status: Married    Spouse name: Not on file  . Number of children: Not on file  . Years of education: Not on file  . Highest education level: Not on file  Occupational History  . Not on file  Tobacco Use  . Smoking status: Former Smoker    Packs/day: 0.25    Years: 3.00    Pack years: 0.75    Types: Cigarettes    Quit date: 03/23/1961    Years since quitting: 59.4  . Smokeless tobacco: Never Used  Vaping Use  . Vaping Use: Never used  Substance and Sexual Activity  . Alcohol use: Yes    Alcohol/week: 1.0 standard drink    Types: 1 Glasses of wine per week    Comment: weekly  . Drug use: Never  . Sexual activity: Not Currently  Other Topics Concern  . Not on file  Social History Narrative   Married. Resides in [place].   Social Determinants of Health   Financial Resource Strain: Not on file  Food Insecurity: Not on file  Transportation Needs: Not on file  Physical Activity: Not on file  Stress: Not on file  Social Connections: Not on file  Intimate Partner Violence: Not on file   Family History:  Family History  Problem Relation Age of Onset  . Diabetes Mellitus II Mother   . ALS Father     Review of Systems: Constitutional: Denies fevers, chills or abnormal weight loss Eyes: Denies blurriness of vision Ears, nose, mouth, throat, and face: Denies mucositis or sore throat Respiratory: Denies cough, dyspnea or wheezes Cardiovascular: Denies  palpitation, chest discomfort or lower extremity swelling Gastrointestinal:  Denies nausea, constipation, diarrhea GU: Denies dysuria or incontinence Skin: Denies abnormal skin rashes Neurological: Per HPI Musculoskeletal: Denies joint pain, back or neck discomfort. No decrease in ROM Behavioral/Psych: Denies anxiety, disturbance in thought content, and mood instability  Physical Exam: Vitals:   08/20/20 1242  Pulse: 65  Resp: 18  Temp: (!) 96.9 F (36.1 C)  SpO2: 99%   KPS: 60. General: Alert, cooperative, pleasant, in no acute distress Head: Craniotomy scar noted, dry and intact. EENT: No conjunctival injection or scleral icterus. Oral mucosa moist Lungs: Resp effort normal Cardiac: Regular rate and rhythm Abdomen: Soft, non-distended abdomen Skin: No rashes cyanosis or petechiae. Extremities: no edema  Neurologic Exam: Mental Status: Awake, alert, attentive to examiner. Oriented to self and environment. Language is fairly densely impaired with regards to fluency, comprehension and repetition.   Cranial Nerves: Visual acuity is grossly normal. Visual fields are full. Extra-ocular movements intact. No ptosis. L UMN facial paresis, tongue midline. Motor: Tone and bulk are normal. Power is 3/5 in left arm and 2/5 in leg, with absent fine motor function in left hand.  Left thenar atrophy noted. Reflexes are symmetric, no pathologic reflexes present. Intact finger to nose bilaterally Sensory: Intact to light touch and temperature Gait: Deferred  Labs: I have reviewed the data as listed    Component Value Date/Time   NA 140 07/15/2020 1037   K 3.9 07/15/2020 1037   CL 107 07/15/2020 1037   CO2 26 07/15/2020 1037   GLUCOSE 150 (H) 07/15/2020 1037   BUN 22 07/15/2020 1037   CREATININE 1.15 07/15/2020 1037   CALCIUM 9.0 07/15/2020 1037   PROT 6.1 (L) 07/15/2020 1037   ALBUMIN 3.5 07/15/2020 1037   AST 15 07/15/2020 1037   ALT 19 07/15/2020 1037   ALKPHOS 49 07/15/2020 1037    BILITOT 0.4 07/15/2020 1037   GFRNONAA >60 07/15/2020 1037   GFRAA >60 03/06/2019 1026   Lab Results  Component Value Date   WBC 5.3 08/20/2020   NEUTROABS 3.3 08/20/2020   HGB 11.8 (L) 08/20/2020   HCT 34.3 (L) 08/20/2020   MCV 96.9 08/20/2020   PLT 172 08/20/2020    Assessment/Plan 1.  Glioblastoma, IDH-wildtype [facility])   Mr. Wachter is clinically stable today. No new or progressive changes.   We recommended continuing treatment with cycle #10 Temozolomide at 139m/m2, on for five days and off for twenty three days in twenty eight day cycles. The patient will have a complete blood count performed on days 21 and 28 of each cycle, and a comprehensive metabolic panel performed on day 28 of each cycle. Labs may need to be performed more often. Zofran will prescribed for home use for nausea/vomiting.   Chemotherapy should be held for the following:  ANC less than 1,000  Platelets less than 100,000  LFT or creatinine greater than 2x ULN  If clinical concerns/contraindications develop  We ask that EBerton Bonreturn to clinic in 1 months with MRI brain for evaluation, prior to cycle #11.    Decadron may continue at 162mdaily.  He should con't Keppra 100077mID and con't Zonisamide 100m77mD for seizure prevention.  We appreciate the opportunity to participate in the care of EdwaBerton Bone will follow up with him soon after family discussion.  I have spent a total of 30 minutes of face-to-face and non-face-to-face time, excluding clinical staff time, preparing to see patient, ordering tests and/or medications, counseling the patient, and independently interpreting results and communicating results to the patient/family/caregiver    ZachVentura Sellers Medical Director of Neuro-Oncology [facility]31/22 12:53 PM

## 2020-08-21 ENCOUNTER — Other Ambulatory Visit (HOSPITAL_COMMUNITY): Payer: Self-pay

## 2020-08-22 ENCOUNTER — Other Ambulatory Visit (HOSPITAL_COMMUNITY): Payer: Self-pay

## 2020-08-26 ENCOUNTER — Other Ambulatory Visit (HOSPITAL_COMMUNITY): Payer: Self-pay

## 2020-08-29 ENCOUNTER — Other Ambulatory Visit: Payer: Self-pay | Admitting: Radiation Therapy

## 2020-08-30 ENCOUNTER — Telehealth: Payer: Self-pay | Admitting: Internal Medicine

## 2020-08-30 NOTE — Telephone Encounter (Signed)
Scheduled per 05/01 los, patient has been called and notified of upcoming appointments. Spoke with patient's wife.

## 2020-09-02 ENCOUNTER — Other Ambulatory Visit (HOSPITAL_COMMUNITY): Payer: Self-pay

## 2020-09-02 IMAGING — MR MR HEAD WO/W CM
12 of 14 series · 38 of 48 positions shown · IV contrast (gadavist)
Comparison: Head CT 02/15/2018

CLINICAL DATA: Brain mass.  Facial paralysis.

EXAM:
MRI HEAD WITHOUT AND WITH CONTRAST
TECHNIQUE: Multiplanar, multiecho pulse sequences of the brain and surrounding
structures were obtained without and with intravenous contrast.
CONTRAST:  7 mL Gadavist

[Series 5: DWI · axial · 4.0mm · 0.88mm/px · z∈[-45,+92]mm · 6 of 72 slices shown (1 of 4)]
[im 1/72]
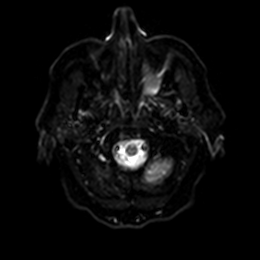
[im 15/72]
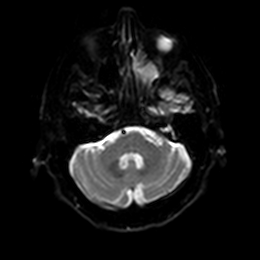
[im 29/72]
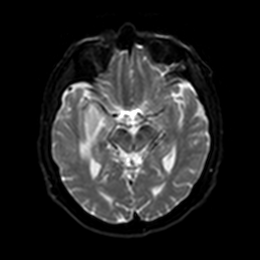
[im 43/72]
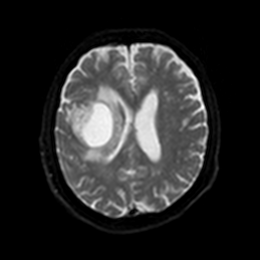
[im 57/72]
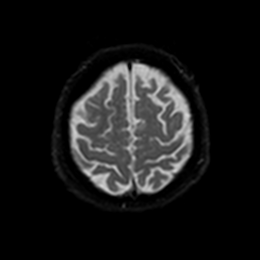
[im 72/72]
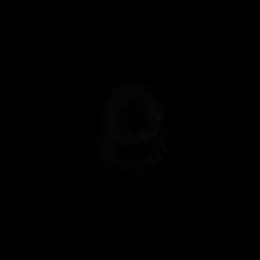

[Series 6: DWI · axial · 4.0mm · 0.88mm/px · z∈[-45,+92]mm · 3 of 36 slices shown (2 of 4)]
[im 1/36]
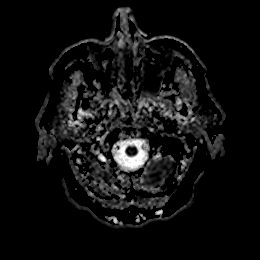
[im 18/36]
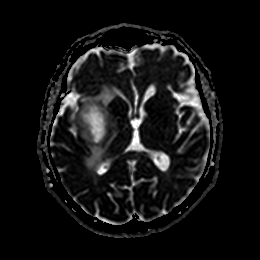
[im 36/36]
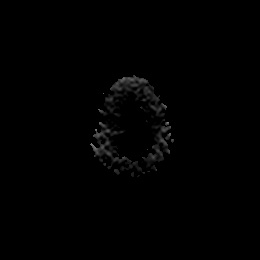

[Series 7: DWI · coronal · 4.0mm · 0.88mm/px · 5 of 68 slices shown (3 of 4)]
[im 1/68]
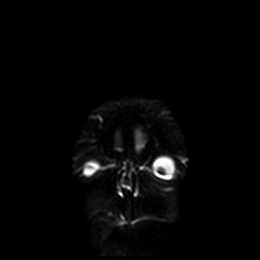
[im 17/68]
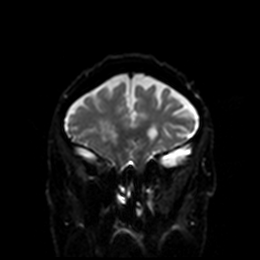
[im 34/68]
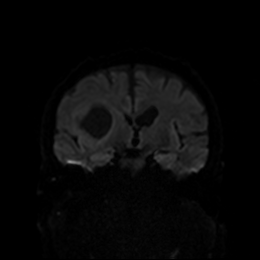
[im 51/68]
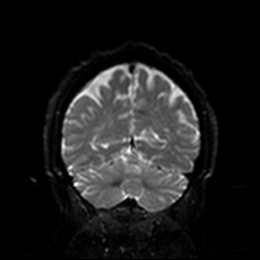
[im 68/68]
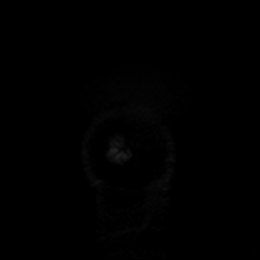

[Series 8: DWI · coronal · 4.0mm · 0.88mm/px · 3 of 34 slices shown (4 of 4)]
[im 1/34]
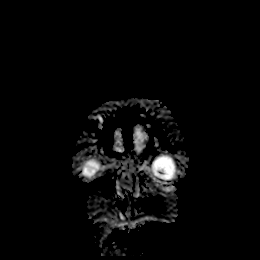
[im 17/34]
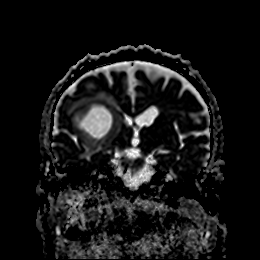
[im 34/34]
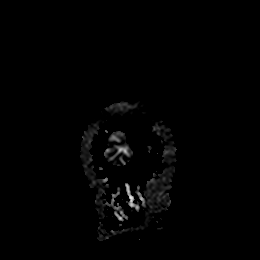

[Series 9: T1 · sagittal · 5.0mm · 0.75mm/px · 2 of 23 slices shown (1 of 2)]
[im 1/23]
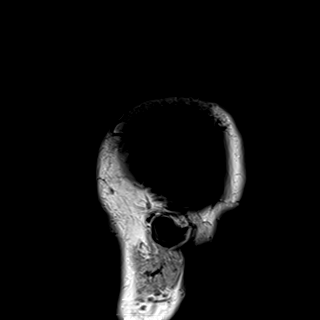
[im 23/23]
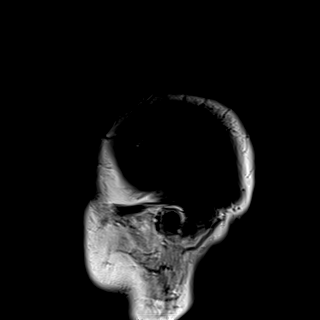

[Series 10: T2 · axial · 5.0mm · 0.72mm/px · z∈[-48,+92]mm · 2 of 25 slices shown]
[im 1/25]
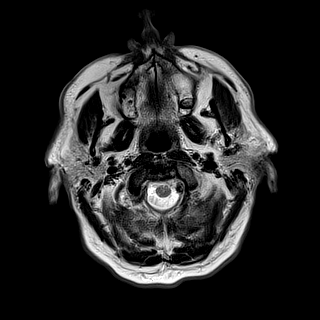
[im 25/25]
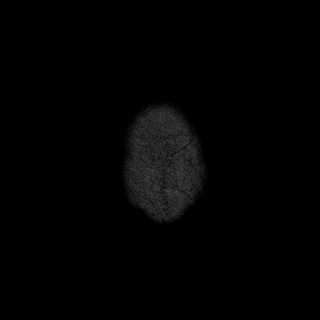

[Series 11: FLAIR · axial · 5.0mm · 0.45mm/px · z∈[-49,+91]mm · 2 of 25 slices shown]
[im 1/25]
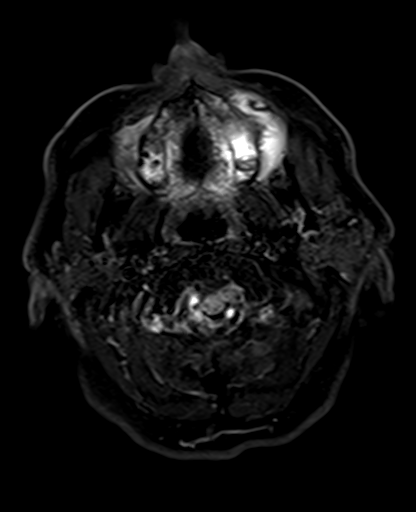
[im 25/25]
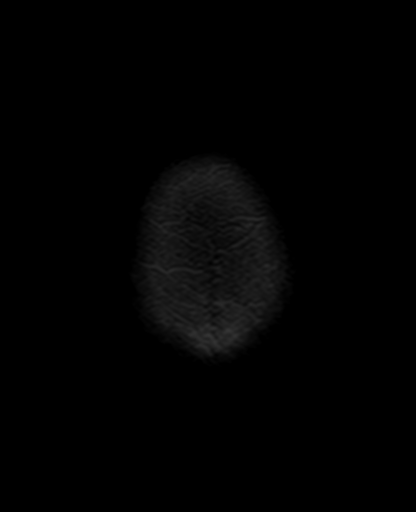

[Series 12: swi_images · axial · 3.0mm · 0.90mm/px · z∈[-61,+111]mm · 5 of 60 slices shown]
[im 1/60]
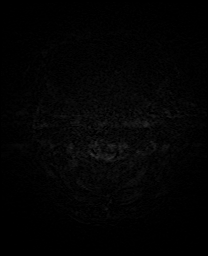
[im 15/60]
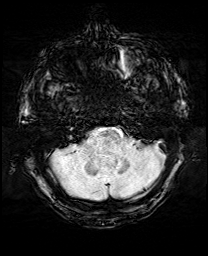
[im 30/60]
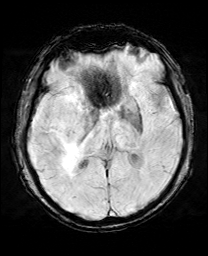
[im 45/60]
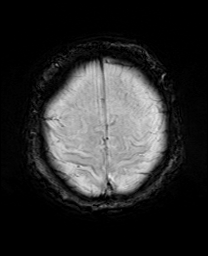
[im 60/60]
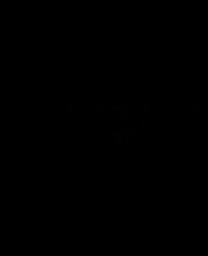

[Series 13: mip_images(sw) · axial · 24.0mm · 0.90mm/px · z∈[-51,+101]mm · 4 of 53 slices shown]
[im 1/53]
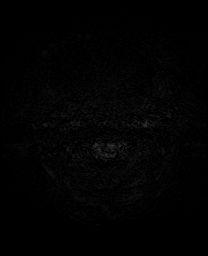
[im 18/53]
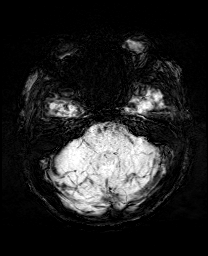
[im 35/53]
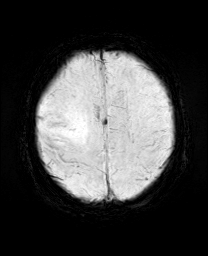
[im 53/53]
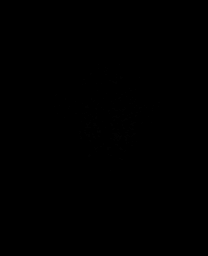

[Series 15: T2 post-contrast · coronal · 5.0mm · 0.72mm/px · 2 of 28 slices shown]
[im 1/28]
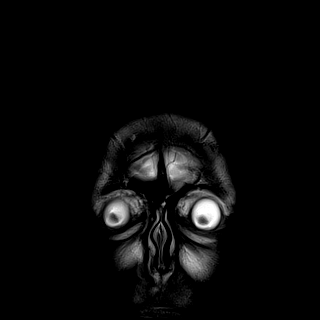
[im 28/28]
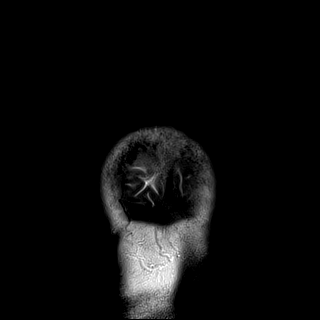

[Series 17: T1 post-contrast · coronal · 5.0mm · 0.34mm/px · 2 of 28 slices shown]
[im 1/28]
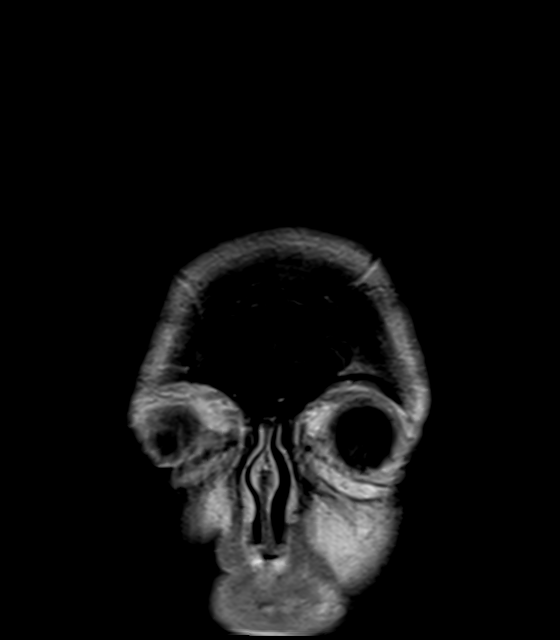
[im 28/28]
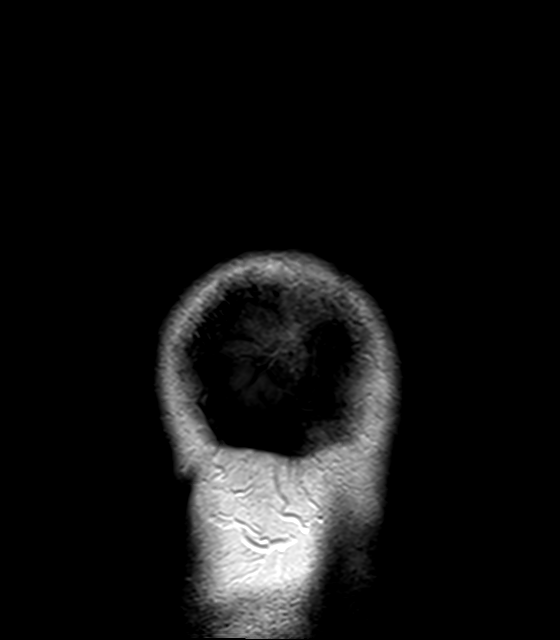

[Series 18: T1 · sagittal · 5.0mm · 0.75mm/px · 2 of 26 slices shown (2 of 2)]
[im 1/26]
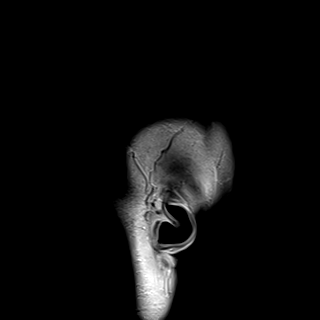
[im 26/26]
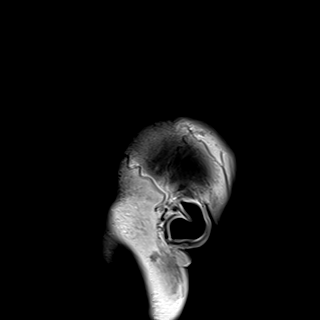

[38 of 48 positions shown; findings below may reference images not displayed]

FINDINGS: BRAIN: There is a mass centered in the right frontal white matter
with a large cystic/necrotic component. There is a solid component
at the lateral aspect, measuring 2.5 x 1.4 x 2.7 cm. The solid
component at the inferior aspect measures 3.7 x 2.3 x 1.1 cm.
Including the cystic component, the complex measures 4.0 x 4.6 x
cm. There is no diffusion restriction within the mass. There is
hyperintense T2-weighted signal that extends to the right insula,
along the right internal capsule and into the right temporal white
matter. There are scattered foci of white matter hyperintensity
within both hemispheres otherwise. The cerebral and cerebellar
volume are age-appropriate. Susceptibility-sensitive sequences show
no chronic microhemorrhage or superficial siderosis. Leftward
midline shift measures 4 mm.

VASCULAR: Major intracranial arterial and venous sinus flow voids
are normal.

SKULL AND UPPER CERVICAL SPINE: Calvarial bone marrow signal is
normal. There is no skull base mass. Visualized upper cervical spine
and soft tissues are normal.

SINUSES/ORBITS: There is complete opacification of the left
maxillary sinus. The orbits are normal.
IMPRESSION: 1. Large, partially necrotic mass centered in the right frontal
white matter, most consistent with a primary brain neoplasm, most
likely a high grade glioma. Surrounding hyperintense T2-weighted
signal likely indicates a combination of edema and nonenhancing
tumor. There is no diffusion restriction within the necrotic
component to suggest abscess.
2. Leftward midline shift measures 4 mm.

## 2020-09-02 IMAGING — CT CT ABD-PELV W/ CM
2 of 5 series · 13 of 36 positions shown, 16 images · IV contrast (APPLIED)
Comparison: None.

CLINICAL DATA: Shortness of breath. Left facial droop. Brain mass.
Assessment for malignancy within the chest, abdomen or pelvis.

EXAM:
CT CHEST, ABDOMEN, AND PELVIS WITH CONTRAST
TECHNIQUE: Multidetector CT imaging of the chest, abdomen and pelvis was
performed following the standard protocol during bolus
administration of intravenous contrast.
CONTRAST:  100mL OMNIPAQUE IOHEXOL 300 MG/ML  SOLN

[Series 3: cap 5.0 i31f 2 · axial · 0.96mm/px · z∈[+814,+1319]mm · 10 of 125 slices shown, 13 images]
[im 12/125  mediastinal]
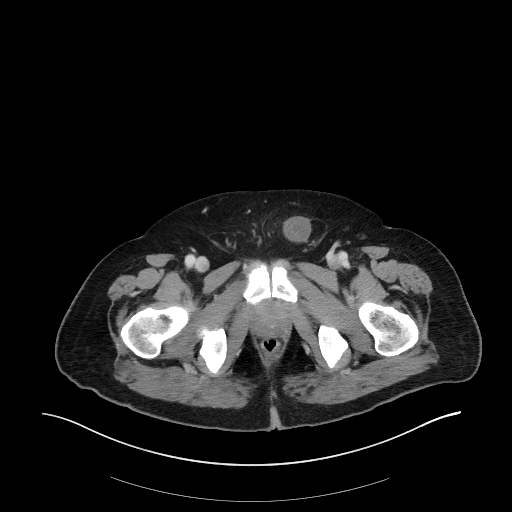
[im 12/125  lung]
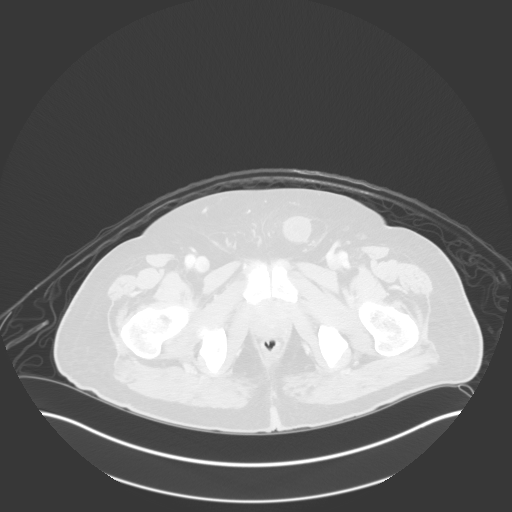
[im 23/125  lung]
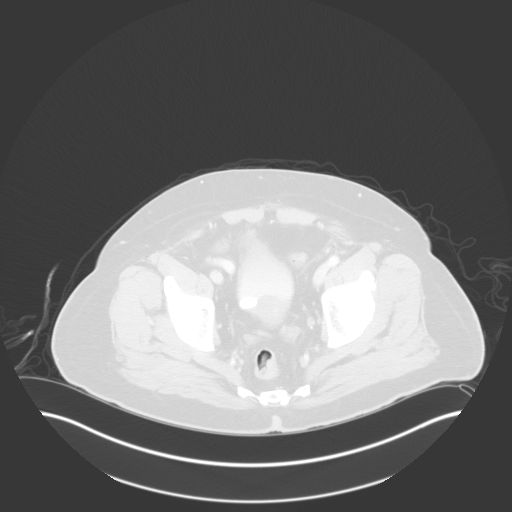
[im 34/125  lung]
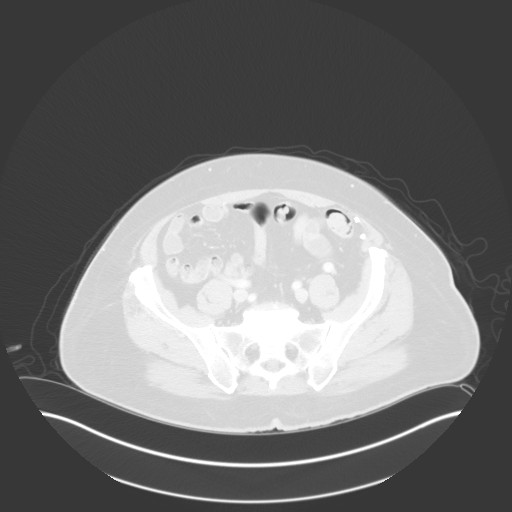
[im 46/125  lung]
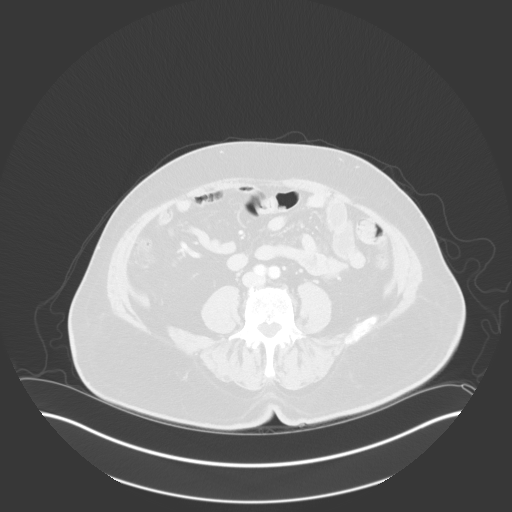
[im 57/125  mediastinal]
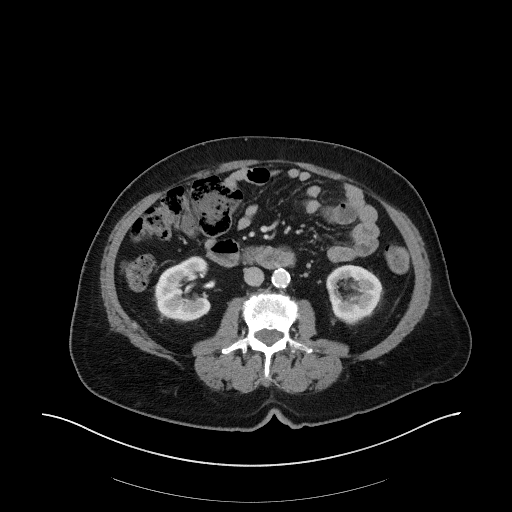
[im 57/125  lung]
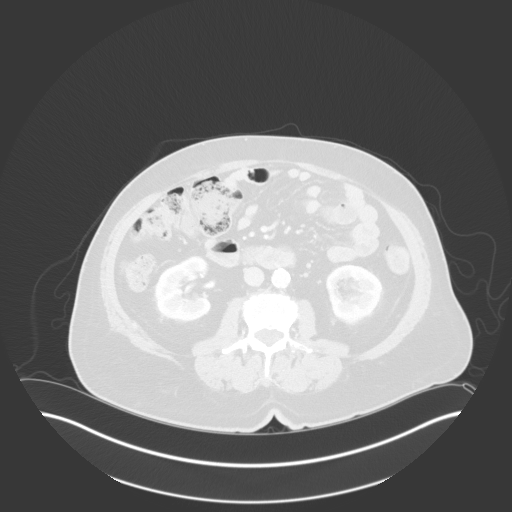
[im 68/125  lung]
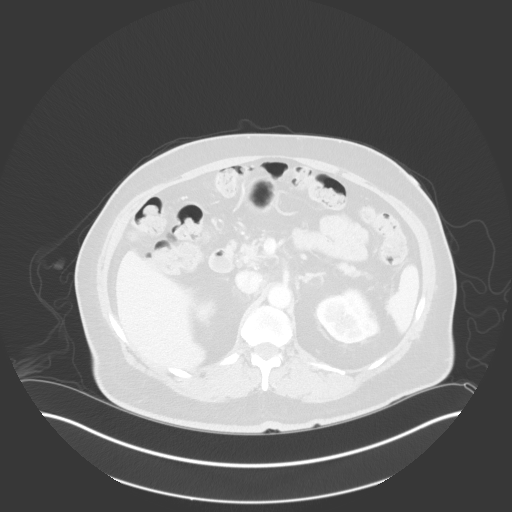
[im 79/125  lung]
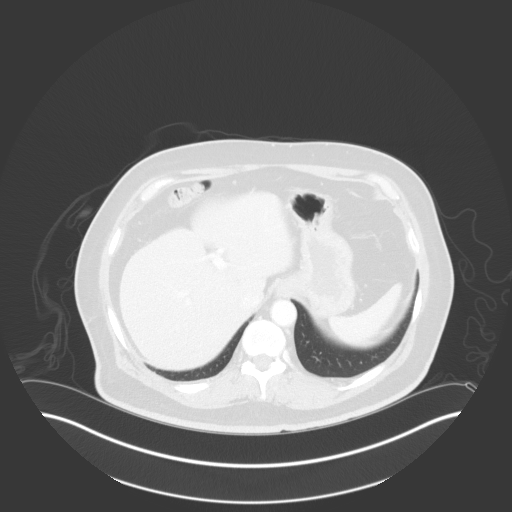
[im 91/125  lung]
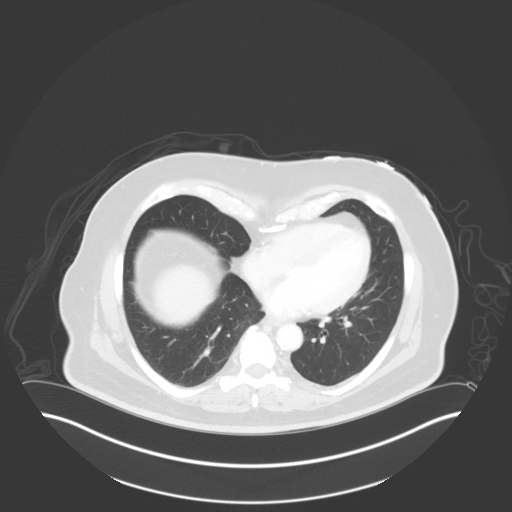
[im 102/125  mediastinal]
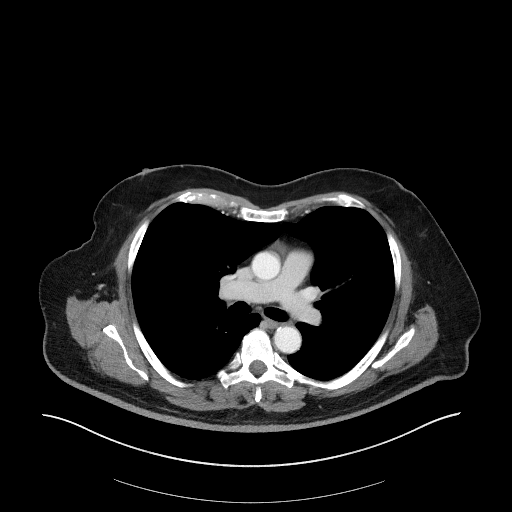
[im 102/125  lung]
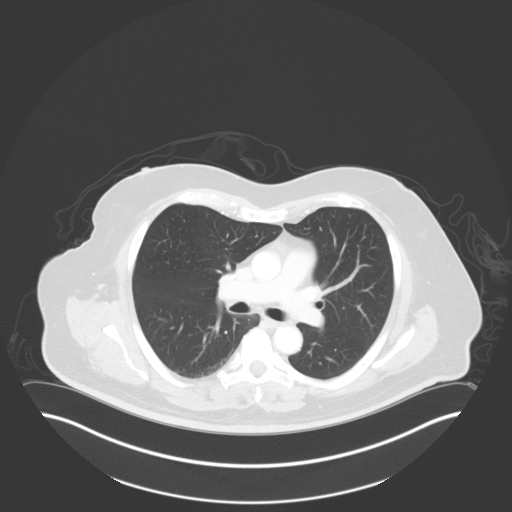
[im 113/125  lung]
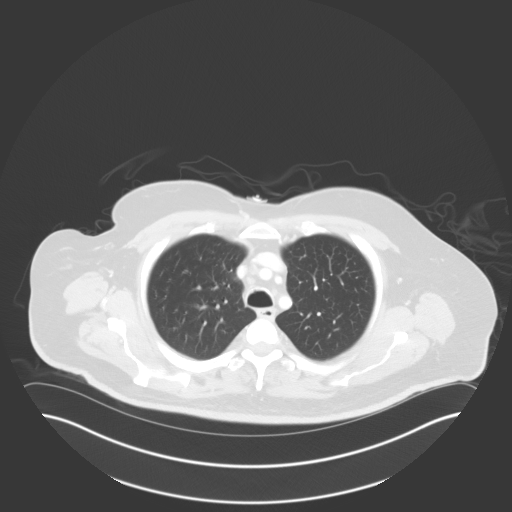

[Series 6: coronal · coronal · 0.73mm/px · 3 of 134 slices shown]
[im 27/134  lung]
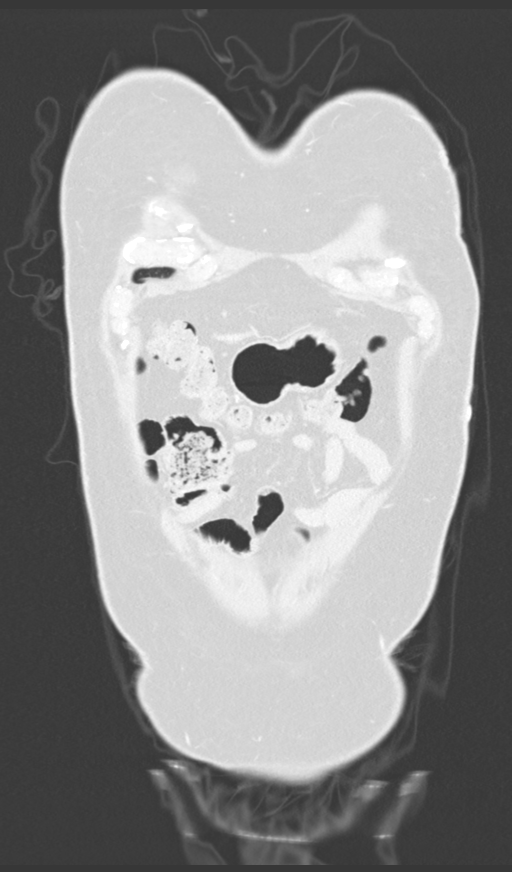
[im 54/134  lung]
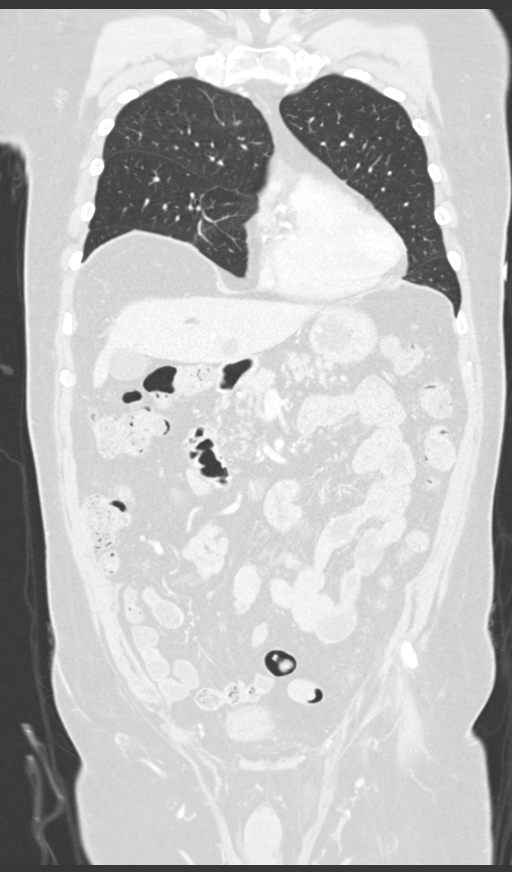
[im 80/134  lung]
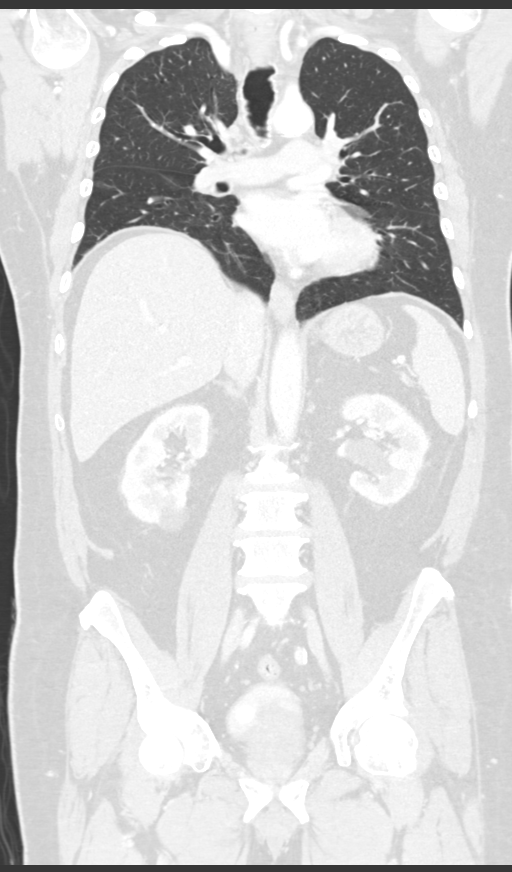

[13 of 36 positions shown; findings below may reference images not displayed]

FINDINGS: CT CHEST FINDINGS

Cardiovascular: There is calcific aortic atherosclerosis. Heart size
is normal.

Mediastinum/Nodes: No mediastinal, hilar or axillary
lymphadenopathy. The visualized thyroid and thoracic esophageal
course are unremarkable.

Lungs/Pleura: There is a 4 mm nodule of the left lung base. The
lungs are otherwise clear.

Musculoskeletal: No chest wall mass or suspicious bone lesions
identified.

CT ABDOMEN PELVIS FINDINGS

HEPATOBILIARY: The hepatic contours and density are normal. There is
no intra- or extrahepatic biliary dilatation. Small cyst in the left
hepatic lobe. The gallbladder is normal.

PANCREAS: The pancreatic parenchymal contours are normal and there
is no ductal dilatation. There is no peripancreatic fluid
collection.

SPLEEN: Normal.

ADRENALS/URINARY TRACT:

--Adrenal glands: Normal.

--Right kidney/ureter: There is a lower pole cyst measuring 4.0 cm.
No hydronephrosis, nephroureterolithiasis, perinephric stranding or
solid renal mass.

--Left kidney/ureter: No hydronephrosis, nephroureterolithiasis,
perinephric stranding or solid renal mass.

--Urinary bladder: There is a stone within the dependent portion of
the urinary bladder measuring 14 x 9 mm.

STOMACH/BOWEL:

--Stomach/Duodenum: There is no hiatal hernia or other gastric
abnormality. The duodenal course and caliber are normal.

--Small bowel: No dilatation or inflammation.

--Colon: No focal abnormality.

--Appendix: Normal.

VASCULAR/LYMPHATIC: Atherosclerotic calcification is present within
the non-aneurysmal abdominal aorta, without hemodynamically
significant stenosis. The portal vein, splenic vein, superior
mesenteric vein and IVC are patent. No abdominal or pelvic
lymphadenopathy.

REPRODUCTIVE: Enlarged, heterogeneous prostate gland measures 6.3 x
5.7 x 7.7 cm. The seminal vesicles are normal.

MUSCULOSKELETAL. No bony spinal canal stenosis or focal osseous
abnormality.

OTHER: There is a small, fluid containing left inguinal hernia.
Sequelae of prior hernia repair in the anterior left abdomen.
IMPRESSION: 1. No acute abnormality of the chest, abdomen or pelvis.
[DATE] x 9 mm stone within the dependent portion of the urinary
bladder.
3. Enlarged, heterogeneous prostate gland. Correlate with PSA.
4. 4 mm left lung base pulmonary nodule. No follow-up needed if
patient is low-risk. Non-contrast chest CT can be considered in 12
months if patient is high-risk. This recommendation follows the
consensus statement: Guidelines for Management of Incidental
Pulmonary Nodules Detected on CT Images: From the [HOSPITAL]

Aortic Atherosclerosis (TR6LH-EA7.7).

## 2020-09-06 ENCOUNTER — Other Ambulatory Visit: Payer: Self-pay | Admitting: Internal Medicine

## 2020-09-09 ENCOUNTER — Other Ambulatory Visit (HOSPITAL_COMMUNITY): Payer: Self-pay

## 2020-09-10 ENCOUNTER — Encounter: Payer: Self-pay | Admitting: Internal Medicine

## 2020-09-10 ENCOUNTER — Other Ambulatory Visit (HOSPITAL_COMMUNITY): Payer: Self-pay

## 2020-09-11 ENCOUNTER — Other Ambulatory Visit: Payer: Medicare Other

## 2020-09-13 ENCOUNTER — Other Ambulatory Visit: Payer: Self-pay

## 2020-09-13 ENCOUNTER — Ambulatory Visit
Admission: RE | Admit: 2020-09-13 | Discharge: 2020-09-13 | Disposition: A | Discharge status: Home / Self Care | Payer: Medicare Other | Source: Ambulatory Visit | Attending: Internal Medicine | Admitting: Internal Medicine

## 2020-09-13 DIAGNOSIS — C719 Malignant neoplasm of brain, unspecified: Secondary | ICD-10-CM | POA: Diagnosis not present

## 2020-09-13 DIAGNOSIS — G319 Degenerative disease of nervous system, unspecified: Secondary | ICD-10-CM | POA: Diagnosis not present

## 2020-09-13 DIAGNOSIS — C711 Malignant neoplasm of frontal lobe: Secondary | ICD-10-CM

## 2020-09-13 MED ORDER — GADOBENATE DIMEGLUMINE 529 MG/ML IV SOLN
14.0000 mL | Freq: Once | INTRAVENOUS | Status: AC | PRN
  Administered 2020-09-13: 14 mL via INTRAVENOUS

## 2020-09-16 ENCOUNTER — Inpatient Hospital Stay (HOSPITAL_BASED_OUTPATIENT_CLINIC_OR_DEPARTMENT_OTHER): Payer: Medicare Other | Admitting: Internal Medicine

## 2020-09-16 ENCOUNTER — Other Ambulatory Visit: Payer: Self-pay

## 2020-09-16 ENCOUNTER — Inpatient Hospital Stay: Payer: Medicare Other | Attending: Internal Medicine

## 2020-09-16 ENCOUNTER — Other Ambulatory Visit (HOSPITAL_COMMUNITY): Payer: Self-pay

## 2020-09-16 ENCOUNTER — Inpatient Hospital Stay: Payer: Medicare Other

## 2020-09-16 VITALS — BP 144/62 | HR 79 | Temp 98.7°F | Resp 17 | Ht 64.0 in | Wt 151.2 lb

## 2020-09-16 DIAGNOSIS — Z7952 Long term (current) use of systemic steroids: Secondary | ICD-10-CM | POA: Insufficient documentation

## 2020-09-16 DIAGNOSIS — C711 Malignant neoplasm of frontal lobe: Secondary | ICD-10-CM | POA: Diagnosis not present

## 2020-09-16 DIAGNOSIS — E119 Type 2 diabetes mellitus without complications: Secondary | ICD-10-CM | POA: Diagnosis not present

## 2020-09-16 DIAGNOSIS — Z85828 Personal history of other malignant neoplasm of skin: Secondary | ICD-10-CM | POA: Insufficient documentation

## 2020-09-16 DIAGNOSIS — R569 Unspecified convulsions: Secondary | ICD-10-CM

## 2020-09-16 DIAGNOSIS — E079 Disorder of thyroid, unspecified: Secondary | ICD-10-CM | POA: Diagnosis not present

## 2020-09-16 DIAGNOSIS — I10 Essential (primary) hypertension: Secondary | ICD-10-CM | POA: Insufficient documentation

## 2020-09-16 DIAGNOSIS — E7800 Pure hypercholesterolemia, unspecified: Secondary | ICD-10-CM | POA: Insufficient documentation

## 2020-09-16 DIAGNOSIS — Z79899 Other long term (current) drug therapy: Secondary | ICD-10-CM | POA: Insufficient documentation

## 2020-09-16 LAB — CMP (CANCER CENTER ONLY)
ALT: 15 U/L (ref 0–44)
AST: 14 U/L — ABNORMAL LOW (ref 15–41)
Albumin: 3.3 g/dL — ABNORMAL LOW (ref 3.5–5.0)
Alkaline Phosphatase: 57 U/L (ref 38–126)
Anion gap: 9 (ref 5–15)
BUN: 20 mg/dL (ref 8–23)
CO2: 26 mmol/L (ref 22–32)
Calcium: 9.1 mg/dL (ref 8.9–10.3)
Chloride: 104 mmol/L (ref 98–111)
Creatinine: 1.16 mg/dL (ref 0.61–1.24)
GFR, Estimated: >60 mL/min (ref >60)
Glucose, Bld: 145 mg/dL — ABNORMAL HIGH (ref 70–99)
Potassium: 4.3 mmol/L (ref 3.5–5.1)
Sodium: 139 mmol/L (ref 135–145)
Total Bilirubin: 0.3 mg/dL (ref 0.3–1.2)
Total Protein: 6.3 g/dL — ABNORMAL LOW (ref 6.5–8.1)

## 2020-09-16 LAB — CBC WITH DIFFERENTIAL (CANCER CENTER ONLY)
Abs Immature Granulocytes: 0.04 10*3/uL (ref 0.00–0.07)
Basophils Absolute: 0 10*3/uL (ref 0.0–0.1)
Basophils Relative: 1 %
Eosinophils Absolute: 0.1 10*3/uL (ref 0.0–0.5)
Eosinophils Relative: 1 %
HCT: 33.8 % — ABNORMAL LOW (ref 39.0–52.0)
Hemoglobin: 11.7 g/dL — ABNORMAL LOW (ref 13.0–17.0)
Immature Granulocytes: 1 %
Lymphocytes Relative: 11 %
Lymphs Abs: 0.7 10*3/uL (ref 0.7–4.0)
MCH: 33.1 pg (ref 26.0–34.0)
MCHC: 34.6 g/dL (ref 30.0–36.0)
MCV: 95.8 fL (ref 80.0–100.0)
Monocytes Absolute: 0.5 10*3/uL (ref 0.1–1.0)
Monocytes Relative: 8 %
Neutro Abs: 5 10*3/uL (ref 1.7–7.7)
Neutrophils Relative %: 78 %
Platelet Count: 223 10*3/uL (ref 150–400)
RBC: 3.53 MIL/uL — ABNORMAL LOW (ref 4.22–5.81)
RDW: 12.9 % (ref 11.5–15.5)
WBC Count: 6.4 10*3/uL (ref 4.0–10.5)
nRBC: 0 % (ref 0.0–0.2)

## 2020-09-16 MED ORDER — TEMOZOLOMIDE 100 MG PO CAPS
100.0000 mg | ORAL_CAPSULE | Freq: Every day | ORAL | 0 refills | Status: DC
Start: 2020-09-16 — End: 2020-10-14
  Filled 2020-09-16: qty 5, 5d supply, fill #0
  Filled 2020-09-18: qty 5, 28d supply, fill #0

## 2020-09-16 MED ORDER — TEMOZOLOMIDE 140 MG PO CAPS
140.0000 mg | ORAL_CAPSULE | Freq: Every day | ORAL | 0 refills | Status: DC
Start: 2020-09-16 — End: 2020-10-14
  Filled 2020-09-16: qty 14, 14d supply, fill #0
  Filled 2020-09-18: qty 5, 28d supply, fill #0

## 2020-09-16 NOTE — Progress Notes (Signed)
[facility] [place], [place] [address] [phone]   Interval Evaluation  Date of Service: 09/16/20 Patient Name: Bryan [name] Patient MRN: [id number] Patient DOB: [birth year] Provider: Ventura Sellers, MD  Identifying Statement:  Bryan [name] is a 80 y.o. male with right frontal glioblastoma   Oncologic History: Oncology History   Glioblastoma, IDH-wildtype ([place])   03/07/2018 Surgery   Craniotomy, resection by Dr. Tommi Rumps ([facility]).  Path is Glioblastoma    03/29/2018 - 03/29/2018 Chemotherapy   The patient had temozolomide (TEMODAR) 140 MG capsule, 140 mg (100 % of original dose 140 mg), Oral, Daily, 1 of 1 cycle, Start date: 03/29/2018, End date: 05/10/2018 Dose modification: 140 mg (original dose 140 mg, Cycle 1)   for chemotherapy treatment.     04/04/2018 - 05/13/2018 Radiation Therapy   IMRT and concurrent Temozolomide with Dr. Tammi Klippel    06/09/2018 - 01/02/2019 Chemotherapy   The patient completed 6 cycles of adjuvant 5-day Temozolomide     05/12/2020 Progression   Progression of disease lateral and inferior to resection cavity   05/12/2020 -  Chemotherapy   Resumes 5-day Temozolomide         Biomarkers:  MGMT Unknown.  IDH 1/2 Wild type.  EGFR Expressed  TERT Unknown   Interval History: Bryan [name] presents today for follow up after MRI brain.  He has now completed cycle #10 of 5-day TMZ, he again tolerated chemo well, no significant issues.  He does complain of a bit more fatigue and confusion (per his wife) during week of chemo this month. Left sided weakness remains static. Speaking/fluency is otherwise stable from prior.  He is not independent with any ADLs at this time.  History is overall still limited by language impairments, wife assisted again with history.    H+P (02/16/18) Bryan [name] 80 y.o. male presented after family noted drooping of the left side of his face yesterday.  In addition,  he/they describe some cognitive changes, such as difficulty "finding words" in certain situations.  The patient also feels like his left leg is "lagging" a little behind his right lately.  He is walking on his own, however, and with no falls.  Denies headaches, seizures.   Medications: Current Outpatient Medications on File Prior to Visit  Medication Sig Dispense Refill   acetaminophen (TYLENOL) 325 MG tablet Take 2 tablets (650 mg total) by mouth every 6 (six) hours as needed for mild pain.     cetirizine-pseudoephedrine (ZYRTEC-D) 5-120 MG tablet Take 1 tablet by mouth 2 (two) times daily.     Cholecalciferol (D-3-5) 125 MCG (5000 UT) capsule Take 5,000 Units by mouth daily. Pt's wife states he is taking 1000 units daily     Cyanocobalamin (B-12) 1000 MCG LOZG Take 2 lozenges by mouth daily.     dexamethasone (DECADRON) 1 MG tablet TAKE 1 TABLET EVERY DAY 90 tablet 1   fluticasone (FLONASE) 50 MCG/ACT nasal spray Place 1 spray into both nostrils 2 (two) times daily.     levETIRAcetam (KEPPRA) 100 MG/ML solution TAKE 5 ML (500 MG TOTAL) BY MOUTH TWO  TIMES DAILY. 900 mL 1   levothyroxine (SYNTHROID, LEVOTHROID) 75 MCG tablet Take 1 tablet (75 mcg total) by mouth daily before breakfast. 30 tablet 0   ondansetron (ZOFRAN) 8 MG tablet TAKE 1 TABLET BY MOUTH 2 TIMES DAILY FOR NAUSEA/VOMITING. MAY TAKE 30-60 MINUTES PRIOR TO TEMODAR IF NAUSEA/VOMITING OCCURS. 30 tablet 1   temozolomide (TEMODAR) 100  MG capsule Take 1 capsule (100 mg total) by mouth daily. 5 capsule 0   temozolomide (TEMODAR) 140 MG capsule Take 1 capsule (140 mg total) by mouth daily. 5 capsule 0   zonisamide (ZONEGRAN) 100 MG capsule TAKE 1 CAPSULE EVERY DAY 90 capsule 3   No current facility-administered medications on file prior to visit.    Allergies:  Allergies  Allergen Reactions   Other     Cats, pollen   Past Medical History:  Past Medical History:  Diagnosis Date   Arthritis    Brain cancer ([place])    Glioblastoma    Diabetes mellitus without complication (HCC)    GERD (gastroesophageal reflux disease)    Hypercholesterolemia    Hypertension    Thyroid disease    Past Surgical History:  Past Surgical History:  Procedure Laterality Date   CRANIOTOMY     HERNIA REPAIR     squamous cell skin 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     Social History:  Social History   Socioeconomic History   Marital status: Married    Spouse name: Not on file   Number of children: Not on file   Years of education: Not on file   Highest education level: Not on file  Occupational History   Not on file  Tobacco Use   Smoking status: Former    Packs/day: 0.25    Years: 3.00    Pack years: 0.75    Types: Cigarettes    Quit date: 03/23/1961    Years since quitting: 59.5   Smokeless tobacco: Never  Vaping Use   Vaping Use: Never used  Substance and Sexual Activity   Alcohol use: Yes    Alcohol/week: 1.0 standard drink    Types: 1 Glasses of wine per week    Comment: weekly   Drug use: Never   Sexual activity: Not Currently  Other Topics Concern   Not on file  Social History Narrative   Married. Resides in [place].   Social Determinants of Health   Financial Resource Strain: Not on file  Food Insecurity: Not on file  Transportation Needs: Not on file  Physical Activity: Not on file  Stress: Not on file  Social Connections: Not on file  Intimate Partner Violence: Not on file   Family History:  Family History  Problem Relation Age of Onset   Diabetes Mellitus II Mother    ALS Father     Review of Systems: Constitutional: Denies fevers, chills or abnormal weight loss Eyes: Denies blurriness of vision Ears, nose, mouth, throat, and face: Denies mucositis or sore throat Respiratory: Denies cough, dyspnea or wheezes Cardiovascular: Denies palpitation, chest discomfort or lower extremity swelling Gastrointestinal:  Denies nausea, constipation, diarrhea GU: Denies dysuria or  incontinence Skin: Denies abnormal skin rashes Neurological: Per HPI Musculoskeletal: Denies joint pain, back or neck discomfort. No decrease in ROM Behavioral/Psych: Denies anxiety, disturbance in thought content, and mood instability  Physical Exam: Vitals:   09/16/20 1203  BP: (!) 144/62  Pulse: 79  Resp: 17  Temp: 98.7 F (37.1 C)  SpO2: 98%   KPS: 60. General: Alert, cooperative, pleasant, in no acute distress Head: Craniotomy scar noted, dry and intact. EENT: No conjunctival injection or scleral icterus. Oral mucosa moist Lungs: Resp effort normal Cardiac: Regular rate and rhythm Abdomen: Soft, non-distended abdomen Skin: No rashes cyanosis or petechiae. Extremities: no edema  Neurologic Exam: Mental Status: Awake, alert, attentive to examiner. Oriented to self and environment. Language  is fairly densely impaired with regards to fluency, comprehension and repetition.   Cranial Nerves: Visual acuity is grossly normal. Visual fields are full. Extra-ocular movements intact. No ptosis. L UMN facial paresis, tongue midline. Motor: Tone and bulk are normal. Power is 3/5 in left arm and 2/5 in leg, with absent fine motor function in left hand.  Left thenar atrophy noted. Reflexes are symmetric, no pathologic reflexes present. Intact finger to nose bilaterally Sensory: Intact to light touch and temperature Gait: Deferred  Labs: I have reviewed the data as listed    Component Value Date/Time   NA 139 09/16/2020 1147   K 4.3 09/16/2020 1147   CL 104 09/16/2020 1147   CO2 26 09/16/2020 1147   GLUCOSE 145 (H) 09/16/2020 1147   BUN 20 09/16/2020 1147   CREATININE 1.16 09/16/2020 1147   CALCIUM 9.1 09/16/2020 1147   PROT 6.3 (L) 09/16/2020 1147   ALBUMIN 3.3 (L) 09/16/2020 1147   AST 14 (L) 09/16/2020 1147   ALT 15 09/16/2020 1147   ALKPHOS 57 09/16/2020 1147   BILITOT 0.3 09/16/2020 1147   GFRNONAA >60 09/16/2020 1147   GFRAA >60 03/06/2019 1026   Lab Results   Component Value Date   WBC 6.4 09/16/2020   NEUTROABS 5.0 09/16/2020   HGB 11.7 (L) 09/16/2020   HCT 33.8 (L) 09/16/2020   MCV 95.8 09/16/2020   PLT 223 09/16/2020   Imaging:  [facility] Clinician Interpretation: I have personally reviewed the CNS images as listed.  My interpretation, in the context of the patient's clinical presentation, is stable disease  MR BRAIN W WO CONTRAST  Result Date: 09/16/2020 CLINICAL DATA:  Glioblastoma. Assess glioblastoma. Assess response to treatment. EXAM: MRI HEAD WITHOUT AND WITH CONTRAST TECHNIQUE: Multiplanar, multiecho pulse sequences of the brain and surrounding structures were obtained without and with intravenous contrast. CONTRAST:  39m MULTIHANCE GADOBENATE DIMEGLUMINE 529 MG/ML IV SOLN COMPARISON:  MRI head 07/12/2020 FINDINGS: Brain: Post resection site in the right frontal parietal operculum is stable. Chronic blood products. Irregular peripheral enhancement is stable. No enlargement or new enhancing lesion. Surrounding T2 and FLAIR hyperintensity extending into the frontal parietal white matter unchanged. Small linear subependymal enhancement along the lateral wall of the right lateral ventricle and shows mild improvement. 3 x 4 mm enhancing nodule in the right parietal cortex axial image number 103 shows interval improvement. No new areas of enhancement. Generalized atrophy.  No acute infarct. Vascular: Normal arterial flow voids. Skull and upper cervical spine: Right-sided craniotomy. Sinuses/Orbits: Complete opacification left maxillary sinus unchanged. Mild mucosal edema right maxillary sinus. Negative orbit Other: None IMPRESSION: Surgical resection site in the operculum on the right is stable. Improving areas of enhancement in the right ependymal white matter and right parietal lobe. No new areas of enhancement or tumor progression. Electronically Signed   By: CFranchot GalloM.D.   On: 09/16/2020 09:40    Assessment/Plan 1.  Glioblastoma,  IDH-wildtype ([facility]   Mr. MDragoneis clinically and radiographically stable today.  Labs are within normal limits.   We recommended continuing treatment with cycle #11 Temozolomide at 1550mm2, on for five days and off for twenty three days in twenty eight day cycles. The patient will have a complete blood count performed on days 21 and 28 of each cycle, and a comprehensive metabolic panel performed on day 28 of each cycle. Labs may need to be performed more often. Zofran will prescribed for home use for nausea/vomiting.   Chemotherapy should be held for the  following:  ANC less than 1,000  Platelets less than 100,000  LFT or creatinine greater than 2x ULN  If clinical concerns/contraindications develop  We ask that Caellum Mancil return to clinic in 1 months with labs for evaluation, prior to cycle #12.    Decadron may continue at 4m daily.  He should con't Keppra 1002mBID and con't Zonisamide 10019mID for seizure prevention.  We appreciate the opportunity to participate in the care of EdwBerton BonWe will follow up with him soon after family discussion.  I have spent a total of 30 minutes of face-to-face and non-face-to-face time, excluding clinical staff time, preparing to see patient, ordering tests and/or medications, counseling the patient, and independently interpreting results and communicating results to the patient/family/caregiver    ZacVentura SellersD Medical Director of Neuro-Oncology [facility] [place]/27/22 2:43 PM

## 2020-09-18 ENCOUNTER — Other Ambulatory Visit (HOSPITAL_COMMUNITY): Payer: Self-pay

## 2020-09-24 ENCOUNTER — Other Ambulatory Visit (HOSPITAL_COMMUNITY): Payer: Self-pay

## 2020-09-25 ENCOUNTER — Other Ambulatory Visit (HOSPITAL_COMMUNITY): Payer: Self-pay

## 2020-10-14 ENCOUNTER — Other Ambulatory Visit (HOSPITAL_COMMUNITY): Payer: Self-pay

## 2020-10-14 ENCOUNTER — Inpatient Hospital Stay (HOSPITAL_BASED_OUTPATIENT_CLINIC_OR_DEPARTMENT_OTHER): Payer: Medicare Other | Admitting: Internal Medicine

## 2020-10-14 ENCOUNTER — Other Ambulatory Visit: Payer: Self-pay

## 2020-10-14 ENCOUNTER — Inpatient Hospital Stay: Payer: Medicare Other | Attending: Internal Medicine

## 2020-10-14 VITALS — BP 132/60 | HR 69 | Temp 96.3°F | Resp 18 | Wt 154.5 lb

## 2020-10-14 DIAGNOSIS — Z87891 Personal history of nicotine dependence: Secondary | ICD-10-CM | POA: Diagnosis not present

## 2020-10-14 DIAGNOSIS — Z79899 Other long term (current) drug therapy: Secondary | ICD-10-CM | POA: Diagnosis not present

## 2020-10-14 DIAGNOSIS — C711 Malignant neoplasm of frontal lobe: Secondary | ICD-10-CM | POA: Insufficient documentation

## 2020-10-14 DIAGNOSIS — Z85828 Personal history of other malignant neoplasm of skin: Secondary | ICD-10-CM | POA: Insufficient documentation

## 2020-10-14 DIAGNOSIS — R569 Unspecified convulsions: Secondary | ICD-10-CM

## 2020-10-14 LAB — CMP (CANCER CENTER ONLY)
ALT: 20 U/L (ref 0–44)
AST: 13 U/L — ABNORMAL LOW (ref 15–41)
Albumin: 3.5 g/dL (ref 3.5–5.0)
Alkaline Phosphatase: 53 U/L (ref 38–126)
Anion gap: 8 (ref 5–15)
BUN: 23 mg/dL (ref 8–23)
CO2: 25 mmol/L (ref 22–32)
Calcium: 9.1 mg/dL (ref 8.9–10.3)
Chloride: 105 mmol/L (ref 98–111)
Creatinine: 1.32 mg/dL — ABNORMAL HIGH (ref 0.61–1.24)
GFR, Estimated: 55 mL/min — ABNORMAL LOW (ref >60)
Glucose, Bld: 198 mg/dL — ABNORMAL HIGH (ref 70–99)
Potassium: 4.3 mmol/L (ref 3.5–5.1)
Sodium: 138 mmol/L (ref 135–145)
Total Bilirubin: 0.4 mg/dL (ref 0.3–1.2)
Total Protein: 6.2 g/dL — ABNORMAL LOW (ref 6.5–8.1)

## 2020-10-14 LAB — CBC WITH DIFFERENTIAL (CANCER CENTER ONLY)
Abs Immature Granulocytes: 0.03 10*3/uL (ref 0.00–0.07)
Basophils Absolute: 0 10*3/uL (ref 0.0–0.1)
Basophils Relative: 0 %
Eosinophils Absolute: 0.1 10*3/uL (ref 0.0–0.5)
Eosinophils Relative: 2 %
HCT: 35 % — ABNORMAL LOW (ref 39.0–52.0)
Hemoglobin: 12 g/dL — ABNORMAL LOW (ref 13.0–17.0)
Immature Granulocytes: 1 %
Lymphocytes Relative: 23 %
Lymphs Abs: 1.2 10*3/uL (ref 0.7–4.0)
MCH: 33.5 pg (ref 26.0–34.0)
MCHC: 34.3 g/dL (ref 30.0–36.0)
MCV: 97.8 fL (ref 80.0–100.0)
Monocytes Absolute: 0.7 10*3/uL (ref 0.1–1.0)
Monocytes Relative: 14 %
Neutro Abs: 3.1 10*3/uL (ref 1.7–7.7)
Neutrophils Relative %: 60 %
Platelet Count: 182 10*3/uL (ref 150–400)
RBC: 3.58 MIL/uL — ABNORMAL LOW (ref 4.22–5.81)
RDW: 12.9 % (ref 11.5–15.5)
WBC Count: 5.1 10*3/uL (ref 4.0–10.5)
nRBC: 0 % (ref 0.0–0.2)

## 2020-10-14 MED ORDER — TEMOZOLOMIDE 140 MG PO CAPS
140.0000 mg | ORAL_CAPSULE | Freq: Every day | ORAL | 0 refills | Status: DC
  Filled 2020-10-14: qty 14, 14d supply, fill #0
  Filled 2020-11-01: qty 5, 5d supply, fill #0

## 2020-10-14 MED ORDER — TEMOZOLOMIDE 100 MG PO CAPS
100.0000 mg | ORAL_CAPSULE | Freq: Every day | ORAL | 0 refills | Status: DC
  Filled 2020-10-14 – 2020-11-01 (×2): qty 5, 5d supply, fill #0

## 2020-10-14 NOTE — Progress Notes (Signed)
[facility] [place], [place] [address] [phone]   Interval Evaluation  Date of Service: 10/14/20 Patient Name: Arlan Birks Patient MRN: [id number] Patient DOB: January 19, 1941 Provider: Ventura Sellers, MD  Identifying Statement:  Tip Atienza is a 80 y.o. male with right frontal glioblastoma   Oncologic History: Oncology History   Glioblastoma, IDH-wildtype ([place])   03/07/2018 Surgery   Craniotomy, resection by Dr. Tommi Rumps ([facility]).  Path is Glioblastoma    03/29/2018 - 03/29/2018 Chemotherapy   The patient had temozolomide (TEMODAR) 140 MG capsule, 140 mg (100 % of original dose 140 mg), Oral, Daily, 1 of 1 cycle, Start date: 03/29/2018, End date: 05/10/2018 Dose modification: 140 mg (original dose 140 mg, Cycle 1)   for chemotherapy treatment.     04/04/2018 - 05/13/2018 Radiation Therapy   IMRT and concurrent Temozolomide with Dr. Tammi Klippel    06/09/2018 - 01/02/2019 Chemotherapy   The patient completed 6 cycles of adjuvant 5-day Temozolomide     05/12/2020 Progression   Progression of disease lateral and inferior to resection cavity   05/12/2020 -  Chemotherapy   Resumes 5-day Temozolomide         Biomarkers:  MGMT Unknown.  IDH 1/2 Wild type.  EGFR Expressed  TERT Unknown   Interval History: Summit Arroyave presents today for follow up after MRI brain.  He has now completed cycle #11 of 5-day TMZ, he again tolerated chemo well, no significant issues.  Continues to have poor short term memory, language impairments. Left sided weakness remains static. Speaking/fluency is otherwise stable from prior.  He is not independent with any ADLs at this time.  History is overall still limited by language deficits, wife assisted again with history.    H+P (02/16/18) [name] 80 y.o. male presented after family noted drooping of the left side of his face yesterday.  In addition, he/they describe some cognitive changes,  such as difficulty "finding words" in certain situations.  The patient also feels like his left leg is "lagging" a little behind his right lately.  He is walking on his own, however, and with no falls.  Denies headaches, seizures.   Medications: Current Outpatient Medications on File Prior to Visit  Medication Sig Dispense Refill   acetaminophen (TYLENOL) 325 MG tablet Take 2 tablets (650 mg total) by mouth every 6 (six) hours as needed for mild pain.     cetirizine-pseudoephedrine (ZYRTEC-D) 5-120 MG tablet Take 1 tablet by mouth 2 (two) times daily.     Cholecalciferol (D-3-5) 125 MCG (5000 UT) capsule Take 5,000 Units by mouth daily. Pt's wife states he is taking 1000 units daily     Cyanocobalamin (B-12) 1000 MCG LOZG Take 2 lozenges by mouth daily.     dexamethasone (DECADRON) 1 MG tablet TAKE 1 TABLET EVERY DAY 90 tablet 1   fluticasone (FLONASE) 50 MCG/ACT nasal spray Place 1 spray into both nostrils 2 (two) times daily.     levETIRAcetam (KEPPRA) 100 MG/ML solution TAKE 5 ML (500 MG TOTAL) BY MOUTH TWO  TIMES DAILY. 900 mL 1   levothyroxine (SYNTHROID, LEVOTHROID) 75 MCG tablet Take 1 tablet (75 mcg total) by mouth daily before breakfast. 30 tablet 0   ondansetron (ZOFRAN) 8 MG tablet TAKE 1 TABLET BY MOUTH 2 TIMES DAILY FOR NAUSEA/VOMITING. MAY TAKE 30-60 MINUTES PRIOR TO TEMODAR IF NAUSEA/VOMITING OCCURS. 30 tablet 1   temozolomide (TEMODAR) 100 MG capsule Take 1 capsule (100 mg total) by mouth  daily. 5 capsule 0   temozolomide (TEMODAR) 140 MG capsule Take 1 capsule (140 mg total) by mouth daily. 5 capsule 0   zonisamide (ZONEGRAN) 100 MG capsule TAKE 1 CAPSULE EVERY DAY 90 capsule 3   No current facility-administered medications on file prior to visit.    Allergies:  Allergies  Allergen Reactions   Other     Cats, pollen   Past Medical History:  Past Medical History:  Diagnosis Date   Arthritis    Brain cancer ([place])    Glioblastoma   Diabetes mellitus without complication  (HCC)    GERD (gastroesophageal reflux disease)    Hypercholesterolemia    Hypertension    Thyroid disease    Past Surgical History:  Past Surgical History:  Procedure Laterality Date   CRANIOTOMY     HERNIA REPAIR     squamous cell skin 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     Social History:  Social History   Socioeconomic History   Marital status: Married    Spouse name: Not on file   Number of children: Not on file   Years of education: Not on file   Highest education level: Not on file  Occupational History   Not on file  Tobacco Use   Smoking status: Former    Packs/day: 0.25    Years: 3.00    Pack years: 0.75    Types: Cigarettes    Quit date: 03/23/1961    Years since quitting: 59.6   Smokeless tobacco: Never  Vaping Use   Vaping Use: Never used  Substance and Sexual Activity   Alcohol use: Yes    Alcohol/week: 1.0 standard drink    Types: 1 Glasses of wine per week    Comment: weekly   Drug use: Never   Sexual activity: Not Currently  Other Topics Concern   Not on file  Social History Narrative   Married. Resides in [place].   Social Determinants of Health   Financial Resource Strain: Not on file  Food Insecurity: Not on file  Transportation Needs: Not on file  Physical Activity: Not on file  Stress: Not on file  Social Connections: Not on file  Intimate Partner Violence: Not on file   Family History:  Family History  Problem Relation Age of Onset   Diabetes Mellitus II Mother    ALS Father     Review of Systems: Constitutional: Denies fevers, chills or abnormal weight loss Eyes: Denies blurriness of vision Ears, nose, mouth, throat, and face: Denies mucositis or sore throat Respiratory: Denies cough, dyspnea or wheezes Cardiovascular: Denies palpitation, chest discomfort or lower extremity swelling Gastrointestinal:  Denies nausea, constipation, diarrhea GU: Denies dysuria or incontinence Skin: Denies abnormal skin  rashes Neurological: Per HPI Musculoskeletal: Denies joint pain, back or neck discomfort. No decrease in ROM Behavioral/Psych: Denies anxiety, disturbance in thought content, and mood instability  Physical Exam: Vitals:   10/14/20 1027  BP: 132/60  Pulse: 69  Resp: 18  Temp: (!) 96.3 F (35.7 C)  SpO2: 100%   KPS: 60. General: Alert, cooperative, pleasant, in no acute distress Head: Craniotomy scar noted, dry and intact. EENT: No conjunctival injection or scleral icterus. Oral mucosa moist Lungs: Resp effort normal Cardiac: Regular rate and rhythm Abdomen: Soft, non-distended abdomen Skin: No rashes cyanosis or petechiae. Extremities: no edema  Neurologic Exam: Mental Status: Awake, alert, attentive to examiner. Oriented to self and environment. Language is fairly densely impaired with regards to fluency, comprehension and  repetition.   Cranial Nerves: Visual acuity is grossly normal. Visual fields are full. Extra-ocular movements intact. No ptosis. L UMN facial paresis, tongue midline. Motor: Tone and bulk are normal. Power is 3/5 in left arm and 2/5 in leg, with absent fine motor function in left hand.  Left thenar atrophy noted. Reflexes are symmetric, no pathologic reflexes present. Intact finger to nose bilaterally Sensory: Intact to light touch and temperature Gait: Deferred  Labs: I have reviewed the data as listed    Component Value Date/Time   NA 139 09/16/2020 1147   K 4.3 09/16/2020 1147   CL 104 09/16/2020 1147   CO2 26 09/16/2020 1147   GLUCOSE 145 (H) 09/16/2020 1147   BUN 20 09/16/2020 1147   CREATININE 1.16 09/16/2020 1147   CALCIUM 9.1 09/16/2020 1147   PROT 6.3 (L) 09/16/2020 1147   ALBUMIN 3.3 (L) 09/16/2020 1147   AST 14 (L) 09/16/2020 1147   ALT 15 09/16/2020 1147   ALKPHOS 57 09/16/2020 1147   BILITOT 0.3 09/16/2020 1147   GFRNONAA >60 09/16/2020 1147   GFRAA >60 03/06/2019 1026   Lab Results  Component Value Date   WBC 6.4 09/16/2020    NEUTROABS 5.0 09/16/2020   HGB 11.7 (L) 09/16/2020   HCT 33.8 (L) 09/16/2020   MCV 95.8 09/16/2020   PLT 223 09/16/2020    Assessment/Plan 1.  Glioblastoma, IDH-wildtype [facility])   Mr. Schoch is clinically stable today.  Labs are within normal limits.  No new or progressive deficits.  We recommended continuing treatment with cycle #12 Temozolomide at 176m/m2, on for five days and off for twenty three days in twenty eight day cycles. The patient will have a complete blood count performed on days 21 and 28 of each cycle, and a comprehensive metabolic panel performed on day 28 of each cycle. Labs may need to be performed more often. Zofran will prescribed for home use for nausea/vomiting.   Chemotherapy should be held for the following:  ANC less than 1,000  Platelets less than 100,000  LFT or creatinine greater than 2x ULN  If clinical concerns/contraindications develop  We ask that EBerton Bonreturn to clinic in 1 months with MRI brain for evaluation.  Decadron may continue at 110mdaily.  He should con't Keppra 100085mID and con't Zonisamide 100m71mD for seizure prevention.  We appreciate the opportunity to participate in the care of EdwaBerton Bone will follow up with him soon after family discussion.  I have spent a total of 30 minutes of face-to-face and non-face-to-face time, excluding clinical staff time, preparing to see patient, ordering tests and/or medications, counseling the patient, and independently interpreting results and communicating results to the patient/family/caregiver    ZachVentura Sellers Medical Director of Neuro-Oncology [facility]25/22 10:22 AM

## 2020-10-16 ENCOUNTER — Other Ambulatory Visit (HOSPITAL_COMMUNITY): Payer: Self-pay

## 2020-10-25 ENCOUNTER — Other Ambulatory Visit (HOSPITAL_COMMUNITY): Payer: Self-pay

## 2020-10-31 ENCOUNTER — Other Ambulatory Visit: Payer: Self-pay | Admitting: Radiation Therapy

## 2020-11-01 ENCOUNTER — Other Ambulatory Visit (HOSPITAL_COMMUNITY): Payer: Self-pay

## 2020-11-01 MED FILL — Ondansetron HCl Tab 8 MG: ORAL | 30 days supply | Qty: 30 | Fill #0 | Status: AC

## 2020-11-08 ENCOUNTER — Ambulatory Visit
Admission: RE | Admit: 2020-11-08 | Discharge: 2020-11-08 | Disposition: A | Discharge status: Home / Self Care | Payer: Medicare Other | Source: Ambulatory Visit | Attending: Internal Medicine | Admitting: Internal Medicine

## 2020-11-08 ENCOUNTER — Other Ambulatory Visit (HOSPITAL_COMMUNITY): Payer: Self-pay

## 2020-11-08 DIAGNOSIS — C711 Malignant neoplasm of frontal lobe: Secondary | ICD-10-CM

## 2020-11-08 MED ORDER — GADOBENATE DIMEGLUMINE 529 MG/ML IV SOLN
15.0000 mL | Freq: Once | INTRAVENOUS | Status: AC | PRN
  Administered 2020-11-08: 15 mL via INTRAVENOUS

## 2020-11-11 ENCOUNTER — Inpatient Hospital Stay: Payer: Medicare Other | Attending: Internal Medicine

## 2020-11-21 ENCOUNTER — Other Ambulatory Visit (HOSPITAL_COMMUNITY): Payer: Self-pay

## 2020-11-26 ENCOUNTER — Other Ambulatory Visit: Payer: Self-pay

## 2020-11-26 ENCOUNTER — Telehealth: Payer: Self-pay

## 2020-11-26 ENCOUNTER — Other Ambulatory Visit: Payer: Self-pay | Admitting: *Deleted

## 2020-11-26 ENCOUNTER — Inpatient Hospital Stay (HOSPITAL_BASED_OUTPATIENT_CLINIC_OR_DEPARTMENT_OTHER): Payer: Medicare Other | Admitting: Internal Medicine

## 2020-11-26 ENCOUNTER — Inpatient Hospital Stay: Payer: Medicare Other | Attending: Internal Medicine

## 2020-11-26 VITALS — BP 143/67 | HR 77 | Temp 97.7°F | Resp 17 | Ht 64.0 in | Wt 156.4 lb

## 2020-11-26 DIAGNOSIS — I10 Essential (primary) hypertension: Secondary | ICD-10-CM | POA: Insufficient documentation

## 2020-11-26 DIAGNOSIS — R569 Unspecified convulsions: Secondary | ICD-10-CM

## 2020-11-26 DIAGNOSIS — Z87891 Personal history of nicotine dependence: Secondary | ICD-10-CM | POA: Diagnosis not present

## 2020-11-26 DIAGNOSIS — E119 Type 2 diabetes mellitus without complications: Secondary | ICD-10-CM | POA: Insufficient documentation

## 2020-11-26 DIAGNOSIS — C711 Malignant neoplasm of frontal lobe: Secondary | ICD-10-CM | POA: Insufficient documentation

## 2020-11-26 DIAGNOSIS — R609 Edema, unspecified: Secondary | ICD-10-CM

## 2020-11-26 LAB — CBC WITH DIFFERENTIAL (CANCER CENTER ONLY)
Abs Immature Granulocytes: 0.12 10*3/uL — ABNORMAL HIGH (ref 0.00–0.07)
Basophils Absolute: 0 10*3/uL (ref 0.0–0.1)
Basophils Relative: 1 %
Eosinophils Absolute: 0.1 10*3/uL (ref 0.0–0.5)
Eosinophils Relative: 2 %
HCT: 30.8 % — ABNORMAL LOW (ref 39.0–52.0)
Hemoglobin: 10.5 g/dL — ABNORMAL LOW (ref 13.0–17.0)
Immature Granulocytes: 2 %
Lymphocytes Relative: 12 %
Lymphs Abs: 0.8 10*3/uL (ref 0.7–4.0)
MCH: 32.8 pg (ref 26.0–34.0)
MCHC: 34.1 g/dL (ref 30.0–36.0)
MCV: 96.3 fL (ref 80.0–100.0)
Monocytes Absolute: 0.6 10*3/uL (ref 0.1–1.0)
Monocytes Relative: 9 %
Neutro Abs: 4.9 10*3/uL (ref 1.7–7.7)
Neutrophils Relative %: 74 %
Platelet Count: 186 10*3/uL (ref 150–400)
RBC: 3.2 MIL/uL — ABNORMAL LOW (ref 4.22–5.81)
RDW: 13.1 % (ref 11.5–15.5)
WBC Count: 6.5 10*3/uL (ref 4.0–10.5)
nRBC: 0 % (ref 0.0–0.2)

## 2020-11-26 LAB — CMP (CANCER CENTER ONLY)
ALT: 16 U/L (ref 0–44)
AST: 9 U/L — ABNORMAL LOW (ref 15–41)
Albumin: 2.9 g/dL — ABNORMAL LOW (ref 3.5–5.0)
Alkaline Phosphatase: 55 U/L (ref 38–126)
Anion gap: 8 (ref 5–15)
BUN: 20 mg/dL (ref 8–23)
CO2: 22 mmol/L (ref 22–32)
Calcium: 8.9 mg/dL (ref 8.9–10.3)
Chloride: 109 mmol/L (ref 98–111)
Creatinine: 1.12 mg/dL (ref 0.61–1.24)
GFR, Estimated: >60 mL/min (ref >60)
Glucose, Bld: 177 mg/dL — ABNORMAL HIGH (ref 70–99)
Potassium: 3.8 mmol/L (ref 3.5–5.1)
Sodium: 139 mmol/L (ref 135–145)
Total Bilirubin: <0.2 mg/dL — ABNORMAL LOW (ref 0.3–1.2)
Total Protein: 6 g/dL — ABNORMAL LOW (ref 6.5–8.1)

## 2020-11-26 MED ORDER — CEPHALEXIN 500 MG PO CAPS: 500.0000 mg | ORAL_CAPSULE | Freq: Four times a day (QID) | ORAL | 0 refills | Status: AC

## 2020-11-26 NOTE — Progress Notes (Signed)
[facility] [place], [place] [address] [phone]   Interval Evaluation  Date of Service: 11/26/20 Patient Name: Montford Barg Patient MRN: [id number] Patient DOB: Jul 09, 1940 Provider: Ventura Sellers, MD  Identifying Statement:  Damareon Lanni is a 80 y.o. male with right frontal glioblastoma   Oncologic History: Oncology History   Glioblastoma, IDH-wildtype ([place])   03/07/2018 Surgery   Craniotomy, resection by Dr. Tommi Rumps ([facility]).  Path is Glioblastoma   03/29/2018 - 03/29/2018 Chemotherapy   The patient had temozolomide (TEMODAR) 140 MG capsule, 140 mg (100 % of original dose 140 mg), Oral, Daily, 1 of 1 cycle, Start date: 03/29/2018, End date: 05/10/2018 Dose modification: 140 mg (original dose 140 mg, Cycle 1)   for chemotherapy treatment.     04/04/2018 - 05/13/2018 Radiation Therapy   IMRT and concurrent Temozolomide with Dr. Tammi Klippel   06/09/2018 - 01/02/2019 Chemotherapy   The patient completed 6 cycles of adjuvant 5-day Temozolomide     05/12/2020 Progression   Progression of disease lateral and inferior to resection cavity   05/12/2020 -  Chemotherapy   Resumes 5-day Temozolomide         Biomarkers:  MGMT Unknown.  IDH 1/2 Wild type.  EGFR Expressed  TERT Unknown   Interval History: Ingram Onnen presents today for follow up after MRI brain.  He has now completed cycle #12 of 5-day TMZ, he again tolerated chemo well, no significant issues.  Today he describes redness, swelling of his left lower leg.  There may have been a bug bite which preceded the reaction.  It has worsened over past several days.  Continues to have poor short term memory, language impairments. Left sided weakness remains static. Speaking/fluency is otherwise stable from prior.  He is not independent with any ADLs at this time.  History is overall still limited by language deficits, wife assisted again with history.    H+P (02/16/18)  [name] 80 y.o. male presented after family noted drooping of the left side of his face yesterday.  In addition, he/they describe some cognitive changes, such as difficulty "finding words" in certain situations.  The patient also feels like his left leg is "lagging" a little behind his right lately.  He is walking on his own, however, and with no falls.  Denies headaches, seizures.   Medications: Current Outpatient Medications on File Prior to Visit  Medication Sig Dispense Refill   acetaminophen (TYLENOL) 325 MG tablet Take 2 tablets (650 mg total) by mouth every 6 (six) hours as needed for mild pain.     cetirizine-pseudoephedrine (ZYRTEC-D) 5-120 MG tablet Take 1 tablet by mouth 2 (two) times daily.     Cholecalciferol (D-3-5) 125 MCG (5000 UT) capsule Take 5,000 Units by mouth daily. Pt's wife states he is taking 1000 units daily     Cyanocobalamin (B-12) 1000 MCG LOZG Take 2 lozenges by mouth daily.     dexamethasone (DECADRON) 1 MG tablet TAKE 1 TABLET EVERY DAY 90 tablet 1   fluticasone (FLONASE) 50 MCG/ACT nasal spray Place 1 spray into both nostrils 2 (two) times daily.     levETIRAcetam (KEPPRA) 100 MG/ML solution TAKE 5 ML (500 MG TOTAL) BY MOUTH TWO  TIMES DAILY. 900 mL 1   levothyroxine (SYNTHROID, LEVOTHROID) 75 MCG tablet Take 1 tablet (75 mcg total) by mouth daily before breakfast. 30 tablet 0   ondansetron (ZOFRAN) 8 MG tablet TAKE 1 TABLET BY MOUTH 2 TIMES DAILY FOR  NAUSEA/VOMITING. MAY TAKE 30-60 MINUTES PRIOR TO TEMODAR IF NAUSEA/VOMITING OCCURS. 30 tablet 1   temozolomide (TEMODAR) 100 MG capsule Take 1 capsule (100 mg total) by mouth daily. 5 capsule 0   temozolomide (TEMODAR) 140 MG capsule Take 1 capsule (140 mg total) by mouth daily. 5 capsule 0   zonisamide (ZONEGRAN) 100 MG capsule TAKE 1 CAPSULE EVERY DAY 90 capsule 3   No current facility-administered medications on file prior to visit.    Allergies:  Allergies  Allergen Reactions   Other     Cats, pollen    Past Medical History:  Past Medical History:  Diagnosis Date   Arthritis    Brain cancer ([place])    Glioblastoma   Diabetes mellitus without complication (HCC)    GERD (gastroesophageal reflux disease)    Hypercholesterolemia    Hypertension    Thyroid disease    Past Surgical History:  Past Surgical History:  Procedure Laterality Date   CRANIOTOMY     HERNIA REPAIR     squamous cell skin 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     Social History:  Social History   Socioeconomic History   Marital status: Married    Spouse name: Not on file   Number of children: Not on file   Years of education: Not on file   Highest education level: Not on file  Occupational History   Not on file  Tobacco Use   Smoking status: Former    Packs/day: 0.25    Years: 3.00    Pack years: 0.75    Types: Cigarettes    Quit date: 03/23/1961    Years since quitting: 59.7   Smokeless tobacco: Never  Vaping Use   Vaping Use: Never used  Substance and Sexual Activity   Alcohol use: Yes    Alcohol/week: 1.0 standard drink    Types: 1 Glasses of wine per week    Comment: weekly   Drug use: Never   Sexual activity: Not Currently  Other Topics Concern   Not on file  Social History Narrative   Married. Resides in [place].   Social Determinants of Health   Financial Resource Strain: Not on file  Food Insecurity: Not on file  Transportation Needs: Not on file  Physical Activity: Not on file  Stress: Not on file  Social Connections: Not on file  Intimate Partner Violence: Not on file   Family History:  Family History  Problem Relation Age of Onset   Diabetes Mellitus II Mother    ALS Father     Review of Systems: Constitutional: Denies fevers, chills or abnormal weight loss Eyes: Denies blurriness of vision Ears, nose, mouth, throat, and face: Denies mucositis or sore throat Respiratory: Denies cough, dyspnea or wheezes Cardiovascular: Denies palpitation, chest discomfort  or lower extremity swelling Gastrointestinal:  Denies nausea, constipation, diarrhea GU: Denies dysuria or incontinence Skin: Denies abnormal skin rashes Neurological: Per HPI Musculoskeletal: Denies joint pain, back or neck discomfort. No decrease in ROM Behavioral/Psych: Denies anxiety, disturbance in thought content, and mood instability  Physical Exam: Vitals:   11/26/20 1145  BP: (!) 143/67  Pulse: 77  Resp: 17  Temp: 97.7 F (36.5 C)  SpO2: 99%   KPS: 60. General: Alert, cooperative, pleasant, in no acute distress Head: Craniotomy scar noted, dry and intact. EENT: No conjunctival injection or scleral icterus. Oral mucosa moist Lungs: Resp effort normal Cardiac: Regular rate and rhythm Abdomen: Soft, non-distended abdomen Skin: No rashes cyanosis or petechiae.  Extremities: left lower leg warm, erythematous, edema is +3  Neurologic Exam: Mental Status: Awake, alert, attentive to examiner. Oriented to self and environment. Language is fairly densely impaired with regards to fluency, comprehension and repetition.   Cranial Nerves: Visual acuity is grossly normal. Visual fields are full. Extra-ocular movements intact. No ptosis. L UMN facial paresis, tongue midline. Motor: Tone and bulk are normal. Power is 3/5 in left arm and 2/5 in leg, with absent fine motor function in left hand.  Left thenar atrophy noted. Reflexes are symmetric, no pathologic reflexes present. Intact finger to nose bilaterally Sensory: Intact to light touch and temperature Gait: Deferred  Labs: I have reviewed the data as listed    Component Value Date/Time   NA 138 10/14/2020 1016   K 4.3 10/14/2020 1016   CL 105 10/14/2020 1016   CO2 25 10/14/2020 1016   GLUCOSE 198 (H) 10/14/2020 1016   BUN 23 10/14/2020 1016   CREATININE 1.32 (H) 10/14/2020 1016   CALCIUM 9.1 10/14/2020 1016   PROT 6.2 (L) 10/14/2020 1016   ALBUMIN 3.5 10/14/2020 1016   AST 13 (L) 10/14/2020 1016   ALT 20 10/14/2020 1016    ALKPHOS 53 10/14/2020 1016   BILITOT 0.4 10/14/2020 1016   GFRNONAA 55 (L) 10/14/2020 1016   GFRAA >60 03/06/2019 1026   Lab Results  Component Value Date   WBC 6.5 11/26/2020   NEUTROABS 4.9 11/26/2020   HGB 10.5 (L) 11/26/2020   HCT 30.8 (L) 11/26/2020   MCV 96.3 11/26/2020   PLT 186 11/26/2020   Imaging:  [place] Clinician Interpretation: I have personally reviewed the CNS images as listed.  My interpretation, in the context of the patient's clinical presentation, is treatment effect vs true progression  MR BRAIN W WO CONTRAST  Result Date: 11/10/2020 CLINICAL DATA:  Follow-up frontal lobe tumor EXAM: MRI HEAD WITHOUT AND WITH CONTRAST TECHNIQUE: Multiplanar, multiecho pulse sequences of the brain and surrounding structures were obtained without and with intravenous contrast. CONTRAST:  17m MULTIHANCE GADOBENATE DIMEGLUMINE 529 MG/ML IV SOLN COMPARISON:  09/13/2020 FINDINGS: Brain: Approximately 3 cm necrotic appearing mass with peripheral enhancement at the right insula with extensive adjacent T2 signal. No progressive enhancement, T2 signal, or mass effect at this level. Wallerian changes descent into the brainstem. Patchy curvilinear and nodular enhancement superior to the body of the right lateral ventricle with minimal progression. No acute infarct, acute hemorrhage, hydrocephalus, or shift. Vascular: Preserved flow voids and vascular enhancements Skull and upper cervical spine: Unremarkable right craniotomy. Sinuses/Orbits: Chronic left maxillary sinus opacification. IMPRESSION: Minimal increase in periventricular enhancement at the body of the right lateral ventricle. Much more extensive enhancement and T2 signal elsewhere in the right cerebral hemisphere is unchanged. Electronically Signed   By: JMonte FantasiaM.D.   On: 11/10/2020 05:57    Assessment/Plan 1.  Glioblastoma, IDH-wildtype ([facility]   Mr. MMonteforteis clinically stable today from neurologic standpoint.  No new or  progressive deficits.  MRI demonstrates small novel focus of enhancement along the border of the right lateral ventricle.  Etiology is either progression of disease, or alternate disruption of blood-brain barrier.  Rest of bulky tumor and FLAIR signal remains stable.  For left leg, differential includes DVT and cellulitis.  Recommended ASAP DVT doppler study, and also starting Keflex 5072mQID x5 days.  If ultrasound negative and leg not improved in 1 week, we will reassess, consider broader abx or diuresis.   Otherwise se recommended repeating an MRI in 1 month  for better characterization of this new focus.  Will hold off on further chemotherapy until that time.  Decadron may continue at 64m daily.  He should con't Keppra 10074mBID and con't Zonisamide 10059mID for seizure prevention.  He is agreeable with this plan.  We ask that EdwTauheed Mcfaydenturn to clinic in 1 months with MRI brain for evaluation.  We appreciate the opportunity to participate in the care of EdwBerton BonWe will follow up with him soon after family discussion.  I have spent a total of 40 minutes of face-to-face and non-face-to-face time, excluding clinical staff time, preparing to see patient, ordering tests and/or medications, counseling the patient, and independently interpreting results and communicating results to the patient/family/caregiver    ZacVentura SellersD Medical Director of Neuro-Oncology [facility] [place]/06/22 11:48 AM

## 2020-11-26 NOTE — Telephone Encounter (Signed)
Called and informed patient's wife, Juliann Pulse, about scheduled doppler appointment tomorrow, September 7th at 11:00 am at [facility]. Wife verbalized an understanding of the appointment date and time and confirmed that she and her husband would be able to make the appointment. All questions answered during call.

## 2020-11-27 ENCOUNTER — Ambulatory Visit (HOSPITAL_COMMUNITY)
Admission: RE | Admit: 2020-11-27 | Discharge: 2020-11-27 | Disposition: A | Discharge status: Home / Self Care | Payer: Medicare Other | Source: Ambulatory Visit | Attending: Internal Medicine | Admitting: Internal Medicine

## 2020-11-27 ENCOUNTER — Other Ambulatory Visit (HOSPITAL_COMMUNITY): Payer: Self-pay | Admitting: *Deleted

## 2020-11-27 ENCOUNTER — Other Ambulatory Visit (HOSPITAL_COMMUNITY): Payer: Self-pay

## 2020-11-27 DIAGNOSIS — M7989 Other specified soft tissue disorders: Secondary | ICD-10-CM

## 2020-11-27 NOTE — Progress Notes (Signed)
Left lower extremity venous duplex has been completed. Preliminary results can be found in CV Proc through chart review.  Results were given to Dr. Mickeal Skinner.  11/27/20 11:09 AM Bryan [name] RVT

## 2020-12-03 ENCOUNTER — Other Ambulatory Visit: Payer: Self-pay | Admitting: Radiation Therapy

## 2020-12-09 DIAGNOSIS — I2510 Atherosclerotic heart disease of native coronary artery without angina pectoris: Secondary | ICD-10-CM | POA: Diagnosis not present

## 2020-12-09 DIAGNOSIS — E538 Deficiency of other specified B group vitamins: Secondary | ICD-10-CM | POA: Diagnosis not present

## 2020-12-09 DIAGNOSIS — G8190 Hemiplegia, unspecified affecting unspecified side: Secondary | ICD-10-CM | POA: Diagnosis not present

## 2020-12-09 DIAGNOSIS — E119 Type 2 diabetes mellitus without complications: Secondary | ICD-10-CM | POA: Diagnosis not present

## 2020-12-09 DIAGNOSIS — I10 Essential (primary) hypertension: Secondary | ICD-10-CM | POA: Diagnosis not present

## 2020-12-09 DIAGNOSIS — I700 Atherosclerosis of aorta: Secondary | ICD-10-CM | POA: Diagnosis not present

## 2020-12-09 DIAGNOSIS — E039 Hypothyroidism, unspecified: Secondary | ICD-10-CM | POA: Diagnosis not present

## 2020-12-09 DIAGNOSIS — E785 Hyperlipidemia, unspecified: Secondary | ICD-10-CM | POA: Diagnosis not present

## 2020-12-09 DIAGNOSIS — N1831 Chronic kidney disease, stage 3a: Secondary | ICD-10-CM | POA: Diagnosis not present

## 2020-12-09 DIAGNOSIS — N401 Enlarged prostate with lower urinary tract symptoms: Secondary | ICD-10-CM | POA: Diagnosis not present

## 2020-12-09 DIAGNOSIS — C719 Malignant neoplasm of brain, unspecified: Secondary | ICD-10-CM | POA: Diagnosis not present

## 2020-12-09 DIAGNOSIS — D6481 Anemia due to antineoplastic chemotherapy: Secondary | ICD-10-CM | POA: Diagnosis not present

## 2020-12-10 ENCOUNTER — Telehealth: Payer: Self-pay | Admitting: Internal Medicine

## 2020-12-10 NOTE — Telephone Encounter (Signed)
Sch per 9/7 los, unable to leave message wil mail calendar.

## 2020-12-27 ENCOUNTER — Ambulatory Visit
Admission: RE | Admit: 2020-12-27 | Discharge: 2020-12-27 | Disposition: A | Discharge status: Home / Self Care | Payer: Medicare Other | Source: Ambulatory Visit | Attending: Internal Medicine | Admitting: Internal Medicine

## 2020-12-27 ENCOUNTER — Other Ambulatory Visit: Payer: Self-pay

## 2020-12-27 DIAGNOSIS — C711 Malignant neoplasm of frontal lobe: Secondary | ICD-10-CM

## 2020-12-27 DIAGNOSIS — G319 Degenerative disease of nervous system, unspecified: Secondary | ICD-10-CM | POA: Diagnosis not present

## 2020-12-27 DIAGNOSIS — C719 Malignant neoplasm of brain, unspecified: Secondary | ICD-10-CM | POA: Diagnosis not present

## 2020-12-27 DIAGNOSIS — I639 Cerebral infarction, unspecified: Secondary | ICD-10-CM | POA: Diagnosis not present

## 2020-12-27 DIAGNOSIS — G9389 Other specified disorders of brain: Secondary | ICD-10-CM | POA: Diagnosis not present

## 2020-12-27 MED ORDER — GADOBENATE DIMEGLUMINE 529 MG/ML IV SOLN
15.0000 mL | Freq: Once | INTRAVENOUS | Status: AC | PRN
  Administered 2020-12-27: 15 mL via INTRAVENOUS

## 2020-12-30 ENCOUNTER — Other Ambulatory Visit: Payer: Self-pay

## 2020-12-30 ENCOUNTER — Inpatient Hospital Stay: Payer: Medicare Other | Attending: Internal Medicine | Admitting: Internal Medicine

## 2020-12-30 ENCOUNTER — Other Ambulatory Visit: Payer: Self-pay | Admitting: Radiation Therapy

## 2020-12-30 ENCOUNTER — Inpatient Hospital Stay: Payer: Medicare Other

## 2020-12-30 VITALS — BP 140/81 | HR 87 | Temp 97.7°F | Resp 18 | Wt 149.3 lb

## 2020-12-30 DIAGNOSIS — Z79899 Other long term (current) drug therapy: Secondary | ICD-10-CM | POA: Insufficient documentation

## 2020-12-30 DIAGNOSIS — C711 Malignant neoplasm of frontal lobe: Secondary | ICD-10-CM | POA: Insufficient documentation

## 2020-12-30 DIAGNOSIS — Z87891 Personal history of nicotine dependence: Secondary | ICD-10-CM | POA: Diagnosis not present

## 2020-12-30 DIAGNOSIS — Z9221 Personal history of antineoplastic chemotherapy: Secondary | ICD-10-CM | POA: Diagnosis not present

## 2020-12-30 DIAGNOSIS — E079 Disorder of thyroid, unspecified: Secondary | ICD-10-CM | POA: Diagnosis not present

## 2020-12-30 NOTE — Progress Notes (Signed)
[facility] [place], [place] [address] [phone]   Interval Evaluation  Date of Service: 12/30/20 Patient Name: Bryan [name] Patient MRN: [id number] Patient DOB: [birth year] Provider: Ventura Sellers, MD  Identifying Statement:  Bryan [name] is a 80 y.o. male with right frontal glioblastoma   Oncologic History: Oncology History   Glioblastoma, IDH-wildtype ([place])   03/07/2018 Surgery   Craniotomy, resection by Dr. Tommi Rumps ([facility]).  Path is Glioblastoma   03/29/2018 - 03/29/2018 Chemotherapy   The patient had temozolomide (TEMODAR) 140 MG capsule, 140 mg (100 % of original dose 140 mg), Oral, Daily, 1 of 1 cycle, Start date: 03/29/2018, End date: 05/10/2018 Dose modification: 140 mg (original dose 140 mg, Cycle 1)   for chemotherapy treatment.     04/04/2018 - 05/13/2018 Radiation Therapy   IMRT and concurrent Temozolomide with Dr. Tammi Klippel   06/09/2018 - 01/02/2019 Chemotherapy   The patient completed 6 cycles of adjuvant 5-day Temozolomide     05/12/2020 Progression   Progression of disease lateral and inferior to resection cavity   05/12/2020 -  Chemotherapy   Resumes 5-day Temozolomide         Biomarkers:  MGMT Unknown.  IDH 1/2 Wild type.  EGFR Expressed  TERT Unknown   Interval History: Bryan [name] presents today for follow up after MRI brain.  No signficant changes from last visit.  Continues to have poor short term memory, language impairments. Left sided weakness remains static. Speaking/fluency is otherwise stable from prior.  He is not independent with any ADLs at this time.  History is overall still limited by language deficits, wife assisted again with history.    H+P (02/16/18) Bryan [name] 80 y.o. male presented after family noted drooping of the left side of his face yesterday.  In addition, he/they describe some cognitive changes, such as difficulty "finding words" in certain situations.  The  patient also feels like his left leg is "lagging" a little behind his right lately.  He is walking on his own, however, and with no falls.  Denies headaches, seizures.   Medications: Current Outpatient Medications on File Prior to Visit  Medication Sig Dispense Refill   acetaminophen (TYLENOL) 325 MG tablet Take 2 tablets (650 mg total) by mouth every 6 (six) hours as needed for mild pain.     cetirizine-pseudoephedrine (ZYRTEC-D) 5-120 MG tablet Take 1 tablet by mouth 2 (two) times daily.     Cholecalciferol (D-3-5) 125 MCG (5000 UT) capsule Take 5,000 Units by mouth daily. Pt's wife states he is taking 1000 units daily     Cyanocobalamin (B-12) 1000 MCG LOZG Take 2 lozenges by mouth daily.     dexamethasone (DECADRON) 1 MG tablet TAKE 1 TABLET EVERY DAY 90 tablet 1   fluticasone (FLONASE) 50 MCG/ACT nasal spray Place 1 spray into both nostrils 2 (two) times daily.     levETIRAcetam (KEPPRA) 100 MG/ML solution TAKE 5 ML (500 MG TOTAL) BY MOUTH TWO  TIMES DAILY. 900 mL 1   levothyroxine (SYNTHROID, LEVOTHROID) 75 MCG tablet Take 1 tablet (75 mcg total) by mouth daily before breakfast. 30 tablet 0   ondansetron (ZOFRAN) 8 MG tablet TAKE 1 TABLET BY MOUTH 2 TIMES DAILY FOR NAUSEA/VOMITING. MAY TAKE 30-60 MINUTES PRIOR TO TEMODAR IF NAUSEA/VOMITING OCCURS. 30 tablet 1   temozolomide (TEMODAR) 100 MG capsule Take 1 capsule (100 mg total) by mouth daily. 5 capsule 0   temozolomide (TEMODAR) 140 MG capsule Take 1  capsule (140 mg total) by mouth daily. 5 capsule 0   zonisamide (ZONEGRAN) 100 MG capsule TAKE 1 CAPSULE EVERY DAY 90 capsule 3   No current facility-administered medications on file prior to visit.    Allergies:  Allergies  Allergen Reactions   Other     Cats, pollen   Past Medical History:  Past Medical History:  Diagnosis Date   Arthritis    Brain cancer ([place])    Glioblastoma   Diabetes mellitus without complication (HCC)    GERD (gastroesophageal reflux disease)     Hypercholesterolemia    Hypertension    Thyroid disease    Past Surgical History:  Past Surgical History:  Procedure Laterality Date   CRANIOTOMY     HERNIA REPAIR     squamous cell skin 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     Social History:  Social History   Socioeconomic History   Marital status: Married    Spouse name: Not on file   Number of children: Not on file   Years of education: Not on file   Highest education level: Not on file  Occupational History   Not on file  Tobacco Use   Smoking status: Former    Packs/day: 0.25    Years: 3.00    Pack years: 0.75    Types: Cigarettes    Quit date: 03/23/1961    Years since quitting: 59.8   Smokeless tobacco: Never  Vaping Use   Vaping Use: Never used  Substance and Sexual Activity   Alcohol use: Yes    Alcohol/week: 1.0 standard drink    Types: 1 Glasses of wine per week    Comment: weekly   Drug use: Never   Sexual activity: Not Currently  Other Topics Concern   Not on file  Social History Narrative   Married. Resides in [place].   Social Determinants of Health   Financial Resource Strain: Not on file  Food Insecurity: Not on file  Transportation Needs: Not on file  Physical Activity: Not on file  Stress: Not on file  Social Connections: Not on file  Intimate Partner Violence: Not on file   Family History:  Family History  Problem Relation Age of Onset   Diabetes Mellitus II Mother    ALS Father     Review of Systems: Constitutional: Denies fevers, chills or abnormal weight loss Eyes: Denies blurriness of vision Ears, nose, mouth, throat, and face: Denies mucositis or sore throat Respiratory: Denies cough, dyspnea or wheezes Cardiovascular: Denies palpitation, chest discomfort or lower extremity swelling Gastrointestinal:  Denies nausea, constipation, diarrhea GU: Denies dysuria or incontinence Skin: Denies abnormal skin rashes Neurological: Per HPI Musculoskeletal: Denies joint  pain, back or neck discomfort. No decrease in ROM Behavioral/Psych: Denies anxiety, disturbance in thought content, and mood instability  Physical Exam: Vitals:   12/30/20 1105  BP: 140/81  Pulse: 87  Resp: 18  Temp: 97.7 F (36.5 C)  SpO2: 99%   KPS: 60. General: Alert, cooperative, pleasant, in no acute distress Head: Craniotomy scar noted, dry and intact. EENT: No conjunctival injection or scleral icterus. Oral mucosa moist Lungs: Resp effort normal Cardiac: Regular rate and rhythm Abdomen: Soft, non-distended abdomen Skin: No rashes cyanosis or petechiae. Extremities: left lower leg warm, erythematous, edema is +3  Neurologic Exam: Mental Status: Awake, alert, attentive to examiner. Oriented to self and environment. Language is fairly densely impaired with regards to fluency, comprehension and repetition.   Cranial Nerves: Visual acuity is  grossly normal. Visual fields are full. Extra-ocular movements intact. No ptosis. L UMN facial paresis, tongue midline. Motor: Tone and bulk are normal. Power is 3/5 in left arm and 2/5 in leg, with absent fine motor function in left hand.  Left thenar atrophy noted. Reflexes are symmetric, no pathologic reflexes present. Intact finger to nose bilaterally Sensory: Intact to light touch and temperature Gait: Deferred  Labs: I have reviewed the data as listed    Component Value Date/Time   NA 139 11/26/2020 1130   K 3.8 11/26/2020 1130   CL 109 11/26/2020 1130   CO2 22 11/26/2020 1130   GLUCOSE 177 (H) 11/26/2020 1130   BUN 20 11/26/2020 1130   CREATININE 1.12 11/26/2020 1130   CALCIUM 8.9 11/26/2020 1130   PROT 6.0 (L) 11/26/2020 1130   ALBUMIN 2.9 (L) 11/26/2020 1130   AST 9 (L) 11/26/2020 1130   ALT 16 11/26/2020 1130   ALKPHOS 55 11/26/2020 1130   BILITOT <0.2 (L) 11/26/2020 1130   GFRNONAA >60 11/26/2020 1130   GFRAA >60 03/06/2019 1026   Lab Results  Component Value Date   WBC 6.5 11/26/2020   NEUTROABS 4.9 11/26/2020    HGB 10.5 (L) 11/26/2020   HCT 30.8 (L) 11/26/2020   MCV 96.3 11/26/2020   PLT 186 11/26/2020   Imaging:  [facility] Clinician Interpretation: I have personally reviewed the CNS images as listed.  My interpretation, in the context of the patient's clinical presentation, is treatment effect vs true progression  MR BRAIN W WO CONTRAST  Result Date: 12/28/2020 CLINICAL DATA:  Brain/CNS neoplasm, assess treatment response. Right frontal glioblastoma. EXAM: MRI HEAD WITHOUT AND WITH CONTRAST TECHNIQUE: Multiplanar, multiecho pulse sequences of the brain and surrounding structures were obtained without and with intravenous contrast. CONTRAST:  34m MULTIHANCE GADOBENATE DIMEGLUMINE 529 MG/ML IV SOLN COMPARISON:  11/08/2020 FINDINGS: Multiple sequences are moderately motion degraded despite repeat imaging attempts, particularly postcontrast sequences. Brain: Postoperative changes are again noted in the right opercular region with chronic blood products, encephalomalacia, and an unchanged 2.7 cm irregularly peripherally enhancing lesion at the level of the insula (series 22, image 14). Partly nodular and partly curvilinear enhancement adjacent to the superior aspect of the body of the right lateral ventricle has mildly progressed (series 22, image 18; series 24, image 14; series 13, image 11). Extensive nonenhancing T2 hyperintensity in the right cerebral hemispheric white matter is unchanged and without significant associated mass effect. There is ex vacuo dilatation of the right lateral ventricle, and wallerian degeneration is noted in the brainstem on the right. No acute infarct, midline shift, or extra-axial fluid collection is evident. Small T2 hyperintensities elsewhere in the cerebral white matter bilaterally are unchanged and nonspecific but compatible with mild-to-moderate chronic small vessel ischemic disease. 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 There is moderate cerebral atrophy.  Vascular: Major intracranial vascular flow voids are preserved. Skull and upper cervical spine: Right-sided craniotomy. No suspicious marrow lesion. Sinuses/Orbits: Unremarkable orbits. Chronic complete opacification of the left maxillary sinus. Clear mastoid air cells. Other: None. IMPRESSION: 1. Motion degraded examination. 2. Mildly progressive enhancement adjacent to the body of the right lateral ventricle. 3. Unchanged enhancing lesion at the right insula and unchanged extensive right cerebral hemispheric white matter T2 signal abnormality. Electronically Signed   By: ALogan BoresM.D.   On: 12/28/2020 11:06    Assessment/Plan 1.  Glioblastoma, IDH-wildtype ([facility]   Mr. MFettyis clinically stable today from neurologic standpoint.  No new or progressive deficits.  MRI brain continues to demonstrate enhancement along the border of the right lateral ventricle.  This is very modestly progressed compared to prior study. Etiology is either progression of disease, or alternate disruption of blood-brain barrier.  As prior, rest of bulky tumor and FLAIR signal remains stable.  Decadron may continue at 28m daily.  He should con't Keppra 10081mBID and con't Zonisamide 10062mID for seizure prevention.  He is agreeable with this plan.  We ask that EdwGreyson Peavyturn to clinic in 2-3 months with MRI brain for evaluation.  We appreciate the opportunity to participate in the care of EdwBerton BonWe will follow up with him soon after family discussion.  I have spent a total of 40 minutes of face-to-face and non-face-to-face time, excluding clinical staff time, preparing to see patient, ordering tests and/or medications, counseling the patient, and independently interpreting results and communicating results to the patient/family/caregiver    ZacVentura SellersD Medical Director of Neuro-Oncology [facility] [place]/10/22 12:15 PM

## 2020-12-31 ENCOUNTER — Telehealth: Payer: Self-pay | Admitting: Internal Medicine

## 2020-12-31 ENCOUNTER — Other Ambulatory Visit (HOSPITAL_COMMUNITY): Payer: Self-pay

## 2020-12-31 NOTE — Telephone Encounter (Signed)
Scheduled appt per 10/10 lost. Pt's wife is aware.

## 2021-02-23 ENCOUNTER — Other Ambulatory Visit: Payer: Self-pay | Admitting: Internal Medicine

## 2021-02-24 ENCOUNTER — Encounter: Payer: Self-pay | Admitting: Internal Medicine

## 2021-03-04 ENCOUNTER — Encounter (HOSPITAL_COMMUNITY): Payer: Self-pay

## 2021-03-04 ENCOUNTER — Emergency Department (HOSPITAL_COMMUNITY): Payer: Medicare Other

## 2021-03-04 ENCOUNTER — Emergency Department (HOSPITAL_COMMUNITY)
Admission: EM | Admit: 2021-03-04 | Discharge: 2021-03-05 | Disposition: A | Discharge status: Home / Self Care | Payer: Medicare Other | Attending: Student | Admitting: Student

## 2021-03-04 ENCOUNTER — Other Ambulatory Visit: Payer: Self-pay

## 2021-03-04 DIAGNOSIS — I10 Essential (primary) hypertension: Secondary | ICD-10-CM | POA: Diagnosis not present

## 2021-03-04 DIAGNOSIS — Z87891 Personal history of nicotine dependence: Secondary | ICD-10-CM | POA: Insufficient documentation

## 2021-03-04 DIAGNOSIS — S42202A Unspecified fracture of upper end of left humerus, initial encounter for closed fracture: Secondary | ICD-10-CM | POA: Diagnosis not present

## 2021-03-04 DIAGNOSIS — S42342A Displaced spiral fracture of shaft of humerus, left arm, initial encounter for closed fracture: Secondary | ICD-10-CM | POA: Diagnosis not present

## 2021-03-04 DIAGNOSIS — S42292A Other displaced fracture of upper end of left humerus, initial encounter for closed fracture: Secondary | ICD-10-CM | POA: Insufficient documentation

## 2021-03-04 DIAGNOSIS — Z79899 Other long term (current) drug therapy: Secondary | ICD-10-CM | POA: Insufficient documentation

## 2021-03-04 DIAGNOSIS — E119 Type 2 diabetes mellitus without complications: Secondary | ICD-10-CM | POA: Diagnosis not present

## 2021-03-04 DIAGNOSIS — S42352A Displaced comminuted fracture of shaft of humerus, left arm, initial encounter for closed fracture: Secondary | ICD-10-CM | POA: Diagnosis not present

## 2021-03-04 DIAGNOSIS — Z85841 Personal history of malignant neoplasm of brain: Secondary | ICD-10-CM | POA: Diagnosis not present

## 2021-03-04 DIAGNOSIS — W19XXXA Unspecified fall, initial encounter: Secondary | ICD-10-CM | POA: Diagnosis not present

## 2021-03-04 DIAGNOSIS — S4992XA Unspecified injury of left shoulder and upper arm, initial encounter: Secondary | ICD-10-CM | POA: Diagnosis not present

## 2021-03-04 DIAGNOSIS — G936 Cerebral edema: Secondary | ICD-10-CM | POA: Diagnosis not present

## 2021-03-04 DIAGNOSIS — D72829 Elevated white blood cell count, unspecified: Secondary | ICD-10-CM | POA: Diagnosis not present

## 2021-03-04 DIAGNOSIS — S299XXA Unspecified injury of thorax, initial encounter: Secondary | ICD-10-CM | POA: Diagnosis not present

## 2021-03-04 DIAGNOSIS — J320 Chronic maxillary sinusitis: Secondary | ICD-10-CM | POA: Diagnosis not present

## 2021-03-04 DIAGNOSIS — G9389 Other specified disorders of brain: Secondary | ICD-10-CM | POA: Diagnosis not present

## 2021-03-04 DIAGNOSIS — E876 Hypokalemia: Secondary | ICD-10-CM | POA: Insufficient documentation

## 2021-03-04 LAB — COMPREHENSIVE METABOLIC PANEL
ALT: 23 U/L (ref 0–44)
AST: 17 U/L (ref 15–41)
Albumin: 3.8 g/dL (ref 3.5–5.0)
Alkaline Phosphatase: 58 U/L (ref 38–126)
Anion gap: 7 (ref 5–15)
BUN: 21 mg/dL (ref 8–23)
CO2: 26 mmol/L (ref 22–32)
Calcium: 8.9 mg/dL (ref 8.9–10.3)
Chloride: 104 mmol/L (ref 98–111)
Creatinine, Ser: 0.97 mg/dL (ref 0.61–1.24)
GFR, Estimated: >60 mL/min (ref >60)
Glucose, Bld: 167 mg/dL — ABNORMAL HIGH (ref 70–99)
Potassium: 3.3 mmol/L — ABNORMAL LOW (ref 3.5–5.1)
Sodium: 137 mmol/L (ref 135–145)
Total Bilirubin: 0.5 mg/dL (ref 0.3–1.2)
Total Protein: 6.6 g/dL (ref 6.5–8.1)

## 2021-03-04 LAB — CBC WITH DIFFERENTIAL/PLATELET
Abs Immature Granulocytes: 0.09 10*3/uL — ABNORMAL HIGH (ref 0.00–0.07)
Basophils Absolute: 0 10*3/uL (ref 0.0–0.1)
Basophils Relative: 0 %
Eosinophils Absolute: 0 10*3/uL (ref 0.0–0.5)
Eosinophils Relative: 0 %
HCT: 35.6 % — ABNORMAL LOW (ref 39.0–52.0)
Hemoglobin: 12.2 g/dL — ABNORMAL LOW (ref 13.0–17.0)
Immature Granulocytes: 1 %
Lymphocytes Relative: 7 %
Lymphs Abs: 0.8 10*3/uL (ref 0.7–4.0)
MCH: 33.2 pg (ref 26.0–34.0)
MCHC: 34.3 g/dL (ref 30.0–36.0)
MCV: 96.7 fL (ref 80.0–100.0)
Monocytes Absolute: 1 10*3/uL (ref 0.1–1.0)
Monocytes Relative: 9 %
Neutro Abs: 9.4 10*3/uL — ABNORMAL HIGH (ref 1.7–7.7)
Neutrophils Relative %: 83 %
Platelets: 247 10*3/uL (ref 150–400)
RBC: 3.68 MIL/uL — ABNORMAL LOW (ref 4.22–5.81)
RDW: 12.7 % (ref 11.5–15.5)
WBC: 11.4 10*3/uL — ABNORMAL HIGH (ref 4.0–10.5)
nRBC: 0 % (ref 0.0–0.2)

## 2021-03-04 MED ORDER — IBUPROFEN 600 MG PO TABS: 600.0000 mg | ORAL_TABLET | Freq: Three times a day (TID) | ORAL | 0 refills | Status: AC

## 2021-03-04 MED ORDER — HYDROCODONE-ACETAMINOPHEN 5-325 MG PO TABS: 2.0000 | ORAL_TABLET | ORAL | 0 refills | Status: DC | PRN

## 2021-03-04 MED ORDER — HYDROCODONE-ACETAMINOPHEN 5-325 MG PO TABS
1.0000 | ORAL_TABLET | Freq: Once | ORAL | Status: AC
  Administered 2021-03-04: 1 via ORAL
  Filled 2021-03-04: qty 1

## 2021-03-04 MED ORDER — IBUPROFEN 600 MG PO TABS: 600.0000 mg | ORAL_TABLET | Freq: Three times a day (TID) | ORAL | 0 refills | Status: DC

## 2021-03-04 NOTE — ED Triage Notes (Signed)
Patient 's wife reports that the patient had a brain tumor removed and since the surgery the patient has frequent falls due to left sided weakness. Patient's wife reports that the weakness is worsening. Patient uses a walker and falls a lot according to the wife.  Patient denies hitting his head, but does c/o left shoulder pain.

## 2021-03-04 NOTE — Progress Notes (Signed)
.  Transition of Care [facility]) - Emergency Department Mini Assessment   Patient Details  Name: Bryan [name] MRN: [id number] Date of Birth: [birth year]  Transition of Care [facility]) CM/SW Contact:    Illene Regulus, LCSW Phone Number: 03/04/2021, 5:30 PM   Clinical Narrative: [facility] CSW received consult to discuss hospice services. CSW spoke with pt's wife Jermario Kalmar at bed side, CSW explained to pt's wife the process of obtain hospice services. CSW offered choice , pt's wife chose [facility]. CSW contacted [facility] for referral for hospice services.  Pt's wife denies any needs for DME upon d/c.    ED Mini Assessment: What brought you to the Emergency Department? : falls  Barriers to Discharge: No Barriers Identified  Barrier interventions: hospice referral  Means of departure: Car  Interventions which prevented an admission or readmission: Home Health Consult or Services    Patient Contact and Communications        ,                 Admission diagnosis:  fall Patient Active Problem List   Diagnosis Date Noted   Expressive aphasia    Glioblastoma multiforme of brain ([place])    Brain neoplasm ([place]) 03/28/2018    Glioblastoma, IDH-wildtype ([place])  03/28/2018   Acute blood loss anemia    Seizures ([place])    Hypoalbuminemia due to protein-calorie malnutrition ([place])    Hyponatremia    Steroid-induced hyperglycemia    Brain tumor ([place]) 03/11/2018   Postoperative pain    Seizure prophylaxis    Prediabetes    Dyslipidemia    S/P craniotomy 03/08/2018   Brain mass 02/16/2018   Hypertension    Type 2 diabetes mellitus without complication, without long-term current use of insulin ([place]) 01/16/2015   Hyperlipidemia 04/11/2014   Inguinal hernia 08/10/2013   PCP:  Reynold Bowen, MD Pharmacy:   [place], [address] [place] [address] Phone: [phone] Fax:  [phone] PHARMACY [id number] [place], [place] [address] [place] [address] Phone: [phone] Fax: [phone]  [place] [address] Phone: [phone] Fax: [phone]

## 2021-03-04 NOTE — ED Provider Notes (Signed)
°  Physical Exam  BP (!) 145/63    Pulse 84    Temp (!) 97.3 F (36.3 C) (Oral)    Resp 14    Ht 5\' 4"  (1.626 m)    Wt 70.3 kg    SpO2 96%    BMI 26.61 kg/m   Physical Exam  ED Course/Procedures     Procedures  MDM   Patient has history of GBM and had a traumatic incident leading to humeral fracture. Patient is stable for discharge from trauma perspective.  Wife needs more resources.  Allegedly spoke with Dr. Mickeal Skinner, and the result of that conversation was to consult social work to start enrollment in hospice.  I paid social work.  It appears that patient has been enrolled by [facility] care.  Palliative medicine also called me, but said they would not be able to add any further value.  Patient is stable for discharge.       Varney Biles, MD 03/04/21 1743

## 2021-03-04 NOTE — ED Notes (Signed)
Pt back from imaging at this time.

## 2021-03-04 NOTE — ED Notes (Signed)
EDP at the bedside to evaluate. Pt oriented to person alone. Attached to cardiac monitor x3. VSS.

## 2021-03-04 NOTE — ED Notes (Signed)
Social Work at the bedside.

## 2021-03-04 NOTE — ED Notes (Signed)
Pt to CT at this time.

## 2021-03-04 NOTE — ED Notes (Addendum)
Charted in error.

## 2021-03-04 NOTE — ED Provider Notes (Signed)
[facility] DEPT Provider Note   CSN: [id number] Arrival date & time: 03/04/21  1029     History Chief Complaint  Patient presents with   Bryan [name] is a 80 y.o. male with PMH glioblastoma status postresection and associated left-sided weakness, T2DM, HLD, HTN, frequent falls who presents emergency department for evaluation of a fall and left shoulder pain.  Patient's wife states that he has frequent falls, but this fall is associated with more pain than usual.  He has decree sensation on that side and normally would not feel pain but he does arrive with abnormal bruising near the elbow and pain in the humerus.  Denies head strike, loss of consciousness, nausea, vomiting or other systemic symptoms.   Fall Pertinent negatives include no chest pain, no abdominal pain and no shortness of breath.      Past Medical History:  Diagnosis Date   Arthritis    Brain cancer ([place])    Glioblastoma   Diabetes mellitus without complication ([place])    GERD (gastroesophageal reflux disease)    Hypercholesterolemia    Hypertension    Thyroid disease     Patient Active Problem List   Diagnosis Date Noted   Expressive aphasia    Glioblastoma multiforme of brain ([place])    Brain neoplasm ([place]) 03/28/2018    Glioblastoma, IDH-wildtype ([place])  03/28/2018   Acute blood loss anemia    Seizures (HCC)    Hypoalbuminemia due to protein-calorie malnutrition (HCC)    Hyponatremia    Steroid-induced hyperglycemia    Brain tumor ([place]) 03/11/2018   Postoperative pain    Seizure prophylaxis    Prediabetes    Dyslipidemia    S/P craniotomy 03/08/2018   Brain mass 02/16/2018   Hypertension    Type 2 diabetes mellitus without complication, without long-term current use of insulin ([place]) 01/16/2015   Hyperlipidemia 04/11/2014   Inguinal hernia 08/10/2013    Past Surgical History:  Procedure Laterality Date   CRANIOTOMY     HERNIA REPAIR     squamous cell  skin 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         Family History  Problem Relation Age of Onset   Diabetes Mellitus II Mother    ALS Father     Social History   Tobacco Use   Smoking status: Former    Packs/day: 0.25    Years: 3.00    Pack years: 0.75    Types: Cigarettes    Quit date: 03/23/1961    Years since quitting: 59.9   Smokeless tobacco: Never  Vaping Use   Vaping Use: Never used  Substance Use Topics   Alcohol use: Not Currently    Alcohol/week: 1.0 standard drink    Types: 1 Glasses of wine per week    Comment: weekly   Drug use: Never    Home Medications Prior to Admission medications   Medication Sig Start Date End Date Taking? Authorizing Provider  HYDROcodone-acetaminophen (NORCO/VICODIN) 5-325 MG tablet Take 2 tablets by mouth every 4 (four) hours as needed. 03/04/21  Yes Santiaga Butzin, MD  ibuprofen (ADVIL) 600 MG tablet Take 1 tablet (600 mg total) by mouth every 8 (eight) hours. 03/04/21 04/03/21 Yes Viney Acocella, MD  acetaminophen (TYLENOL) 325 MG tablet Take 2 tablets (650 mg total) by mouth every 6 (six) hours as needed for mild pain. 04/05/18   Angiulli, Lavon Paganini, PA-C  cetirizine-pseudoephedrine (ZYRTEC-D) 5-120 MG tablet Take 1 tablet by  mouth 2 (two) times daily. 05/03/19   [provider]  Cholecalciferol (D-3-5) 125 MCG (5000 UT) capsule Take 5,000 Units by mouth daily. Pt's wife states he is taking 1000 units daily 12/12/18   [provider]  Cyanocobalamin (B-12) 1000 MCG LOZG Take 2 lozenges by mouth daily. 12/13/18   [provider]  dexamethasone (DECADRON) 1 MG tablet TAKE 1 TABLET EVERY DAY 09/10/20   Vaslow, Acey Lav, MD  fluticasone (FLONASE) 50 MCG/ACT nasal spray Place 1 spray into both nostrils 2 (two) times daily.    [provider]  levETIRAcetam (KEPPRA) 100 MG/ML solution TAKE 5 ML (500 MG TOTAL) BY MOUTH TWO  TIMES DAILY. 09/10/20   Ventura Sellers, MD  levothyroxine (SYNTHROID,  LEVOTHROID) 75 MCG tablet Take 1 tablet (75 mcg total) by mouth daily before breakfast. 04/05/18   Angiulli, Lavon Paganini, PA-C  ondansetron (ZOFRAN) 8 MG tablet TAKE 1 TABLET BY MOUTH 2 TIMES DAILY FOR NAUSEA/VOMITING. MAY TAKE 30-60 MINUTES PRIOR TO TEMODAR IF NAUSEA/VOMITING OCCURS. 05/17/20 05/17/21  Ventura Sellers, MD  temozolomide (TEMODAR) 100 MG capsule Take 1 capsule (100 mg total) by mouth daily. 10/14/20   Ventura Sellers, MD  temozolomide (TEMODAR) 140 MG capsule Take 1 capsule (140 mg total) by mouth daily. 10/14/20   Vaslow, Acey Lav, MD  zonisamide (ZONEGRAN) 100 MG capsule TAKE 1 CAPSULE (100 MG TOTAL) BY MOUTH DAILY. 02/24/21   Ventura Sellers, MD    Allergies    Other  Review of Systems   Review of Systems  Constitutional:  Negative for chills and fever.  HENT:  Negative for ear pain and sore throat.   Eyes:  Negative for pain and visual disturbance.  Respiratory:  Negative for cough and shortness of breath.   Cardiovascular:  Negative for chest pain and palpitations.  Gastrointestinal:  Negative for abdominal pain and vomiting.  Genitourinary:  Negative for dysuria and hematuria.  Musculoskeletal:  Positive for arthralgias and myalgias. Negative for back pain.  Skin:  Negative for color change and rash.  Neurological:  Negative for seizures and syncope.  All other systems reviewed and are negative.  Physical Exam Updated Vital Signs BP (!) 151/100 (BP Location: Right Arm)    Pulse (!) 106    Temp (!) 97.3 F (36.3 C) (Oral)    Resp (!) 24    Ht $R'5\' 4"'uZ$  (1.626 m)    Wt 70.3 kg    SpO2 100%    BMI 26.61 kg/m   Physical Exam Constitutional:      General: He is not in acute distress.    Appearance: Normal appearance.  HENT:     Head: Normocephalic and atraumatic.     Nose: No congestion or rhinorrhea.  Eyes:     General:        Right eye: No discharge.        Left eye: No discharge.     Extraocular Movements: Extraocular movements intact.     Pupils: Pupils are  equal, round, and reactive to light.  Cardiovascular:     Rate and Rhythm: Normal rate and regular rhythm.     Heart sounds: No murmur heard. Pulmonary:     Effort: No respiratory distress.     Breath sounds: No wheezing or rales.  Abdominal:     General: There is no distension.     Tenderness: There is no abdominal tenderness.  Musculoskeletal:        General: Swelling, tenderness (Left humerus) and  signs of injury present. Normal range of motion.     Cervical back: Normal range of motion.  Skin:    General: Skin is warm and dry.  Neurological:     General: No focal deficit present.     Mental Status: He is alert.    ED Results / Procedures / Treatments   Labs (all labs ordered are listed, but only abnormal results are displayed) Labs Reviewed  COMPREHENSIVE METABOLIC PANEL - Abnormal; Notable for the following components:      Result Value   Potassium 3.3 (*)    Glucose, Bld 167 (*)    All other components within normal limits  CBC WITH DIFFERENTIAL/PLATELET - Abnormal; Notable for the following components:   WBC 11.4 (*)    RBC 3.68 (*)    Hemoglobin 12.2 (*)    HCT 35.6 (*)    Neutro Abs 9.4 (*)    Abs Immature Granulocytes 0.09 (*)    All other components within normal limits    EKG None  Radiology DG Chest 2 View  Result Date: 03/04/2021 CLINICAL DATA:  Fall.  Left shoulder injury. EXAM: CHEST - 2 VIEW COMPARISON:  Chest CT 02/16/2018. FINDINGS: Positioning limited by the patient's left upper arm injury. Fracture of the proximal left humerus is partially imaged on this examination. The heart size and mediastinal contours are stable. 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 The lungs appear clear. No acute fractures are seen within the chest. There is no pleural effusion or pneumothorax. IMPRESSION: Proximal left humeral fracture. No evidence of acute chest injury or active cardiopulmonary process. Electronically  Signed   By: Richardean Sale M.D.   On: 03/04/2021 13:38   DG Elbow Complete Left  Result Date: 03/04/2021 CLINICAL DATA:  Fall.  Left shoulder injury. EXAM: LEFT SHOULDER - 2+ VIEW; LEFT HUMERUS - 2+ VIEW; LEFT ELBOW - COMPLETE 3+ VIEW COMPARISON:  Left shoulder radiographs 04/14/2018. FINDINGS: Positioning is limited by the patient's injury. There is no true lateral view of the elbow. There is a comminuted, angulated and displaced spiral fracture of the proximal humeral diaphysis. This demonstrates up to 4.2 cm of displacement distally. No intra-articular extension of the fracture is seen to the shoulder or elbow. There are mild underlying glenohumeral and acromioclavicular degenerative changes. No significant abnormality identified at the elbow. IMPRESSION: Angulated and displaced extra-articular spiral fracture of the proximal left humeral diaphysis. Electronically Signed   By: Richardean Sale M.D.   On: 03/04/2021 13:36   CT Head Wo Contrast  Result Date: 03/04/2021 CLINICAL DATA:  [place].  History of glioblastoma. EXAM: CT HEAD WITHOUT CONTRAST TECHNIQUE: Contiguous axial images were obtained from the base of the skull through the vertex without intravenous contrast. COMPARISON:  Numerous prior brain MRIs and prior head CT 10/05/2018 FINDINGS: Brain: Stable surgical changes from a right sided craniotomy. Stable large area of tumoral edema involving the right cerebral hemisphere. Progressive central calcifications. No CT findings for new/acute intracranial process such as intracranial hemorrhage or extra-axial fluid collection. The ventricles are in the midline without mass effect or shift. Stable ex vacuo dilatation of the temporal horn of the right lateral ventricle. Vascular: Stable vascular calcifications.  No hyperdense vessels. Skull: Surgical changes from right craniotomy. No acute skull fracture. Sinuses/Orbits: Chronic left maxillary sinus disease. The other paranasal sinuses and mastoid air  cells are clear. The globes are intact. Other: No scalp lesions or scalp hematoma. IMPRESSION: 1. Stable surgical changes from a  right sided craniotomy. 2. Stable large area of tumoral edema involving the right cerebral hemisphere. Progressive central calcifications. 3. No CT findings for new/acute intracranial process such as intracranial hemorrhage or extra-axial fluid collection. Electronically Signed   By: Marijo Sanes M.D.   On: 03/04/2021 12:50   DG Shoulder Left  Result Date: 03/04/2021 CLINICAL DATA:  Fall.  Left shoulder injury. EXAM: LEFT SHOULDER - 2+ VIEW; LEFT HUMERUS - 2+ VIEW; LEFT ELBOW - COMPLETE 3+ VIEW COMPARISON:  Left shoulder radiographs 04/14/2018. FINDINGS: Positioning is limited by the patient's injury. There is no true lateral view of the elbow. There is a comminuted, angulated and displaced spiral fracture of the proximal humeral diaphysis. This demonstrates up to 4.2 cm of displacement distally. No intra-articular extension of the fracture is seen to the shoulder or elbow. There are mild underlying glenohumeral and acromioclavicular degenerative changes. No significant abnormality identified at the elbow. IMPRESSION: Angulated and displaced extra-articular spiral fracture of the proximal left humeral diaphysis. Electronically Signed   By: Richardean Sale M.D.   On: 03/04/2021 13:36   DG Humerus Left  Result Date: 03/04/2021 CLINICAL DATA:  Fall.  Left shoulder injury. EXAM: LEFT SHOULDER - 2+ VIEW; LEFT HUMERUS - 2+ VIEW; LEFT ELBOW - COMPLETE 3+ VIEW COMPARISON:  Left shoulder radiographs 04/14/2018. FINDINGS: Positioning is limited by the patient's injury. There is no true lateral view of the elbow. There is a comminuted, angulated and displaced spiral fracture of the proximal humeral diaphysis. This demonstrates up to 4.2 cm of displacement distally. No intra-articular extension of the fracture is seen to the shoulder or elbow. There are mild underlying glenohumeral and  acromioclavicular degenerative changes. No significant abnormality identified at the elbow. IMPRESSION: Angulated and displaced extra-articular spiral fracture of the proximal left humeral diaphysis. Electronically Signed   By: Richardean Sale M.D.   On: 03/04/2021 13:36    Procedures Procedures   Medications Ordered in ED Medications  HYDROcodone-acetaminophen (NORCO/VICODIN) 5-325 MG per tablet 1 tablet (has no administration in time range)    ED Course  I have reviewed the triage vital signs and the nursing notes.  Pertinent labs & imaging results that were available during my care of the patient were reviewed by me and considered in my medical decision making (see chart for details).    MDM Rules/Calculators/A&P                           Patient seen emergency department for evaluation of a fall with left upper extremity pain.  Physical exam reveals tenderness over the humerus on the left, bruising pulling around the elbow on the left.  Patient has baseline weakness and numbness of the left upper extremity and this is unchanged today.  Laboratory evaluation with a leukocytosis to 11.4 likely elevated secondary to pain, chemistry with mild hypokalemia to 3.3.  X-ray imaging showing an angulated spiral fracture of the proximal humerus.  Patient placed in a sling and swath and will be discharged with outpatient orthopedic follow-up.  Prescriptions for ibuprofen and Norco sent to the pharmacy. Final Clinical Impression(s) / ED Diagnoses Final diagnoses:  Closed fracture of proximal end of left humerus, unspecified fracture morphology, initial encounter    Rx / DC Orders ED Discharge Orders          Ordered    ibuprofen (ADVIL) 600 MG tablet  Every 8 hours        03/04/21 1342    HYDROcodone-acetaminophen (  NORCO/VICODIN) 5-325 MG tablet  Every 4 hours PRN        03/04/21 1342             Amberli Ruegg, Debe Coder, MD 03/04/21 1421

## 2021-03-05 NOTE — Progress Notes (Signed)
[facility] ED  [facility]) Hospital Liaison Note  Notified by [facility] manager of patient/family request for [facility] palliative services at home after discharge.   [facility] hospital liaison will follow patient for discharge disposition.   Please call with any hospice or outpatient palliative care related questions.   Thank you for the opportunity to participate in this patient's care.   [facility] Liaison [phone]

## 2021-03-06 DIAGNOSIS — S42202D Unspecified fracture of upper end of left humerus, subsequent encounter for fracture with routine healing: Secondary | ICD-10-CM | POA: Diagnosis not present

## 2021-03-06 DIAGNOSIS — E119 Type 2 diabetes mellitus without complications: Secondary | ICD-10-CM | POA: Diagnosis not present

## 2021-03-06 DIAGNOSIS — E039 Hypothyroidism, unspecified: Secondary | ICD-10-CM | POA: Diagnosis not present

## 2021-03-06 DIAGNOSIS — E785 Hyperlipidemia, unspecified: Secondary | ICD-10-CM | POA: Diagnosis not present

## 2021-03-06 DIAGNOSIS — C711 Malignant neoplasm of frontal lobe: Secondary | ICD-10-CM | POA: Diagnosis not present

## 2021-03-06 DIAGNOSIS — I10 Essential (primary) hypertension: Secondary | ICD-10-CM | POA: Diagnosis not present

## 2021-03-06 DIAGNOSIS — K219 Gastro-esophageal reflux disease without esophagitis: Secondary | ICD-10-CM | POA: Diagnosis not present

## 2021-03-06 DIAGNOSIS — R4701 Aphasia: Secondary | ICD-10-CM | POA: Diagnosis not present

## 2021-03-06 DIAGNOSIS — J302 Other seasonal allergic rhinitis: Secondary | ICD-10-CM | POA: Diagnosis not present

## 2021-03-06 DIAGNOSIS — G40909 Epilepsy, unspecified, not intractable, without status epilepticus: Secondary | ICD-10-CM | POA: Diagnosis not present

## 2021-03-07 DIAGNOSIS — C711 Malignant neoplasm of frontal lobe: Secondary | ICD-10-CM | POA: Diagnosis not present

## 2021-03-07 DIAGNOSIS — S42202D Unspecified fracture of upper end of left humerus, subsequent encounter for fracture with routine healing: Secondary | ICD-10-CM | POA: Diagnosis not present

## 2021-03-07 DIAGNOSIS — R4701 Aphasia: Secondary | ICD-10-CM | POA: Diagnosis not present

## 2021-03-07 DIAGNOSIS — E119 Type 2 diabetes mellitus without complications: Secondary | ICD-10-CM | POA: Diagnosis not present

## 2021-03-07 DIAGNOSIS — K219 Gastro-esophageal reflux disease without esophagitis: Secondary | ICD-10-CM | POA: Diagnosis not present

## 2021-03-07 DIAGNOSIS — E785 Hyperlipidemia, unspecified: Secondary | ICD-10-CM | POA: Diagnosis not present

## 2021-03-11 DIAGNOSIS — E785 Hyperlipidemia, unspecified: Secondary | ICD-10-CM | POA: Diagnosis not present

## 2021-03-11 DIAGNOSIS — K219 Gastro-esophageal reflux disease without esophagitis: Secondary | ICD-10-CM | POA: Diagnosis not present

## 2021-03-11 DIAGNOSIS — R4701 Aphasia: Secondary | ICD-10-CM | POA: Diagnosis not present

## 2021-03-11 DIAGNOSIS — E119 Type 2 diabetes mellitus without complications: Secondary | ICD-10-CM | POA: Diagnosis not present

## 2021-03-11 DIAGNOSIS — S42202D Unspecified fracture of upper end of left humerus, subsequent encounter for fracture with routine healing: Secondary | ICD-10-CM | POA: Diagnosis not present

## 2021-03-11 DIAGNOSIS — C711 Malignant neoplasm of frontal lobe: Secondary | ICD-10-CM | POA: Diagnosis not present

## 2021-03-12 DIAGNOSIS — S42202A Unspecified fracture of upper end of left humerus, initial encounter for closed fracture: Secondary | ICD-10-CM | POA: Diagnosis not present

## 2021-03-14 DIAGNOSIS — E785 Hyperlipidemia, unspecified: Secondary | ICD-10-CM | POA: Diagnosis not present

## 2021-03-14 DIAGNOSIS — S42202D Unspecified fracture of upper end of left humerus, subsequent encounter for fracture with routine healing: Secondary | ICD-10-CM | POA: Diagnosis not present

## 2021-03-14 DIAGNOSIS — K219 Gastro-esophageal reflux disease without esophagitis: Secondary | ICD-10-CM | POA: Diagnosis not present

## 2021-03-14 DIAGNOSIS — R4701 Aphasia: Secondary | ICD-10-CM | POA: Diagnosis not present

## 2021-03-14 DIAGNOSIS — E119 Type 2 diabetes mellitus without complications: Secondary | ICD-10-CM | POA: Diagnosis not present

## 2021-03-14 DIAGNOSIS — C711 Malignant neoplasm of frontal lobe: Secondary | ICD-10-CM | POA: Diagnosis not present

## 2021-03-19 DIAGNOSIS — E785 Hyperlipidemia, unspecified: Secondary | ICD-10-CM | POA: Diagnosis not present

## 2021-03-19 DIAGNOSIS — K219 Gastro-esophageal reflux disease without esophagitis: Secondary | ICD-10-CM | POA: Diagnosis not present

## 2021-03-19 DIAGNOSIS — E119 Type 2 diabetes mellitus without complications: Secondary | ICD-10-CM | POA: Diagnosis not present

## 2021-03-19 DIAGNOSIS — C711 Malignant neoplasm of frontal lobe: Secondary | ICD-10-CM | POA: Diagnosis not present

## 2021-03-19 DIAGNOSIS — S42202D Unspecified fracture of upper end of left humerus, subsequent encounter for fracture with routine healing: Secondary | ICD-10-CM | POA: Diagnosis not present

## 2021-03-19 DIAGNOSIS — R4701 Aphasia: Secondary | ICD-10-CM | POA: Diagnosis not present

## 2021-03-21 DIAGNOSIS — E119 Type 2 diabetes mellitus without complications: Secondary | ICD-10-CM | POA: Diagnosis not present

## 2021-03-21 DIAGNOSIS — E785 Hyperlipidemia, unspecified: Secondary | ICD-10-CM | POA: Diagnosis not present

## 2021-03-21 DIAGNOSIS — C711 Malignant neoplasm of frontal lobe: Secondary | ICD-10-CM | POA: Diagnosis not present

## 2021-03-21 DIAGNOSIS — K219 Gastro-esophageal reflux disease without esophagitis: Secondary | ICD-10-CM | POA: Diagnosis not present

## 2021-03-21 DIAGNOSIS — R4701 Aphasia: Secondary | ICD-10-CM | POA: Diagnosis not present

## 2021-03-21 DIAGNOSIS — S42202D Unspecified fracture of upper end of left humerus, subsequent encounter for fracture with routine healing: Secondary | ICD-10-CM | POA: Diagnosis not present

## 2021-03-23 DIAGNOSIS — R4701 Aphasia: Secondary | ICD-10-CM | POA: Diagnosis not present

## 2021-03-23 DIAGNOSIS — I10 Essential (primary) hypertension: Secondary | ICD-10-CM | POA: Diagnosis not present

## 2021-03-23 DIAGNOSIS — J302 Other seasonal allergic rhinitis: Secondary | ICD-10-CM | POA: Diagnosis not present

## 2021-03-23 DIAGNOSIS — C711 Malignant neoplasm of frontal lobe: Secondary | ICD-10-CM | POA: Diagnosis not present

## 2021-03-23 DIAGNOSIS — E785 Hyperlipidemia, unspecified: Secondary | ICD-10-CM | POA: Diagnosis not present

## 2021-03-23 DIAGNOSIS — E039 Hypothyroidism, unspecified: Secondary | ICD-10-CM | POA: Diagnosis not present

## 2021-03-23 DIAGNOSIS — S42202D Unspecified fracture of upper end of left humerus, subsequent encounter for fracture with routine healing: Secondary | ICD-10-CM | POA: Diagnosis not present

## 2021-03-23 DIAGNOSIS — K219 Gastro-esophageal reflux disease without esophagitis: Secondary | ICD-10-CM | POA: Diagnosis not present

## 2021-03-23 DIAGNOSIS — E119 Type 2 diabetes mellitus without complications: Secondary | ICD-10-CM | POA: Diagnosis not present

## 2021-03-23 DIAGNOSIS — G40909 Epilepsy, unspecified, not intractable, without status epilepticus: Secondary | ICD-10-CM | POA: Diagnosis not present

## 2021-03-24 DIAGNOSIS — C711 Malignant neoplasm of frontal lobe: Secondary | ICD-10-CM | POA: Diagnosis not present

## 2021-03-24 DIAGNOSIS — E785 Hyperlipidemia, unspecified: Secondary | ICD-10-CM | POA: Diagnosis not present

## 2021-03-24 DIAGNOSIS — S42202D Unspecified fracture of upper end of left humerus, subsequent encounter for fracture with routine healing: Secondary | ICD-10-CM | POA: Diagnosis not present

## 2021-03-24 DIAGNOSIS — R4701 Aphasia: Secondary | ICD-10-CM | POA: Diagnosis not present

## 2021-03-24 DIAGNOSIS — E119 Type 2 diabetes mellitus without complications: Secondary | ICD-10-CM | POA: Diagnosis not present

## 2021-03-24 DIAGNOSIS — K219 Gastro-esophageal reflux disease without esophagitis: Secondary | ICD-10-CM | POA: Diagnosis not present

## 2021-03-25 ENCOUNTER — Other Ambulatory Visit: Payer: Self-pay | Admitting: *Deleted

## 2021-03-25 DIAGNOSIS — C711 Malignant neoplasm of frontal lobe: Secondary | ICD-10-CM | POA: Diagnosis not present

## 2021-03-25 DIAGNOSIS — R4701 Aphasia: Secondary | ICD-10-CM | POA: Diagnosis not present

## 2021-03-25 DIAGNOSIS — E119 Type 2 diabetes mellitus without complications: Secondary | ICD-10-CM | POA: Diagnosis not present

## 2021-03-25 DIAGNOSIS — E785 Hyperlipidemia, unspecified: Secondary | ICD-10-CM | POA: Diagnosis not present

## 2021-03-25 DIAGNOSIS — K219 Gastro-esophageal reflux disease without esophagitis: Secondary | ICD-10-CM | POA: Diagnosis not present

## 2021-03-25 DIAGNOSIS — S42202D Unspecified fracture of upper end of left humerus, subsequent encounter for fracture with routine healing: Secondary | ICD-10-CM | POA: Diagnosis not present

## 2021-03-25 MED ORDER — LEVETIRACETAM 100 MG/ML PO SOLN: ORAL | 1 refills | Status: DC

## 2021-03-27 ENCOUNTER — Other Ambulatory Visit (HOSPITAL_COMMUNITY): Payer: Self-pay

## 2021-03-28 ENCOUNTER — Other Ambulatory Visit: Payer: Medicare Other

## 2021-03-28 DIAGNOSIS — S42202D Unspecified fracture of upper end of left humerus, subsequent encounter for fracture with routine healing: Secondary | ICD-10-CM | POA: Diagnosis not present

## 2021-03-28 DIAGNOSIS — E119 Type 2 diabetes mellitus without complications: Secondary | ICD-10-CM | POA: Diagnosis not present

## 2021-03-28 DIAGNOSIS — C711 Malignant neoplasm of frontal lobe: Secondary | ICD-10-CM | POA: Diagnosis not present

## 2021-03-28 DIAGNOSIS — R4701 Aphasia: Secondary | ICD-10-CM | POA: Diagnosis not present

## 2021-03-28 DIAGNOSIS — E785 Hyperlipidemia, unspecified: Secondary | ICD-10-CM | POA: Diagnosis not present

## 2021-03-28 DIAGNOSIS — K219 Gastro-esophageal reflux disease without esophagitis: Secondary | ICD-10-CM | POA: Diagnosis not present

## 2021-03-31 ENCOUNTER — Inpatient Hospital Stay: Payer: Medicare Other | Admitting: Internal Medicine

## 2021-03-31 ENCOUNTER — Inpatient Hospital Stay: Payer: Medicare Other

## 2021-03-31 ENCOUNTER — Other Ambulatory Visit (HOSPITAL_COMMUNITY): Payer: Self-pay

## 2021-03-31 DIAGNOSIS — K219 Gastro-esophageal reflux disease without esophagitis: Secondary | ICD-10-CM | POA: Diagnosis not present

## 2021-03-31 DIAGNOSIS — S42202D Unspecified fracture of upper end of left humerus, subsequent encounter for fracture with routine healing: Secondary | ICD-10-CM | POA: Diagnosis not present

## 2021-03-31 DIAGNOSIS — E119 Type 2 diabetes mellitus without complications: Secondary | ICD-10-CM | POA: Diagnosis not present

## 2021-03-31 DIAGNOSIS — C711 Malignant neoplasm of frontal lobe: Secondary | ICD-10-CM | POA: Diagnosis not present

## 2021-03-31 DIAGNOSIS — E785 Hyperlipidemia, unspecified: Secondary | ICD-10-CM | POA: Diagnosis not present

## 2021-03-31 DIAGNOSIS — R4701 Aphasia: Secondary | ICD-10-CM | POA: Diagnosis not present

## 2021-04-01 DIAGNOSIS — E119 Type 2 diabetes mellitus without complications: Secondary | ICD-10-CM | POA: Diagnosis not present

## 2021-04-01 DIAGNOSIS — E785 Hyperlipidemia, unspecified: Secondary | ICD-10-CM | POA: Diagnosis not present

## 2021-04-01 DIAGNOSIS — R4701 Aphasia: Secondary | ICD-10-CM | POA: Diagnosis not present

## 2021-04-01 DIAGNOSIS — K219 Gastro-esophageal reflux disease without esophagitis: Secondary | ICD-10-CM | POA: Diagnosis not present

## 2021-04-01 DIAGNOSIS — S42202D Unspecified fracture of upper end of left humerus, subsequent encounter for fracture with routine healing: Secondary | ICD-10-CM | POA: Diagnosis not present

## 2021-04-01 DIAGNOSIS — C711 Malignant neoplasm of frontal lobe: Secondary | ICD-10-CM | POA: Diagnosis not present

## 2021-04-02 DIAGNOSIS — S42202A Unspecified fracture of upper end of left humerus, initial encounter for closed fracture: Secondary | ICD-10-CM | POA: Diagnosis not present

## 2021-04-03 DIAGNOSIS — R4701 Aphasia: Secondary | ICD-10-CM | POA: Diagnosis not present

## 2021-04-03 DIAGNOSIS — E119 Type 2 diabetes mellitus without complications: Secondary | ICD-10-CM | POA: Diagnosis not present

## 2021-04-03 DIAGNOSIS — E785 Hyperlipidemia, unspecified: Secondary | ICD-10-CM | POA: Diagnosis not present

## 2021-04-03 DIAGNOSIS — S42202D Unspecified fracture of upper end of left humerus, subsequent encounter for fracture with routine healing: Secondary | ICD-10-CM | POA: Diagnosis not present

## 2021-04-03 DIAGNOSIS — K219 Gastro-esophageal reflux disease without esophagitis: Secondary | ICD-10-CM | POA: Diagnosis not present

## 2021-04-03 DIAGNOSIS — C711 Malignant neoplasm of frontal lobe: Secondary | ICD-10-CM | POA: Diagnosis not present

## 2021-04-04 DIAGNOSIS — C711 Malignant neoplasm of frontal lobe: Secondary | ICD-10-CM | POA: Diagnosis not present

## 2021-04-04 DIAGNOSIS — K219 Gastro-esophageal reflux disease without esophagitis: Secondary | ICD-10-CM | POA: Diagnosis not present

## 2021-04-04 DIAGNOSIS — S42202D Unspecified fracture of upper end of left humerus, subsequent encounter for fracture with routine healing: Secondary | ICD-10-CM | POA: Diagnosis not present

## 2021-04-04 DIAGNOSIS — R4701 Aphasia: Secondary | ICD-10-CM | POA: Diagnosis not present

## 2021-04-04 DIAGNOSIS — E785 Hyperlipidemia, unspecified: Secondary | ICD-10-CM | POA: Diagnosis not present

## 2021-04-04 DIAGNOSIS — E119 Type 2 diabetes mellitus without complications: Secondary | ICD-10-CM | POA: Diagnosis not present

## 2021-04-07 DIAGNOSIS — E119 Type 2 diabetes mellitus without complications: Secondary | ICD-10-CM | POA: Diagnosis not present

## 2021-04-07 DIAGNOSIS — R4701 Aphasia: Secondary | ICD-10-CM | POA: Diagnosis not present

## 2021-04-07 DIAGNOSIS — C711 Malignant neoplasm of frontal lobe: Secondary | ICD-10-CM | POA: Diagnosis not present

## 2021-04-07 DIAGNOSIS — S42202D Unspecified fracture of upper end of left humerus, subsequent encounter for fracture with routine healing: Secondary | ICD-10-CM | POA: Diagnosis not present

## 2021-04-07 DIAGNOSIS — K219 Gastro-esophageal reflux disease without esophagitis: Secondary | ICD-10-CM | POA: Diagnosis not present

## 2021-04-07 DIAGNOSIS — E785 Hyperlipidemia, unspecified: Secondary | ICD-10-CM | POA: Diagnosis not present

## 2021-04-11 DIAGNOSIS — C711 Malignant neoplasm of frontal lobe: Secondary | ICD-10-CM | POA: Diagnosis not present

## 2021-04-11 DIAGNOSIS — S42202D Unspecified fracture of upper end of left humerus, subsequent encounter for fracture with routine healing: Secondary | ICD-10-CM | POA: Diagnosis not present

## 2021-04-11 DIAGNOSIS — E119 Type 2 diabetes mellitus without complications: Secondary | ICD-10-CM | POA: Diagnosis not present

## 2021-04-11 DIAGNOSIS — K219 Gastro-esophageal reflux disease without esophagitis: Secondary | ICD-10-CM | POA: Diagnosis not present

## 2021-04-11 DIAGNOSIS — E785 Hyperlipidemia, unspecified: Secondary | ICD-10-CM | POA: Diagnosis not present

## 2021-04-11 DIAGNOSIS — R4701 Aphasia: Secondary | ICD-10-CM | POA: Diagnosis not present

## 2021-04-14 DIAGNOSIS — E119 Type 2 diabetes mellitus without complications: Secondary | ICD-10-CM | POA: Diagnosis not present

## 2021-04-14 DIAGNOSIS — E785 Hyperlipidemia, unspecified: Secondary | ICD-10-CM | POA: Diagnosis not present

## 2021-04-14 DIAGNOSIS — S42202D Unspecified fracture of upper end of left humerus, subsequent encounter for fracture with routine healing: Secondary | ICD-10-CM | POA: Diagnosis not present

## 2021-04-14 DIAGNOSIS — K219 Gastro-esophageal reflux disease without esophagitis: Secondary | ICD-10-CM | POA: Diagnosis not present

## 2021-04-14 DIAGNOSIS — C711 Malignant neoplasm of frontal lobe: Secondary | ICD-10-CM | POA: Diagnosis not present

## 2021-04-14 DIAGNOSIS — R4701 Aphasia: Secondary | ICD-10-CM | POA: Diagnosis not present

## 2021-04-15 DIAGNOSIS — C711 Malignant neoplasm of frontal lobe: Secondary | ICD-10-CM | POA: Diagnosis not present

## 2021-04-15 DIAGNOSIS — K219 Gastro-esophageal reflux disease without esophagitis: Secondary | ICD-10-CM | POA: Diagnosis not present

## 2021-04-15 DIAGNOSIS — E785 Hyperlipidemia, unspecified: Secondary | ICD-10-CM | POA: Diagnosis not present

## 2021-04-15 DIAGNOSIS — E119 Type 2 diabetes mellitus without complications: Secondary | ICD-10-CM | POA: Diagnosis not present

## 2021-04-15 DIAGNOSIS — S42202D Unspecified fracture of upper end of left humerus, subsequent encounter for fracture with routine healing: Secondary | ICD-10-CM | POA: Diagnosis not present

## 2021-04-15 DIAGNOSIS — R4701 Aphasia: Secondary | ICD-10-CM | POA: Diagnosis not present

## 2021-04-17 DIAGNOSIS — K219 Gastro-esophageal reflux disease without esophagitis: Secondary | ICD-10-CM | POA: Diagnosis not present

## 2021-04-17 DIAGNOSIS — C711 Malignant neoplasm of frontal lobe: Secondary | ICD-10-CM | POA: Diagnosis not present

## 2021-04-17 DIAGNOSIS — R4701 Aphasia: Secondary | ICD-10-CM | POA: Diagnosis not present

## 2021-04-17 DIAGNOSIS — E785 Hyperlipidemia, unspecified: Secondary | ICD-10-CM | POA: Diagnosis not present

## 2021-04-17 DIAGNOSIS — E119 Type 2 diabetes mellitus without complications: Secondary | ICD-10-CM | POA: Diagnosis not present

## 2021-04-17 DIAGNOSIS — S42202D Unspecified fracture of upper end of left humerus, subsequent encounter for fracture with routine healing: Secondary | ICD-10-CM | POA: Diagnosis not present

## 2021-04-18 DIAGNOSIS — K219 Gastro-esophageal reflux disease without esophagitis: Secondary | ICD-10-CM | POA: Diagnosis not present

## 2021-04-18 DIAGNOSIS — E119 Type 2 diabetes mellitus without complications: Secondary | ICD-10-CM | POA: Diagnosis not present

## 2021-04-18 DIAGNOSIS — E785 Hyperlipidemia, unspecified: Secondary | ICD-10-CM | POA: Diagnosis not present

## 2021-04-18 DIAGNOSIS — R4701 Aphasia: Secondary | ICD-10-CM | POA: Diagnosis not present

## 2021-04-18 DIAGNOSIS — S42202D Unspecified fracture of upper end of left humerus, subsequent encounter for fracture with routine healing: Secondary | ICD-10-CM | POA: Diagnosis not present

## 2021-04-18 DIAGNOSIS — C711 Malignant neoplasm of frontal lobe: Secondary | ICD-10-CM | POA: Diagnosis not present

## 2021-04-21 DIAGNOSIS — R4701 Aphasia: Secondary | ICD-10-CM | POA: Diagnosis not present

## 2021-04-21 DIAGNOSIS — E785 Hyperlipidemia, unspecified: Secondary | ICD-10-CM | POA: Diagnosis not present

## 2021-04-21 DIAGNOSIS — S42202D Unspecified fracture of upper end of left humerus, subsequent encounter for fracture with routine healing: Secondary | ICD-10-CM | POA: Diagnosis not present

## 2021-04-21 DIAGNOSIS — K219 Gastro-esophageal reflux disease without esophagitis: Secondary | ICD-10-CM | POA: Diagnosis not present

## 2021-04-21 DIAGNOSIS — C711 Malignant neoplasm of frontal lobe: Secondary | ICD-10-CM | POA: Diagnosis not present

## 2021-04-21 DIAGNOSIS — E119 Type 2 diabetes mellitus without complications: Secondary | ICD-10-CM | POA: Diagnosis not present

## 2021-04-23 DIAGNOSIS — E119 Type 2 diabetes mellitus without complications: Secondary | ICD-10-CM | POA: Diagnosis not present

## 2021-04-23 DIAGNOSIS — E039 Hypothyroidism, unspecified: Secondary | ICD-10-CM | POA: Diagnosis not present

## 2021-04-23 DIAGNOSIS — G40909 Epilepsy, unspecified, not intractable, without status epilepticus: Secondary | ICD-10-CM | POA: Diagnosis not present

## 2021-04-23 DIAGNOSIS — R4701 Aphasia: Secondary | ICD-10-CM | POA: Diagnosis not present

## 2021-04-23 DIAGNOSIS — K219 Gastro-esophageal reflux disease without esophagitis: Secondary | ICD-10-CM | POA: Diagnosis not present

## 2021-04-23 DIAGNOSIS — J302 Other seasonal allergic rhinitis: Secondary | ICD-10-CM | POA: Diagnosis not present

## 2021-04-23 DIAGNOSIS — C711 Malignant neoplasm of frontal lobe: Secondary | ICD-10-CM | POA: Diagnosis not present

## 2021-04-23 DIAGNOSIS — I10 Essential (primary) hypertension: Secondary | ICD-10-CM | POA: Diagnosis not present

## 2021-04-23 DIAGNOSIS — E785 Hyperlipidemia, unspecified: Secondary | ICD-10-CM | POA: Diagnosis not present

## 2021-04-23 DIAGNOSIS — S42202D Unspecified fracture of upper end of left humerus, subsequent encounter for fracture with routine healing: Secondary | ICD-10-CM | POA: Diagnosis not present

## 2021-04-25 DIAGNOSIS — E119 Type 2 diabetes mellitus without complications: Secondary | ICD-10-CM | POA: Diagnosis not present

## 2021-04-25 DIAGNOSIS — E785 Hyperlipidemia, unspecified: Secondary | ICD-10-CM | POA: Diagnosis not present

## 2021-04-25 DIAGNOSIS — R4701 Aphasia: Secondary | ICD-10-CM | POA: Diagnosis not present

## 2021-04-25 DIAGNOSIS — K219 Gastro-esophageal reflux disease without esophagitis: Secondary | ICD-10-CM | POA: Diagnosis not present

## 2021-04-25 DIAGNOSIS — C711 Malignant neoplasm of frontal lobe: Secondary | ICD-10-CM | POA: Diagnosis not present

## 2021-04-25 DIAGNOSIS — S42202D Unspecified fracture of upper end of left humerus, subsequent encounter for fracture with routine healing: Secondary | ICD-10-CM | POA: Diagnosis not present

## 2021-04-28 DIAGNOSIS — C711 Malignant neoplasm of frontal lobe: Secondary | ICD-10-CM | POA: Diagnosis not present

## 2021-04-28 DIAGNOSIS — E119 Type 2 diabetes mellitus without complications: Secondary | ICD-10-CM | POA: Diagnosis not present

## 2021-04-28 DIAGNOSIS — E785 Hyperlipidemia, unspecified: Secondary | ICD-10-CM | POA: Diagnosis not present

## 2021-04-28 DIAGNOSIS — R4701 Aphasia: Secondary | ICD-10-CM | POA: Diagnosis not present

## 2021-04-28 DIAGNOSIS — S42202D Unspecified fracture of upper end of left humerus, subsequent encounter for fracture with routine healing: Secondary | ICD-10-CM | POA: Diagnosis not present

## 2021-04-28 DIAGNOSIS — K219 Gastro-esophageal reflux disease without esophagitis: Secondary | ICD-10-CM | POA: Diagnosis not present

## 2021-04-30 DIAGNOSIS — S42202A Unspecified fracture of upper end of left humerus, initial encounter for closed fracture: Secondary | ICD-10-CM | POA: Diagnosis not present

## 2021-05-01 DIAGNOSIS — E119 Type 2 diabetes mellitus without complications: Secondary | ICD-10-CM | POA: Diagnosis not present

## 2021-05-01 DIAGNOSIS — R4701 Aphasia: Secondary | ICD-10-CM | POA: Diagnosis not present

## 2021-05-01 DIAGNOSIS — S42202D Unspecified fracture of upper end of left humerus, subsequent encounter for fracture with routine healing: Secondary | ICD-10-CM | POA: Diagnosis not present

## 2021-05-01 DIAGNOSIS — K219 Gastro-esophageal reflux disease without esophagitis: Secondary | ICD-10-CM | POA: Diagnosis not present

## 2021-05-01 DIAGNOSIS — C711 Malignant neoplasm of frontal lobe: Secondary | ICD-10-CM | POA: Diagnosis not present

## 2021-05-01 DIAGNOSIS — E785 Hyperlipidemia, unspecified: Secondary | ICD-10-CM | POA: Diagnosis not present

## 2021-05-02 DIAGNOSIS — E785 Hyperlipidemia, unspecified: Secondary | ICD-10-CM | POA: Diagnosis not present

## 2021-05-02 DIAGNOSIS — C711 Malignant neoplasm of frontal lobe: Secondary | ICD-10-CM | POA: Diagnosis not present

## 2021-05-02 DIAGNOSIS — R4701 Aphasia: Secondary | ICD-10-CM | POA: Diagnosis not present

## 2021-05-02 DIAGNOSIS — E119 Type 2 diabetes mellitus without complications: Secondary | ICD-10-CM | POA: Diagnosis not present

## 2021-05-02 DIAGNOSIS — K219 Gastro-esophageal reflux disease without esophagitis: Secondary | ICD-10-CM | POA: Diagnosis not present

## 2021-05-02 DIAGNOSIS — S42202D Unspecified fracture of upper end of left humerus, subsequent encounter for fracture with routine healing: Secondary | ICD-10-CM | POA: Diagnosis not present

## 2021-05-05 DIAGNOSIS — E119 Type 2 diabetes mellitus without complications: Secondary | ICD-10-CM | POA: Diagnosis not present

## 2021-05-05 DIAGNOSIS — K219 Gastro-esophageal reflux disease without esophagitis: Secondary | ICD-10-CM | POA: Diagnosis not present

## 2021-05-05 DIAGNOSIS — S42202D Unspecified fracture of upper end of left humerus, subsequent encounter for fracture with routine healing: Secondary | ICD-10-CM | POA: Diagnosis not present

## 2021-05-05 DIAGNOSIS — R4701 Aphasia: Secondary | ICD-10-CM | POA: Diagnosis not present

## 2021-05-05 DIAGNOSIS — E785 Hyperlipidemia, unspecified: Secondary | ICD-10-CM | POA: Diagnosis not present

## 2021-05-05 DIAGNOSIS — C711 Malignant neoplasm of frontal lobe: Secondary | ICD-10-CM | POA: Diagnosis not present

## 2021-05-08 DIAGNOSIS — E785 Hyperlipidemia, unspecified: Secondary | ICD-10-CM | POA: Diagnosis not present

## 2021-05-08 DIAGNOSIS — C711 Malignant neoplasm of frontal lobe: Secondary | ICD-10-CM | POA: Diagnosis not present

## 2021-05-08 DIAGNOSIS — E119 Type 2 diabetes mellitus without complications: Secondary | ICD-10-CM | POA: Diagnosis not present

## 2021-05-08 DIAGNOSIS — K219 Gastro-esophageal reflux disease without esophagitis: Secondary | ICD-10-CM | POA: Diagnosis not present

## 2021-05-08 DIAGNOSIS — R4701 Aphasia: Secondary | ICD-10-CM | POA: Diagnosis not present

## 2021-05-08 DIAGNOSIS — S42202D Unspecified fracture of upper end of left humerus, subsequent encounter for fracture with routine healing: Secondary | ICD-10-CM | POA: Diagnosis not present

## 2021-05-09 DIAGNOSIS — C711 Malignant neoplasm of frontal lobe: Secondary | ICD-10-CM | POA: Diagnosis not present

## 2021-05-09 DIAGNOSIS — S42202D Unspecified fracture of upper end of left humerus, subsequent encounter for fracture with routine healing: Secondary | ICD-10-CM | POA: Diagnosis not present

## 2021-05-09 DIAGNOSIS — E785 Hyperlipidemia, unspecified: Secondary | ICD-10-CM | POA: Diagnosis not present

## 2021-05-09 DIAGNOSIS — E119 Type 2 diabetes mellitus without complications: Secondary | ICD-10-CM | POA: Diagnosis not present

## 2021-05-09 DIAGNOSIS — K219 Gastro-esophageal reflux disease without esophagitis: Secondary | ICD-10-CM | POA: Diagnosis not present

## 2021-05-09 DIAGNOSIS — R4701 Aphasia: Secondary | ICD-10-CM | POA: Diagnosis not present

## 2021-05-12 DIAGNOSIS — C711 Malignant neoplasm of frontal lobe: Secondary | ICD-10-CM | POA: Diagnosis not present

## 2021-05-12 DIAGNOSIS — E119 Type 2 diabetes mellitus without complications: Secondary | ICD-10-CM | POA: Diagnosis not present

## 2021-05-12 DIAGNOSIS — S42202D Unspecified fracture of upper end of left humerus, subsequent encounter for fracture with routine healing: Secondary | ICD-10-CM | POA: Diagnosis not present

## 2021-05-12 DIAGNOSIS — K219 Gastro-esophageal reflux disease without esophagitis: Secondary | ICD-10-CM | POA: Diagnosis not present

## 2021-05-12 DIAGNOSIS — R4701 Aphasia: Secondary | ICD-10-CM | POA: Diagnosis not present

## 2021-05-12 DIAGNOSIS — E785 Hyperlipidemia, unspecified: Secondary | ICD-10-CM | POA: Diagnosis not present

## 2021-05-16 DIAGNOSIS — E785 Hyperlipidemia, unspecified: Secondary | ICD-10-CM | POA: Diagnosis not present

## 2021-05-16 DIAGNOSIS — E119 Type 2 diabetes mellitus without complications: Secondary | ICD-10-CM | POA: Diagnosis not present

## 2021-05-16 DIAGNOSIS — K219 Gastro-esophageal reflux disease without esophagitis: Secondary | ICD-10-CM | POA: Diagnosis not present

## 2021-05-16 DIAGNOSIS — R4701 Aphasia: Secondary | ICD-10-CM | POA: Diagnosis not present

## 2021-05-16 DIAGNOSIS — C711 Malignant neoplasm of frontal lobe: Secondary | ICD-10-CM | POA: Diagnosis not present

## 2021-05-16 DIAGNOSIS — S42202D Unspecified fracture of upper end of left humerus, subsequent encounter for fracture with routine healing: Secondary | ICD-10-CM | POA: Diagnosis not present

## 2021-05-19 DIAGNOSIS — K219 Gastro-esophageal reflux disease without esophagitis: Secondary | ICD-10-CM | POA: Diagnosis not present

## 2021-05-19 DIAGNOSIS — C711 Malignant neoplasm of frontal lobe: Secondary | ICD-10-CM | POA: Diagnosis not present

## 2021-05-19 DIAGNOSIS — E119 Type 2 diabetes mellitus without complications: Secondary | ICD-10-CM | POA: Diagnosis not present

## 2021-05-19 DIAGNOSIS — R4701 Aphasia: Secondary | ICD-10-CM | POA: Diagnosis not present

## 2021-05-19 DIAGNOSIS — S42202D Unspecified fracture of upper end of left humerus, subsequent encounter for fracture with routine healing: Secondary | ICD-10-CM | POA: Diagnosis not present

## 2021-05-19 DIAGNOSIS — E785 Hyperlipidemia, unspecified: Secondary | ICD-10-CM | POA: Diagnosis not present

## 2021-05-21 DIAGNOSIS — J302 Other seasonal allergic rhinitis: Secondary | ICD-10-CM | POA: Diagnosis not present

## 2021-05-21 DIAGNOSIS — C711 Malignant neoplasm of frontal lobe: Secondary | ICD-10-CM | POA: Diagnosis not present

## 2021-05-21 DIAGNOSIS — E785 Hyperlipidemia, unspecified: Secondary | ICD-10-CM | POA: Diagnosis not present

## 2021-05-21 DIAGNOSIS — R4701 Aphasia: Secondary | ICD-10-CM | POA: Diagnosis not present

## 2021-05-21 DIAGNOSIS — G40909 Epilepsy, unspecified, not intractable, without status epilepticus: Secondary | ICD-10-CM | POA: Diagnosis not present

## 2021-05-21 DIAGNOSIS — K219 Gastro-esophageal reflux disease without esophagitis: Secondary | ICD-10-CM | POA: Diagnosis not present

## 2021-05-21 DIAGNOSIS — S42202D Unspecified fracture of upper end of left humerus, subsequent encounter for fracture with routine healing: Secondary | ICD-10-CM | POA: Diagnosis not present

## 2021-05-21 DIAGNOSIS — I10 Essential (primary) hypertension: Secondary | ICD-10-CM | POA: Diagnosis not present

## 2021-05-21 DIAGNOSIS — E119 Type 2 diabetes mellitus without complications: Secondary | ICD-10-CM | POA: Diagnosis not present

## 2021-05-21 DIAGNOSIS — E039 Hypothyroidism, unspecified: Secondary | ICD-10-CM | POA: Diagnosis not present

## 2021-05-22 DIAGNOSIS — R4701 Aphasia: Secondary | ICD-10-CM | POA: Diagnosis not present

## 2021-05-22 DIAGNOSIS — E785 Hyperlipidemia, unspecified: Secondary | ICD-10-CM | POA: Diagnosis not present

## 2021-05-22 DIAGNOSIS — K219 Gastro-esophageal reflux disease without esophagitis: Secondary | ICD-10-CM | POA: Diagnosis not present

## 2021-05-22 DIAGNOSIS — S42202D Unspecified fracture of upper end of left humerus, subsequent encounter for fracture with routine healing: Secondary | ICD-10-CM | POA: Diagnosis not present

## 2021-05-22 DIAGNOSIS — C711 Malignant neoplasm of frontal lobe: Secondary | ICD-10-CM | POA: Diagnosis not present

## 2021-05-22 DIAGNOSIS — E119 Type 2 diabetes mellitus without complications: Secondary | ICD-10-CM | POA: Diagnosis not present

## 2021-05-23 DIAGNOSIS — E785 Hyperlipidemia, unspecified: Secondary | ICD-10-CM | POA: Diagnosis not present

## 2021-05-23 DIAGNOSIS — K219 Gastro-esophageal reflux disease without esophagitis: Secondary | ICD-10-CM | POA: Diagnosis not present

## 2021-05-23 DIAGNOSIS — E119 Type 2 diabetes mellitus without complications: Secondary | ICD-10-CM | POA: Diagnosis not present

## 2021-05-23 DIAGNOSIS — R4701 Aphasia: Secondary | ICD-10-CM | POA: Diagnosis not present

## 2021-05-23 DIAGNOSIS — C711 Malignant neoplasm of frontal lobe: Secondary | ICD-10-CM | POA: Diagnosis not present

## 2021-05-23 DIAGNOSIS — S42202D Unspecified fracture of upper end of left humerus, subsequent encounter for fracture with routine healing: Secondary | ICD-10-CM | POA: Diagnosis not present

## 2021-05-26 DIAGNOSIS — R4701 Aphasia: Secondary | ICD-10-CM | POA: Diagnosis not present

## 2021-05-26 DIAGNOSIS — K219 Gastro-esophageal reflux disease without esophagitis: Secondary | ICD-10-CM | POA: Diagnosis not present

## 2021-05-26 DIAGNOSIS — S42202D Unspecified fracture of upper end of left humerus, subsequent encounter for fracture with routine healing: Secondary | ICD-10-CM | POA: Diagnosis not present

## 2021-05-26 DIAGNOSIS — C711 Malignant neoplasm of frontal lobe: Secondary | ICD-10-CM | POA: Diagnosis not present

## 2021-05-26 DIAGNOSIS — E119 Type 2 diabetes mellitus without complications: Secondary | ICD-10-CM | POA: Diagnosis not present

## 2021-05-26 DIAGNOSIS — E785 Hyperlipidemia, unspecified: Secondary | ICD-10-CM | POA: Diagnosis not present

## 2021-05-28 DIAGNOSIS — S42202A Unspecified fracture of upper end of left humerus, initial encounter for closed fracture: Secondary | ICD-10-CM | POA: Diagnosis not present

## 2021-05-29 DIAGNOSIS — K219 Gastro-esophageal reflux disease without esophagitis: Secondary | ICD-10-CM | POA: Diagnosis not present

## 2021-05-29 DIAGNOSIS — S42202D Unspecified fracture of upper end of left humerus, subsequent encounter for fracture with routine healing: Secondary | ICD-10-CM | POA: Diagnosis not present

## 2021-05-29 DIAGNOSIS — R4701 Aphasia: Secondary | ICD-10-CM | POA: Diagnosis not present

## 2021-05-29 DIAGNOSIS — E785 Hyperlipidemia, unspecified: Secondary | ICD-10-CM | POA: Diagnosis not present

## 2021-05-29 DIAGNOSIS — E119 Type 2 diabetes mellitus without complications: Secondary | ICD-10-CM | POA: Diagnosis not present

## 2021-05-29 DIAGNOSIS — C711 Malignant neoplasm of frontal lobe: Secondary | ICD-10-CM | POA: Diagnosis not present

## 2021-05-30 DIAGNOSIS — E119 Type 2 diabetes mellitus without complications: Secondary | ICD-10-CM | POA: Diagnosis not present

## 2021-05-30 DIAGNOSIS — C711 Malignant neoplasm of frontal lobe: Secondary | ICD-10-CM | POA: Diagnosis not present

## 2021-05-30 DIAGNOSIS — S42202D Unspecified fracture of upper end of left humerus, subsequent encounter for fracture with routine healing: Secondary | ICD-10-CM | POA: Diagnosis not present

## 2021-05-30 DIAGNOSIS — R4701 Aphasia: Secondary | ICD-10-CM | POA: Diagnosis not present

## 2021-05-30 DIAGNOSIS — K219 Gastro-esophageal reflux disease without esophagitis: Secondary | ICD-10-CM | POA: Diagnosis not present

## 2021-05-30 DIAGNOSIS — E785 Hyperlipidemia, unspecified: Secondary | ICD-10-CM | POA: Diagnosis not present

## 2021-06-02 DIAGNOSIS — E119 Type 2 diabetes mellitus without complications: Secondary | ICD-10-CM | POA: Diagnosis not present

## 2021-06-02 DIAGNOSIS — C711 Malignant neoplasm of frontal lobe: Secondary | ICD-10-CM | POA: Diagnosis not present

## 2021-06-02 DIAGNOSIS — E785 Hyperlipidemia, unspecified: Secondary | ICD-10-CM | POA: Diagnosis not present

## 2021-06-02 DIAGNOSIS — R4701 Aphasia: Secondary | ICD-10-CM | POA: Diagnosis not present

## 2021-06-02 DIAGNOSIS — K219 Gastro-esophageal reflux disease without esophagitis: Secondary | ICD-10-CM | POA: Diagnosis not present

## 2021-06-02 DIAGNOSIS — S42202D Unspecified fracture of upper end of left humerus, subsequent encounter for fracture with routine healing: Secondary | ICD-10-CM | POA: Diagnosis not present

## 2021-06-05 DIAGNOSIS — E785 Hyperlipidemia, unspecified: Secondary | ICD-10-CM | POA: Diagnosis not present

## 2021-06-05 DIAGNOSIS — E119 Type 2 diabetes mellitus without complications: Secondary | ICD-10-CM | POA: Diagnosis not present

## 2021-06-05 DIAGNOSIS — S42202D Unspecified fracture of upper end of left humerus, subsequent encounter for fracture with routine healing: Secondary | ICD-10-CM | POA: Diagnosis not present

## 2021-06-05 DIAGNOSIS — C711 Malignant neoplasm of frontal lobe: Secondary | ICD-10-CM | POA: Diagnosis not present

## 2021-06-05 DIAGNOSIS — R4701 Aphasia: Secondary | ICD-10-CM | POA: Diagnosis not present

## 2021-06-05 DIAGNOSIS — K219 Gastro-esophageal reflux disease without esophagitis: Secondary | ICD-10-CM | POA: Diagnosis not present

## 2021-06-06 DIAGNOSIS — R4701 Aphasia: Secondary | ICD-10-CM | POA: Diagnosis not present

## 2021-06-06 DIAGNOSIS — E119 Type 2 diabetes mellitus without complications: Secondary | ICD-10-CM | POA: Diagnosis not present

## 2021-06-06 DIAGNOSIS — E785 Hyperlipidemia, unspecified: Secondary | ICD-10-CM | POA: Diagnosis not present

## 2021-06-06 DIAGNOSIS — C711 Malignant neoplasm of frontal lobe: Secondary | ICD-10-CM | POA: Diagnosis not present

## 2021-06-06 DIAGNOSIS — S42202D Unspecified fracture of upper end of left humerus, subsequent encounter for fracture with routine healing: Secondary | ICD-10-CM | POA: Diagnosis not present

## 2021-06-06 DIAGNOSIS — K219 Gastro-esophageal reflux disease without esophagitis: Secondary | ICD-10-CM | POA: Diagnosis not present

## 2021-06-08 DIAGNOSIS — K219 Gastro-esophageal reflux disease without esophagitis: Secondary | ICD-10-CM | POA: Diagnosis not present

## 2021-06-08 DIAGNOSIS — S42202D Unspecified fracture of upper end of left humerus, subsequent encounter for fracture with routine healing: Secondary | ICD-10-CM | POA: Diagnosis not present

## 2021-06-08 DIAGNOSIS — E785 Hyperlipidemia, unspecified: Secondary | ICD-10-CM | POA: Diagnosis not present

## 2021-06-08 DIAGNOSIS — E119 Type 2 diabetes mellitus without complications: Secondary | ICD-10-CM | POA: Diagnosis not present

## 2021-06-08 DIAGNOSIS — C711 Malignant neoplasm of frontal lobe: Secondary | ICD-10-CM | POA: Diagnosis not present

## 2021-06-08 DIAGNOSIS — R4701 Aphasia: Secondary | ICD-10-CM | POA: Diagnosis not present

## 2021-06-09 DIAGNOSIS — K219 Gastro-esophageal reflux disease without esophagitis: Secondary | ICD-10-CM | POA: Diagnosis not present

## 2021-06-09 DIAGNOSIS — E119 Type 2 diabetes mellitus without complications: Secondary | ICD-10-CM | POA: Diagnosis not present

## 2021-06-09 DIAGNOSIS — C711 Malignant neoplasm of frontal lobe: Secondary | ICD-10-CM | POA: Diagnosis not present

## 2021-06-09 DIAGNOSIS — E785 Hyperlipidemia, unspecified: Secondary | ICD-10-CM | POA: Diagnosis not present

## 2021-06-09 DIAGNOSIS — S42202D Unspecified fracture of upper end of left humerus, subsequent encounter for fracture with routine healing: Secondary | ICD-10-CM | POA: Diagnosis not present

## 2021-06-09 DIAGNOSIS — R4701 Aphasia: Secondary | ICD-10-CM | POA: Diagnosis not present

## 2021-06-12 DIAGNOSIS — C711 Malignant neoplasm of frontal lobe: Secondary | ICD-10-CM | POA: Diagnosis not present

## 2021-06-12 DIAGNOSIS — K219 Gastro-esophageal reflux disease without esophagitis: Secondary | ICD-10-CM | POA: Diagnosis not present

## 2021-06-12 DIAGNOSIS — S42202D Unspecified fracture of upper end of left humerus, subsequent encounter for fracture with routine healing: Secondary | ICD-10-CM | POA: Diagnosis not present

## 2021-06-12 DIAGNOSIS — R4701 Aphasia: Secondary | ICD-10-CM | POA: Diagnosis not present

## 2021-06-12 DIAGNOSIS — E119 Type 2 diabetes mellitus without complications: Secondary | ICD-10-CM | POA: Diagnosis not present

## 2021-06-12 DIAGNOSIS — E785 Hyperlipidemia, unspecified: Secondary | ICD-10-CM | POA: Diagnosis not present

## 2021-06-13 DIAGNOSIS — C711 Malignant neoplasm of frontal lobe: Secondary | ICD-10-CM | POA: Diagnosis not present

## 2021-06-13 DIAGNOSIS — K219 Gastro-esophageal reflux disease without esophagitis: Secondary | ICD-10-CM | POA: Diagnosis not present

## 2021-06-13 DIAGNOSIS — R4701 Aphasia: Secondary | ICD-10-CM | POA: Diagnosis not present

## 2021-06-13 DIAGNOSIS — E785 Hyperlipidemia, unspecified: Secondary | ICD-10-CM | POA: Diagnosis not present

## 2021-06-13 DIAGNOSIS — E119 Type 2 diabetes mellitus without complications: Secondary | ICD-10-CM | POA: Diagnosis not present

## 2021-06-13 DIAGNOSIS — S42202D Unspecified fracture of upper end of left humerus, subsequent encounter for fracture with routine healing: Secondary | ICD-10-CM | POA: Diagnosis not present

## 2021-06-16 DIAGNOSIS — C711 Malignant neoplasm of frontal lobe: Secondary | ICD-10-CM | POA: Diagnosis not present

## 2021-06-16 DIAGNOSIS — R4701 Aphasia: Secondary | ICD-10-CM | POA: Diagnosis not present

## 2021-06-16 DIAGNOSIS — E785 Hyperlipidemia, unspecified: Secondary | ICD-10-CM | POA: Diagnosis not present

## 2021-06-16 DIAGNOSIS — K219 Gastro-esophageal reflux disease without esophagitis: Secondary | ICD-10-CM | POA: Diagnosis not present

## 2021-06-16 DIAGNOSIS — E119 Type 2 diabetes mellitus without complications: Secondary | ICD-10-CM | POA: Diagnosis not present

## 2021-06-16 DIAGNOSIS — S42202D Unspecified fracture of upper end of left humerus, subsequent encounter for fracture with routine healing: Secondary | ICD-10-CM | POA: Diagnosis not present

## 2021-06-18 DIAGNOSIS — R4701 Aphasia: Secondary | ICD-10-CM | POA: Diagnosis not present

## 2021-06-18 DIAGNOSIS — C711 Malignant neoplasm of frontal lobe: Secondary | ICD-10-CM | POA: Diagnosis not present

## 2021-06-18 DIAGNOSIS — E119 Type 2 diabetes mellitus without complications: Secondary | ICD-10-CM | POA: Diagnosis not present

## 2021-06-18 DIAGNOSIS — E785 Hyperlipidemia, unspecified: Secondary | ICD-10-CM | POA: Diagnosis not present

## 2021-06-18 DIAGNOSIS — K219 Gastro-esophageal reflux disease without esophagitis: Secondary | ICD-10-CM | POA: Diagnosis not present

## 2021-06-18 DIAGNOSIS — S42202D Unspecified fracture of upper end of left humerus, subsequent encounter for fracture with routine healing: Secondary | ICD-10-CM | POA: Diagnosis not present

## 2021-06-20 DIAGNOSIS — E785 Hyperlipidemia, unspecified: Secondary | ICD-10-CM | POA: Diagnosis not present

## 2021-06-20 DIAGNOSIS — S42202D Unspecified fracture of upper end of left humerus, subsequent encounter for fracture with routine healing: Secondary | ICD-10-CM | POA: Diagnosis not present

## 2021-06-20 DIAGNOSIS — E119 Type 2 diabetes mellitus without complications: Secondary | ICD-10-CM | POA: Diagnosis not present

## 2021-06-20 DIAGNOSIS — C711 Malignant neoplasm of frontal lobe: Secondary | ICD-10-CM | POA: Diagnosis not present

## 2021-06-20 DIAGNOSIS — R4701 Aphasia: Secondary | ICD-10-CM | POA: Diagnosis not present

## 2021-06-20 DIAGNOSIS — K219 Gastro-esophageal reflux disease without esophagitis: Secondary | ICD-10-CM | POA: Diagnosis not present

## 2021-06-21 DIAGNOSIS — J302 Other seasonal allergic rhinitis: Secondary | ICD-10-CM | POA: Diagnosis not present

## 2021-06-21 DIAGNOSIS — R4701 Aphasia: Secondary | ICD-10-CM | POA: Diagnosis not present

## 2021-06-21 DIAGNOSIS — C711 Malignant neoplasm of frontal lobe: Secondary | ICD-10-CM | POA: Diagnosis not present

## 2021-06-21 DIAGNOSIS — I10 Essential (primary) hypertension: Secondary | ICD-10-CM | POA: Diagnosis not present

## 2021-06-21 DIAGNOSIS — G40909 Epilepsy, unspecified, not intractable, without status epilepticus: Secondary | ICD-10-CM | POA: Diagnosis not present

## 2021-06-21 DIAGNOSIS — E119 Type 2 diabetes mellitus without complications: Secondary | ICD-10-CM | POA: Diagnosis not present

## 2021-06-21 DIAGNOSIS — R296 Repeated falls: Secondary | ICD-10-CM | POA: Diagnosis not present

## 2021-06-21 DIAGNOSIS — K219 Gastro-esophageal reflux disease without esophagitis: Secondary | ICD-10-CM | POA: Diagnosis not present

## 2021-06-21 DIAGNOSIS — E039 Hypothyroidism, unspecified: Secondary | ICD-10-CM | POA: Diagnosis not present

## 2021-06-21 DIAGNOSIS — S01112D Laceration without foreign body of left eyelid and periocular area, subsequent encounter: Secondary | ICD-10-CM | POA: Diagnosis not present

## 2021-06-21 DIAGNOSIS — E785 Hyperlipidemia, unspecified: Secondary | ICD-10-CM | POA: Diagnosis not present

## 2021-06-21 DIAGNOSIS — S42202D Unspecified fracture of upper end of left humerus, subsequent encounter for fracture with routine healing: Secondary | ICD-10-CM | POA: Diagnosis not present

## 2021-06-23 DIAGNOSIS — S42202D Unspecified fracture of upper end of left humerus, subsequent encounter for fracture with routine healing: Secondary | ICD-10-CM | POA: Diagnosis not present

## 2021-06-23 DIAGNOSIS — R4701 Aphasia: Secondary | ICD-10-CM | POA: Diagnosis not present

## 2021-06-23 DIAGNOSIS — E119 Type 2 diabetes mellitus without complications: Secondary | ICD-10-CM | POA: Diagnosis not present

## 2021-06-23 DIAGNOSIS — E785 Hyperlipidemia, unspecified: Secondary | ICD-10-CM | POA: Diagnosis not present

## 2021-06-23 DIAGNOSIS — C711 Malignant neoplasm of frontal lobe: Secondary | ICD-10-CM | POA: Diagnosis not present

## 2021-06-23 DIAGNOSIS — K219 Gastro-esophageal reflux disease without esophagitis: Secondary | ICD-10-CM | POA: Diagnosis not present

## 2021-06-24 DIAGNOSIS — S42202D Unspecified fracture of upper end of left humerus, subsequent encounter for fracture with routine healing: Secondary | ICD-10-CM | POA: Diagnosis not present

## 2021-06-24 DIAGNOSIS — C711 Malignant neoplasm of frontal lobe: Secondary | ICD-10-CM | POA: Diagnosis not present

## 2021-06-24 DIAGNOSIS — E785 Hyperlipidemia, unspecified: Secondary | ICD-10-CM | POA: Diagnosis not present

## 2021-06-24 DIAGNOSIS — R4701 Aphasia: Secondary | ICD-10-CM | POA: Diagnosis not present

## 2021-06-24 DIAGNOSIS — K219 Gastro-esophageal reflux disease without esophagitis: Secondary | ICD-10-CM | POA: Diagnosis not present

## 2021-06-24 DIAGNOSIS — E119 Type 2 diabetes mellitus without complications: Secondary | ICD-10-CM | POA: Diagnosis not present

## 2021-06-26 DIAGNOSIS — K219 Gastro-esophageal reflux disease without esophagitis: Secondary | ICD-10-CM | POA: Diagnosis not present

## 2021-06-26 DIAGNOSIS — E119 Type 2 diabetes mellitus without complications: Secondary | ICD-10-CM | POA: Diagnosis not present

## 2021-06-26 DIAGNOSIS — E785 Hyperlipidemia, unspecified: Secondary | ICD-10-CM | POA: Diagnosis not present

## 2021-06-26 DIAGNOSIS — S42202D Unspecified fracture of upper end of left humerus, subsequent encounter for fracture with routine healing: Secondary | ICD-10-CM | POA: Diagnosis not present

## 2021-06-26 DIAGNOSIS — C711 Malignant neoplasm of frontal lobe: Secondary | ICD-10-CM | POA: Diagnosis not present

## 2021-06-26 DIAGNOSIS — R4701 Aphasia: Secondary | ICD-10-CM | POA: Diagnosis not present

## 2021-06-27 DIAGNOSIS — K219 Gastro-esophageal reflux disease without esophagitis: Secondary | ICD-10-CM | POA: Diagnosis not present

## 2021-06-27 DIAGNOSIS — E119 Type 2 diabetes mellitus without complications: Secondary | ICD-10-CM | POA: Diagnosis not present

## 2021-06-27 DIAGNOSIS — S42202D Unspecified fracture of upper end of left humerus, subsequent encounter for fracture with routine healing: Secondary | ICD-10-CM | POA: Diagnosis not present

## 2021-06-27 DIAGNOSIS — R4701 Aphasia: Secondary | ICD-10-CM | POA: Diagnosis not present

## 2021-06-27 DIAGNOSIS — E785 Hyperlipidemia, unspecified: Secondary | ICD-10-CM | POA: Diagnosis not present

## 2021-06-27 DIAGNOSIS — C711 Malignant neoplasm of frontal lobe: Secondary | ICD-10-CM | POA: Diagnosis not present

## 2021-06-30 DIAGNOSIS — S42202D Unspecified fracture of upper end of left humerus, subsequent encounter for fracture with routine healing: Secondary | ICD-10-CM | POA: Diagnosis not present

## 2021-06-30 DIAGNOSIS — E119 Type 2 diabetes mellitus without complications: Secondary | ICD-10-CM | POA: Diagnosis not present

## 2021-06-30 DIAGNOSIS — E785 Hyperlipidemia, unspecified: Secondary | ICD-10-CM | POA: Diagnosis not present

## 2021-06-30 DIAGNOSIS — K219 Gastro-esophageal reflux disease without esophagitis: Secondary | ICD-10-CM | POA: Diagnosis not present

## 2021-06-30 DIAGNOSIS — C711 Malignant neoplasm of frontal lobe: Secondary | ICD-10-CM | POA: Diagnosis not present

## 2021-06-30 DIAGNOSIS — R4701 Aphasia: Secondary | ICD-10-CM | POA: Diagnosis not present

## 2021-07-03 DIAGNOSIS — E785 Hyperlipidemia, unspecified: Secondary | ICD-10-CM | POA: Diagnosis not present

## 2021-07-03 DIAGNOSIS — C711 Malignant neoplasm of frontal lobe: Secondary | ICD-10-CM | POA: Diagnosis not present

## 2021-07-03 DIAGNOSIS — E119 Type 2 diabetes mellitus without complications: Secondary | ICD-10-CM | POA: Diagnosis not present

## 2021-07-03 DIAGNOSIS — R4701 Aphasia: Secondary | ICD-10-CM | POA: Diagnosis not present

## 2021-07-03 DIAGNOSIS — S42202D Unspecified fracture of upper end of left humerus, subsequent encounter for fracture with routine healing: Secondary | ICD-10-CM | POA: Diagnosis not present

## 2021-07-03 DIAGNOSIS — K219 Gastro-esophageal reflux disease without esophagitis: Secondary | ICD-10-CM | POA: Diagnosis not present

## 2021-07-04 DIAGNOSIS — E785 Hyperlipidemia, unspecified: Secondary | ICD-10-CM | POA: Diagnosis not present

## 2021-07-04 DIAGNOSIS — K219 Gastro-esophageal reflux disease without esophagitis: Secondary | ICD-10-CM | POA: Diagnosis not present

## 2021-07-04 DIAGNOSIS — E119 Type 2 diabetes mellitus without complications: Secondary | ICD-10-CM | POA: Diagnosis not present

## 2021-07-04 DIAGNOSIS — S42202D Unspecified fracture of upper end of left humerus, subsequent encounter for fracture with routine healing: Secondary | ICD-10-CM | POA: Diagnosis not present

## 2021-07-04 DIAGNOSIS — C711 Malignant neoplasm of frontal lobe: Secondary | ICD-10-CM | POA: Diagnosis not present

## 2021-07-04 DIAGNOSIS — R4701 Aphasia: Secondary | ICD-10-CM | POA: Diagnosis not present

## 2021-07-07 DIAGNOSIS — S42202D Unspecified fracture of upper end of left humerus, subsequent encounter for fracture with routine healing: Secondary | ICD-10-CM | POA: Diagnosis not present

## 2021-07-07 DIAGNOSIS — E119 Type 2 diabetes mellitus without complications: Secondary | ICD-10-CM | POA: Diagnosis not present

## 2021-07-07 DIAGNOSIS — R4701 Aphasia: Secondary | ICD-10-CM | POA: Diagnosis not present

## 2021-07-07 DIAGNOSIS — C711 Malignant neoplasm of frontal lobe: Secondary | ICD-10-CM | POA: Diagnosis not present

## 2021-07-07 DIAGNOSIS — K219 Gastro-esophageal reflux disease without esophagitis: Secondary | ICD-10-CM | POA: Diagnosis not present

## 2021-07-07 DIAGNOSIS — E785 Hyperlipidemia, unspecified: Secondary | ICD-10-CM | POA: Diagnosis not present

## 2021-07-09 DIAGNOSIS — R4701 Aphasia: Secondary | ICD-10-CM | POA: Diagnosis not present

## 2021-07-09 DIAGNOSIS — E119 Type 2 diabetes mellitus without complications: Secondary | ICD-10-CM | POA: Diagnosis not present

## 2021-07-09 DIAGNOSIS — K219 Gastro-esophageal reflux disease without esophagitis: Secondary | ICD-10-CM | POA: Diagnosis not present

## 2021-07-09 DIAGNOSIS — C711 Malignant neoplasm of frontal lobe: Secondary | ICD-10-CM | POA: Diagnosis not present

## 2021-07-09 DIAGNOSIS — E785 Hyperlipidemia, unspecified: Secondary | ICD-10-CM | POA: Diagnosis not present

## 2021-07-09 DIAGNOSIS — S42202D Unspecified fracture of upper end of left humerus, subsequent encounter for fracture with routine healing: Secondary | ICD-10-CM | POA: Diagnosis not present

## 2021-07-10 DIAGNOSIS — C711 Malignant neoplasm of frontal lobe: Secondary | ICD-10-CM | POA: Diagnosis not present

## 2021-07-10 DIAGNOSIS — S42202D Unspecified fracture of upper end of left humerus, subsequent encounter for fracture with routine healing: Secondary | ICD-10-CM | POA: Diagnosis not present

## 2021-07-10 DIAGNOSIS — R4701 Aphasia: Secondary | ICD-10-CM | POA: Diagnosis not present

## 2021-07-10 DIAGNOSIS — K219 Gastro-esophageal reflux disease without esophagitis: Secondary | ICD-10-CM | POA: Diagnosis not present

## 2021-07-10 DIAGNOSIS — E119 Type 2 diabetes mellitus without complications: Secondary | ICD-10-CM | POA: Diagnosis not present

## 2021-07-10 DIAGNOSIS — E785 Hyperlipidemia, unspecified: Secondary | ICD-10-CM | POA: Diagnosis not present

## 2021-07-11 DIAGNOSIS — K219 Gastro-esophageal reflux disease without esophagitis: Secondary | ICD-10-CM | POA: Diagnosis not present

## 2021-07-11 DIAGNOSIS — E785 Hyperlipidemia, unspecified: Secondary | ICD-10-CM | POA: Diagnosis not present

## 2021-07-11 DIAGNOSIS — S42202D Unspecified fracture of upper end of left humerus, subsequent encounter for fracture with routine healing: Secondary | ICD-10-CM | POA: Diagnosis not present

## 2021-07-11 DIAGNOSIS — E119 Type 2 diabetes mellitus without complications: Secondary | ICD-10-CM | POA: Diagnosis not present

## 2021-07-11 DIAGNOSIS — C711 Malignant neoplasm of frontal lobe: Secondary | ICD-10-CM | POA: Diagnosis not present

## 2021-07-11 DIAGNOSIS — R4701 Aphasia: Secondary | ICD-10-CM | POA: Diagnosis not present

## 2021-07-14 DIAGNOSIS — C711 Malignant neoplasm of frontal lobe: Secondary | ICD-10-CM | POA: Diagnosis not present

## 2021-07-14 DIAGNOSIS — K219 Gastro-esophageal reflux disease without esophagitis: Secondary | ICD-10-CM | POA: Diagnosis not present

## 2021-07-14 DIAGNOSIS — R4701 Aphasia: Secondary | ICD-10-CM | POA: Diagnosis not present

## 2021-07-14 DIAGNOSIS — S42202D Unspecified fracture of upper end of left humerus, subsequent encounter for fracture with routine healing: Secondary | ICD-10-CM | POA: Diagnosis not present

## 2021-07-14 DIAGNOSIS — E119 Type 2 diabetes mellitus without complications: Secondary | ICD-10-CM | POA: Diagnosis not present

## 2021-07-14 DIAGNOSIS — E785 Hyperlipidemia, unspecified: Secondary | ICD-10-CM | POA: Diagnosis not present

## 2021-07-17 DIAGNOSIS — K219 Gastro-esophageal reflux disease without esophagitis: Secondary | ICD-10-CM | POA: Diagnosis not present

## 2021-07-17 DIAGNOSIS — S42202D Unspecified fracture of upper end of left humerus, subsequent encounter for fracture with routine healing: Secondary | ICD-10-CM | POA: Diagnosis not present

## 2021-07-17 DIAGNOSIS — R4701 Aphasia: Secondary | ICD-10-CM | POA: Diagnosis not present

## 2021-07-17 DIAGNOSIS — C711 Malignant neoplasm of frontal lobe: Secondary | ICD-10-CM | POA: Diagnosis not present

## 2021-07-17 DIAGNOSIS — E119 Type 2 diabetes mellitus without complications: Secondary | ICD-10-CM | POA: Diagnosis not present

## 2021-07-17 DIAGNOSIS — E785 Hyperlipidemia, unspecified: Secondary | ICD-10-CM | POA: Diagnosis not present

## 2021-07-18 DIAGNOSIS — C711 Malignant neoplasm of frontal lobe: Secondary | ICD-10-CM | POA: Diagnosis not present

## 2021-07-18 DIAGNOSIS — S42202D Unspecified fracture of upper end of left humerus, subsequent encounter for fracture with routine healing: Secondary | ICD-10-CM | POA: Diagnosis not present

## 2021-07-18 DIAGNOSIS — K219 Gastro-esophageal reflux disease without esophagitis: Secondary | ICD-10-CM | POA: Diagnosis not present

## 2021-07-18 DIAGNOSIS — E785 Hyperlipidemia, unspecified: Secondary | ICD-10-CM | POA: Diagnosis not present

## 2021-07-18 DIAGNOSIS — R4701 Aphasia: Secondary | ICD-10-CM | POA: Diagnosis not present

## 2021-07-18 DIAGNOSIS — E119 Type 2 diabetes mellitus without complications: Secondary | ICD-10-CM | POA: Diagnosis not present

## 2021-07-21 DIAGNOSIS — C711 Malignant neoplasm of frontal lobe: Secondary | ICD-10-CM | POA: Diagnosis not present

## 2021-07-21 DIAGNOSIS — I10 Essential (primary) hypertension: Secondary | ICD-10-CM | POA: Diagnosis not present

## 2021-07-21 DIAGNOSIS — E119 Type 2 diabetes mellitus without complications: Secondary | ICD-10-CM | POA: Diagnosis not present

## 2021-07-21 DIAGNOSIS — K219 Gastro-esophageal reflux disease without esophagitis: Secondary | ICD-10-CM | POA: Diagnosis not present

## 2021-07-21 DIAGNOSIS — J302 Other seasonal allergic rhinitis: Secondary | ICD-10-CM | POA: Diagnosis not present

## 2021-07-21 DIAGNOSIS — E039 Hypothyroidism, unspecified: Secondary | ICD-10-CM | POA: Diagnosis not present

## 2021-07-21 DIAGNOSIS — S01112D Laceration without foreign body of left eyelid and periocular area, subsequent encounter: Secondary | ICD-10-CM | POA: Diagnosis not present

## 2021-07-21 DIAGNOSIS — R296 Repeated falls: Secondary | ICD-10-CM | POA: Diagnosis not present

## 2021-07-21 DIAGNOSIS — E785 Hyperlipidemia, unspecified: Secondary | ICD-10-CM | POA: Diagnosis not present

## 2021-07-21 DIAGNOSIS — G40909 Epilepsy, unspecified, not intractable, without status epilepticus: Secondary | ICD-10-CM | POA: Diagnosis not present

## 2021-07-21 DIAGNOSIS — S42202D Unspecified fracture of upper end of left humerus, subsequent encounter for fracture with routine healing: Secondary | ICD-10-CM | POA: Diagnosis not present

## 2021-07-21 DIAGNOSIS — R4701 Aphasia: Secondary | ICD-10-CM | POA: Diagnosis not present

## 2021-07-23 DIAGNOSIS — S42202D Unspecified fracture of upper end of left humerus, subsequent encounter for fracture with routine healing: Secondary | ICD-10-CM | POA: Diagnosis not present

## 2021-07-23 DIAGNOSIS — R4701 Aphasia: Secondary | ICD-10-CM | POA: Diagnosis not present

## 2021-07-23 DIAGNOSIS — E785 Hyperlipidemia, unspecified: Secondary | ICD-10-CM | POA: Diagnosis not present

## 2021-07-23 DIAGNOSIS — C711 Malignant neoplasm of frontal lobe: Secondary | ICD-10-CM | POA: Diagnosis not present

## 2021-07-23 DIAGNOSIS — K219 Gastro-esophageal reflux disease without esophagitis: Secondary | ICD-10-CM | POA: Diagnosis not present

## 2021-07-23 DIAGNOSIS — E119 Type 2 diabetes mellitus without complications: Secondary | ICD-10-CM | POA: Diagnosis not present

## 2021-07-25 DIAGNOSIS — E785 Hyperlipidemia, unspecified: Secondary | ICD-10-CM | POA: Diagnosis not present

## 2021-07-25 DIAGNOSIS — R4701 Aphasia: Secondary | ICD-10-CM | POA: Diagnosis not present

## 2021-07-25 DIAGNOSIS — C711 Malignant neoplasm of frontal lobe: Secondary | ICD-10-CM | POA: Diagnosis not present

## 2021-07-25 DIAGNOSIS — E119 Type 2 diabetes mellitus without complications: Secondary | ICD-10-CM | POA: Diagnosis not present

## 2021-07-25 DIAGNOSIS — K219 Gastro-esophageal reflux disease without esophagitis: Secondary | ICD-10-CM | POA: Diagnosis not present

## 2021-07-25 DIAGNOSIS — S42202D Unspecified fracture of upper end of left humerus, subsequent encounter for fracture with routine healing: Secondary | ICD-10-CM | POA: Diagnosis not present

## 2021-07-28 DIAGNOSIS — S42202D Unspecified fracture of upper end of left humerus, subsequent encounter for fracture with routine healing: Secondary | ICD-10-CM | POA: Diagnosis not present

## 2021-07-28 DIAGNOSIS — R4701 Aphasia: Secondary | ICD-10-CM | POA: Diagnosis not present

## 2021-07-28 DIAGNOSIS — K219 Gastro-esophageal reflux disease without esophagitis: Secondary | ICD-10-CM | POA: Diagnosis not present

## 2021-07-28 DIAGNOSIS — E785 Hyperlipidemia, unspecified: Secondary | ICD-10-CM | POA: Diagnosis not present

## 2021-07-28 DIAGNOSIS — E119 Type 2 diabetes mellitus without complications: Secondary | ICD-10-CM | POA: Diagnosis not present

## 2021-07-28 DIAGNOSIS — C711 Malignant neoplasm of frontal lobe: Secondary | ICD-10-CM | POA: Diagnosis not present

## 2021-07-29 DIAGNOSIS — S42202A Unspecified fracture of upper end of left humerus, initial encounter for closed fracture: Secondary | ICD-10-CM | POA: Diagnosis not present

## 2021-07-31 ENCOUNTER — Other Ambulatory Visit: Payer: Self-pay | Admitting: *Deleted

## 2021-07-31 DIAGNOSIS — K219 Gastro-esophageal reflux disease without esophagitis: Secondary | ICD-10-CM | POA: Diagnosis not present

## 2021-07-31 DIAGNOSIS — C711 Malignant neoplasm of frontal lobe: Secondary | ICD-10-CM | POA: Diagnosis not present

## 2021-07-31 DIAGNOSIS — E119 Type 2 diabetes mellitus without complications: Secondary | ICD-10-CM | POA: Diagnosis not present

## 2021-07-31 DIAGNOSIS — R4701 Aphasia: Secondary | ICD-10-CM | POA: Diagnosis not present

## 2021-07-31 DIAGNOSIS — E785 Hyperlipidemia, unspecified: Secondary | ICD-10-CM | POA: Diagnosis not present

## 2021-07-31 DIAGNOSIS — S42202D Unspecified fracture of upper end of left humerus, subsequent encounter for fracture with routine healing: Secondary | ICD-10-CM | POA: Diagnosis not present

## 2021-07-31 MED ORDER — LEVETIRACETAM 100 MG/ML PO SOLN: ORAL | 1 refills | Status: DC

## 2021-08-01 DIAGNOSIS — K219 Gastro-esophageal reflux disease without esophagitis: Secondary | ICD-10-CM | POA: Diagnosis not present

## 2021-08-01 DIAGNOSIS — E785 Hyperlipidemia, unspecified: Secondary | ICD-10-CM | POA: Diagnosis not present

## 2021-08-01 DIAGNOSIS — S42202D Unspecified fracture of upper end of left humerus, subsequent encounter for fracture with routine healing: Secondary | ICD-10-CM | POA: Diagnosis not present

## 2021-08-01 DIAGNOSIS — E119 Type 2 diabetes mellitus without complications: Secondary | ICD-10-CM | POA: Diagnosis not present

## 2021-08-01 DIAGNOSIS — C711 Malignant neoplasm of frontal lobe: Secondary | ICD-10-CM | POA: Diagnosis not present

## 2021-08-01 DIAGNOSIS — R4701 Aphasia: Secondary | ICD-10-CM | POA: Diagnosis not present

## 2021-08-04 DIAGNOSIS — E119 Type 2 diabetes mellitus without complications: Secondary | ICD-10-CM | POA: Diagnosis not present

## 2021-08-04 DIAGNOSIS — E785 Hyperlipidemia, unspecified: Secondary | ICD-10-CM | POA: Diagnosis not present

## 2021-08-04 DIAGNOSIS — K219 Gastro-esophageal reflux disease without esophagitis: Secondary | ICD-10-CM | POA: Diagnosis not present

## 2021-08-04 DIAGNOSIS — R4701 Aphasia: Secondary | ICD-10-CM | POA: Diagnosis not present

## 2021-08-04 DIAGNOSIS — S42202D Unspecified fracture of upper end of left humerus, subsequent encounter for fracture with routine healing: Secondary | ICD-10-CM | POA: Diagnosis not present

## 2021-08-04 DIAGNOSIS — C711 Malignant neoplasm of frontal lobe: Secondary | ICD-10-CM | POA: Diagnosis not present

## 2021-08-07 DIAGNOSIS — C711 Malignant neoplasm of frontal lobe: Secondary | ICD-10-CM | POA: Diagnosis not present

## 2021-08-07 DIAGNOSIS — E785 Hyperlipidemia, unspecified: Secondary | ICD-10-CM | POA: Diagnosis not present

## 2021-08-07 DIAGNOSIS — S42202D Unspecified fracture of upper end of left humerus, subsequent encounter for fracture with routine healing: Secondary | ICD-10-CM | POA: Diagnosis not present

## 2021-08-07 DIAGNOSIS — R4701 Aphasia: Secondary | ICD-10-CM | POA: Diagnosis not present

## 2021-08-07 DIAGNOSIS — E119 Type 2 diabetes mellitus without complications: Secondary | ICD-10-CM | POA: Diagnosis not present

## 2021-08-07 DIAGNOSIS — K219 Gastro-esophageal reflux disease without esophagitis: Secondary | ICD-10-CM | POA: Diagnosis not present

## 2021-08-08 DIAGNOSIS — E119 Type 2 diabetes mellitus without complications: Secondary | ICD-10-CM | POA: Diagnosis not present

## 2021-08-08 DIAGNOSIS — K219 Gastro-esophageal reflux disease without esophagitis: Secondary | ICD-10-CM | POA: Diagnosis not present

## 2021-08-08 DIAGNOSIS — S42202D Unspecified fracture of upper end of left humerus, subsequent encounter for fracture with routine healing: Secondary | ICD-10-CM | POA: Diagnosis not present

## 2021-08-08 DIAGNOSIS — E785 Hyperlipidemia, unspecified: Secondary | ICD-10-CM | POA: Diagnosis not present

## 2021-08-08 DIAGNOSIS — C711 Malignant neoplasm of frontal lobe: Secondary | ICD-10-CM | POA: Diagnosis not present

## 2021-08-08 DIAGNOSIS — R4701 Aphasia: Secondary | ICD-10-CM | POA: Diagnosis not present

## 2021-08-11 DIAGNOSIS — E119 Type 2 diabetes mellitus without complications: Secondary | ICD-10-CM | POA: Diagnosis not present

## 2021-08-11 DIAGNOSIS — R4701 Aphasia: Secondary | ICD-10-CM | POA: Diagnosis not present

## 2021-08-11 DIAGNOSIS — E785 Hyperlipidemia, unspecified: Secondary | ICD-10-CM | POA: Diagnosis not present

## 2021-08-11 DIAGNOSIS — K219 Gastro-esophageal reflux disease without esophagitis: Secondary | ICD-10-CM | POA: Diagnosis not present

## 2021-08-11 DIAGNOSIS — S42202D Unspecified fracture of upper end of left humerus, subsequent encounter for fracture with routine healing: Secondary | ICD-10-CM | POA: Diagnosis not present

## 2021-08-11 DIAGNOSIS — C711 Malignant neoplasm of frontal lobe: Secondary | ICD-10-CM | POA: Diagnosis not present

## 2021-08-14 DIAGNOSIS — E785 Hyperlipidemia, unspecified: Secondary | ICD-10-CM | POA: Diagnosis not present

## 2021-08-14 DIAGNOSIS — K219 Gastro-esophageal reflux disease without esophagitis: Secondary | ICD-10-CM | POA: Diagnosis not present

## 2021-08-14 DIAGNOSIS — E119 Type 2 diabetes mellitus without complications: Secondary | ICD-10-CM | POA: Diagnosis not present

## 2021-08-14 DIAGNOSIS — C711 Malignant neoplasm of frontal lobe: Secondary | ICD-10-CM | POA: Diagnosis not present

## 2021-08-14 DIAGNOSIS — S42202D Unspecified fracture of upper end of left humerus, subsequent encounter for fracture with routine healing: Secondary | ICD-10-CM | POA: Diagnosis not present

## 2021-08-14 DIAGNOSIS — R4701 Aphasia: Secondary | ICD-10-CM | POA: Diagnosis not present

## 2021-08-15 DIAGNOSIS — R4701 Aphasia: Secondary | ICD-10-CM | POA: Diagnosis not present

## 2021-08-15 DIAGNOSIS — E119 Type 2 diabetes mellitus without complications: Secondary | ICD-10-CM | POA: Diagnosis not present

## 2021-08-15 DIAGNOSIS — C711 Malignant neoplasm of frontal lobe: Secondary | ICD-10-CM | POA: Diagnosis not present

## 2021-08-15 DIAGNOSIS — K219 Gastro-esophageal reflux disease without esophagitis: Secondary | ICD-10-CM | POA: Diagnosis not present

## 2021-08-15 DIAGNOSIS — S42202D Unspecified fracture of upper end of left humerus, subsequent encounter for fracture with routine healing: Secondary | ICD-10-CM | POA: Diagnosis not present

## 2021-08-15 DIAGNOSIS — E785 Hyperlipidemia, unspecified: Secondary | ICD-10-CM | POA: Diagnosis not present

## 2021-08-18 DIAGNOSIS — E785 Hyperlipidemia, unspecified: Secondary | ICD-10-CM | POA: Diagnosis not present

## 2021-08-18 DIAGNOSIS — K219 Gastro-esophageal reflux disease without esophagitis: Secondary | ICD-10-CM | POA: Diagnosis not present

## 2021-08-18 DIAGNOSIS — C711 Malignant neoplasm of frontal lobe: Secondary | ICD-10-CM | POA: Diagnosis not present

## 2021-08-18 DIAGNOSIS — S42202D Unspecified fracture of upper end of left humerus, subsequent encounter for fracture with routine healing: Secondary | ICD-10-CM | POA: Diagnosis not present

## 2021-08-18 DIAGNOSIS — R4701 Aphasia: Secondary | ICD-10-CM | POA: Diagnosis not present

## 2021-08-18 DIAGNOSIS — E119 Type 2 diabetes mellitus without complications: Secondary | ICD-10-CM | POA: Diagnosis not present

## 2021-08-20 DIAGNOSIS — R4701 Aphasia: Secondary | ICD-10-CM | POA: Diagnosis not present

## 2021-08-20 DIAGNOSIS — K219 Gastro-esophageal reflux disease without esophagitis: Secondary | ICD-10-CM | POA: Diagnosis not present

## 2021-08-20 DIAGNOSIS — S42202D Unspecified fracture of upper end of left humerus, subsequent encounter for fracture with routine healing: Secondary | ICD-10-CM | POA: Diagnosis not present

## 2021-08-20 DIAGNOSIS — E785 Hyperlipidemia, unspecified: Secondary | ICD-10-CM | POA: Diagnosis not present

## 2021-08-20 DIAGNOSIS — E119 Type 2 diabetes mellitus without complications: Secondary | ICD-10-CM | POA: Diagnosis not present

## 2021-08-20 DIAGNOSIS — C711 Malignant neoplasm of frontal lobe: Secondary | ICD-10-CM | POA: Diagnosis not present

## 2021-08-21 DIAGNOSIS — E039 Hypothyroidism, unspecified: Secondary | ICD-10-CM | POA: Diagnosis not present

## 2021-08-21 DIAGNOSIS — J302 Other seasonal allergic rhinitis: Secondary | ICD-10-CM | POA: Diagnosis not present

## 2021-08-21 DIAGNOSIS — K219 Gastro-esophageal reflux disease without esophagitis: Secondary | ICD-10-CM | POA: Diagnosis not present

## 2021-08-21 DIAGNOSIS — R296 Repeated falls: Secondary | ICD-10-CM | POA: Diagnosis not present

## 2021-08-21 DIAGNOSIS — C711 Malignant neoplasm of frontal lobe: Secondary | ICD-10-CM | POA: Diagnosis not present

## 2021-08-21 DIAGNOSIS — E119 Type 2 diabetes mellitus without complications: Secondary | ICD-10-CM | POA: Diagnosis not present

## 2021-08-21 DIAGNOSIS — R4701 Aphasia: Secondary | ICD-10-CM | POA: Diagnosis not present

## 2021-08-21 DIAGNOSIS — E785 Hyperlipidemia, unspecified: Secondary | ICD-10-CM | POA: Diagnosis not present

## 2021-08-21 DIAGNOSIS — S42202D Unspecified fracture of upper end of left humerus, subsequent encounter for fracture with routine healing: Secondary | ICD-10-CM | POA: Diagnosis not present

## 2021-08-21 DIAGNOSIS — G40909 Epilepsy, unspecified, not intractable, without status epilepticus: Secondary | ICD-10-CM | POA: Diagnosis not present

## 2021-08-21 DIAGNOSIS — S01112D Laceration without foreign body of left eyelid and periocular area, subsequent encounter: Secondary | ICD-10-CM | POA: Diagnosis not present

## 2021-08-21 DIAGNOSIS — I10 Essential (primary) hypertension: Secondary | ICD-10-CM | POA: Diagnosis not present

## 2021-08-22 DIAGNOSIS — K219 Gastro-esophageal reflux disease without esophagitis: Secondary | ICD-10-CM | POA: Diagnosis not present

## 2021-08-22 DIAGNOSIS — S42202D Unspecified fracture of upper end of left humerus, subsequent encounter for fracture with routine healing: Secondary | ICD-10-CM | POA: Diagnosis not present

## 2021-08-22 DIAGNOSIS — E119 Type 2 diabetes mellitus without complications: Secondary | ICD-10-CM | POA: Diagnosis not present

## 2021-08-22 DIAGNOSIS — E785 Hyperlipidemia, unspecified: Secondary | ICD-10-CM | POA: Diagnosis not present

## 2021-08-22 DIAGNOSIS — C711 Malignant neoplasm of frontal lobe: Secondary | ICD-10-CM | POA: Diagnosis not present

## 2021-08-22 DIAGNOSIS — R4701 Aphasia: Secondary | ICD-10-CM | POA: Diagnosis not present

## 2021-08-25 DIAGNOSIS — E785 Hyperlipidemia, unspecified: Secondary | ICD-10-CM | POA: Diagnosis not present

## 2021-08-25 DIAGNOSIS — R4701 Aphasia: Secondary | ICD-10-CM | POA: Diagnosis not present

## 2021-08-25 DIAGNOSIS — S42202D Unspecified fracture of upper end of left humerus, subsequent encounter for fracture with routine healing: Secondary | ICD-10-CM | POA: Diagnosis not present

## 2021-08-25 DIAGNOSIS — C711 Malignant neoplasm of frontal lobe: Secondary | ICD-10-CM | POA: Diagnosis not present

## 2021-08-25 DIAGNOSIS — E119 Type 2 diabetes mellitus without complications: Secondary | ICD-10-CM | POA: Diagnosis not present

## 2021-08-25 DIAGNOSIS — K219 Gastro-esophageal reflux disease without esophagitis: Secondary | ICD-10-CM | POA: Diagnosis not present

## 2021-08-28 DIAGNOSIS — C711 Malignant neoplasm of frontal lobe: Secondary | ICD-10-CM | POA: Diagnosis not present

## 2021-08-28 DIAGNOSIS — E785 Hyperlipidemia, unspecified: Secondary | ICD-10-CM | POA: Diagnosis not present

## 2021-08-28 DIAGNOSIS — K219 Gastro-esophageal reflux disease without esophagitis: Secondary | ICD-10-CM | POA: Diagnosis not present

## 2021-08-28 DIAGNOSIS — R4701 Aphasia: Secondary | ICD-10-CM | POA: Diagnosis not present

## 2021-08-28 DIAGNOSIS — S42202D Unspecified fracture of upper end of left humerus, subsequent encounter for fracture with routine healing: Secondary | ICD-10-CM | POA: Diagnosis not present

## 2021-08-28 DIAGNOSIS — E119 Type 2 diabetes mellitus without complications: Secondary | ICD-10-CM | POA: Diagnosis not present

## 2021-08-29 DIAGNOSIS — R4701 Aphasia: Secondary | ICD-10-CM | POA: Diagnosis not present

## 2021-08-29 DIAGNOSIS — S42202D Unspecified fracture of upper end of left humerus, subsequent encounter for fracture with routine healing: Secondary | ICD-10-CM | POA: Diagnosis not present

## 2021-08-29 DIAGNOSIS — K219 Gastro-esophageal reflux disease without esophagitis: Secondary | ICD-10-CM | POA: Diagnosis not present

## 2021-08-29 DIAGNOSIS — E785 Hyperlipidemia, unspecified: Secondary | ICD-10-CM | POA: Diagnosis not present

## 2021-08-29 DIAGNOSIS — C711 Malignant neoplasm of frontal lobe: Secondary | ICD-10-CM | POA: Diagnosis not present

## 2021-08-29 DIAGNOSIS — E119 Type 2 diabetes mellitus without complications: Secondary | ICD-10-CM | POA: Diagnosis not present

## 2021-09-01 DIAGNOSIS — R4701 Aphasia: Secondary | ICD-10-CM | POA: Diagnosis not present

## 2021-09-01 DIAGNOSIS — K219 Gastro-esophageal reflux disease without esophagitis: Secondary | ICD-10-CM | POA: Diagnosis not present

## 2021-09-01 DIAGNOSIS — E119 Type 2 diabetes mellitus without complications: Secondary | ICD-10-CM | POA: Diagnosis not present

## 2021-09-01 DIAGNOSIS — E785 Hyperlipidemia, unspecified: Secondary | ICD-10-CM | POA: Diagnosis not present

## 2021-09-01 DIAGNOSIS — C711 Malignant neoplasm of frontal lobe: Secondary | ICD-10-CM | POA: Diagnosis not present

## 2021-09-01 DIAGNOSIS — S42202D Unspecified fracture of upper end of left humerus, subsequent encounter for fracture with routine healing: Secondary | ICD-10-CM | POA: Diagnosis not present

## 2021-09-03 DIAGNOSIS — K219 Gastro-esophageal reflux disease without esophagitis: Secondary | ICD-10-CM | POA: Diagnosis not present

## 2021-09-03 DIAGNOSIS — R4701 Aphasia: Secondary | ICD-10-CM | POA: Diagnosis not present

## 2021-09-03 DIAGNOSIS — S42202D Unspecified fracture of upper end of left humerus, subsequent encounter for fracture with routine healing: Secondary | ICD-10-CM | POA: Diagnosis not present

## 2021-09-03 DIAGNOSIS — E119 Type 2 diabetes mellitus without complications: Secondary | ICD-10-CM | POA: Diagnosis not present

## 2021-09-03 DIAGNOSIS — C711 Malignant neoplasm of frontal lobe: Secondary | ICD-10-CM | POA: Diagnosis not present

## 2021-09-03 DIAGNOSIS — E785 Hyperlipidemia, unspecified: Secondary | ICD-10-CM | POA: Diagnosis not present

## 2021-09-04 DIAGNOSIS — S42202D Unspecified fracture of upper end of left humerus, subsequent encounter for fracture with routine healing: Secondary | ICD-10-CM | POA: Diagnosis not present

## 2021-09-04 DIAGNOSIS — K219 Gastro-esophageal reflux disease without esophagitis: Secondary | ICD-10-CM | POA: Diagnosis not present

## 2021-09-04 DIAGNOSIS — E119 Type 2 diabetes mellitus without complications: Secondary | ICD-10-CM | POA: Diagnosis not present

## 2021-09-04 DIAGNOSIS — R4701 Aphasia: Secondary | ICD-10-CM | POA: Diagnosis not present

## 2021-09-04 DIAGNOSIS — E785 Hyperlipidemia, unspecified: Secondary | ICD-10-CM | POA: Diagnosis not present

## 2021-09-04 DIAGNOSIS — C711 Malignant neoplasm of frontal lobe: Secondary | ICD-10-CM | POA: Diagnosis not present

## 2021-09-05 DIAGNOSIS — R4701 Aphasia: Secondary | ICD-10-CM | POA: Diagnosis not present

## 2021-09-05 DIAGNOSIS — E785 Hyperlipidemia, unspecified: Secondary | ICD-10-CM | POA: Diagnosis not present

## 2021-09-05 DIAGNOSIS — K219 Gastro-esophageal reflux disease without esophagitis: Secondary | ICD-10-CM | POA: Diagnosis not present

## 2021-09-05 DIAGNOSIS — S42202D Unspecified fracture of upper end of left humerus, subsequent encounter for fracture with routine healing: Secondary | ICD-10-CM | POA: Diagnosis not present

## 2021-09-05 DIAGNOSIS — E119 Type 2 diabetes mellitus without complications: Secondary | ICD-10-CM | POA: Diagnosis not present

## 2021-09-05 DIAGNOSIS — C711 Malignant neoplasm of frontal lobe: Secondary | ICD-10-CM | POA: Diagnosis not present

## 2021-09-08 DIAGNOSIS — R4701 Aphasia: Secondary | ICD-10-CM | POA: Diagnosis not present

## 2021-09-08 DIAGNOSIS — E785 Hyperlipidemia, unspecified: Secondary | ICD-10-CM | POA: Diagnosis not present

## 2021-09-08 DIAGNOSIS — S42202D Unspecified fracture of upper end of left humerus, subsequent encounter for fracture with routine healing: Secondary | ICD-10-CM | POA: Diagnosis not present

## 2021-09-08 DIAGNOSIS — E119 Type 2 diabetes mellitus without complications: Secondary | ICD-10-CM | POA: Diagnosis not present

## 2021-09-08 DIAGNOSIS — C711 Malignant neoplasm of frontal lobe: Secondary | ICD-10-CM | POA: Diagnosis not present

## 2021-09-08 DIAGNOSIS — K219 Gastro-esophageal reflux disease without esophagitis: Secondary | ICD-10-CM | POA: Diagnosis not present

## 2021-09-10 DIAGNOSIS — S42202D Unspecified fracture of upper end of left humerus, subsequent encounter for fracture with routine healing: Secondary | ICD-10-CM | POA: Diagnosis not present

## 2021-09-10 DIAGNOSIS — R4701 Aphasia: Secondary | ICD-10-CM | POA: Diagnosis not present

## 2021-09-10 DIAGNOSIS — C711 Malignant neoplasm of frontal lobe: Secondary | ICD-10-CM | POA: Diagnosis not present

## 2021-09-10 DIAGNOSIS — E119 Type 2 diabetes mellitus without complications: Secondary | ICD-10-CM | POA: Diagnosis not present

## 2021-09-10 DIAGNOSIS — E785 Hyperlipidemia, unspecified: Secondary | ICD-10-CM | POA: Diagnosis not present

## 2021-09-10 DIAGNOSIS — K219 Gastro-esophageal reflux disease without esophagitis: Secondary | ICD-10-CM | POA: Diagnosis not present

## 2021-09-11 DIAGNOSIS — E785 Hyperlipidemia, unspecified: Secondary | ICD-10-CM | POA: Diagnosis not present

## 2021-09-11 DIAGNOSIS — R4701 Aphasia: Secondary | ICD-10-CM | POA: Diagnosis not present

## 2021-09-11 DIAGNOSIS — K219 Gastro-esophageal reflux disease without esophagitis: Secondary | ICD-10-CM | POA: Diagnosis not present

## 2021-09-11 DIAGNOSIS — E119 Type 2 diabetes mellitus without complications: Secondary | ICD-10-CM | POA: Diagnosis not present

## 2021-09-11 DIAGNOSIS — S42202D Unspecified fracture of upper end of left humerus, subsequent encounter for fracture with routine healing: Secondary | ICD-10-CM | POA: Diagnosis not present

## 2021-09-11 DIAGNOSIS — C711 Malignant neoplasm of frontal lobe: Secondary | ICD-10-CM | POA: Diagnosis not present

## 2021-09-12 DIAGNOSIS — S42202D Unspecified fracture of upper end of left humerus, subsequent encounter for fracture with routine healing: Secondary | ICD-10-CM | POA: Diagnosis not present

## 2021-09-12 DIAGNOSIS — E785 Hyperlipidemia, unspecified: Secondary | ICD-10-CM | POA: Diagnosis not present

## 2021-09-12 DIAGNOSIS — R4701 Aphasia: Secondary | ICD-10-CM | POA: Diagnosis not present

## 2021-09-12 DIAGNOSIS — E119 Type 2 diabetes mellitus without complications: Secondary | ICD-10-CM | POA: Diagnosis not present

## 2021-09-12 DIAGNOSIS — C711 Malignant neoplasm of frontal lobe: Secondary | ICD-10-CM | POA: Diagnosis not present

## 2021-09-12 DIAGNOSIS — K219 Gastro-esophageal reflux disease without esophagitis: Secondary | ICD-10-CM | POA: Diagnosis not present

## 2021-09-15 DIAGNOSIS — R4701 Aphasia: Secondary | ICD-10-CM | POA: Diagnosis not present

## 2021-09-15 DIAGNOSIS — S42202D Unspecified fracture of upper end of left humerus, subsequent encounter for fracture with routine healing: Secondary | ICD-10-CM | POA: Diagnosis not present

## 2021-09-15 DIAGNOSIS — E785 Hyperlipidemia, unspecified: Secondary | ICD-10-CM | POA: Diagnosis not present

## 2021-09-15 DIAGNOSIS — C711 Malignant neoplasm of frontal lobe: Secondary | ICD-10-CM | POA: Diagnosis not present

## 2021-09-15 DIAGNOSIS — K219 Gastro-esophageal reflux disease without esophagitis: Secondary | ICD-10-CM | POA: Diagnosis not present

## 2021-09-15 DIAGNOSIS — E119 Type 2 diabetes mellitus without complications: Secondary | ICD-10-CM | POA: Diagnosis not present

## 2021-09-17 DIAGNOSIS — E785 Hyperlipidemia, unspecified: Secondary | ICD-10-CM | POA: Diagnosis not present

## 2021-09-17 DIAGNOSIS — R4701 Aphasia: Secondary | ICD-10-CM | POA: Diagnosis not present

## 2021-09-17 DIAGNOSIS — C711 Malignant neoplasm of frontal lobe: Secondary | ICD-10-CM | POA: Diagnosis not present

## 2021-09-17 DIAGNOSIS — K219 Gastro-esophageal reflux disease without esophagitis: Secondary | ICD-10-CM | POA: Diagnosis not present

## 2021-09-17 DIAGNOSIS — S42202D Unspecified fracture of upper end of left humerus, subsequent encounter for fracture with routine healing: Secondary | ICD-10-CM | POA: Diagnosis not present

## 2021-09-17 DIAGNOSIS — E119 Type 2 diabetes mellitus without complications: Secondary | ICD-10-CM | POA: Diagnosis not present

## 2021-09-18 DIAGNOSIS — R4701 Aphasia: Secondary | ICD-10-CM | POA: Diagnosis not present

## 2021-09-18 DIAGNOSIS — E785 Hyperlipidemia, unspecified: Secondary | ICD-10-CM | POA: Diagnosis not present

## 2021-09-18 DIAGNOSIS — K219 Gastro-esophageal reflux disease without esophagitis: Secondary | ICD-10-CM | POA: Diagnosis not present

## 2021-09-18 DIAGNOSIS — E119 Type 2 diabetes mellitus without complications: Secondary | ICD-10-CM | POA: Diagnosis not present

## 2021-09-18 DIAGNOSIS — C711 Malignant neoplasm of frontal lobe: Secondary | ICD-10-CM | POA: Diagnosis not present

## 2021-09-18 DIAGNOSIS — S42202D Unspecified fracture of upper end of left humerus, subsequent encounter for fracture with routine healing: Secondary | ICD-10-CM | POA: Diagnosis not present

## 2021-09-19 DIAGNOSIS — E119 Type 2 diabetes mellitus without complications: Secondary | ICD-10-CM | POA: Diagnosis not present

## 2021-09-19 DIAGNOSIS — E785 Hyperlipidemia, unspecified: Secondary | ICD-10-CM | POA: Diagnosis not present

## 2021-09-19 DIAGNOSIS — S42202D Unspecified fracture of upper end of left humerus, subsequent encounter for fracture with routine healing: Secondary | ICD-10-CM | POA: Diagnosis not present

## 2021-09-19 DIAGNOSIS — K219 Gastro-esophageal reflux disease without esophagitis: Secondary | ICD-10-CM | POA: Diagnosis not present

## 2021-09-19 DIAGNOSIS — C711 Malignant neoplasm of frontal lobe: Secondary | ICD-10-CM | POA: Diagnosis not present

## 2021-09-19 DIAGNOSIS — R4701 Aphasia: Secondary | ICD-10-CM | POA: Diagnosis not present

## 2021-09-20 DIAGNOSIS — E119 Type 2 diabetes mellitus without complications: Secondary | ICD-10-CM | POA: Diagnosis not present

## 2021-09-20 DIAGNOSIS — R4701 Aphasia: Secondary | ICD-10-CM | POA: Diagnosis not present

## 2021-09-20 DIAGNOSIS — K219 Gastro-esophageal reflux disease without esophagitis: Secondary | ICD-10-CM | POA: Diagnosis not present

## 2021-09-20 DIAGNOSIS — I10 Essential (primary) hypertension: Secondary | ICD-10-CM | POA: Diagnosis not present

## 2021-09-20 DIAGNOSIS — E785 Hyperlipidemia, unspecified: Secondary | ICD-10-CM | POA: Diagnosis not present

## 2021-09-20 DIAGNOSIS — S42202D Unspecified fracture of upper end of left humerus, subsequent encounter for fracture with routine healing: Secondary | ICD-10-CM | POA: Diagnosis not present

## 2021-09-20 DIAGNOSIS — R159 Full incontinence of feces: Secondary | ICD-10-CM | POA: Diagnosis not present

## 2021-09-20 DIAGNOSIS — R296 Repeated falls: Secondary | ICD-10-CM | POA: Diagnosis not present

## 2021-09-20 DIAGNOSIS — G8194 Hemiplegia, unspecified affecting left nondominant side: Secondary | ICD-10-CM | POA: Diagnosis not present

## 2021-09-20 DIAGNOSIS — E039 Hypothyroidism, unspecified: Secondary | ICD-10-CM | POA: Diagnosis not present

## 2021-09-20 DIAGNOSIS — C711 Malignant neoplasm of frontal lobe: Secondary | ICD-10-CM | POA: Diagnosis not present

## 2021-09-20 DIAGNOSIS — S01112D Laceration without foreign body of left eyelid and periocular area, subsequent encounter: Secondary | ICD-10-CM | POA: Diagnosis not present

## 2021-09-20 DIAGNOSIS — J302 Other seasonal allergic rhinitis: Secondary | ICD-10-CM | POA: Diagnosis not present

## 2021-09-20 DIAGNOSIS — G40909 Epilepsy, unspecified, not intractable, without status epilepticus: Secondary | ICD-10-CM | POA: Diagnosis not present

## 2021-09-22 DIAGNOSIS — S42202D Unspecified fracture of upper end of left humerus, subsequent encounter for fracture with routine healing: Secondary | ICD-10-CM | POA: Diagnosis not present

## 2021-09-22 DIAGNOSIS — K219 Gastro-esophageal reflux disease without esophagitis: Secondary | ICD-10-CM | POA: Diagnosis not present

## 2021-09-22 DIAGNOSIS — R4701 Aphasia: Secondary | ICD-10-CM | POA: Diagnosis not present

## 2021-09-22 DIAGNOSIS — E119 Type 2 diabetes mellitus without complications: Secondary | ICD-10-CM | POA: Diagnosis not present

## 2021-09-22 DIAGNOSIS — E785 Hyperlipidemia, unspecified: Secondary | ICD-10-CM | POA: Diagnosis not present

## 2021-09-22 DIAGNOSIS — C711 Malignant neoplasm of frontal lobe: Secondary | ICD-10-CM | POA: Diagnosis not present

## 2021-09-24 DIAGNOSIS — R4701 Aphasia: Secondary | ICD-10-CM | POA: Diagnosis not present

## 2021-09-24 DIAGNOSIS — E119 Type 2 diabetes mellitus without complications: Secondary | ICD-10-CM | POA: Diagnosis not present

## 2021-09-24 DIAGNOSIS — E785 Hyperlipidemia, unspecified: Secondary | ICD-10-CM | POA: Diagnosis not present

## 2021-09-24 DIAGNOSIS — K219 Gastro-esophageal reflux disease without esophagitis: Secondary | ICD-10-CM | POA: Diagnosis not present

## 2021-09-24 DIAGNOSIS — C711 Malignant neoplasm of frontal lobe: Secondary | ICD-10-CM | POA: Diagnosis not present

## 2021-09-24 DIAGNOSIS — S42202D Unspecified fracture of upper end of left humerus, subsequent encounter for fracture with routine healing: Secondary | ICD-10-CM | POA: Diagnosis not present

## 2021-09-26 DIAGNOSIS — E119 Type 2 diabetes mellitus without complications: Secondary | ICD-10-CM | POA: Diagnosis not present

## 2021-09-26 DIAGNOSIS — K219 Gastro-esophageal reflux disease without esophagitis: Secondary | ICD-10-CM | POA: Diagnosis not present

## 2021-09-26 DIAGNOSIS — R4701 Aphasia: Secondary | ICD-10-CM | POA: Diagnosis not present

## 2021-09-26 DIAGNOSIS — C711 Malignant neoplasm of frontal lobe: Secondary | ICD-10-CM | POA: Diagnosis not present

## 2021-09-26 DIAGNOSIS — E785 Hyperlipidemia, unspecified: Secondary | ICD-10-CM | POA: Diagnosis not present

## 2021-09-26 DIAGNOSIS — S42202D Unspecified fracture of upper end of left humerus, subsequent encounter for fracture with routine healing: Secondary | ICD-10-CM | POA: Diagnosis not present

## 2021-09-29 DIAGNOSIS — E119 Type 2 diabetes mellitus without complications: Secondary | ICD-10-CM | POA: Diagnosis not present

## 2021-09-29 DIAGNOSIS — S42202D Unspecified fracture of upper end of left humerus, subsequent encounter for fracture with routine healing: Secondary | ICD-10-CM | POA: Diagnosis not present

## 2021-09-29 DIAGNOSIS — K219 Gastro-esophageal reflux disease without esophagitis: Secondary | ICD-10-CM | POA: Diagnosis not present

## 2021-09-29 DIAGNOSIS — R4701 Aphasia: Secondary | ICD-10-CM | POA: Diagnosis not present

## 2021-09-29 DIAGNOSIS — E785 Hyperlipidemia, unspecified: Secondary | ICD-10-CM | POA: Diagnosis not present

## 2021-09-29 DIAGNOSIS — C711 Malignant neoplasm of frontal lobe: Secondary | ICD-10-CM | POA: Diagnosis not present

## 2021-10-01 DIAGNOSIS — E119 Type 2 diabetes mellitus without complications: Secondary | ICD-10-CM | POA: Diagnosis not present

## 2021-10-01 DIAGNOSIS — C711 Malignant neoplasm of frontal lobe: Secondary | ICD-10-CM | POA: Diagnosis not present

## 2021-10-01 DIAGNOSIS — E785 Hyperlipidemia, unspecified: Secondary | ICD-10-CM | POA: Diagnosis not present

## 2021-10-01 DIAGNOSIS — R4701 Aphasia: Secondary | ICD-10-CM | POA: Diagnosis not present

## 2021-10-01 DIAGNOSIS — S42202D Unspecified fracture of upper end of left humerus, subsequent encounter for fracture with routine healing: Secondary | ICD-10-CM | POA: Diagnosis not present

## 2021-10-01 DIAGNOSIS — K219 Gastro-esophageal reflux disease without esophagitis: Secondary | ICD-10-CM | POA: Diagnosis not present

## 2021-10-03 DIAGNOSIS — E785 Hyperlipidemia, unspecified: Secondary | ICD-10-CM | POA: Diagnosis not present

## 2021-10-03 DIAGNOSIS — K219 Gastro-esophageal reflux disease without esophagitis: Secondary | ICD-10-CM | POA: Diagnosis not present

## 2021-10-03 DIAGNOSIS — R4701 Aphasia: Secondary | ICD-10-CM | POA: Diagnosis not present

## 2021-10-03 DIAGNOSIS — C711 Malignant neoplasm of frontal lobe: Secondary | ICD-10-CM | POA: Diagnosis not present

## 2021-10-03 DIAGNOSIS — E119 Type 2 diabetes mellitus without complications: Secondary | ICD-10-CM | POA: Diagnosis not present

## 2021-10-03 DIAGNOSIS — S42202D Unspecified fracture of upper end of left humerus, subsequent encounter for fracture with routine healing: Secondary | ICD-10-CM | POA: Diagnosis not present

## 2021-10-06 DIAGNOSIS — E785 Hyperlipidemia, unspecified: Secondary | ICD-10-CM | POA: Diagnosis not present

## 2021-10-06 DIAGNOSIS — E119 Type 2 diabetes mellitus without complications: Secondary | ICD-10-CM | POA: Diagnosis not present

## 2021-10-06 DIAGNOSIS — R4701 Aphasia: Secondary | ICD-10-CM | POA: Diagnosis not present

## 2021-10-06 DIAGNOSIS — C711 Malignant neoplasm of frontal lobe: Secondary | ICD-10-CM | POA: Diagnosis not present

## 2021-10-06 DIAGNOSIS — K219 Gastro-esophageal reflux disease without esophagitis: Secondary | ICD-10-CM | POA: Diagnosis not present

## 2021-10-06 DIAGNOSIS — S42202D Unspecified fracture of upper end of left humerus, subsequent encounter for fracture with routine healing: Secondary | ICD-10-CM | POA: Diagnosis not present

## 2021-10-08 DIAGNOSIS — S42202D Unspecified fracture of upper end of left humerus, subsequent encounter for fracture with routine healing: Secondary | ICD-10-CM | POA: Diagnosis not present

## 2021-10-08 DIAGNOSIS — R4701 Aphasia: Secondary | ICD-10-CM | POA: Diagnosis not present

## 2021-10-08 DIAGNOSIS — E119 Type 2 diabetes mellitus without complications: Secondary | ICD-10-CM | POA: Diagnosis not present

## 2021-10-08 DIAGNOSIS — K219 Gastro-esophageal reflux disease without esophagitis: Secondary | ICD-10-CM | POA: Diagnosis not present

## 2021-10-08 DIAGNOSIS — C711 Malignant neoplasm of frontal lobe: Secondary | ICD-10-CM | POA: Diagnosis not present

## 2021-10-08 DIAGNOSIS — E785 Hyperlipidemia, unspecified: Secondary | ICD-10-CM | POA: Diagnosis not present

## 2021-10-10 DIAGNOSIS — K219 Gastro-esophageal reflux disease without esophagitis: Secondary | ICD-10-CM | POA: Diagnosis not present

## 2021-10-10 DIAGNOSIS — S42202D Unspecified fracture of upper end of left humerus, subsequent encounter for fracture with routine healing: Secondary | ICD-10-CM | POA: Diagnosis not present

## 2021-10-10 DIAGNOSIS — R4701 Aphasia: Secondary | ICD-10-CM | POA: Diagnosis not present

## 2021-10-10 DIAGNOSIS — E119 Type 2 diabetes mellitus without complications: Secondary | ICD-10-CM | POA: Diagnosis not present

## 2021-10-10 DIAGNOSIS — C711 Malignant neoplasm of frontal lobe: Secondary | ICD-10-CM | POA: Diagnosis not present

## 2021-10-10 DIAGNOSIS — E785 Hyperlipidemia, unspecified: Secondary | ICD-10-CM | POA: Diagnosis not present

## 2021-10-13 DIAGNOSIS — E119 Type 2 diabetes mellitus without complications: Secondary | ICD-10-CM | POA: Diagnosis not present

## 2021-10-13 DIAGNOSIS — E785 Hyperlipidemia, unspecified: Secondary | ICD-10-CM | POA: Diagnosis not present

## 2021-10-13 DIAGNOSIS — C711 Malignant neoplasm of frontal lobe: Secondary | ICD-10-CM | POA: Diagnosis not present

## 2021-10-13 DIAGNOSIS — R4701 Aphasia: Secondary | ICD-10-CM | POA: Diagnosis not present

## 2021-10-13 DIAGNOSIS — S42202D Unspecified fracture of upper end of left humerus, subsequent encounter for fracture with routine healing: Secondary | ICD-10-CM | POA: Diagnosis not present

## 2021-10-13 DIAGNOSIS — K219 Gastro-esophageal reflux disease without esophagitis: Secondary | ICD-10-CM | POA: Diagnosis not present

## 2021-10-15 DIAGNOSIS — S42202D Unspecified fracture of upper end of left humerus, subsequent encounter for fracture with routine healing: Secondary | ICD-10-CM | POA: Diagnosis not present

## 2021-10-15 DIAGNOSIS — E785 Hyperlipidemia, unspecified: Secondary | ICD-10-CM | POA: Diagnosis not present

## 2021-10-15 DIAGNOSIS — R4701 Aphasia: Secondary | ICD-10-CM | POA: Diagnosis not present

## 2021-10-15 DIAGNOSIS — E119 Type 2 diabetes mellitus without complications: Secondary | ICD-10-CM | POA: Diagnosis not present

## 2021-10-15 DIAGNOSIS — K219 Gastro-esophageal reflux disease without esophagitis: Secondary | ICD-10-CM | POA: Diagnosis not present

## 2021-10-15 DIAGNOSIS — C711 Malignant neoplasm of frontal lobe: Secondary | ICD-10-CM | POA: Diagnosis not present

## 2021-10-17 DIAGNOSIS — E785 Hyperlipidemia, unspecified: Secondary | ICD-10-CM | POA: Diagnosis not present

## 2021-10-17 DIAGNOSIS — S42202D Unspecified fracture of upper end of left humerus, subsequent encounter for fracture with routine healing: Secondary | ICD-10-CM | POA: Diagnosis not present

## 2021-10-17 DIAGNOSIS — R4701 Aphasia: Secondary | ICD-10-CM | POA: Diagnosis not present

## 2021-10-17 DIAGNOSIS — K219 Gastro-esophageal reflux disease without esophagitis: Secondary | ICD-10-CM | POA: Diagnosis not present

## 2021-10-17 DIAGNOSIS — E119 Type 2 diabetes mellitus without complications: Secondary | ICD-10-CM | POA: Diagnosis not present

## 2021-10-17 DIAGNOSIS — C711 Malignant neoplasm of frontal lobe: Secondary | ICD-10-CM | POA: Diagnosis not present

## 2021-10-20 DIAGNOSIS — R4701 Aphasia: Secondary | ICD-10-CM | POA: Diagnosis not present

## 2021-10-20 DIAGNOSIS — E785 Hyperlipidemia, unspecified: Secondary | ICD-10-CM | POA: Diagnosis not present

## 2021-10-20 DIAGNOSIS — C711 Malignant neoplasm of frontal lobe: Secondary | ICD-10-CM | POA: Diagnosis not present

## 2021-10-20 DIAGNOSIS — S42202D Unspecified fracture of upper end of left humerus, subsequent encounter for fracture with routine healing: Secondary | ICD-10-CM | POA: Diagnosis not present

## 2021-10-20 DIAGNOSIS — K219 Gastro-esophageal reflux disease without esophagitis: Secondary | ICD-10-CM | POA: Diagnosis not present

## 2021-10-20 DIAGNOSIS — E119 Type 2 diabetes mellitus without complications: Secondary | ICD-10-CM | POA: Diagnosis not present

## 2021-10-21 DIAGNOSIS — E039 Hypothyroidism, unspecified: Secondary | ICD-10-CM | POA: Diagnosis not present

## 2021-10-21 DIAGNOSIS — E119 Type 2 diabetes mellitus without complications: Secondary | ICD-10-CM | POA: Diagnosis not present

## 2021-10-21 DIAGNOSIS — E785 Hyperlipidemia, unspecified: Secondary | ICD-10-CM | POA: Diagnosis not present

## 2021-10-21 DIAGNOSIS — I10 Essential (primary) hypertension: Secondary | ICD-10-CM | POA: Diagnosis not present

## 2021-10-21 DIAGNOSIS — G40909 Epilepsy, unspecified, not intractable, without status epilepticus: Secondary | ICD-10-CM | POA: Diagnosis not present

## 2021-10-21 DIAGNOSIS — K219 Gastro-esophageal reflux disease without esophagitis: Secondary | ICD-10-CM | POA: Diagnosis not present

## 2021-10-21 DIAGNOSIS — G8194 Hemiplegia, unspecified affecting left nondominant side: Secondary | ICD-10-CM | POA: Diagnosis not present

## 2021-10-21 DIAGNOSIS — S01112D Laceration without foreign body of left eyelid and periocular area, subsequent encounter: Secondary | ICD-10-CM | POA: Diagnosis not present

## 2021-10-21 DIAGNOSIS — J302 Other seasonal allergic rhinitis: Secondary | ICD-10-CM | POA: Diagnosis not present

## 2021-10-21 DIAGNOSIS — S42202D Unspecified fracture of upper end of left humerus, subsequent encounter for fracture with routine healing: Secondary | ICD-10-CM | POA: Diagnosis not present

## 2021-10-21 DIAGNOSIS — R159 Full incontinence of feces: Secondary | ICD-10-CM | POA: Diagnosis not present

## 2021-10-21 DIAGNOSIS — C711 Malignant neoplasm of frontal lobe: Secondary | ICD-10-CM | POA: Diagnosis not present

## 2021-10-21 DIAGNOSIS — R4701 Aphasia: Secondary | ICD-10-CM | POA: Diagnosis not present

## 2021-10-21 DIAGNOSIS — R296 Repeated falls: Secondary | ICD-10-CM | POA: Diagnosis not present

## 2021-10-22 DIAGNOSIS — R4701 Aphasia: Secondary | ICD-10-CM | POA: Diagnosis not present

## 2021-10-22 DIAGNOSIS — E785 Hyperlipidemia, unspecified: Secondary | ICD-10-CM | POA: Diagnosis not present

## 2021-10-22 DIAGNOSIS — E119 Type 2 diabetes mellitus without complications: Secondary | ICD-10-CM | POA: Diagnosis not present

## 2021-10-22 DIAGNOSIS — C711 Malignant neoplasm of frontal lobe: Secondary | ICD-10-CM | POA: Diagnosis not present

## 2021-10-22 DIAGNOSIS — K219 Gastro-esophageal reflux disease without esophagitis: Secondary | ICD-10-CM | POA: Diagnosis not present

## 2021-10-22 DIAGNOSIS — S42202D Unspecified fracture of upper end of left humerus, subsequent encounter for fracture with routine healing: Secondary | ICD-10-CM | POA: Diagnosis not present

## 2021-10-24 DIAGNOSIS — E785 Hyperlipidemia, unspecified: Secondary | ICD-10-CM | POA: Diagnosis not present

## 2021-10-24 DIAGNOSIS — C711 Malignant neoplasm of frontal lobe: Secondary | ICD-10-CM | POA: Diagnosis not present

## 2021-10-24 DIAGNOSIS — R4701 Aphasia: Secondary | ICD-10-CM | POA: Diagnosis not present

## 2021-10-24 DIAGNOSIS — K219 Gastro-esophageal reflux disease without esophagitis: Secondary | ICD-10-CM | POA: Diagnosis not present

## 2021-10-24 DIAGNOSIS — E119 Type 2 diabetes mellitus without complications: Secondary | ICD-10-CM | POA: Diagnosis not present

## 2021-10-24 DIAGNOSIS — S42202D Unspecified fracture of upper end of left humerus, subsequent encounter for fracture with routine healing: Secondary | ICD-10-CM | POA: Diagnosis not present

## 2021-10-27 DIAGNOSIS — K219 Gastro-esophageal reflux disease without esophagitis: Secondary | ICD-10-CM | POA: Diagnosis not present

## 2021-10-27 DIAGNOSIS — C711 Malignant neoplasm of frontal lobe: Secondary | ICD-10-CM | POA: Diagnosis not present

## 2021-10-27 DIAGNOSIS — E119 Type 2 diabetes mellitus without complications: Secondary | ICD-10-CM | POA: Diagnosis not present

## 2021-10-27 DIAGNOSIS — R4701 Aphasia: Secondary | ICD-10-CM | POA: Diagnosis not present

## 2021-10-27 DIAGNOSIS — E785 Hyperlipidemia, unspecified: Secondary | ICD-10-CM | POA: Diagnosis not present

## 2021-10-27 DIAGNOSIS — S42202D Unspecified fracture of upper end of left humerus, subsequent encounter for fracture with routine healing: Secondary | ICD-10-CM | POA: Diagnosis not present

## 2021-10-28 DIAGNOSIS — E119 Type 2 diabetes mellitus without complications: Secondary | ICD-10-CM | POA: Diagnosis not present

## 2021-10-28 DIAGNOSIS — C711 Malignant neoplasm of frontal lobe: Secondary | ICD-10-CM | POA: Diagnosis not present

## 2021-10-28 DIAGNOSIS — K219 Gastro-esophageal reflux disease without esophagitis: Secondary | ICD-10-CM | POA: Diagnosis not present

## 2021-10-28 DIAGNOSIS — S42202D Unspecified fracture of upper end of left humerus, subsequent encounter for fracture with routine healing: Secondary | ICD-10-CM | POA: Diagnosis not present

## 2021-10-28 DIAGNOSIS — R4701 Aphasia: Secondary | ICD-10-CM | POA: Diagnosis not present

## 2021-10-28 DIAGNOSIS — E785 Hyperlipidemia, unspecified: Secondary | ICD-10-CM | POA: Diagnosis not present

## 2021-10-29 DIAGNOSIS — E785 Hyperlipidemia, unspecified: Secondary | ICD-10-CM | POA: Diagnosis not present

## 2021-10-29 DIAGNOSIS — E119 Type 2 diabetes mellitus without complications: Secondary | ICD-10-CM | POA: Diagnosis not present

## 2021-10-29 DIAGNOSIS — C711 Malignant neoplasm of frontal lobe: Secondary | ICD-10-CM | POA: Diagnosis not present

## 2021-10-29 DIAGNOSIS — K219 Gastro-esophageal reflux disease without esophagitis: Secondary | ICD-10-CM | POA: Diagnosis not present

## 2021-10-29 DIAGNOSIS — R4701 Aphasia: Secondary | ICD-10-CM | POA: Diagnosis not present

## 2021-10-29 DIAGNOSIS — S42202D Unspecified fracture of upper end of left humerus, subsequent encounter for fracture with routine healing: Secondary | ICD-10-CM | POA: Diagnosis not present

## 2021-10-30 DIAGNOSIS — S42202D Unspecified fracture of upper end of left humerus, subsequent encounter for fracture with routine healing: Secondary | ICD-10-CM | POA: Diagnosis not present

## 2021-10-30 DIAGNOSIS — E119 Type 2 diabetes mellitus without complications: Secondary | ICD-10-CM | POA: Diagnosis not present

## 2021-10-30 DIAGNOSIS — R4701 Aphasia: Secondary | ICD-10-CM | POA: Diagnosis not present

## 2021-10-30 DIAGNOSIS — C711 Malignant neoplasm of frontal lobe: Secondary | ICD-10-CM | POA: Diagnosis not present

## 2021-10-30 DIAGNOSIS — K219 Gastro-esophageal reflux disease without esophagitis: Secondary | ICD-10-CM | POA: Diagnosis not present

## 2021-10-30 DIAGNOSIS — E785 Hyperlipidemia, unspecified: Secondary | ICD-10-CM | POA: Diagnosis not present

## 2021-10-31 DIAGNOSIS — E119 Type 2 diabetes mellitus without complications: Secondary | ICD-10-CM | POA: Diagnosis not present

## 2021-10-31 DIAGNOSIS — R4701 Aphasia: Secondary | ICD-10-CM | POA: Diagnosis not present

## 2021-10-31 DIAGNOSIS — S42202D Unspecified fracture of upper end of left humerus, subsequent encounter for fracture with routine healing: Secondary | ICD-10-CM | POA: Diagnosis not present

## 2021-10-31 DIAGNOSIS — K219 Gastro-esophageal reflux disease without esophagitis: Secondary | ICD-10-CM | POA: Diagnosis not present

## 2021-10-31 DIAGNOSIS — E785 Hyperlipidemia, unspecified: Secondary | ICD-10-CM | POA: Diagnosis not present

## 2021-10-31 DIAGNOSIS — C711 Malignant neoplasm of frontal lobe: Secondary | ICD-10-CM | POA: Diagnosis not present

## 2021-11-01 DIAGNOSIS — E785 Hyperlipidemia, unspecified: Secondary | ICD-10-CM | POA: Diagnosis not present

## 2021-11-01 DIAGNOSIS — C711 Malignant neoplasm of frontal lobe: Secondary | ICD-10-CM | POA: Diagnosis not present

## 2021-11-01 DIAGNOSIS — E119 Type 2 diabetes mellitus without complications: Secondary | ICD-10-CM | POA: Diagnosis not present

## 2021-11-01 DIAGNOSIS — R4701 Aphasia: Secondary | ICD-10-CM | POA: Diagnosis not present

## 2021-11-01 DIAGNOSIS — S42202D Unspecified fracture of upper end of left humerus, subsequent encounter for fracture with routine healing: Secondary | ICD-10-CM | POA: Diagnosis not present

## 2021-11-01 DIAGNOSIS — K219 Gastro-esophageal reflux disease without esophagitis: Secondary | ICD-10-CM | POA: Diagnosis not present

## 2021-11-02 DIAGNOSIS — E119 Type 2 diabetes mellitus without complications: Secondary | ICD-10-CM | POA: Diagnosis not present

## 2021-11-02 DIAGNOSIS — K219 Gastro-esophageal reflux disease without esophagitis: Secondary | ICD-10-CM | POA: Diagnosis not present

## 2021-11-02 DIAGNOSIS — R4701 Aphasia: Secondary | ICD-10-CM | POA: Diagnosis not present

## 2021-11-02 DIAGNOSIS — C711 Malignant neoplasm of frontal lobe: Secondary | ICD-10-CM | POA: Diagnosis not present

## 2021-11-02 DIAGNOSIS — S42202D Unspecified fracture of upper end of left humerus, subsequent encounter for fracture with routine healing: Secondary | ICD-10-CM | POA: Diagnosis not present

## 2021-11-02 DIAGNOSIS — E785 Hyperlipidemia, unspecified: Secondary | ICD-10-CM | POA: Diagnosis not present

## 2021-11-03 DIAGNOSIS — R4701 Aphasia: Secondary | ICD-10-CM | POA: Diagnosis not present

## 2021-11-03 DIAGNOSIS — C711 Malignant neoplasm of frontal lobe: Secondary | ICD-10-CM | POA: Diagnosis not present

## 2021-11-03 DIAGNOSIS — S42202D Unspecified fracture of upper end of left humerus, subsequent encounter for fracture with routine healing: Secondary | ICD-10-CM | POA: Diagnosis not present

## 2021-11-03 DIAGNOSIS — E785 Hyperlipidemia, unspecified: Secondary | ICD-10-CM | POA: Diagnosis not present

## 2021-11-03 DIAGNOSIS — E119 Type 2 diabetes mellitus without complications: Secondary | ICD-10-CM | POA: Diagnosis not present

## 2021-11-03 DIAGNOSIS — K219 Gastro-esophageal reflux disease without esophagitis: Secondary | ICD-10-CM | POA: Diagnosis not present

## 2021-11-05 DIAGNOSIS — C711 Malignant neoplasm of frontal lobe: Secondary | ICD-10-CM | POA: Diagnosis not present

## 2021-11-05 DIAGNOSIS — E785 Hyperlipidemia, unspecified: Secondary | ICD-10-CM | POA: Diagnosis not present

## 2021-11-05 DIAGNOSIS — K219 Gastro-esophageal reflux disease without esophagitis: Secondary | ICD-10-CM | POA: Diagnosis not present

## 2021-11-05 DIAGNOSIS — S42202D Unspecified fracture of upper end of left humerus, subsequent encounter for fracture with routine healing: Secondary | ICD-10-CM | POA: Diagnosis not present

## 2021-11-05 DIAGNOSIS — R4701 Aphasia: Secondary | ICD-10-CM | POA: Diagnosis not present

## 2021-11-05 DIAGNOSIS — E119 Type 2 diabetes mellitus without complications: Secondary | ICD-10-CM | POA: Diagnosis not present

## 2021-11-07 DIAGNOSIS — E119 Type 2 diabetes mellitus without complications: Secondary | ICD-10-CM | POA: Diagnosis not present

## 2021-11-07 DIAGNOSIS — K219 Gastro-esophageal reflux disease without esophagitis: Secondary | ICD-10-CM | POA: Diagnosis not present

## 2021-11-07 DIAGNOSIS — R4701 Aphasia: Secondary | ICD-10-CM | POA: Diagnosis not present

## 2021-11-07 DIAGNOSIS — E785 Hyperlipidemia, unspecified: Secondary | ICD-10-CM | POA: Diagnosis not present

## 2021-11-07 DIAGNOSIS — S42202D Unspecified fracture of upper end of left humerus, subsequent encounter for fracture with routine healing: Secondary | ICD-10-CM | POA: Diagnosis not present

## 2021-11-07 DIAGNOSIS — C711 Malignant neoplasm of frontal lobe: Secondary | ICD-10-CM | POA: Diagnosis not present

## 2021-11-10 DIAGNOSIS — R4701 Aphasia: Secondary | ICD-10-CM | POA: Diagnosis not present

## 2021-11-10 DIAGNOSIS — E785 Hyperlipidemia, unspecified: Secondary | ICD-10-CM | POA: Diagnosis not present

## 2021-11-10 DIAGNOSIS — C711 Malignant neoplasm of frontal lobe: Secondary | ICD-10-CM | POA: Diagnosis not present

## 2021-11-10 DIAGNOSIS — E119 Type 2 diabetes mellitus without complications: Secondary | ICD-10-CM | POA: Diagnosis not present

## 2021-11-10 DIAGNOSIS — S42202D Unspecified fracture of upper end of left humerus, subsequent encounter for fracture with routine healing: Secondary | ICD-10-CM | POA: Diagnosis not present

## 2021-11-10 DIAGNOSIS — K219 Gastro-esophageal reflux disease without esophagitis: Secondary | ICD-10-CM | POA: Diagnosis not present

## 2021-11-11 DIAGNOSIS — E785 Hyperlipidemia, unspecified: Secondary | ICD-10-CM | POA: Diagnosis not present

## 2021-11-11 DIAGNOSIS — C711 Malignant neoplasm of frontal lobe: Secondary | ICD-10-CM | POA: Diagnosis not present

## 2021-11-11 DIAGNOSIS — S42202D Unspecified fracture of upper end of left humerus, subsequent encounter for fracture with routine healing: Secondary | ICD-10-CM | POA: Diagnosis not present

## 2021-11-11 DIAGNOSIS — R4701 Aphasia: Secondary | ICD-10-CM | POA: Diagnosis not present

## 2021-11-11 DIAGNOSIS — E119 Type 2 diabetes mellitus without complications: Secondary | ICD-10-CM | POA: Diagnosis not present

## 2021-11-11 DIAGNOSIS — K219 Gastro-esophageal reflux disease without esophagitis: Secondary | ICD-10-CM | POA: Diagnosis not present

## 2021-11-12 DIAGNOSIS — R4701 Aphasia: Secondary | ICD-10-CM | POA: Diagnosis not present

## 2021-11-12 DIAGNOSIS — S42202D Unspecified fracture of upper end of left humerus, subsequent encounter for fracture with routine healing: Secondary | ICD-10-CM | POA: Diagnosis not present

## 2021-11-12 DIAGNOSIS — E119 Type 2 diabetes mellitus without complications: Secondary | ICD-10-CM | POA: Diagnosis not present

## 2021-11-12 DIAGNOSIS — C711 Malignant neoplasm of frontal lobe: Secondary | ICD-10-CM | POA: Diagnosis not present

## 2021-11-12 DIAGNOSIS — K219 Gastro-esophageal reflux disease without esophagitis: Secondary | ICD-10-CM | POA: Diagnosis not present

## 2021-11-12 DIAGNOSIS — E785 Hyperlipidemia, unspecified: Secondary | ICD-10-CM | POA: Diagnosis not present

## 2021-11-14 DIAGNOSIS — K219 Gastro-esophageal reflux disease without esophagitis: Secondary | ICD-10-CM | POA: Diagnosis not present

## 2021-11-14 DIAGNOSIS — C711 Malignant neoplasm of frontal lobe: Secondary | ICD-10-CM | POA: Diagnosis not present

## 2021-11-14 DIAGNOSIS — S42202D Unspecified fracture of upper end of left humerus, subsequent encounter for fracture with routine healing: Secondary | ICD-10-CM | POA: Diagnosis not present

## 2021-11-14 DIAGNOSIS — R4701 Aphasia: Secondary | ICD-10-CM | POA: Diagnosis not present

## 2021-11-14 DIAGNOSIS — E119 Type 2 diabetes mellitus without complications: Secondary | ICD-10-CM | POA: Diagnosis not present

## 2021-11-14 DIAGNOSIS — E785 Hyperlipidemia, unspecified: Secondary | ICD-10-CM | POA: Diagnosis not present

## 2021-11-17 DIAGNOSIS — E785 Hyperlipidemia, unspecified: Secondary | ICD-10-CM | POA: Diagnosis not present

## 2021-11-17 DIAGNOSIS — C711 Malignant neoplasm of frontal lobe: Secondary | ICD-10-CM | POA: Diagnosis not present

## 2021-11-17 DIAGNOSIS — E119 Type 2 diabetes mellitus without complications: Secondary | ICD-10-CM | POA: Diagnosis not present

## 2021-11-17 DIAGNOSIS — S42202D Unspecified fracture of upper end of left humerus, subsequent encounter for fracture with routine healing: Secondary | ICD-10-CM | POA: Diagnosis not present

## 2021-11-17 DIAGNOSIS — R4701 Aphasia: Secondary | ICD-10-CM | POA: Diagnosis not present

## 2021-11-17 DIAGNOSIS — K219 Gastro-esophageal reflux disease without esophagitis: Secondary | ICD-10-CM | POA: Diagnosis not present

## 2021-11-18 DIAGNOSIS — K219 Gastro-esophageal reflux disease without esophagitis: Secondary | ICD-10-CM | POA: Diagnosis not present

## 2021-11-18 DIAGNOSIS — C711 Malignant neoplasm of frontal lobe: Secondary | ICD-10-CM | POA: Diagnosis not present

## 2021-11-18 DIAGNOSIS — R4701 Aphasia: Secondary | ICD-10-CM | POA: Diagnosis not present

## 2021-11-18 DIAGNOSIS — S42202D Unspecified fracture of upper end of left humerus, subsequent encounter for fracture with routine healing: Secondary | ICD-10-CM | POA: Diagnosis not present

## 2021-11-18 DIAGNOSIS — E119 Type 2 diabetes mellitus without complications: Secondary | ICD-10-CM | POA: Diagnosis not present

## 2021-11-18 DIAGNOSIS — E785 Hyperlipidemia, unspecified: Secondary | ICD-10-CM | POA: Diagnosis not present

## 2021-11-19 DIAGNOSIS — K219 Gastro-esophageal reflux disease without esophagitis: Secondary | ICD-10-CM | POA: Diagnosis not present

## 2021-11-19 DIAGNOSIS — C711 Malignant neoplasm of frontal lobe: Secondary | ICD-10-CM | POA: Diagnosis not present

## 2021-11-19 DIAGNOSIS — R4701 Aphasia: Secondary | ICD-10-CM | POA: Diagnosis not present

## 2021-11-19 DIAGNOSIS — E785 Hyperlipidemia, unspecified: Secondary | ICD-10-CM | POA: Diagnosis not present

## 2021-11-19 DIAGNOSIS — E119 Type 2 diabetes mellitus without complications: Secondary | ICD-10-CM | POA: Diagnosis not present

## 2021-11-19 DIAGNOSIS — S42202D Unspecified fracture of upper end of left humerus, subsequent encounter for fracture with routine healing: Secondary | ICD-10-CM | POA: Diagnosis not present

## 2021-11-21 DIAGNOSIS — E039 Hypothyroidism, unspecified: Secondary | ICD-10-CM | POA: Diagnosis not present

## 2021-11-21 DIAGNOSIS — R4701 Aphasia: Secondary | ICD-10-CM | POA: Diagnosis not present

## 2021-11-21 DIAGNOSIS — E785 Hyperlipidemia, unspecified: Secondary | ICD-10-CM | POA: Diagnosis not present

## 2021-11-21 DIAGNOSIS — S01112D Laceration without foreign body of left eyelid and periocular area, subsequent encounter: Secondary | ICD-10-CM | POA: Diagnosis not present

## 2021-11-21 DIAGNOSIS — E119 Type 2 diabetes mellitus without complications: Secondary | ICD-10-CM | POA: Diagnosis not present

## 2021-11-21 DIAGNOSIS — R159 Full incontinence of feces: Secondary | ICD-10-CM | POA: Diagnosis not present

## 2021-11-21 DIAGNOSIS — S42202D Unspecified fracture of upper end of left humerus, subsequent encounter for fracture with routine healing: Secondary | ICD-10-CM | POA: Diagnosis not present

## 2021-11-21 DIAGNOSIS — R296 Repeated falls: Secondary | ICD-10-CM | POA: Diagnosis not present

## 2021-11-21 DIAGNOSIS — J302 Other seasonal allergic rhinitis: Secondary | ICD-10-CM | POA: Diagnosis not present

## 2021-11-21 DIAGNOSIS — R32 Unspecified urinary incontinence: Secondary | ICD-10-CM | POA: Diagnosis not present

## 2021-11-21 DIAGNOSIS — K219 Gastro-esophageal reflux disease without esophagitis: Secondary | ICD-10-CM | POA: Diagnosis not present

## 2021-11-21 DIAGNOSIS — C711 Malignant neoplasm of frontal lobe: Secondary | ICD-10-CM | POA: Diagnosis not present

## 2021-11-21 DIAGNOSIS — G40909 Epilepsy, unspecified, not intractable, without status epilepticus: Secondary | ICD-10-CM | POA: Diagnosis not present

## 2021-11-21 DIAGNOSIS — G8194 Hemiplegia, unspecified affecting left nondominant side: Secondary | ICD-10-CM | POA: Diagnosis not present

## 2021-11-21 DIAGNOSIS — I10 Essential (primary) hypertension: Secondary | ICD-10-CM | POA: Diagnosis not present

## 2021-11-24 DIAGNOSIS — S42202D Unspecified fracture of upper end of left humerus, subsequent encounter for fracture with routine healing: Secondary | ICD-10-CM | POA: Diagnosis not present

## 2021-11-24 DIAGNOSIS — E119 Type 2 diabetes mellitus without complications: Secondary | ICD-10-CM | POA: Diagnosis not present

## 2021-11-24 DIAGNOSIS — R4701 Aphasia: Secondary | ICD-10-CM | POA: Diagnosis not present

## 2021-11-24 DIAGNOSIS — C711 Malignant neoplasm of frontal lobe: Secondary | ICD-10-CM | POA: Diagnosis not present

## 2021-11-24 DIAGNOSIS — E785 Hyperlipidemia, unspecified: Secondary | ICD-10-CM | POA: Diagnosis not present

## 2021-11-24 DIAGNOSIS — K219 Gastro-esophageal reflux disease without esophagitis: Secondary | ICD-10-CM | POA: Diagnosis not present

## 2021-11-25 DIAGNOSIS — S42202D Unspecified fracture of upper end of left humerus, subsequent encounter for fracture with routine healing: Secondary | ICD-10-CM | POA: Diagnosis not present

## 2021-11-25 DIAGNOSIS — K219 Gastro-esophageal reflux disease without esophagitis: Secondary | ICD-10-CM | POA: Diagnosis not present

## 2021-11-25 DIAGNOSIS — E785 Hyperlipidemia, unspecified: Secondary | ICD-10-CM | POA: Diagnosis not present

## 2021-11-25 DIAGNOSIS — E119 Type 2 diabetes mellitus without complications: Secondary | ICD-10-CM | POA: Diagnosis not present

## 2021-11-25 DIAGNOSIS — R4701 Aphasia: Secondary | ICD-10-CM | POA: Diagnosis not present

## 2021-11-25 DIAGNOSIS — C711 Malignant neoplasm of frontal lobe: Secondary | ICD-10-CM | POA: Diagnosis not present

## 2021-11-26 DIAGNOSIS — E785 Hyperlipidemia, unspecified: Secondary | ICD-10-CM | POA: Diagnosis not present

## 2021-11-26 DIAGNOSIS — E119 Type 2 diabetes mellitus without complications: Secondary | ICD-10-CM | POA: Diagnosis not present

## 2021-11-26 DIAGNOSIS — R4701 Aphasia: Secondary | ICD-10-CM | POA: Diagnosis not present

## 2021-11-26 DIAGNOSIS — K219 Gastro-esophageal reflux disease without esophagitis: Secondary | ICD-10-CM | POA: Diagnosis not present

## 2021-11-26 DIAGNOSIS — C711 Malignant neoplasm of frontal lobe: Secondary | ICD-10-CM | POA: Diagnosis not present

## 2021-11-26 DIAGNOSIS — S42202D Unspecified fracture of upper end of left humerus, subsequent encounter for fracture with routine healing: Secondary | ICD-10-CM | POA: Diagnosis not present

## 2021-11-28 DIAGNOSIS — S42202D Unspecified fracture of upper end of left humerus, subsequent encounter for fracture with routine healing: Secondary | ICD-10-CM | POA: Diagnosis not present

## 2021-11-28 DIAGNOSIS — R4701 Aphasia: Secondary | ICD-10-CM | POA: Diagnosis not present

## 2021-11-28 DIAGNOSIS — E785 Hyperlipidemia, unspecified: Secondary | ICD-10-CM | POA: Diagnosis not present

## 2021-11-28 DIAGNOSIS — E119 Type 2 diabetes mellitus without complications: Secondary | ICD-10-CM | POA: Diagnosis not present

## 2021-11-28 DIAGNOSIS — K219 Gastro-esophageal reflux disease without esophagitis: Secondary | ICD-10-CM | POA: Diagnosis not present

## 2021-11-28 DIAGNOSIS — C711 Malignant neoplasm of frontal lobe: Secondary | ICD-10-CM | POA: Diagnosis not present

## 2021-12-01 DIAGNOSIS — K219 Gastro-esophageal reflux disease without esophagitis: Secondary | ICD-10-CM | POA: Diagnosis not present

## 2021-12-01 DIAGNOSIS — S42202D Unspecified fracture of upper end of left humerus, subsequent encounter for fracture with routine healing: Secondary | ICD-10-CM | POA: Diagnosis not present

## 2021-12-01 DIAGNOSIS — R4701 Aphasia: Secondary | ICD-10-CM | POA: Diagnosis not present

## 2021-12-01 DIAGNOSIS — C711 Malignant neoplasm of frontal lobe: Secondary | ICD-10-CM | POA: Diagnosis not present

## 2021-12-01 DIAGNOSIS — E785 Hyperlipidemia, unspecified: Secondary | ICD-10-CM | POA: Diagnosis not present

## 2021-12-01 DIAGNOSIS — E119 Type 2 diabetes mellitus without complications: Secondary | ICD-10-CM | POA: Diagnosis not present

## 2021-12-02 DIAGNOSIS — S42202D Unspecified fracture of upper end of left humerus, subsequent encounter for fracture with routine healing: Secondary | ICD-10-CM | POA: Diagnosis not present

## 2021-12-02 DIAGNOSIS — E119 Type 2 diabetes mellitus without complications: Secondary | ICD-10-CM | POA: Diagnosis not present

## 2021-12-02 DIAGNOSIS — K219 Gastro-esophageal reflux disease without esophagitis: Secondary | ICD-10-CM | POA: Diagnosis not present

## 2021-12-02 DIAGNOSIS — C711 Malignant neoplasm of frontal lobe: Secondary | ICD-10-CM | POA: Diagnosis not present

## 2021-12-02 DIAGNOSIS — E785 Hyperlipidemia, unspecified: Secondary | ICD-10-CM | POA: Diagnosis not present

## 2021-12-02 DIAGNOSIS — R4701 Aphasia: Secondary | ICD-10-CM | POA: Diagnosis not present

## 2021-12-03 DIAGNOSIS — R4701 Aphasia: Secondary | ICD-10-CM | POA: Diagnosis not present

## 2021-12-03 DIAGNOSIS — C711 Malignant neoplasm of frontal lobe: Secondary | ICD-10-CM | POA: Diagnosis not present

## 2021-12-03 DIAGNOSIS — E119 Type 2 diabetes mellitus without complications: Secondary | ICD-10-CM | POA: Diagnosis not present

## 2021-12-03 DIAGNOSIS — S42202D Unspecified fracture of upper end of left humerus, subsequent encounter for fracture with routine healing: Secondary | ICD-10-CM | POA: Diagnosis not present

## 2021-12-03 DIAGNOSIS — E785 Hyperlipidemia, unspecified: Secondary | ICD-10-CM | POA: Diagnosis not present

## 2021-12-03 DIAGNOSIS — K219 Gastro-esophageal reflux disease without esophagitis: Secondary | ICD-10-CM | POA: Diagnosis not present

## 2021-12-05 DIAGNOSIS — C711 Malignant neoplasm of frontal lobe: Secondary | ICD-10-CM | POA: Diagnosis not present

## 2021-12-05 DIAGNOSIS — R4701 Aphasia: Secondary | ICD-10-CM | POA: Diagnosis not present

## 2021-12-05 DIAGNOSIS — K219 Gastro-esophageal reflux disease without esophagitis: Secondary | ICD-10-CM | POA: Diagnosis not present

## 2021-12-05 DIAGNOSIS — E119 Type 2 diabetes mellitus without complications: Secondary | ICD-10-CM | POA: Diagnosis not present

## 2021-12-05 DIAGNOSIS — S42202D Unspecified fracture of upper end of left humerus, subsequent encounter for fracture with routine healing: Secondary | ICD-10-CM | POA: Diagnosis not present

## 2021-12-05 DIAGNOSIS — E785 Hyperlipidemia, unspecified: Secondary | ICD-10-CM | POA: Diagnosis not present

## 2021-12-08 DIAGNOSIS — C711 Malignant neoplasm of frontal lobe: Secondary | ICD-10-CM | POA: Diagnosis not present

## 2021-12-08 DIAGNOSIS — K219 Gastro-esophageal reflux disease without esophagitis: Secondary | ICD-10-CM | POA: Diagnosis not present

## 2021-12-08 DIAGNOSIS — S42202D Unspecified fracture of upper end of left humerus, subsequent encounter for fracture with routine healing: Secondary | ICD-10-CM | POA: Diagnosis not present

## 2021-12-08 DIAGNOSIS — E119 Type 2 diabetes mellitus without complications: Secondary | ICD-10-CM | POA: Diagnosis not present

## 2021-12-08 DIAGNOSIS — E785 Hyperlipidemia, unspecified: Secondary | ICD-10-CM | POA: Diagnosis not present

## 2021-12-08 DIAGNOSIS — R4701 Aphasia: Secondary | ICD-10-CM | POA: Diagnosis not present

## 2021-12-09 DIAGNOSIS — K219 Gastro-esophageal reflux disease without esophagitis: Secondary | ICD-10-CM | POA: Diagnosis not present

## 2021-12-09 DIAGNOSIS — E785 Hyperlipidemia, unspecified: Secondary | ICD-10-CM | POA: Diagnosis not present

## 2021-12-09 DIAGNOSIS — R4701 Aphasia: Secondary | ICD-10-CM | POA: Diagnosis not present

## 2021-12-09 DIAGNOSIS — S42202D Unspecified fracture of upper end of left humerus, subsequent encounter for fracture with routine healing: Secondary | ICD-10-CM | POA: Diagnosis not present

## 2021-12-09 DIAGNOSIS — C711 Malignant neoplasm of frontal lobe: Secondary | ICD-10-CM | POA: Diagnosis not present

## 2021-12-09 DIAGNOSIS — E119 Type 2 diabetes mellitus without complications: Secondary | ICD-10-CM | POA: Diagnosis not present

## 2021-12-10 DIAGNOSIS — E119 Type 2 diabetes mellitus without complications: Secondary | ICD-10-CM | POA: Diagnosis not present

## 2021-12-10 DIAGNOSIS — R4701 Aphasia: Secondary | ICD-10-CM | POA: Diagnosis not present

## 2021-12-10 DIAGNOSIS — C711 Malignant neoplasm of frontal lobe: Secondary | ICD-10-CM | POA: Diagnosis not present

## 2021-12-10 DIAGNOSIS — E785 Hyperlipidemia, unspecified: Secondary | ICD-10-CM | POA: Diagnosis not present

## 2021-12-10 DIAGNOSIS — K219 Gastro-esophageal reflux disease without esophagitis: Secondary | ICD-10-CM | POA: Diagnosis not present

## 2021-12-10 DIAGNOSIS — S42202D Unspecified fracture of upper end of left humerus, subsequent encounter for fracture with routine healing: Secondary | ICD-10-CM | POA: Diagnosis not present

## 2021-12-12 DIAGNOSIS — E119 Type 2 diabetes mellitus without complications: Secondary | ICD-10-CM | POA: Diagnosis not present

## 2021-12-12 DIAGNOSIS — C711 Malignant neoplasm of frontal lobe: Secondary | ICD-10-CM | POA: Diagnosis not present

## 2021-12-12 DIAGNOSIS — E785 Hyperlipidemia, unspecified: Secondary | ICD-10-CM | POA: Diagnosis not present

## 2021-12-12 DIAGNOSIS — S42202D Unspecified fracture of upper end of left humerus, subsequent encounter for fracture with routine healing: Secondary | ICD-10-CM | POA: Diagnosis not present

## 2021-12-12 DIAGNOSIS — K219 Gastro-esophageal reflux disease without esophagitis: Secondary | ICD-10-CM | POA: Diagnosis not present

## 2021-12-12 DIAGNOSIS — R4701 Aphasia: Secondary | ICD-10-CM | POA: Diagnosis not present

## 2021-12-15 DIAGNOSIS — S42202D Unspecified fracture of upper end of left humerus, subsequent encounter for fracture with routine healing: Secondary | ICD-10-CM | POA: Diagnosis not present

## 2021-12-15 DIAGNOSIS — E785 Hyperlipidemia, unspecified: Secondary | ICD-10-CM | POA: Diagnosis not present

## 2021-12-15 DIAGNOSIS — E119 Type 2 diabetes mellitus without complications: Secondary | ICD-10-CM | POA: Diagnosis not present

## 2021-12-15 DIAGNOSIS — R4701 Aphasia: Secondary | ICD-10-CM | POA: Diagnosis not present

## 2021-12-15 DIAGNOSIS — K219 Gastro-esophageal reflux disease without esophagitis: Secondary | ICD-10-CM | POA: Diagnosis not present

## 2021-12-15 DIAGNOSIS — C711 Malignant neoplasm of frontal lobe: Secondary | ICD-10-CM | POA: Diagnosis not present

## 2021-12-16 DIAGNOSIS — K219 Gastro-esophageal reflux disease without esophagitis: Secondary | ICD-10-CM | POA: Diagnosis not present

## 2021-12-16 DIAGNOSIS — E785 Hyperlipidemia, unspecified: Secondary | ICD-10-CM | POA: Diagnosis not present

## 2021-12-16 DIAGNOSIS — R4701 Aphasia: Secondary | ICD-10-CM | POA: Diagnosis not present

## 2021-12-16 DIAGNOSIS — E119 Type 2 diabetes mellitus without complications: Secondary | ICD-10-CM | POA: Diagnosis not present

## 2021-12-16 DIAGNOSIS — C711 Malignant neoplasm of frontal lobe: Secondary | ICD-10-CM | POA: Diagnosis not present

## 2021-12-16 DIAGNOSIS — S42202D Unspecified fracture of upper end of left humerus, subsequent encounter for fracture with routine healing: Secondary | ICD-10-CM | POA: Diagnosis not present

## 2021-12-17 DIAGNOSIS — C711 Malignant neoplasm of frontal lobe: Secondary | ICD-10-CM | POA: Diagnosis not present

## 2021-12-17 DIAGNOSIS — S42202D Unspecified fracture of upper end of left humerus, subsequent encounter for fracture with routine healing: Secondary | ICD-10-CM | POA: Diagnosis not present

## 2021-12-17 DIAGNOSIS — E785 Hyperlipidemia, unspecified: Secondary | ICD-10-CM | POA: Diagnosis not present

## 2021-12-17 DIAGNOSIS — R4701 Aphasia: Secondary | ICD-10-CM | POA: Diagnosis not present

## 2021-12-17 DIAGNOSIS — E119 Type 2 diabetes mellitus without complications: Secondary | ICD-10-CM | POA: Diagnosis not present

## 2021-12-17 DIAGNOSIS — K219 Gastro-esophageal reflux disease without esophagitis: Secondary | ICD-10-CM | POA: Diagnosis not present

## 2021-12-19 DIAGNOSIS — E785 Hyperlipidemia, unspecified: Secondary | ICD-10-CM | POA: Diagnosis not present

## 2021-12-19 DIAGNOSIS — C711 Malignant neoplasm of frontal lobe: Secondary | ICD-10-CM | POA: Diagnosis not present

## 2021-12-19 DIAGNOSIS — R4701 Aphasia: Secondary | ICD-10-CM | POA: Diagnosis not present

## 2021-12-19 DIAGNOSIS — E119 Type 2 diabetes mellitus without complications: Secondary | ICD-10-CM | POA: Diagnosis not present

## 2021-12-19 DIAGNOSIS — K219 Gastro-esophageal reflux disease without esophagitis: Secondary | ICD-10-CM | POA: Diagnosis not present

## 2021-12-19 DIAGNOSIS — S42202D Unspecified fracture of upper end of left humerus, subsequent encounter for fracture with routine healing: Secondary | ICD-10-CM | POA: Diagnosis not present

## 2021-12-21 DIAGNOSIS — E785 Hyperlipidemia, unspecified: Secondary | ICD-10-CM | POA: Diagnosis not present

## 2021-12-21 DIAGNOSIS — R159 Full incontinence of feces: Secondary | ICD-10-CM | POA: Diagnosis not present

## 2021-12-21 DIAGNOSIS — G8194 Hemiplegia, unspecified affecting left nondominant side: Secondary | ICD-10-CM | POA: Diagnosis not present

## 2021-12-21 DIAGNOSIS — R32 Unspecified urinary incontinence: Secondary | ICD-10-CM | POA: Diagnosis not present

## 2021-12-21 DIAGNOSIS — E039 Hypothyroidism, unspecified: Secondary | ICD-10-CM | POA: Diagnosis not present

## 2021-12-21 DIAGNOSIS — S01112D Laceration without foreign body of left eyelid and periocular area, subsequent encounter: Secondary | ICD-10-CM | POA: Diagnosis not present

## 2021-12-21 DIAGNOSIS — R4701 Aphasia: Secondary | ICD-10-CM | POA: Diagnosis not present

## 2021-12-21 DIAGNOSIS — S42202D Unspecified fracture of upper end of left humerus, subsequent encounter for fracture with routine healing: Secondary | ICD-10-CM | POA: Diagnosis not present

## 2021-12-21 DIAGNOSIS — K219 Gastro-esophageal reflux disease without esophagitis: Secondary | ICD-10-CM | POA: Diagnosis not present

## 2021-12-21 DIAGNOSIS — C711 Malignant neoplasm of frontal lobe: Secondary | ICD-10-CM | POA: Diagnosis not present

## 2021-12-21 DIAGNOSIS — E119 Type 2 diabetes mellitus without complications: Secondary | ICD-10-CM | POA: Diagnosis not present

## 2021-12-21 DIAGNOSIS — G40909 Epilepsy, unspecified, not intractable, without status epilepticus: Secondary | ICD-10-CM | POA: Diagnosis not present

## 2021-12-21 DIAGNOSIS — I10 Essential (primary) hypertension: Secondary | ICD-10-CM | POA: Diagnosis not present

## 2021-12-21 DIAGNOSIS — J302 Other seasonal allergic rhinitis: Secondary | ICD-10-CM | POA: Diagnosis not present

## 2021-12-21 DIAGNOSIS — R296 Repeated falls: Secondary | ICD-10-CM | POA: Diagnosis not present

## 2021-12-22 DIAGNOSIS — E785 Hyperlipidemia, unspecified: Secondary | ICD-10-CM | POA: Diagnosis not present

## 2021-12-22 DIAGNOSIS — E119 Type 2 diabetes mellitus without complications: Secondary | ICD-10-CM | POA: Diagnosis not present

## 2021-12-22 DIAGNOSIS — R4701 Aphasia: Secondary | ICD-10-CM | POA: Diagnosis not present

## 2021-12-22 DIAGNOSIS — S42202D Unspecified fracture of upper end of left humerus, subsequent encounter for fracture with routine healing: Secondary | ICD-10-CM | POA: Diagnosis not present

## 2021-12-22 DIAGNOSIS — K219 Gastro-esophageal reflux disease without esophagitis: Secondary | ICD-10-CM | POA: Diagnosis not present

## 2021-12-22 DIAGNOSIS — C711 Malignant neoplasm of frontal lobe: Secondary | ICD-10-CM | POA: Diagnosis not present

## 2021-12-24 DIAGNOSIS — S42202D Unspecified fracture of upper end of left humerus, subsequent encounter for fracture with routine healing: Secondary | ICD-10-CM | POA: Diagnosis not present

## 2021-12-24 DIAGNOSIS — C711 Malignant neoplasm of frontal lobe: Secondary | ICD-10-CM | POA: Diagnosis not present

## 2021-12-24 DIAGNOSIS — E119 Type 2 diabetes mellitus without complications: Secondary | ICD-10-CM | POA: Diagnosis not present

## 2021-12-24 DIAGNOSIS — R4701 Aphasia: Secondary | ICD-10-CM | POA: Diagnosis not present

## 2021-12-24 DIAGNOSIS — K219 Gastro-esophageal reflux disease without esophagitis: Secondary | ICD-10-CM | POA: Diagnosis not present

## 2021-12-24 DIAGNOSIS — E785 Hyperlipidemia, unspecified: Secondary | ICD-10-CM | POA: Diagnosis not present

## 2021-12-26 DIAGNOSIS — R4701 Aphasia: Secondary | ICD-10-CM | POA: Diagnosis not present

## 2021-12-26 DIAGNOSIS — E119 Type 2 diabetes mellitus without complications: Secondary | ICD-10-CM | POA: Diagnosis not present

## 2021-12-26 DIAGNOSIS — E785 Hyperlipidemia, unspecified: Secondary | ICD-10-CM | POA: Diagnosis not present

## 2021-12-26 DIAGNOSIS — S42202D Unspecified fracture of upper end of left humerus, subsequent encounter for fracture with routine healing: Secondary | ICD-10-CM | POA: Diagnosis not present

## 2021-12-26 DIAGNOSIS — K219 Gastro-esophageal reflux disease without esophagitis: Secondary | ICD-10-CM | POA: Diagnosis not present

## 2021-12-26 DIAGNOSIS — C711 Malignant neoplasm of frontal lobe: Secondary | ICD-10-CM | POA: Diagnosis not present

## 2021-12-29 DIAGNOSIS — C711 Malignant neoplasm of frontal lobe: Secondary | ICD-10-CM | POA: Diagnosis not present

## 2021-12-29 DIAGNOSIS — R4701 Aphasia: Secondary | ICD-10-CM | POA: Diagnosis not present

## 2021-12-29 DIAGNOSIS — S42202D Unspecified fracture of upper end of left humerus, subsequent encounter for fracture with routine healing: Secondary | ICD-10-CM | POA: Diagnosis not present

## 2021-12-29 DIAGNOSIS — E785 Hyperlipidemia, unspecified: Secondary | ICD-10-CM | POA: Diagnosis not present

## 2021-12-29 DIAGNOSIS — E119 Type 2 diabetes mellitus without complications: Secondary | ICD-10-CM | POA: Diagnosis not present

## 2021-12-29 DIAGNOSIS — K219 Gastro-esophageal reflux disease without esophagitis: Secondary | ICD-10-CM | POA: Diagnosis not present

## 2021-12-30 DIAGNOSIS — E119 Type 2 diabetes mellitus without complications: Secondary | ICD-10-CM | POA: Diagnosis not present

## 2021-12-30 DIAGNOSIS — K219 Gastro-esophageal reflux disease without esophagitis: Secondary | ICD-10-CM | POA: Diagnosis not present

## 2021-12-30 DIAGNOSIS — S42202D Unspecified fracture of upper end of left humerus, subsequent encounter for fracture with routine healing: Secondary | ICD-10-CM | POA: Diagnosis not present

## 2021-12-30 DIAGNOSIS — R4701 Aphasia: Secondary | ICD-10-CM | POA: Diagnosis not present

## 2021-12-30 DIAGNOSIS — C711 Malignant neoplasm of frontal lobe: Secondary | ICD-10-CM | POA: Diagnosis not present

## 2021-12-30 DIAGNOSIS — E785 Hyperlipidemia, unspecified: Secondary | ICD-10-CM | POA: Diagnosis not present

## 2021-12-31 DIAGNOSIS — E119 Type 2 diabetes mellitus without complications: Secondary | ICD-10-CM | POA: Diagnosis not present

## 2021-12-31 DIAGNOSIS — S42202D Unspecified fracture of upper end of left humerus, subsequent encounter for fracture with routine healing: Secondary | ICD-10-CM | POA: Diagnosis not present

## 2021-12-31 DIAGNOSIS — C711 Malignant neoplasm of frontal lobe: Secondary | ICD-10-CM | POA: Diagnosis not present

## 2021-12-31 DIAGNOSIS — K219 Gastro-esophageal reflux disease without esophagitis: Secondary | ICD-10-CM | POA: Diagnosis not present

## 2021-12-31 DIAGNOSIS — R4701 Aphasia: Secondary | ICD-10-CM | POA: Diagnosis not present

## 2021-12-31 DIAGNOSIS — E785 Hyperlipidemia, unspecified: Secondary | ICD-10-CM | POA: Diagnosis not present

## 2022-01-01 DIAGNOSIS — E785 Hyperlipidemia, unspecified: Secondary | ICD-10-CM | POA: Diagnosis not present

## 2022-01-01 DIAGNOSIS — E119 Type 2 diabetes mellitus without complications: Secondary | ICD-10-CM | POA: Diagnosis not present

## 2022-01-01 DIAGNOSIS — C711 Malignant neoplasm of frontal lobe: Secondary | ICD-10-CM | POA: Diagnosis not present

## 2022-01-01 DIAGNOSIS — K219 Gastro-esophageal reflux disease without esophagitis: Secondary | ICD-10-CM | POA: Diagnosis not present

## 2022-01-01 DIAGNOSIS — S42202D Unspecified fracture of upper end of left humerus, subsequent encounter for fracture with routine healing: Secondary | ICD-10-CM | POA: Diagnosis not present

## 2022-01-01 DIAGNOSIS — R4701 Aphasia: Secondary | ICD-10-CM | POA: Diagnosis not present

## 2022-01-02 DIAGNOSIS — S42202D Unspecified fracture of upper end of left humerus, subsequent encounter for fracture with routine healing: Secondary | ICD-10-CM | POA: Diagnosis not present

## 2022-01-02 DIAGNOSIS — K219 Gastro-esophageal reflux disease without esophagitis: Secondary | ICD-10-CM | POA: Diagnosis not present

## 2022-01-02 DIAGNOSIS — C711 Malignant neoplasm of frontal lobe: Secondary | ICD-10-CM | POA: Diagnosis not present

## 2022-01-02 DIAGNOSIS — R4701 Aphasia: Secondary | ICD-10-CM | POA: Diagnosis not present

## 2022-01-02 DIAGNOSIS — E785 Hyperlipidemia, unspecified: Secondary | ICD-10-CM | POA: Diagnosis not present

## 2022-01-02 DIAGNOSIS — E119 Type 2 diabetes mellitus without complications: Secondary | ICD-10-CM | POA: Diagnosis not present

## 2022-01-03 DIAGNOSIS — C711 Malignant neoplasm of frontal lobe: Secondary | ICD-10-CM | POA: Diagnosis not present

## 2022-01-03 DIAGNOSIS — K219 Gastro-esophageal reflux disease without esophagitis: Secondary | ICD-10-CM | POA: Diagnosis not present

## 2022-01-03 DIAGNOSIS — S42202D Unspecified fracture of upper end of left humerus, subsequent encounter for fracture with routine healing: Secondary | ICD-10-CM | POA: Diagnosis not present

## 2022-01-03 DIAGNOSIS — E785 Hyperlipidemia, unspecified: Secondary | ICD-10-CM | POA: Diagnosis not present

## 2022-01-03 DIAGNOSIS — R4701 Aphasia: Secondary | ICD-10-CM | POA: Diagnosis not present

## 2022-01-03 DIAGNOSIS — E119 Type 2 diabetes mellitus without complications: Secondary | ICD-10-CM | POA: Diagnosis not present

## 2022-01-04 DIAGNOSIS — S42202D Unspecified fracture of upper end of left humerus, subsequent encounter for fracture with routine healing: Secondary | ICD-10-CM | POA: Diagnosis not present

## 2022-01-04 DIAGNOSIS — K219 Gastro-esophageal reflux disease without esophagitis: Secondary | ICD-10-CM | POA: Diagnosis not present

## 2022-01-04 DIAGNOSIS — R4701 Aphasia: Secondary | ICD-10-CM | POA: Diagnosis not present

## 2022-01-04 DIAGNOSIS — C711 Malignant neoplasm of frontal lobe: Secondary | ICD-10-CM | POA: Diagnosis not present

## 2022-01-04 DIAGNOSIS — E119 Type 2 diabetes mellitus without complications: Secondary | ICD-10-CM | POA: Diagnosis not present

## 2022-01-04 DIAGNOSIS — E785 Hyperlipidemia, unspecified: Secondary | ICD-10-CM | POA: Diagnosis not present

## 2022-01-05 DIAGNOSIS — K219 Gastro-esophageal reflux disease without esophagitis: Secondary | ICD-10-CM | POA: Diagnosis not present

## 2022-01-05 DIAGNOSIS — E119 Type 2 diabetes mellitus without complications: Secondary | ICD-10-CM | POA: Diagnosis not present

## 2022-01-05 DIAGNOSIS — S42202D Unspecified fracture of upper end of left humerus, subsequent encounter for fracture with routine healing: Secondary | ICD-10-CM | POA: Diagnosis not present

## 2022-01-05 DIAGNOSIS — R4701 Aphasia: Secondary | ICD-10-CM | POA: Diagnosis not present

## 2022-01-05 DIAGNOSIS — E785 Hyperlipidemia, unspecified: Secondary | ICD-10-CM | POA: Diagnosis not present

## 2022-01-05 DIAGNOSIS — C711 Malignant neoplasm of frontal lobe: Secondary | ICD-10-CM | POA: Diagnosis not present

## 2022-01-07 DIAGNOSIS — E785 Hyperlipidemia, unspecified: Secondary | ICD-10-CM | POA: Diagnosis not present

## 2022-01-07 DIAGNOSIS — E119 Type 2 diabetes mellitus without complications: Secondary | ICD-10-CM | POA: Diagnosis not present

## 2022-01-07 DIAGNOSIS — R4701 Aphasia: Secondary | ICD-10-CM | POA: Diagnosis not present

## 2022-01-07 DIAGNOSIS — S42202D Unspecified fracture of upper end of left humerus, subsequent encounter for fracture with routine healing: Secondary | ICD-10-CM | POA: Diagnosis not present

## 2022-01-07 DIAGNOSIS — C711 Malignant neoplasm of frontal lobe: Secondary | ICD-10-CM | POA: Diagnosis not present

## 2022-01-07 DIAGNOSIS — K219 Gastro-esophageal reflux disease without esophagitis: Secondary | ICD-10-CM | POA: Diagnosis not present

## 2022-01-09 DIAGNOSIS — S42202D Unspecified fracture of upper end of left humerus, subsequent encounter for fracture with routine healing: Secondary | ICD-10-CM | POA: Diagnosis not present

## 2022-01-09 DIAGNOSIS — E119 Type 2 diabetes mellitus without complications: Secondary | ICD-10-CM | POA: Diagnosis not present

## 2022-01-09 DIAGNOSIS — K219 Gastro-esophageal reflux disease without esophagitis: Secondary | ICD-10-CM | POA: Diagnosis not present

## 2022-01-09 DIAGNOSIS — R4701 Aphasia: Secondary | ICD-10-CM | POA: Diagnosis not present

## 2022-01-09 DIAGNOSIS — C711 Malignant neoplasm of frontal lobe: Secondary | ICD-10-CM | POA: Diagnosis not present

## 2022-01-09 DIAGNOSIS — E785 Hyperlipidemia, unspecified: Secondary | ICD-10-CM | POA: Diagnosis not present

## 2022-01-12 DIAGNOSIS — S42202D Unspecified fracture of upper end of left humerus, subsequent encounter for fracture with routine healing: Secondary | ICD-10-CM | POA: Diagnosis not present

## 2022-01-12 DIAGNOSIS — E119 Type 2 diabetes mellitus without complications: Secondary | ICD-10-CM | POA: Diagnosis not present

## 2022-01-12 DIAGNOSIS — C711 Malignant neoplasm of frontal lobe: Secondary | ICD-10-CM | POA: Diagnosis not present

## 2022-01-12 DIAGNOSIS — R4701 Aphasia: Secondary | ICD-10-CM | POA: Diagnosis not present

## 2022-01-12 DIAGNOSIS — K219 Gastro-esophageal reflux disease without esophagitis: Secondary | ICD-10-CM | POA: Diagnosis not present

## 2022-01-12 DIAGNOSIS — E785 Hyperlipidemia, unspecified: Secondary | ICD-10-CM | POA: Diagnosis not present

## 2022-01-14 DIAGNOSIS — C711 Malignant neoplasm of frontal lobe: Secondary | ICD-10-CM | POA: Diagnosis not present

## 2022-01-14 DIAGNOSIS — K219 Gastro-esophageal reflux disease without esophagitis: Secondary | ICD-10-CM | POA: Diagnosis not present

## 2022-01-14 DIAGNOSIS — R4701 Aphasia: Secondary | ICD-10-CM | POA: Diagnosis not present

## 2022-01-14 DIAGNOSIS — E119 Type 2 diabetes mellitus without complications: Secondary | ICD-10-CM | POA: Diagnosis not present

## 2022-01-14 DIAGNOSIS — S42202D Unspecified fracture of upper end of left humerus, subsequent encounter for fracture with routine healing: Secondary | ICD-10-CM | POA: Diagnosis not present

## 2022-01-14 DIAGNOSIS — E785 Hyperlipidemia, unspecified: Secondary | ICD-10-CM | POA: Diagnosis not present

## 2022-01-16 DIAGNOSIS — S42202D Unspecified fracture of upper end of left humerus, subsequent encounter for fracture with routine healing: Secondary | ICD-10-CM | POA: Diagnosis not present

## 2022-01-16 DIAGNOSIS — E119 Type 2 diabetes mellitus without complications: Secondary | ICD-10-CM | POA: Diagnosis not present

## 2022-01-16 DIAGNOSIS — C711 Malignant neoplasm of frontal lobe: Secondary | ICD-10-CM | POA: Diagnosis not present

## 2022-01-16 DIAGNOSIS — E785 Hyperlipidemia, unspecified: Secondary | ICD-10-CM | POA: Diagnosis not present

## 2022-01-16 DIAGNOSIS — K219 Gastro-esophageal reflux disease without esophagitis: Secondary | ICD-10-CM | POA: Diagnosis not present

## 2022-01-16 DIAGNOSIS — R4701 Aphasia: Secondary | ICD-10-CM | POA: Diagnosis not present

## 2022-01-19 DIAGNOSIS — K219 Gastro-esophageal reflux disease without esophagitis: Secondary | ICD-10-CM | POA: Diagnosis not present

## 2022-01-19 DIAGNOSIS — S42202D Unspecified fracture of upper end of left humerus, subsequent encounter for fracture with routine healing: Secondary | ICD-10-CM | POA: Diagnosis not present

## 2022-01-19 DIAGNOSIS — C711 Malignant neoplasm of frontal lobe: Secondary | ICD-10-CM | POA: Diagnosis not present

## 2022-01-19 DIAGNOSIS — R4701 Aphasia: Secondary | ICD-10-CM | POA: Diagnosis not present

## 2022-01-19 DIAGNOSIS — E119 Type 2 diabetes mellitus without complications: Secondary | ICD-10-CM | POA: Diagnosis not present

## 2022-01-19 DIAGNOSIS — E785 Hyperlipidemia, unspecified: Secondary | ICD-10-CM | POA: Diagnosis not present

## 2022-01-20 DIAGNOSIS — E785 Hyperlipidemia, unspecified: Secondary | ICD-10-CM | POA: Diagnosis not present

## 2022-01-20 DIAGNOSIS — E119 Type 2 diabetes mellitus without complications: Secondary | ICD-10-CM | POA: Diagnosis not present

## 2022-01-20 DIAGNOSIS — K219 Gastro-esophageal reflux disease without esophagitis: Secondary | ICD-10-CM | POA: Diagnosis not present

## 2022-01-20 DIAGNOSIS — C711 Malignant neoplasm of frontal lobe: Secondary | ICD-10-CM | POA: Diagnosis not present

## 2022-01-20 DIAGNOSIS — S42202D Unspecified fracture of upper end of left humerus, subsequent encounter for fracture with routine healing: Secondary | ICD-10-CM | POA: Diagnosis not present

## 2022-01-20 DIAGNOSIS — R4701 Aphasia: Secondary | ICD-10-CM | POA: Diagnosis not present

## 2022-01-21 DIAGNOSIS — K219 Gastro-esophageal reflux disease without esophagitis: Secondary | ICD-10-CM | POA: Diagnosis not present

## 2022-01-21 DIAGNOSIS — R296 Repeated falls: Secondary | ICD-10-CM | POA: Diagnosis not present

## 2022-01-21 DIAGNOSIS — G40909 Epilepsy, unspecified, not intractable, without status epilepticus: Secondary | ICD-10-CM | POA: Diagnosis not present

## 2022-01-21 DIAGNOSIS — S42202D Unspecified fracture of upper end of left humerus, subsequent encounter for fracture with routine healing: Secondary | ICD-10-CM | POA: Diagnosis not present

## 2022-01-21 DIAGNOSIS — Z741 Need for assistance with personal care: Secondary | ICD-10-CM | POA: Diagnosis not present

## 2022-01-21 DIAGNOSIS — R159 Full incontinence of feces: Secondary | ICD-10-CM | POA: Diagnosis not present

## 2022-01-21 DIAGNOSIS — E119 Type 2 diabetes mellitus without complications: Secondary | ICD-10-CM | POA: Diagnosis not present

## 2022-01-21 DIAGNOSIS — J302 Other seasonal allergic rhinitis: Secondary | ICD-10-CM | POA: Diagnosis not present

## 2022-01-21 DIAGNOSIS — I10 Essential (primary) hypertension: Secondary | ICD-10-CM | POA: Diagnosis not present

## 2022-01-21 DIAGNOSIS — Z7401 Bed confinement status: Secondary | ICD-10-CM | POA: Diagnosis not present

## 2022-01-21 DIAGNOSIS — R4701 Aphasia: Secondary | ICD-10-CM | POA: Diagnosis not present

## 2022-01-21 DIAGNOSIS — R32 Unspecified urinary incontinence: Secondary | ICD-10-CM | POA: Diagnosis not present

## 2022-01-21 DIAGNOSIS — G8194 Hemiplegia, unspecified affecting left nondominant side: Secondary | ICD-10-CM | POA: Diagnosis not present

## 2022-01-21 DIAGNOSIS — C711 Malignant neoplasm of frontal lobe: Secondary | ICD-10-CM | POA: Diagnosis not present

## 2022-01-21 DIAGNOSIS — E039 Hypothyroidism, unspecified: Secondary | ICD-10-CM | POA: Diagnosis not present

## 2022-01-21 DIAGNOSIS — S01112D Laceration without foreign body of left eyelid and periocular area, subsequent encounter: Secondary | ICD-10-CM | POA: Diagnosis not present

## 2022-01-21 DIAGNOSIS — E785 Hyperlipidemia, unspecified: Secondary | ICD-10-CM | POA: Diagnosis not present

## 2022-01-23 DIAGNOSIS — E119 Type 2 diabetes mellitus without complications: Secondary | ICD-10-CM | POA: Diagnosis not present

## 2022-01-23 DIAGNOSIS — E785 Hyperlipidemia, unspecified: Secondary | ICD-10-CM | POA: Diagnosis not present

## 2022-01-23 DIAGNOSIS — S42202D Unspecified fracture of upper end of left humerus, subsequent encounter for fracture with routine healing: Secondary | ICD-10-CM | POA: Diagnosis not present

## 2022-01-23 DIAGNOSIS — K219 Gastro-esophageal reflux disease without esophagitis: Secondary | ICD-10-CM | POA: Diagnosis not present

## 2022-01-23 DIAGNOSIS — C711 Malignant neoplasm of frontal lobe: Secondary | ICD-10-CM | POA: Diagnosis not present

## 2022-01-23 DIAGNOSIS — R4701 Aphasia: Secondary | ICD-10-CM | POA: Diagnosis not present

## 2022-01-26 DIAGNOSIS — R4701 Aphasia: Secondary | ICD-10-CM | POA: Diagnosis not present

## 2022-01-26 DIAGNOSIS — E785 Hyperlipidemia, unspecified: Secondary | ICD-10-CM | POA: Diagnosis not present

## 2022-01-26 DIAGNOSIS — K219 Gastro-esophageal reflux disease without esophagitis: Secondary | ICD-10-CM | POA: Diagnosis not present

## 2022-01-26 DIAGNOSIS — E119 Type 2 diabetes mellitus without complications: Secondary | ICD-10-CM | POA: Diagnosis not present

## 2022-01-26 DIAGNOSIS — S42202D Unspecified fracture of upper end of left humerus, subsequent encounter for fracture with routine healing: Secondary | ICD-10-CM | POA: Diagnosis not present

## 2022-01-26 DIAGNOSIS — C711 Malignant neoplasm of frontal lobe: Secondary | ICD-10-CM | POA: Diagnosis not present

## 2022-01-27 DIAGNOSIS — K219 Gastro-esophageal reflux disease without esophagitis: Secondary | ICD-10-CM | POA: Diagnosis not present

## 2022-01-27 DIAGNOSIS — C711 Malignant neoplasm of frontal lobe: Secondary | ICD-10-CM | POA: Diagnosis not present

## 2022-01-27 DIAGNOSIS — S42202D Unspecified fracture of upper end of left humerus, subsequent encounter for fracture with routine healing: Secondary | ICD-10-CM | POA: Diagnosis not present

## 2022-01-27 DIAGNOSIS — E119 Type 2 diabetes mellitus without complications: Secondary | ICD-10-CM | POA: Diagnosis not present

## 2022-01-27 DIAGNOSIS — E785 Hyperlipidemia, unspecified: Secondary | ICD-10-CM | POA: Diagnosis not present

## 2022-01-27 DIAGNOSIS — R4701 Aphasia: Secondary | ICD-10-CM | POA: Diagnosis not present

## 2022-01-28 DIAGNOSIS — C711 Malignant neoplasm of frontal lobe: Secondary | ICD-10-CM | POA: Diagnosis not present

## 2022-01-28 DIAGNOSIS — R4701 Aphasia: Secondary | ICD-10-CM | POA: Diagnosis not present

## 2022-01-28 DIAGNOSIS — E785 Hyperlipidemia, unspecified: Secondary | ICD-10-CM | POA: Diagnosis not present

## 2022-01-28 DIAGNOSIS — S42202D Unspecified fracture of upper end of left humerus, subsequent encounter for fracture with routine healing: Secondary | ICD-10-CM | POA: Diagnosis not present

## 2022-01-28 DIAGNOSIS — E119 Type 2 diabetes mellitus without complications: Secondary | ICD-10-CM | POA: Diagnosis not present

## 2022-01-28 DIAGNOSIS — K219 Gastro-esophageal reflux disease without esophagitis: Secondary | ICD-10-CM | POA: Diagnosis not present

## 2022-01-30 DIAGNOSIS — E785 Hyperlipidemia, unspecified: Secondary | ICD-10-CM | POA: Diagnosis not present

## 2022-01-30 DIAGNOSIS — R4701 Aphasia: Secondary | ICD-10-CM | POA: Diagnosis not present

## 2022-01-30 DIAGNOSIS — C711 Malignant neoplasm of frontal lobe: Secondary | ICD-10-CM | POA: Diagnosis not present

## 2022-01-30 DIAGNOSIS — S42202D Unspecified fracture of upper end of left humerus, subsequent encounter for fracture with routine healing: Secondary | ICD-10-CM | POA: Diagnosis not present

## 2022-01-30 DIAGNOSIS — K219 Gastro-esophageal reflux disease without esophagitis: Secondary | ICD-10-CM | POA: Diagnosis not present

## 2022-01-30 DIAGNOSIS — E119 Type 2 diabetes mellitus without complications: Secondary | ICD-10-CM | POA: Diagnosis not present

## 2022-02-02 DIAGNOSIS — K219 Gastro-esophageal reflux disease without esophagitis: Secondary | ICD-10-CM | POA: Diagnosis not present

## 2022-02-02 DIAGNOSIS — C711 Malignant neoplasm of frontal lobe: Secondary | ICD-10-CM | POA: Diagnosis not present

## 2022-02-02 DIAGNOSIS — S42202D Unspecified fracture of upper end of left humerus, subsequent encounter for fracture with routine healing: Secondary | ICD-10-CM | POA: Diagnosis not present

## 2022-02-02 DIAGNOSIS — E785 Hyperlipidemia, unspecified: Secondary | ICD-10-CM | POA: Diagnosis not present

## 2022-02-02 DIAGNOSIS — E119 Type 2 diabetes mellitus without complications: Secondary | ICD-10-CM | POA: Diagnosis not present

## 2022-02-02 DIAGNOSIS — R4701 Aphasia: Secondary | ICD-10-CM | POA: Diagnosis not present

## 2022-02-04 DIAGNOSIS — K219 Gastro-esophageal reflux disease without esophagitis: Secondary | ICD-10-CM | POA: Diagnosis not present

## 2022-02-04 DIAGNOSIS — E785 Hyperlipidemia, unspecified: Secondary | ICD-10-CM | POA: Diagnosis not present

## 2022-02-04 DIAGNOSIS — C711 Malignant neoplasm of frontal lobe: Secondary | ICD-10-CM | POA: Diagnosis not present

## 2022-02-04 DIAGNOSIS — R4701 Aphasia: Secondary | ICD-10-CM | POA: Diagnosis not present

## 2022-02-04 DIAGNOSIS — S42202D Unspecified fracture of upper end of left humerus, subsequent encounter for fracture with routine healing: Secondary | ICD-10-CM | POA: Diagnosis not present

## 2022-02-04 DIAGNOSIS — E119 Type 2 diabetes mellitus without complications: Secondary | ICD-10-CM | POA: Diagnosis not present

## 2022-02-09 DIAGNOSIS — R4701 Aphasia: Secondary | ICD-10-CM | POA: Diagnosis not present

## 2022-02-09 DIAGNOSIS — E119 Type 2 diabetes mellitus without complications: Secondary | ICD-10-CM | POA: Diagnosis not present

## 2022-02-09 DIAGNOSIS — S42202D Unspecified fracture of upper end of left humerus, subsequent encounter for fracture with routine healing: Secondary | ICD-10-CM | POA: Diagnosis not present

## 2022-02-09 DIAGNOSIS — C711 Malignant neoplasm of frontal lobe: Secondary | ICD-10-CM | POA: Diagnosis not present

## 2022-02-09 DIAGNOSIS — E785 Hyperlipidemia, unspecified: Secondary | ICD-10-CM | POA: Diagnosis not present

## 2022-02-09 DIAGNOSIS — K219 Gastro-esophageal reflux disease without esophagitis: Secondary | ICD-10-CM | POA: Diagnosis not present

## 2022-02-10 DIAGNOSIS — K219 Gastro-esophageal reflux disease without esophagitis: Secondary | ICD-10-CM | POA: Diagnosis not present

## 2022-02-10 DIAGNOSIS — E119 Type 2 diabetes mellitus without complications: Secondary | ICD-10-CM | POA: Diagnosis not present

## 2022-02-10 DIAGNOSIS — S42202D Unspecified fracture of upper end of left humerus, subsequent encounter for fracture with routine healing: Secondary | ICD-10-CM | POA: Diagnosis not present

## 2022-02-10 DIAGNOSIS — C711 Malignant neoplasm of frontal lobe: Secondary | ICD-10-CM | POA: Diagnosis not present

## 2022-02-10 DIAGNOSIS — R4701 Aphasia: Secondary | ICD-10-CM | POA: Diagnosis not present

## 2022-02-10 DIAGNOSIS — E785 Hyperlipidemia, unspecified: Secondary | ICD-10-CM | POA: Diagnosis not present

## 2022-02-11 DIAGNOSIS — S42202D Unspecified fracture of upper end of left humerus, subsequent encounter for fracture with routine healing: Secondary | ICD-10-CM | POA: Diagnosis not present

## 2022-02-11 DIAGNOSIS — K219 Gastro-esophageal reflux disease without esophagitis: Secondary | ICD-10-CM | POA: Diagnosis not present

## 2022-02-11 DIAGNOSIS — R4701 Aphasia: Secondary | ICD-10-CM | POA: Diagnosis not present

## 2022-02-11 DIAGNOSIS — E119 Type 2 diabetes mellitus without complications: Secondary | ICD-10-CM | POA: Diagnosis not present

## 2022-02-11 DIAGNOSIS — C711 Malignant neoplasm of frontal lobe: Secondary | ICD-10-CM | POA: Diagnosis not present

## 2022-02-11 DIAGNOSIS — E785 Hyperlipidemia, unspecified: Secondary | ICD-10-CM | POA: Diagnosis not present

## 2022-02-12 DIAGNOSIS — R4701 Aphasia: Secondary | ICD-10-CM | POA: Diagnosis not present

## 2022-02-12 DIAGNOSIS — K219 Gastro-esophageal reflux disease without esophagitis: Secondary | ICD-10-CM | POA: Diagnosis not present

## 2022-02-12 DIAGNOSIS — E119 Type 2 diabetes mellitus without complications: Secondary | ICD-10-CM | POA: Diagnosis not present

## 2022-02-12 DIAGNOSIS — C711 Malignant neoplasm of frontal lobe: Secondary | ICD-10-CM | POA: Diagnosis not present

## 2022-02-12 DIAGNOSIS — S42202D Unspecified fracture of upper end of left humerus, subsequent encounter for fracture with routine healing: Secondary | ICD-10-CM | POA: Diagnosis not present

## 2022-02-12 DIAGNOSIS — E785 Hyperlipidemia, unspecified: Secondary | ICD-10-CM | POA: Diagnosis not present

## 2022-02-13 DIAGNOSIS — C711 Malignant neoplasm of frontal lobe: Secondary | ICD-10-CM | POA: Diagnosis not present

## 2022-02-13 DIAGNOSIS — R4701 Aphasia: Secondary | ICD-10-CM | POA: Diagnosis not present

## 2022-02-13 DIAGNOSIS — E119 Type 2 diabetes mellitus without complications: Secondary | ICD-10-CM | POA: Diagnosis not present

## 2022-02-13 DIAGNOSIS — E785 Hyperlipidemia, unspecified: Secondary | ICD-10-CM | POA: Diagnosis not present

## 2022-02-13 DIAGNOSIS — K219 Gastro-esophageal reflux disease without esophagitis: Secondary | ICD-10-CM | POA: Diagnosis not present

## 2022-02-13 DIAGNOSIS — S42202D Unspecified fracture of upper end of left humerus, subsequent encounter for fracture with routine healing: Secondary | ICD-10-CM | POA: Diagnosis not present

## 2022-02-14 DIAGNOSIS — E119 Type 2 diabetes mellitus without complications: Secondary | ICD-10-CM | POA: Diagnosis not present

## 2022-02-14 DIAGNOSIS — R4701 Aphasia: Secondary | ICD-10-CM | POA: Diagnosis not present

## 2022-02-14 DIAGNOSIS — E785 Hyperlipidemia, unspecified: Secondary | ICD-10-CM | POA: Diagnosis not present

## 2022-02-14 DIAGNOSIS — S42202D Unspecified fracture of upper end of left humerus, subsequent encounter for fracture with routine healing: Secondary | ICD-10-CM | POA: Diagnosis not present

## 2022-02-14 DIAGNOSIS — C711 Malignant neoplasm of frontal lobe: Secondary | ICD-10-CM | POA: Diagnosis not present

## 2022-02-14 DIAGNOSIS — K219 Gastro-esophageal reflux disease without esophagitis: Secondary | ICD-10-CM | POA: Diagnosis not present

## 2022-02-15 DIAGNOSIS — K219 Gastro-esophageal reflux disease without esophagitis: Secondary | ICD-10-CM | POA: Diagnosis not present

## 2022-02-15 DIAGNOSIS — E119 Type 2 diabetes mellitus without complications: Secondary | ICD-10-CM | POA: Diagnosis not present

## 2022-02-15 DIAGNOSIS — C711 Malignant neoplasm of frontal lobe: Secondary | ICD-10-CM | POA: Diagnosis not present

## 2022-02-15 DIAGNOSIS — R4701 Aphasia: Secondary | ICD-10-CM | POA: Diagnosis not present

## 2022-02-15 DIAGNOSIS — S42202D Unspecified fracture of upper end of left humerus, subsequent encounter for fracture with routine healing: Secondary | ICD-10-CM | POA: Diagnosis not present

## 2022-02-15 DIAGNOSIS — E785 Hyperlipidemia, unspecified: Secondary | ICD-10-CM | POA: Diagnosis not present

## 2022-02-16 DIAGNOSIS — C711 Malignant neoplasm of frontal lobe: Secondary | ICD-10-CM | POA: Diagnosis not present

## 2022-02-16 DIAGNOSIS — E119 Type 2 diabetes mellitus without complications: Secondary | ICD-10-CM | POA: Diagnosis not present

## 2022-02-16 DIAGNOSIS — E785 Hyperlipidemia, unspecified: Secondary | ICD-10-CM | POA: Diagnosis not present

## 2022-02-16 DIAGNOSIS — S42202D Unspecified fracture of upper end of left humerus, subsequent encounter for fracture with routine healing: Secondary | ICD-10-CM | POA: Diagnosis not present

## 2022-02-16 DIAGNOSIS — R4701 Aphasia: Secondary | ICD-10-CM | POA: Diagnosis not present

## 2022-02-16 DIAGNOSIS — K219 Gastro-esophageal reflux disease without esophagitis: Secondary | ICD-10-CM | POA: Diagnosis not present

## 2022-02-18 DIAGNOSIS — R4701 Aphasia: Secondary | ICD-10-CM | POA: Diagnosis not present

## 2022-02-18 DIAGNOSIS — E119 Type 2 diabetes mellitus without complications: Secondary | ICD-10-CM | POA: Diagnosis not present

## 2022-02-18 DIAGNOSIS — S42202D Unspecified fracture of upper end of left humerus, subsequent encounter for fracture with routine healing: Secondary | ICD-10-CM | POA: Diagnosis not present

## 2022-02-18 DIAGNOSIS — K219 Gastro-esophageal reflux disease without esophagitis: Secondary | ICD-10-CM | POA: Diagnosis not present

## 2022-02-18 DIAGNOSIS — E785 Hyperlipidemia, unspecified: Secondary | ICD-10-CM | POA: Diagnosis not present

## 2022-02-18 DIAGNOSIS — C711 Malignant neoplasm of frontal lobe: Secondary | ICD-10-CM | POA: Diagnosis not present

## 2022-02-20 DIAGNOSIS — E119 Type 2 diabetes mellitus without complications: Secondary | ICD-10-CM | POA: Diagnosis not present

## 2022-02-20 DIAGNOSIS — G40909 Epilepsy, unspecified, not intractable, without status epilepticus: Secondary | ICD-10-CM | POA: Diagnosis not present

## 2022-02-20 DIAGNOSIS — S42202D Unspecified fracture of upper end of left humerus, subsequent encounter for fracture with routine healing: Secondary | ICD-10-CM | POA: Diagnosis not present

## 2022-02-20 DIAGNOSIS — Z741 Need for assistance with personal care: Secondary | ICD-10-CM | POA: Diagnosis not present

## 2022-02-20 DIAGNOSIS — R32 Unspecified urinary incontinence: Secondary | ICD-10-CM | POA: Diagnosis not present

## 2022-02-20 DIAGNOSIS — R159 Full incontinence of feces: Secondary | ICD-10-CM | POA: Diagnosis not present

## 2022-02-20 DIAGNOSIS — K219 Gastro-esophageal reflux disease without esophagitis: Secondary | ICD-10-CM | POA: Diagnosis not present

## 2022-02-20 DIAGNOSIS — G8194 Hemiplegia, unspecified affecting left nondominant side: Secondary | ICD-10-CM | POA: Diagnosis not present

## 2022-02-20 DIAGNOSIS — S01112D Laceration without foreign body of left eyelid and periocular area, subsequent encounter: Secondary | ICD-10-CM | POA: Diagnosis not present

## 2022-02-20 DIAGNOSIS — E039 Hypothyroidism, unspecified: Secondary | ICD-10-CM | POA: Diagnosis not present

## 2022-02-20 DIAGNOSIS — Z7401 Bed confinement status: Secondary | ICD-10-CM | POA: Diagnosis not present

## 2022-02-20 DIAGNOSIS — I10 Essential (primary) hypertension: Secondary | ICD-10-CM | POA: Diagnosis not present

## 2022-02-20 DIAGNOSIS — J302 Other seasonal allergic rhinitis: Secondary | ICD-10-CM | POA: Diagnosis not present

## 2022-02-20 DIAGNOSIS — R4701 Aphasia: Secondary | ICD-10-CM | POA: Diagnosis not present

## 2022-02-20 DIAGNOSIS — C711 Malignant neoplasm of frontal lobe: Secondary | ICD-10-CM | POA: Diagnosis not present

## 2022-02-20 DIAGNOSIS — E785 Hyperlipidemia, unspecified: Secondary | ICD-10-CM | POA: Diagnosis not present

## 2022-02-20 DIAGNOSIS — R296 Repeated falls: Secondary | ICD-10-CM | POA: Diagnosis not present

## 2022-02-23 DIAGNOSIS — R4701 Aphasia: Secondary | ICD-10-CM | POA: Diagnosis not present

## 2022-02-23 DIAGNOSIS — K219 Gastro-esophageal reflux disease without esophagitis: Secondary | ICD-10-CM | POA: Diagnosis not present

## 2022-02-23 DIAGNOSIS — E119 Type 2 diabetes mellitus without complications: Secondary | ICD-10-CM | POA: Diagnosis not present

## 2022-02-23 DIAGNOSIS — C711 Malignant neoplasm of frontal lobe: Secondary | ICD-10-CM | POA: Diagnosis not present

## 2022-02-23 DIAGNOSIS — S42202D Unspecified fracture of upper end of left humerus, subsequent encounter for fracture with routine healing: Secondary | ICD-10-CM | POA: Diagnosis not present

## 2022-02-23 DIAGNOSIS — E785 Hyperlipidemia, unspecified: Secondary | ICD-10-CM | POA: Diagnosis not present

## 2022-02-24 DIAGNOSIS — K219 Gastro-esophageal reflux disease without esophagitis: Secondary | ICD-10-CM | POA: Diagnosis not present

## 2022-02-24 DIAGNOSIS — E119 Type 2 diabetes mellitus without complications: Secondary | ICD-10-CM | POA: Diagnosis not present

## 2022-02-24 DIAGNOSIS — C711 Malignant neoplasm of frontal lobe: Secondary | ICD-10-CM | POA: Diagnosis not present

## 2022-02-24 DIAGNOSIS — S42202D Unspecified fracture of upper end of left humerus, subsequent encounter for fracture with routine healing: Secondary | ICD-10-CM | POA: Diagnosis not present

## 2022-02-24 DIAGNOSIS — E785 Hyperlipidemia, unspecified: Secondary | ICD-10-CM | POA: Diagnosis not present

## 2022-02-24 DIAGNOSIS — R4701 Aphasia: Secondary | ICD-10-CM | POA: Diagnosis not present

## 2022-02-25 DIAGNOSIS — E785 Hyperlipidemia, unspecified: Secondary | ICD-10-CM | POA: Diagnosis not present

## 2022-02-25 DIAGNOSIS — K219 Gastro-esophageal reflux disease without esophagitis: Secondary | ICD-10-CM | POA: Diagnosis not present

## 2022-02-25 DIAGNOSIS — S42202D Unspecified fracture of upper end of left humerus, subsequent encounter for fracture with routine healing: Secondary | ICD-10-CM | POA: Diagnosis not present

## 2022-02-25 DIAGNOSIS — R4701 Aphasia: Secondary | ICD-10-CM | POA: Diagnosis not present

## 2022-02-25 DIAGNOSIS — C711 Malignant neoplasm of frontal lobe: Secondary | ICD-10-CM | POA: Diagnosis not present

## 2022-02-25 DIAGNOSIS — E119 Type 2 diabetes mellitus without complications: Secondary | ICD-10-CM | POA: Diagnosis not present

## 2022-02-27 DIAGNOSIS — R4701 Aphasia: Secondary | ICD-10-CM | POA: Diagnosis not present

## 2022-02-27 DIAGNOSIS — E119 Type 2 diabetes mellitus without complications: Secondary | ICD-10-CM | POA: Diagnosis not present

## 2022-02-27 DIAGNOSIS — E785 Hyperlipidemia, unspecified: Secondary | ICD-10-CM | POA: Diagnosis not present

## 2022-02-27 DIAGNOSIS — S42202D Unspecified fracture of upper end of left humerus, subsequent encounter for fracture with routine healing: Secondary | ICD-10-CM | POA: Diagnosis not present

## 2022-02-27 DIAGNOSIS — K219 Gastro-esophageal reflux disease without esophagitis: Secondary | ICD-10-CM | POA: Diagnosis not present

## 2022-02-27 DIAGNOSIS — C711 Malignant neoplasm of frontal lobe: Secondary | ICD-10-CM | POA: Diagnosis not present

## 2022-03-02 DIAGNOSIS — E785 Hyperlipidemia, unspecified: Secondary | ICD-10-CM | POA: Diagnosis not present

## 2022-03-02 DIAGNOSIS — K219 Gastro-esophageal reflux disease without esophagitis: Secondary | ICD-10-CM | POA: Diagnosis not present

## 2022-03-02 DIAGNOSIS — E119 Type 2 diabetes mellitus without complications: Secondary | ICD-10-CM | POA: Diagnosis not present

## 2022-03-02 DIAGNOSIS — R4701 Aphasia: Secondary | ICD-10-CM | POA: Diagnosis not present

## 2022-03-02 DIAGNOSIS — S42202D Unspecified fracture of upper end of left humerus, subsequent encounter for fracture with routine healing: Secondary | ICD-10-CM | POA: Diagnosis not present

## 2022-03-02 DIAGNOSIS — C711 Malignant neoplasm of frontal lobe: Secondary | ICD-10-CM | POA: Diagnosis not present

## 2022-03-04 DIAGNOSIS — C711 Malignant neoplasm of frontal lobe: Secondary | ICD-10-CM | POA: Diagnosis not present

## 2022-03-04 DIAGNOSIS — E119 Type 2 diabetes mellitus without complications: Secondary | ICD-10-CM | POA: Diagnosis not present

## 2022-03-04 DIAGNOSIS — K219 Gastro-esophageal reflux disease without esophagitis: Secondary | ICD-10-CM | POA: Diagnosis not present

## 2022-03-04 DIAGNOSIS — S42202D Unspecified fracture of upper end of left humerus, subsequent encounter for fracture with routine healing: Secondary | ICD-10-CM | POA: Diagnosis not present

## 2022-03-04 DIAGNOSIS — E785 Hyperlipidemia, unspecified: Secondary | ICD-10-CM | POA: Diagnosis not present

## 2022-03-04 DIAGNOSIS — R4701 Aphasia: Secondary | ICD-10-CM | POA: Diagnosis not present

## 2022-03-09 DIAGNOSIS — E785 Hyperlipidemia, unspecified: Secondary | ICD-10-CM | POA: Diagnosis not present

## 2022-03-09 DIAGNOSIS — R4701 Aphasia: Secondary | ICD-10-CM | POA: Diagnosis not present

## 2022-03-09 DIAGNOSIS — E119 Type 2 diabetes mellitus without complications: Secondary | ICD-10-CM | POA: Diagnosis not present

## 2022-03-09 DIAGNOSIS — S42202D Unspecified fracture of upper end of left humerus, subsequent encounter for fracture with routine healing: Secondary | ICD-10-CM | POA: Diagnosis not present

## 2022-03-09 DIAGNOSIS — K219 Gastro-esophageal reflux disease without esophagitis: Secondary | ICD-10-CM | POA: Diagnosis not present

## 2022-03-09 DIAGNOSIS — C711 Malignant neoplasm of frontal lobe: Secondary | ICD-10-CM | POA: Diagnosis not present

## 2022-03-11 DIAGNOSIS — S42202D Unspecified fracture of upper end of left humerus, subsequent encounter for fracture with routine healing: Secondary | ICD-10-CM | POA: Diagnosis not present

## 2022-03-11 DIAGNOSIS — E785 Hyperlipidemia, unspecified: Secondary | ICD-10-CM | POA: Diagnosis not present

## 2022-03-11 DIAGNOSIS — C711 Malignant neoplasm of frontal lobe: Secondary | ICD-10-CM | POA: Diagnosis not present

## 2022-03-11 DIAGNOSIS — E119 Type 2 diabetes mellitus without complications: Secondary | ICD-10-CM | POA: Diagnosis not present

## 2022-03-11 DIAGNOSIS — K219 Gastro-esophageal reflux disease without esophagitis: Secondary | ICD-10-CM | POA: Diagnosis not present

## 2022-03-11 DIAGNOSIS — R4701 Aphasia: Secondary | ICD-10-CM | POA: Diagnosis not present

## 2022-03-12 DIAGNOSIS — S42202D Unspecified fracture of upper end of left humerus, subsequent encounter for fracture with routine healing: Secondary | ICD-10-CM | POA: Diagnosis not present

## 2022-03-12 DIAGNOSIS — E785 Hyperlipidemia, unspecified: Secondary | ICD-10-CM | POA: Diagnosis not present

## 2022-03-12 DIAGNOSIS — C711 Malignant neoplasm of frontal lobe: Secondary | ICD-10-CM | POA: Diagnosis not present

## 2022-03-12 DIAGNOSIS — R4701 Aphasia: Secondary | ICD-10-CM | POA: Diagnosis not present

## 2022-03-12 DIAGNOSIS — K219 Gastro-esophageal reflux disease without esophagitis: Secondary | ICD-10-CM | POA: Diagnosis not present

## 2022-03-12 DIAGNOSIS — E119 Type 2 diabetes mellitus without complications: Secondary | ICD-10-CM | POA: Diagnosis not present

## 2022-03-18 DIAGNOSIS — E119 Type 2 diabetes mellitus without complications: Secondary | ICD-10-CM | POA: Diagnosis not present

## 2022-03-18 DIAGNOSIS — S42202D Unspecified fracture of upper end of left humerus, subsequent encounter for fracture with routine healing: Secondary | ICD-10-CM | POA: Diagnosis not present

## 2022-03-18 DIAGNOSIS — R4701 Aphasia: Secondary | ICD-10-CM | POA: Diagnosis not present

## 2022-03-18 DIAGNOSIS — E785 Hyperlipidemia, unspecified: Secondary | ICD-10-CM | POA: Diagnosis not present

## 2022-03-18 DIAGNOSIS — C711 Malignant neoplasm of frontal lobe: Secondary | ICD-10-CM | POA: Diagnosis not present

## 2022-03-18 DIAGNOSIS — K219 Gastro-esophageal reflux disease without esophagitis: Secondary | ICD-10-CM | POA: Diagnosis not present

## 2022-03-20 DIAGNOSIS — C711 Malignant neoplasm of frontal lobe: Secondary | ICD-10-CM | POA: Diagnosis not present

## 2022-03-20 DIAGNOSIS — E119 Type 2 diabetes mellitus without complications: Secondary | ICD-10-CM | POA: Diagnosis not present

## 2022-03-20 DIAGNOSIS — R4701 Aphasia: Secondary | ICD-10-CM | POA: Diagnosis not present

## 2022-03-20 DIAGNOSIS — S42202D Unspecified fracture of upper end of left humerus, subsequent encounter for fracture with routine healing: Secondary | ICD-10-CM | POA: Diagnosis not present

## 2022-03-20 DIAGNOSIS — E785 Hyperlipidemia, unspecified: Secondary | ICD-10-CM | POA: Diagnosis not present

## 2022-03-20 DIAGNOSIS — K219 Gastro-esophageal reflux disease without esophagitis: Secondary | ICD-10-CM | POA: Diagnosis not present

## 2022-03-21 DIAGNOSIS — C711 Malignant neoplasm of frontal lobe: Secondary | ICD-10-CM | POA: Diagnosis not present

## 2022-03-21 DIAGNOSIS — R4701 Aphasia: Secondary | ICD-10-CM | POA: Diagnosis not present

## 2022-03-21 DIAGNOSIS — E119 Type 2 diabetes mellitus without complications: Secondary | ICD-10-CM | POA: Diagnosis not present

## 2022-03-21 DIAGNOSIS — S42202D Unspecified fracture of upper end of left humerus, subsequent encounter for fracture with routine healing: Secondary | ICD-10-CM | POA: Diagnosis not present

## 2022-03-21 DIAGNOSIS — E785 Hyperlipidemia, unspecified: Secondary | ICD-10-CM | POA: Diagnosis not present

## 2022-03-21 DIAGNOSIS — K219 Gastro-esophageal reflux disease without esophagitis: Secondary | ICD-10-CM | POA: Diagnosis not present

## 2022-03-22 DIAGNOSIS — E785 Hyperlipidemia, unspecified: Secondary | ICD-10-CM | POA: Diagnosis not present

## 2022-03-22 DIAGNOSIS — R4701 Aphasia: Secondary | ICD-10-CM | POA: Diagnosis not present

## 2022-03-22 DIAGNOSIS — K219 Gastro-esophageal reflux disease without esophagitis: Secondary | ICD-10-CM | POA: Diagnosis not present

## 2022-03-22 DIAGNOSIS — E119 Type 2 diabetes mellitus without complications: Secondary | ICD-10-CM | POA: Diagnosis not present

## 2022-03-22 DIAGNOSIS — C711 Malignant neoplasm of frontal lobe: Secondary | ICD-10-CM | POA: Diagnosis not present

## 2022-03-22 DIAGNOSIS — S42202D Unspecified fracture of upper end of left humerus, subsequent encounter for fracture with routine healing: Secondary | ICD-10-CM | POA: Diagnosis not present

## 2022-03-23 DIAGNOSIS — G8194 Hemiplegia, unspecified affecting left nondominant side: Secondary | ICD-10-CM | POA: Diagnosis not present

## 2022-03-23 DIAGNOSIS — R296 Repeated falls: Secondary | ICD-10-CM | POA: Diagnosis not present

## 2022-03-23 DIAGNOSIS — E785 Hyperlipidemia, unspecified: Secondary | ICD-10-CM | POA: Diagnosis not present

## 2022-03-23 DIAGNOSIS — E039 Hypothyroidism, unspecified: Secondary | ICD-10-CM | POA: Diagnosis not present

## 2022-03-23 DIAGNOSIS — I10 Essential (primary) hypertension: Secondary | ICD-10-CM | POA: Diagnosis not present

## 2022-03-23 DIAGNOSIS — R4701 Aphasia: Secondary | ICD-10-CM | POA: Diagnosis not present

## 2022-03-23 DIAGNOSIS — K219 Gastro-esophageal reflux disease without esophagitis: Secondary | ICD-10-CM | POA: Diagnosis not present

## 2022-03-23 DIAGNOSIS — E119 Type 2 diabetes mellitus without complications: Secondary | ICD-10-CM | POA: Diagnosis not present

## 2022-03-23 DIAGNOSIS — S01112D Laceration without foreign body of left eyelid and periocular area, subsequent encounter: Secondary | ICD-10-CM | POA: Diagnosis not present

## 2022-03-23 DIAGNOSIS — J302 Other seasonal allergic rhinitis: Secondary | ICD-10-CM | POA: Diagnosis not present

## 2022-03-23 DIAGNOSIS — R159 Full incontinence of feces: Secondary | ICD-10-CM | POA: Diagnosis not present

## 2022-03-23 DIAGNOSIS — Z7401 Bed confinement status: Secondary | ICD-10-CM | POA: Diagnosis not present

## 2022-03-23 DIAGNOSIS — G40909 Epilepsy, unspecified, not intractable, without status epilepticus: Secondary | ICD-10-CM | POA: Diagnosis not present

## 2022-03-23 DIAGNOSIS — S42202D Unspecified fracture of upper end of left humerus, subsequent encounter for fracture with routine healing: Secondary | ICD-10-CM | POA: Diagnosis not present

## 2022-03-23 DIAGNOSIS — C711 Malignant neoplasm of frontal lobe: Secondary | ICD-10-CM | POA: Diagnosis not present

## 2022-03-23 DIAGNOSIS — R32 Unspecified urinary incontinence: Secondary | ICD-10-CM | POA: Diagnosis not present

## 2022-03-23 DIAGNOSIS — Z741 Need for assistance with personal care: Secondary | ICD-10-CM | POA: Diagnosis not present

## 2022-03-24 DIAGNOSIS — S42202D Unspecified fracture of upper end of left humerus, subsequent encounter for fracture with routine healing: Secondary | ICD-10-CM | POA: Diagnosis not present

## 2022-03-24 DIAGNOSIS — E119 Type 2 diabetes mellitus without complications: Secondary | ICD-10-CM | POA: Diagnosis not present

## 2022-03-24 DIAGNOSIS — E785 Hyperlipidemia, unspecified: Secondary | ICD-10-CM | POA: Diagnosis not present

## 2022-03-24 DIAGNOSIS — K219 Gastro-esophageal reflux disease without esophagitis: Secondary | ICD-10-CM | POA: Diagnosis not present

## 2022-03-24 DIAGNOSIS — R4701 Aphasia: Secondary | ICD-10-CM | POA: Diagnosis not present

## 2022-03-24 DIAGNOSIS — C711 Malignant neoplasm of frontal lobe: Secondary | ICD-10-CM | POA: Diagnosis not present

## 2022-03-25 DIAGNOSIS — S42202D Unspecified fracture of upper end of left humerus, subsequent encounter for fracture with routine healing: Secondary | ICD-10-CM | POA: Diagnosis not present

## 2022-03-25 DIAGNOSIS — E119 Type 2 diabetes mellitus without complications: Secondary | ICD-10-CM | POA: Diagnosis not present

## 2022-03-25 DIAGNOSIS — R4701 Aphasia: Secondary | ICD-10-CM | POA: Diagnosis not present

## 2022-03-25 DIAGNOSIS — E785 Hyperlipidemia, unspecified: Secondary | ICD-10-CM | POA: Diagnosis not present

## 2022-03-25 DIAGNOSIS — R404 Transient alteration of awareness: Secondary | ICD-10-CM | POA: Diagnosis not present

## 2022-03-25 DIAGNOSIS — Z7401 Bed confinement status: Secondary | ICD-10-CM | POA: Diagnosis not present

## 2022-03-25 DIAGNOSIS — K219 Gastro-esophageal reflux disease without esophagitis: Secondary | ICD-10-CM | POA: Diagnosis not present

## 2022-03-25 DIAGNOSIS — C711 Malignant neoplasm of frontal lobe: Secondary | ICD-10-CM | POA: Diagnosis not present

## 2022-03-26 DIAGNOSIS — E785 Hyperlipidemia, unspecified: Secondary | ICD-10-CM | POA: Diagnosis not present

## 2022-03-26 DIAGNOSIS — S42202D Unspecified fracture of upper end of left humerus, subsequent encounter for fracture with routine healing: Secondary | ICD-10-CM | POA: Diagnosis not present

## 2022-03-26 DIAGNOSIS — R4701 Aphasia: Secondary | ICD-10-CM | POA: Diagnosis not present

## 2022-03-26 DIAGNOSIS — C711 Malignant neoplasm of frontal lobe: Secondary | ICD-10-CM | POA: Diagnosis not present

## 2022-03-26 DIAGNOSIS — K219 Gastro-esophageal reflux disease without esophagitis: Secondary | ICD-10-CM | POA: Diagnosis not present

## 2022-03-26 DIAGNOSIS — E119 Type 2 diabetes mellitus without complications: Secondary | ICD-10-CM | POA: Diagnosis not present

## 2022-03-30 DIAGNOSIS — K219 Gastro-esophageal reflux disease without esophagitis: Secondary | ICD-10-CM | POA: Diagnosis not present

## 2022-03-30 DIAGNOSIS — C711 Malignant neoplasm of frontal lobe: Secondary | ICD-10-CM | POA: Diagnosis not present

## 2022-03-30 DIAGNOSIS — S42202D Unspecified fracture of upper end of left humerus, subsequent encounter for fracture with routine healing: Secondary | ICD-10-CM | POA: Diagnosis not present

## 2022-03-30 DIAGNOSIS — E785 Hyperlipidemia, unspecified: Secondary | ICD-10-CM | POA: Diagnosis not present

## 2022-03-30 DIAGNOSIS — R4701 Aphasia: Secondary | ICD-10-CM | POA: Diagnosis not present

## 2022-03-30 DIAGNOSIS — E119 Type 2 diabetes mellitus without complications: Secondary | ICD-10-CM | POA: Diagnosis not present

## 2022-04-01 DIAGNOSIS — R4701 Aphasia: Secondary | ICD-10-CM | POA: Diagnosis not present

## 2022-04-01 DIAGNOSIS — S42202D Unspecified fracture of upper end of left humerus, subsequent encounter for fracture with routine healing: Secondary | ICD-10-CM | POA: Diagnosis not present

## 2022-04-01 DIAGNOSIS — E785 Hyperlipidemia, unspecified: Secondary | ICD-10-CM | POA: Diagnosis not present

## 2022-04-01 DIAGNOSIS — E119 Type 2 diabetes mellitus without complications: Secondary | ICD-10-CM | POA: Diagnosis not present

## 2022-04-01 DIAGNOSIS — K219 Gastro-esophageal reflux disease without esophagitis: Secondary | ICD-10-CM | POA: Diagnosis not present

## 2022-04-01 DIAGNOSIS — C711 Malignant neoplasm of frontal lobe: Secondary | ICD-10-CM | POA: Diagnosis not present

## 2022-04-06 DIAGNOSIS — E785 Hyperlipidemia, unspecified: Secondary | ICD-10-CM | POA: Diagnosis not present

## 2022-04-06 DIAGNOSIS — K219 Gastro-esophageal reflux disease without esophagitis: Secondary | ICD-10-CM | POA: Diagnosis not present

## 2022-04-06 DIAGNOSIS — C711 Malignant neoplasm of frontal lobe: Secondary | ICD-10-CM | POA: Diagnosis not present

## 2022-04-06 DIAGNOSIS — S42202D Unspecified fracture of upper end of left humerus, subsequent encounter for fracture with routine healing: Secondary | ICD-10-CM | POA: Diagnosis not present

## 2022-04-06 DIAGNOSIS — E119 Type 2 diabetes mellitus without complications: Secondary | ICD-10-CM | POA: Diagnosis not present

## 2022-04-06 DIAGNOSIS — R4701 Aphasia: Secondary | ICD-10-CM | POA: Diagnosis not present

## 2022-04-07 DIAGNOSIS — R4701 Aphasia: Secondary | ICD-10-CM | POA: Diagnosis not present

## 2022-04-07 DIAGNOSIS — C711 Malignant neoplasm of frontal lobe: Secondary | ICD-10-CM | POA: Diagnosis not present

## 2022-04-07 DIAGNOSIS — E119 Type 2 diabetes mellitus without complications: Secondary | ICD-10-CM | POA: Diagnosis not present

## 2022-04-07 DIAGNOSIS — E785 Hyperlipidemia, unspecified: Secondary | ICD-10-CM | POA: Diagnosis not present

## 2022-04-07 DIAGNOSIS — S42202D Unspecified fracture of upper end of left humerus, subsequent encounter for fracture with routine healing: Secondary | ICD-10-CM | POA: Diagnosis not present

## 2022-04-07 DIAGNOSIS — K219 Gastro-esophageal reflux disease without esophagitis: Secondary | ICD-10-CM | POA: Diagnosis not present

## 2022-04-08 DIAGNOSIS — S42202D Unspecified fracture of upper end of left humerus, subsequent encounter for fracture with routine healing: Secondary | ICD-10-CM | POA: Diagnosis not present

## 2022-04-08 DIAGNOSIS — E785 Hyperlipidemia, unspecified: Secondary | ICD-10-CM | POA: Diagnosis not present

## 2022-04-08 DIAGNOSIS — R4701 Aphasia: Secondary | ICD-10-CM | POA: Diagnosis not present

## 2022-04-08 DIAGNOSIS — C711 Malignant neoplasm of frontal lobe: Secondary | ICD-10-CM | POA: Diagnosis not present

## 2022-04-08 DIAGNOSIS — E119 Type 2 diabetes mellitus without complications: Secondary | ICD-10-CM | POA: Diagnosis not present

## 2022-04-08 DIAGNOSIS — K219 Gastro-esophageal reflux disease without esophagitis: Secondary | ICD-10-CM | POA: Diagnosis not present

## 2022-04-13 DIAGNOSIS — S42202D Unspecified fracture of upper end of left humerus, subsequent encounter for fracture with routine healing: Secondary | ICD-10-CM | POA: Diagnosis not present

## 2022-04-13 DIAGNOSIS — E785 Hyperlipidemia, unspecified: Secondary | ICD-10-CM | POA: Diagnosis not present

## 2022-04-13 DIAGNOSIS — C711 Malignant neoplasm of frontal lobe: Secondary | ICD-10-CM | POA: Diagnosis not present

## 2022-04-13 DIAGNOSIS — K219 Gastro-esophageal reflux disease without esophagitis: Secondary | ICD-10-CM | POA: Diagnosis not present

## 2022-04-13 DIAGNOSIS — E119 Type 2 diabetes mellitus without complications: Secondary | ICD-10-CM | POA: Diagnosis not present

## 2022-04-13 DIAGNOSIS — R4701 Aphasia: Secondary | ICD-10-CM | POA: Diagnosis not present

## 2022-04-15 DIAGNOSIS — E785 Hyperlipidemia, unspecified: Secondary | ICD-10-CM | POA: Diagnosis not present

## 2022-04-15 DIAGNOSIS — E119 Type 2 diabetes mellitus without complications: Secondary | ICD-10-CM | POA: Diagnosis not present

## 2022-04-15 DIAGNOSIS — C711 Malignant neoplasm of frontal lobe: Secondary | ICD-10-CM | POA: Diagnosis not present

## 2022-04-15 DIAGNOSIS — K219 Gastro-esophageal reflux disease without esophagitis: Secondary | ICD-10-CM | POA: Diagnosis not present

## 2022-04-15 DIAGNOSIS — R4701 Aphasia: Secondary | ICD-10-CM | POA: Diagnosis not present

## 2022-04-15 DIAGNOSIS — S42202D Unspecified fracture of upper end of left humerus, subsequent encounter for fracture with routine healing: Secondary | ICD-10-CM | POA: Diagnosis not present

## 2022-04-20 DIAGNOSIS — C711 Malignant neoplasm of frontal lobe: Secondary | ICD-10-CM | POA: Diagnosis not present

## 2022-04-20 DIAGNOSIS — E785 Hyperlipidemia, unspecified: Secondary | ICD-10-CM | POA: Diagnosis not present

## 2022-04-20 DIAGNOSIS — S42202D Unspecified fracture of upper end of left humerus, subsequent encounter for fracture with routine healing: Secondary | ICD-10-CM | POA: Diagnosis not present

## 2022-04-20 DIAGNOSIS — R4701 Aphasia: Secondary | ICD-10-CM | POA: Diagnosis not present

## 2022-04-20 DIAGNOSIS — E119 Type 2 diabetes mellitus without complications: Secondary | ICD-10-CM | POA: Diagnosis not present

## 2022-04-20 DIAGNOSIS — K219 Gastro-esophageal reflux disease without esophagitis: Secondary | ICD-10-CM | POA: Diagnosis not present

## 2022-04-22 DIAGNOSIS — E119 Type 2 diabetes mellitus without complications: Secondary | ICD-10-CM | POA: Diagnosis not present

## 2022-04-22 DIAGNOSIS — K219 Gastro-esophageal reflux disease without esophagitis: Secondary | ICD-10-CM | POA: Diagnosis not present

## 2022-04-22 DIAGNOSIS — C711 Malignant neoplasm of frontal lobe: Secondary | ICD-10-CM | POA: Diagnosis not present

## 2022-04-22 DIAGNOSIS — S42202D Unspecified fracture of upper end of left humerus, subsequent encounter for fracture with routine healing: Secondary | ICD-10-CM | POA: Diagnosis not present

## 2022-04-22 DIAGNOSIS — E785 Hyperlipidemia, unspecified: Secondary | ICD-10-CM | POA: Diagnosis not present

## 2022-04-22 DIAGNOSIS — R4701 Aphasia: Secondary | ICD-10-CM | POA: Diagnosis not present

## 2022-04-23 DIAGNOSIS — R4701 Aphasia: Secondary | ICD-10-CM | POA: Diagnosis not present

## 2022-04-23 DIAGNOSIS — G40909 Epilepsy, unspecified, not intractable, without status epilepticus: Secondary | ICD-10-CM | POA: Diagnosis not present

## 2022-04-23 DIAGNOSIS — J302 Other seasonal allergic rhinitis: Secondary | ICD-10-CM | POA: Diagnosis not present

## 2022-04-23 DIAGNOSIS — K219 Gastro-esophageal reflux disease without esophagitis: Secondary | ICD-10-CM | POA: Diagnosis not present

## 2022-04-23 DIAGNOSIS — Z741 Need for assistance with personal care: Secondary | ICD-10-CM | POA: Diagnosis not present

## 2022-04-23 DIAGNOSIS — G8194 Hemiplegia, unspecified affecting left nondominant side: Secondary | ICD-10-CM | POA: Diagnosis not present

## 2022-04-23 DIAGNOSIS — C711 Malignant neoplasm of frontal lobe: Secondary | ICD-10-CM | POA: Diagnosis not present

## 2022-04-23 DIAGNOSIS — E785 Hyperlipidemia, unspecified: Secondary | ICD-10-CM | POA: Diagnosis not present

## 2022-04-23 DIAGNOSIS — R159 Full incontinence of feces: Secondary | ICD-10-CM | POA: Diagnosis not present

## 2022-04-23 DIAGNOSIS — R296 Repeated falls: Secondary | ICD-10-CM | POA: Diagnosis not present

## 2022-04-23 DIAGNOSIS — R32 Unspecified urinary incontinence: Secondary | ICD-10-CM | POA: Diagnosis not present

## 2022-04-23 DIAGNOSIS — Z7401 Bed confinement status: Secondary | ICD-10-CM | POA: Diagnosis not present

## 2022-04-23 DIAGNOSIS — I10 Essential (primary) hypertension: Secondary | ICD-10-CM | POA: Diagnosis not present

## 2022-04-23 DIAGNOSIS — S01112D Laceration without foreign body of left eyelid and periocular area, subsequent encounter: Secondary | ICD-10-CM | POA: Diagnosis not present

## 2022-04-23 DIAGNOSIS — E039 Hypothyroidism, unspecified: Secondary | ICD-10-CM | POA: Diagnosis not present

## 2022-04-23 DIAGNOSIS — E119 Type 2 diabetes mellitus without complications: Secondary | ICD-10-CM | POA: Diagnosis not present

## 2022-04-23 DIAGNOSIS — S42202D Unspecified fracture of upper end of left humerus, subsequent encounter for fracture with routine healing: Secondary | ICD-10-CM | POA: Diagnosis not present

## 2022-04-24 DIAGNOSIS — S42202D Unspecified fracture of upper end of left humerus, subsequent encounter for fracture with routine healing: Secondary | ICD-10-CM | POA: Diagnosis not present

## 2022-04-24 DIAGNOSIS — E785 Hyperlipidemia, unspecified: Secondary | ICD-10-CM | POA: Diagnosis not present

## 2022-04-24 DIAGNOSIS — R4701 Aphasia: Secondary | ICD-10-CM | POA: Diagnosis not present

## 2022-04-24 DIAGNOSIS — C711 Malignant neoplasm of frontal lobe: Secondary | ICD-10-CM | POA: Diagnosis not present

## 2022-04-24 DIAGNOSIS — E119 Type 2 diabetes mellitus without complications: Secondary | ICD-10-CM | POA: Diagnosis not present

## 2022-04-24 DIAGNOSIS — K219 Gastro-esophageal reflux disease without esophagitis: Secondary | ICD-10-CM | POA: Diagnosis not present

## 2022-04-27 DIAGNOSIS — S42202D Unspecified fracture of upper end of left humerus, subsequent encounter for fracture with routine healing: Secondary | ICD-10-CM | POA: Diagnosis not present

## 2022-04-27 DIAGNOSIS — E785 Hyperlipidemia, unspecified: Secondary | ICD-10-CM | POA: Diagnosis not present

## 2022-04-27 DIAGNOSIS — E119 Type 2 diabetes mellitus without complications: Secondary | ICD-10-CM | POA: Diagnosis not present

## 2022-04-27 DIAGNOSIS — K219 Gastro-esophageal reflux disease without esophagitis: Secondary | ICD-10-CM | POA: Diagnosis not present

## 2022-04-27 DIAGNOSIS — C711 Malignant neoplasm of frontal lobe: Secondary | ICD-10-CM | POA: Diagnosis not present

## 2022-04-27 DIAGNOSIS — R4701 Aphasia: Secondary | ICD-10-CM | POA: Diagnosis not present

## 2022-04-29 ENCOUNTER — Other Ambulatory Visit (HOSPITAL_COMMUNITY): Payer: Self-pay

## 2022-04-29 DIAGNOSIS — E785 Hyperlipidemia, unspecified: Secondary | ICD-10-CM | POA: Diagnosis not present

## 2022-04-29 DIAGNOSIS — K219 Gastro-esophageal reflux disease without esophagitis: Secondary | ICD-10-CM | POA: Diagnosis not present

## 2022-04-29 DIAGNOSIS — E119 Type 2 diabetes mellitus without complications: Secondary | ICD-10-CM | POA: Diagnosis not present

## 2022-04-29 DIAGNOSIS — R4701 Aphasia: Secondary | ICD-10-CM | POA: Diagnosis not present

## 2022-04-29 DIAGNOSIS — S42202D Unspecified fracture of upper end of left humerus, subsequent encounter for fracture with routine healing: Secondary | ICD-10-CM | POA: Diagnosis not present

## 2022-04-29 DIAGNOSIS — C711 Malignant neoplasm of frontal lobe: Secondary | ICD-10-CM | POA: Diagnosis not present

## 2022-05-04 DIAGNOSIS — E785 Hyperlipidemia, unspecified: Secondary | ICD-10-CM | POA: Diagnosis not present

## 2022-05-04 DIAGNOSIS — R4701 Aphasia: Secondary | ICD-10-CM | POA: Diagnosis not present

## 2022-05-04 DIAGNOSIS — K219 Gastro-esophageal reflux disease without esophagitis: Secondary | ICD-10-CM | POA: Diagnosis not present

## 2022-05-04 DIAGNOSIS — C711 Malignant neoplasm of frontal lobe: Secondary | ICD-10-CM | POA: Diagnosis not present

## 2022-05-04 DIAGNOSIS — E119 Type 2 diabetes mellitus without complications: Secondary | ICD-10-CM | POA: Diagnosis not present

## 2022-05-04 DIAGNOSIS — S42202D Unspecified fracture of upper end of left humerus, subsequent encounter for fracture with routine healing: Secondary | ICD-10-CM | POA: Diagnosis not present

## 2022-05-06 DIAGNOSIS — E119 Type 2 diabetes mellitus without complications: Secondary | ICD-10-CM | POA: Diagnosis not present

## 2022-05-06 DIAGNOSIS — E785 Hyperlipidemia, unspecified: Secondary | ICD-10-CM | POA: Diagnosis not present

## 2022-05-06 DIAGNOSIS — R4701 Aphasia: Secondary | ICD-10-CM | POA: Diagnosis not present

## 2022-05-06 DIAGNOSIS — S42202D Unspecified fracture of upper end of left humerus, subsequent encounter for fracture with routine healing: Secondary | ICD-10-CM | POA: Diagnosis not present

## 2022-05-06 DIAGNOSIS — K219 Gastro-esophageal reflux disease without esophagitis: Secondary | ICD-10-CM | POA: Diagnosis not present

## 2022-05-06 DIAGNOSIS — C711 Malignant neoplasm of frontal lobe: Secondary | ICD-10-CM | POA: Diagnosis not present

## 2022-05-13 DIAGNOSIS — E119 Type 2 diabetes mellitus without complications: Secondary | ICD-10-CM | POA: Diagnosis not present

## 2022-05-13 DIAGNOSIS — C711 Malignant neoplasm of frontal lobe: Secondary | ICD-10-CM | POA: Diagnosis not present

## 2022-05-13 DIAGNOSIS — R4701 Aphasia: Secondary | ICD-10-CM | POA: Diagnosis not present

## 2022-05-13 DIAGNOSIS — K219 Gastro-esophageal reflux disease without esophagitis: Secondary | ICD-10-CM | POA: Diagnosis not present

## 2022-05-13 DIAGNOSIS — E785 Hyperlipidemia, unspecified: Secondary | ICD-10-CM | POA: Diagnosis not present

## 2022-05-13 DIAGNOSIS — S42202D Unspecified fracture of upper end of left humerus, subsequent encounter for fracture with routine healing: Secondary | ICD-10-CM | POA: Diagnosis not present

## 2022-05-14 DIAGNOSIS — R4701 Aphasia: Secondary | ICD-10-CM | POA: Diagnosis not present

## 2022-05-14 DIAGNOSIS — E785 Hyperlipidemia, unspecified: Secondary | ICD-10-CM | POA: Diagnosis not present

## 2022-05-14 DIAGNOSIS — C711 Malignant neoplasm of frontal lobe: Secondary | ICD-10-CM | POA: Diagnosis not present

## 2022-05-14 DIAGNOSIS — S42202D Unspecified fracture of upper end of left humerus, subsequent encounter for fracture with routine healing: Secondary | ICD-10-CM | POA: Diagnosis not present

## 2022-05-14 DIAGNOSIS — E119 Type 2 diabetes mellitus without complications: Secondary | ICD-10-CM | POA: Diagnosis not present

## 2022-05-14 DIAGNOSIS — K219 Gastro-esophageal reflux disease without esophagitis: Secondary | ICD-10-CM | POA: Diagnosis not present

## 2022-05-18 DIAGNOSIS — R4701 Aphasia: Secondary | ICD-10-CM | POA: Diagnosis not present

## 2022-05-18 DIAGNOSIS — S42202D Unspecified fracture of upper end of left humerus, subsequent encounter for fracture with routine healing: Secondary | ICD-10-CM | POA: Diagnosis not present

## 2022-05-18 DIAGNOSIS — E119 Type 2 diabetes mellitus without complications: Secondary | ICD-10-CM | POA: Diagnosis not present

## 2022-05-18 DIAGNOSIS — E785 Hyperlipidemia, unspecified: Secondary | ICD-10-CM | POA: Diagnosis not present

## 2022-05-18 DIAGNOSIS — C711 Malignant neoplasm of frontal lobe: Secondary | ICD-10-CM | POA: Diagnosis not present

## 2022-05-18 DIAGNOSIS — K219 Gastro-esophageal reflux disease without esophagitis: Secondary | ICD-10-CM | POA: Diagnosis not present

## 2022-05-20 DIAGNOSIS — E785 Hyperlipidemia, unspecified: Secondary | ICD-10-CM | POA: Diagnosis not present

## 2022-05-20 DIAGNOSIS — C711 Malignant neoplasm of frontal lobe: Secondary | ICD-10-CM | POA: Diagnosis not present

## 2022-05-20 DIAGNOSIS — S42202D Unspecified fracture of upper end of left humerus, subsequent encounter for fracture with routine healing: Secondary | ICD-10-CM | POA: Diagnosis not present

## 2022-05-20 DIAGNOSIS — E119 Type 2 diabetes mellitus without complications: Secondary | ICD-10-CM | POA: Diagnosis not present

## 2022-05-20 DIAGNOSIS — R4701 Aphasia: Secondary | ICD-10-CM | POA: Diagnosis not present

## 2022-05-20 DIAGNOSIS — K219 Gastro-esophageal reflux disease without esophagitis: Secondary | ICD-10-CM | POA: Diagnosis not present

## 2022-05-22 DIAGNOSIS — Z7401 Bed confinement status: Secondary | ICD-10-CM | POA: Diagnosis not present

## 2022-05-22 DIAGNOSIS — G40909 Epilepsy, unspecified, not intractable, without status epilepticus: Secondary | ICD-10-CM | POA: Diagnosis not present

## 2022-05-22 DIAGNOSIS — E119 Type 2 diabetes mellitus without complications: Secondary | ICD-10-CM | POA: Diagnosis not present

## 2022-05-22 DIAGNOSIS — E039 Hypothyroidism, unspecified: Secondary | ICD-10-CM | POA: Diagnosis not present

## 2022-05-22 DIAGNOSIS — R32 Unspecified urinary incontinence: Secondary | ICD-10-CM | POA: Diagnosis not present

## 2022-05-22 DIAGNOSIS — K219 Gastro-esophageal reflux disease without esophagitis: Secondary | ICD-10-CM | POA: Diagnosis not present

## 2022-05-22 DIAGNOSIS — J302 Other seasonal allergic rhinitis: Secondary | ICD-10-CM | POA: Diagnosis not present

## 2022-05-22 DIAGNOSIS — C711 Malignant neoplasm of frontal lobe: Secondary | ICD-10-CM | POA: Diagnosis not present

## 2022-05-22 DIAGNOSIS — R296 Repeated falls: Secondary | ICD-10-CM | POA: Diagnosis not present

## 2022-05-22 DIAGNOSIS — R159 Full incontinence of feces: Secondary | ICD-10-CM | POA: Diagnosis not present

## 2022-05-22 DIAGNOSIS — S01112D Laceration without foreign body of left eyelid and periocular area, subsequent encounter: Secondary | ICD-10-CM | POA: Diagnosis not present

## 2022-05-22 DIAGNOSIS — R4701 Aphasia: Secondary | ICD-10-CM | POA: Diagnosis not present

## 2022-05-22 DIAGNOSIS — E785 Hyperlipidemia, unspecified: Secondary | ICD-10-CM | POA: Diagnosis not present

## 2022-05-22 DIAGNOSIS — S42202D Unspecified fracture of upper end of left humerus, subsequent encounter for fracture with routine healing: Secondary | ICD-10-CM | POA: Diagnosis not present

## 2022-05-22 DIAGNOSIS — G8194 Hemiplegia, unspecified affecting left nondominant side: Secondary | ICD-10-CM | POA: Diagnosis not present

## 2022-05-22 DIAGNOSIS — I10 Essential (primary) hypertension: Secondary | ICD-10-CM | POA: Diagnosis not present

## 2022-05-22 DIAGNOSIS — Z741 Need for assistance with personal care: Secondary | ICD-10-CM | POA: Diagnosis not present

## 2022-05-25 DIAGNOSIS — E119 Type 2 diabetes mellitus without complications: Secondary | ICD-10-CM | POA: Diagnosis not present

## 2022-05-25 DIAGNOSIS — E785 Hyperlipidemia, unspecified: Secondary | ICD-10-CM | POA: Diagnosis not present

## 2022-05-25 DIAGNOSIS — R4701 Aphasia: Secondary | ICD-10-CM | POA: Diagnosis not present

## 2022-05-25 DIAGNOSIS — S42202D Unspecified fracture of upper end of left humerus, subsequent encounter for fracture with routine healing: Secondary | ICD-10-CM | POA: Diagnosis not present

## 2022-05-25 DIAGNOSIS — K219 Gastro-esophageal reflux disease without esophagitis: Secondary | ICD-10-CM | POA: Diagnosis not present

## 2022-05-25 DIAGNOSIS — C711 Malignant neoplasm of frontal lobe: Secondary | ICD-10-CM | POA: Diagnosis not present

## 2022-05-27 DIAGNOSIS — E119 Type 2 diabetes mellitus without complications: Secondary | ICD-10-CM | POA: Diagnosis not present

## 2022-05-27 DIAGNOSIS — C711 Malignant neoplasm of frontal lobe: Secondary | ICD-10-CM | POA: Diagnosis not present

## 2022-05-27 DIAGNOSIS — R4701 Aphasia: Secondary | ICD-10-CM | POA: Diagnosis not present

## 2022-05-27 DIAGNOSIS — E785 Hyperlipidemia, unspecified: Secondary | ICD-10-CM | POA: Diagnosis not present

## 2022-05-27 DIAGNOSIS — S42202D Unspecified fracture of upper end of left humerus, subsequent encounter for fracture with routine healing: Secondary | ICD-10-CM | POA: Diagnosis not present

## 2022-05-27 DIAGNOSIS — K219 Gastro-esophageal reflux disease without esophagitis: Secondary | ICD-10-CM | POA: Diagnosis not present

## 2022-05-28 DIAGNOSIS — C711 Malignant neoplasm of frontal lobe: Secondary | ICD-10-CM | POA: Diagnosis not present

## 2022-05-28 DIAGNOSIS — E119 Type 2 diabetes mellitus without complications: Secondary | ICD-10-CM | POA: Diagnosis not present

## 2022-05-28 DIAGNOSIS — E785 Hyperlipidemia, unspecified: Secondary | ICD-10-CM | POA: Diagnosis not present

## 2022-05-28 DIAGNOSIS — R4701 Aphasia: Secondary | ICD-10-CM | POA: Diagnosis not present

## 2022-05-28 DIAGNOSIS — K219 Gastro-esophageal reflux disease without esophagitis: Secondary | ICD-10-CM | POA: Diagnosis not present

## 2022-05-28 DIAGNOSIS — S42202D Unspecified fracture of upper end of left humerus, subsequent encounter for fracture with routine healing: Secondary | ICD-10-CM | POA: Diagnosis not present

## 2022-05-29 DIAGNOSIS — S42202D Unspecified fracture of upper end of left humerus, subsequent encounter for fracture with routine healing: Secondary | ICD-10-CM | POA: Diagnosis not present

## 2022-05-29 DIAGNOSIS — R4701 Aphasia: Secondary | ICD-10-CM | POA: Diagnosis not present

## 2022-05-29 DIAGNOSIS — C711 Malignant neoplasm of frontal lobe: Secondary | ICD-10-CM | POA: Diagnosis not present

## 2022-05-29 DIAGNOSIS — E119 Type 2 diabetes mellitus without complications: Secondary | ICD-10-CM | POA: Diagnosis not present

## 2022-05-29 DIAGNOSIS — E785 Hyperlipidemia, unspecified: Secondary | ICD-10-CM | POA: Diagnosis not present

## 2022-05-29 DIAGNOSIS — K219 Gastro-esophageal reflux disease without esophagitis: Secondary | ICD-10-CM | POA: Diagnosis not present

## 2022-05-30 DIAGNOSIS — R4701 Aphasia: Secondary | ICD-10-CM | POA: Diagnosis not present

## 2022-05-30 DIAGNOSIS — K219 Gastro-esophageal reflux disease without esophagitis: Secondary | ICD-10-CM | POA: Diagnosis not present

## 2022-05-30 DIAGNOSIS — C711 Malignant neoplasm of frontal lobe: Secondary | ICD-10-CM | POA: Diagnosis not present

## 2022-05-30 DIAGNOSIS — E119 Type 2 diabetes mellitus without complications: Secondary | ICD-10-CM | POA: Diagnosis not present

## 2022-05-30 DIAGNOSIS — E785 Hyperlipidemia, unspecified: Secondary | ICD-10-CM | POA: Diagnosis not present

## 2022-05-30 DIAGNOSIS — S42202D Unspecified fracture of upper end of left humerus, subsequent encounter for fracture with routine healing: Secondary | ICD-10-CM | POA: Diagnosis not present

## 2022-05-31 DIAGNOSIS — R4701 Aphasia: Secondary | ICD-10-CM | POA: Diagnosis not present

## 2022-05-31 DIAGNOSIS — S42202D Unspecified fracture of upper end of left humerus, subsequent encounter for fracture with routine healing: Secondary | ICD-10-CM | POA: Diagnosis not present

## 2022-05-31 DIAGNOSIS — C711 Malignant neoplasm of frontal lobe: Secondary | ICD-10-CM | POA: Diagnosis not present

## 2022-05-31 DIAGNOSIS — E119 Type 2 diabetes mellitus without complications: Secondary | ICD-10-CM | POA: Diagnosis not present

## 2022-05-31 DIAGNOSIS — K219 Gastro-esophageal reflux disease without esophagitis: Secondary | ICD-10-CM | POA: Diagnosis not present

## 2022-05-31 DIAGNOSIS — E785 Hyperlipidemia, unspecified: Secondary | ICD-10-CM | POA: Diagnosis not present

## 2022-06-01 DIAGNOSIS — C711 Malignant neoplasm of frontal lobe: Secondary | ICD-10-CM | POA: Diagnosis not present

## 2022-06-01 DIAGNOSIS — S42202D Unspecified fracture of upper end of left humerus, subsequent encounter for fracture with routine healing: Secondary | ICD-10-CM | POA: Diagnosis not present

## 2022-06-01 DIAGNOSIS — K219 Gastro-esophageal reflux disease without esophagitis: Secondary | ICD-10-CM | POA: Diagnosis not present

## 2022-06-01 DIAGNOSIS — R4701 Aphasia: Secondary | ICD-10-CM | POA: Diagnosis not present

## 2022-06-01 DIAGNOSIS — E785 Hyperlipidemia, unspecified: Secondary | ICD-10-CM | POA: Diagnosis not present

## 2022-06-01 DIAGNOSIS — E119 Type 2 diabetes mellitus without complications: Secondary | ICD-10-CM | POA: Diagnosis not present

## 2022-06-02 DIAGNOSIS — E785 Hyperlipidemia, unspecified: Secondary | ICD-10-CM | POA: Diagnosis not present

## 2022-06-02 DIAGNOSIS — S42202D Unspecified fracture of upper end of left humerus, subsequent encounter for fracture with routine healing: Secondary | ICD-10-CM | POA: Diagnosis not present

## 2022-06-02 DIAGNOSIS — E119 Type 2 diabetes mellitus without complications: Secondary | ICD-10-CM | POA: Diagnosis not present

## 2022-06-02 DIAGNOSIS — C711 Malignant neoplasm of frontal lobe: Secondary | ICD-10-CM | POA: Diagnosis not present

## 2022-06-02 DIAGNOSIS — K219 Gastro-esophageal reflux disease without esophagitis: Secondary | ICD-10-CM | POA: Diagnosis not present

## 2022-06-02 DIAGNOSIS — R4701 Aphasia: Secondary | ICD-10-CM | POA: Diagnosis not present

## 2022-06-03 DIAGNOSIS — C711 Malignant neoplasm of frontal lobe: Secondary | ICD-10-CM | POA: Diagnosis not present

## 2022-06-03 DIAGNOSIS — S42202D Unspecified fracture of upper end of left humerus, subsequent encounter for fracture with routine healing: Secondary | ICD-10-CM | POA: Diagnosis not present

## 2022-06-03 DIAGNOSIS — K219 Gastro-esophageal reflux disease without esophagitis: Secondary | ICD-10-CM | POA: Diagnosis not present

## 2022-06-03 DIAGNOSIS — R4701 Aphasia: Secondary | ICD-10-CM | POA: Diagnosis not present

## 2022-06-03 DIAGNOSIS — E785 Hyperlipidemia, unspecified: Secondary | ICD-10-CM | POA: Diagnosis not present

## 2022-06-03 DIAGNOSIS — E119 Type 2 diabetes mellitus without complications: Secondary | ICD-10-CM | POA: Diagnosis not present

## 2022-06-08 DIAGNOSIS — R4701 Aphasia: Secondary | ICD-10-CM | POA: Diagnosis not present

## 2022-06-08 DIAGNOSIS — E785 Hyperlipidemia, unspecified: Secondary | ICD-10-CM | POA: Diagnosis not present

## 2022-06-08 DIAGNOSIS — K219 Gastro-esophageal reflux disease without esophagitis: Secondary | ICD-10-CM | POA: Diagnosis not present

## 2022-06-08 DIAGNOSIS — S42202D Unspecified fracture of upper end of left humerus, subsequent encounter for fracture with routine healing: Secondary | ICD-10-CM | POA: Diagnosis not present

## 2022-06-08 DIAGNOSIS — E119 Type 2 diabetes mellitus without complications: Secondary | ICD-10-CM | POA: Diagnosis not present

## 2022-06-08 DIAGNOSIS — C711 Malignant neoplasm of frontal lobe: Secondary | ICD-10-CM | POA: Diagnosis not present

## 2022-06-10 DIAGNOSIS — S42202D Unspecified fracture of upper end of left humerus, subsequent encounter for fracture with routine healing: Secondary | ICD-10-CM | POA: Diagnosis not present

## 2022-06-10 DIAGNOSIS — K219 Gastro-esophageal reflux disease without esophagitis: Secondary | ICD-10-CM | POA: Diagnosis not present

## 2022-06-10 DIAGNOSIS — E785 Hyperlipidemia, unspecified: Secondary | ICD-10-CM | POA: Diagnosis not present

## 2022-06-10 DIAGNOSIS — E119 Type 2 diabetes mellitus without complications: Secondary | ICD-10-CM | POA: Diagnosis not present

## 2022-06-10 DIAGNOSIS — C711 Malignant neoplasm of frontal lobe: Secondary | ICD-10-CM | POA: Diagnosis not present

## 2022-06-10 DIAGNOSIS — R4701 Aphasia: Secondary | ICD-10-CM | POA: Diagnosis not present

## 2022-06-15 DIAGNOSIS — E785 Hyperlipidemia, unspecified: Secondary | ICD-10-CM | POA: Diagnosis not present

## 2022-06-15 DIAGNOSIS — E119 Type 2 diabetes mellitus without complications: Secondary | ICD-10-CM | POA: Diagnosis not present

## 2022-06-15 DIAGNOSIS — K219 Gastro-esophageal reflux disease without esophagitis: Secondary | ICD-10-CM | POA: Diagnosis not present

## 2022-06-15 DIAGNOSIS — R4701 Aphasia: Secondary | ICD-10-CM | POA: Diagnosis not present

## 2022-06-15 DIAGNOSIS — C711 Malignant neoplasm of frontal lobe: Secondary | ICD-10-CM | POA: Diagnosis not present

## 2022-06-15 DIAGNOSIS — S42202D Unspecified fracture of upper end of left humerus, subsequent encounter for fracture with routine healing: Secondary | ICD-10-CM | POA: Diagnosis not present

## 2022-06-17 DIAGNOSIS — S42202D Unspecified fracture of upper end of left humerus, subsequent encounter for fracture with routine healing: Secondary | ICD-10-CM | POA: Diagnosis not present

## 2022-06-17 DIAGNOSIS — R4701 Aphasia: Secondary | ICD-10-CM | POA: Diagnosis not present

## 2022-06-17 DIAGNOSIS — E119 Type 2 diabetes mellitus without complications: Secondary | ICD-10-CM | POA: Diagnosis not present

## 2022-06-17 DIAGNOSIS — C711 Malignant neoplasm of frontal lobe: Secondary | ICD-10-CM | POA: Diagnosis not present

## 2022-06-17 DIAGNOSIS — E785 Hyperlipidemia, unspecified: Secondary | ICD-10-CM | POA: Diagnosis not present

## 2022-06-17 DIAGNOSIS — K219 Gastro-esophageal reflux disease without esophagitis: Secondary | ICD-10-CM | POA: Diagnosis not present

## 2022-06-22 DIAGNOSIS — S42202D Unspecified fracture of upper end of left humerus, subsequent encounter for fracture with routine healing: Secondary | ICD-10-CM | POA: Diagnosis not present

## 2022-06-22 DIAGNOSIS — G40909 Epilepsy, unspecified, not intractable, without status epilepticus: Secondary | ICD-10-CM | POA: Diagnosis not present

## 2022-06-22 DIAGNOSIS — Z7401 Bed confinement status: Secondary | ICD-10-CM | POA: Diagnosis not present

## 2022-06-22 DIAGNOSIS — R32 Unspecified urinary incontinence: Secondary | ICD-10-CM | POA: Diagnosis not present

## 2022-06-22 DIAGNOSIS — R296 Repeated falls: Secondary | ICD-10-CM | POA: Diagnosis not present

## 2022-06-22 DIAGNOSIS — E119 Type 2 diabetes mellitus without complications: Secondary | ICD-10-CM | POA: Diagnosis not present

## 2022-06-22 DIAGNOSIS — J302 Other seasonal allergic rhinitis: Secondary | ICD-10-CM | POA: Diagnosis not present

## 2022-06-22 DIAGNOSIS — E785 Hyperlipidemia, unspecified: Secondary | ICD-10-CM | POA: Diagnosis not present

## 2022-06-22 DIAGNOSIS — K219 Gastro-esophageal reflux disease without esophagitis: Secondary | ICD-10-CM | POA: Diagnosis not present

## 2022-06-22 DIAGNOSIS — G8194 Hemiplegia, unspecified affecting left nondominant side: Secondary | ICD-10-CM | POA: Diagnosis not present

## 2022-06-22 DIAGNOSIS — C711 Malignant neoplasm of frontal lobe: Secondary | ICD-10-CM | POA: Diagnosis not present

## 2022-06-22 DIAGNOSIS — R4701 Aphasia: Secondary | ICD-10-CM | POA: Diagnosis not present

## 2022-06-22 DIAGNOSIS — S01112D Laceration without foreign body of left eyelid and periocular area, subsequent encounter: Secondary | ICD-10-CM | POA: Diagnosis not present

## 2022-06-22 DIAGNOSIS — Z741 Need for assistance with personal care: Secondary | ICD-10-CM | POA: Diagnosis not present

## 2022-06-22 DIAGNOSIS — E039 Hypothyroidism, unspecified: Secondary | ICD-10-CM | POA: Diagnosis not present

## 2022-06-22 DIAGNOSIS — R159 Full incontinence of feces: Secondary | ICD-10-CM | POA: Diagnosis not present

## 2022-06-22 DIAGNOSIS — I10 Essential (primary) hypertension: Secondary | ICD-10-CM | POA: Diagnosis not present

## 2022-06-24 DIAGNOSIS — K219 Gastro-esophageal reflux disease without esophagitis: Secondary | ICD-10-CM | POA: Diagnosis not present

## 2022-06-24 DIAGNOSIS — R4701 Aphasia: Secondary | ICD-10-CM | POA: Diagnosis not present

## 2022-06-24 DIAGNOSIS — E119 Type 2 diabetes mellitus without complications: Secondary | ICD-10-CM | POA: Diagnosis not present

## 2022-06-24 DIAGNOSIS — C711 Malignant neoplasm of frontal lobe: Secondary | ICD-10-CM | POA: Diagnosis not present

## 2022-06-24 DIAGNOSIS — S42202D Unspecified fracture of upper end of left humerus, subsequent encounter for fracture with routine healing: Secondary | ICD-10-CM | POA: Diagnosis not present

## 2022-06-24 DIAGNOSIS — E785 Hyperlipidemia, unspecified: Secondary | ICD-10-CM | POA: Diagnosis not present

## 2022-06-26 DIAGNOSIS — E119 Type 2 diabetes mellitus without complications: Secondary | ICD-10-CM | POA: Diagnosis not present

## 2022-06-26 DIAGNOSIS — C711 Malignant neoplasm of frontal lobe: Secondary | ICD-10-CM | POA: Diagnosis not present

## 2022-06-26 DIAGNOSIS — E785 Hyperlipidemia, unspecified: Secondary | ICD-10-CM | POA: Diagnosis not present

## 2022-06-26 DIAGNOSIS — K219 Gastro-esophageal reflux disease without esophagitis: Secondary | ICD-10-CM | POA: Diagnosis not present

## 2022-06-26 DIAGNOSIS — R4701 Aphasia: Secondary | ICD-10-CM | POA: Diagnosis not present

## 2022-06-26 DIAGNOSIS — S42202D Unspecified fracture of upper end of left humerus, subsequent encounter for fracture with routine healing: Secondary | ICD-10-CM | POA: Diagnosis not present

## 2022-06-29 DIAGNOSIS — E785 Hyperlipidemia, unspecified: Secondary | ICD-10-CM | POA: Diagnosis not present

## 2022-06-29 DIAGNOSIS — R4701 Aphasia: Secondary | ICD-10-CM | POA: Diagnosis not present

## 2022-06-29 DIAGNOSIS — E119 Type 2 diabetes mellitus without complications: Secondary | ICD-10-CM | POA: Diagnosis not present

## 2022-06-29 DIAGNOSIS — S42202D Unspecified fracture of upper end of left humerus, subsequent encounter for fracture with routine healing: Secondary | ICD-10-CM | POA: Diagnosis not present

## 2022-06-29 DIAGNOSIS — C711 Malignant neoplasm of frontal lobe: Secondary | ICD-10-CM | POA: Diagnosis not present

## 2022-06-29 DIAGNOSIS — K219 Gastro-esophageal reflux disease without esophagitis: Secondary | ICD-10-CM | POA: Diagnosis not present

## 2022-07-01 DIAGNOSIS — R4701 Aphasia: Secondary | ICD-10-CM | POA: Diagnosis not present

## 2022-07-01 DIAGNOSIS — E119 Type 2 diabetes mellitus without complications: Secondary | ICD-10-CM | POA: Diagnosis not present

## 2022-07-01 DIAGNOSIS — C711 Malignant neoplasm of frontal lobe: Secondary | ICD-10-CM | POA: Diagnosis not present

## 2022-07-01 DIAGNOSIS — S42202D Unspecified fracture of upper end of left humerus, subsequent encounter for fracture with routine healing: Secondary | ICD-10-CM | POA: Diagnosis not present

## 2022-07-01 DIAGNOSIS — K219 Gastro-esophageal reflux disease without esophagitis: Secondary | ICD-10-CM | POA: Diagnosis not present

## 2022-07-01 DIAGNOSIS — E785 Hyperlipidemia, unspecified: Secondary | ICD-10-CM | POA: Diagnosis not present

## 2022-07-06 DIAGNOSIS — R4701 Aphasia: Secondary | ICD-10-CM | POA: Diagnosis not present

## 2022-07-06 DIAGNOSIS — C711 Malignant neoplasm of frontal lobe: Secondary | ICD-10-CM | POA: Diagnosis not present

## 2022-07-06 DIAGNOSIS — E119 Type 2 diabetes mellitus without complications: Secondary | ICD-10-CM | POA: Diagnosis not present

## 2022-07-06 DIAGNOSIS — K219 Gastro-esophageal reflux disease without esophagitis: Secondary | ICD-10-CM | POA: Diagnosis not present

## 2022-07-06 DIAGNOSIS — S42202D Unspecified fracture of upper end of left humerus, subsequent encounter for fracture with routine healing: Secondary | ICD-10-CM | POA: Diagnosis not present

## 2022-07-06 DIAGNOSIS — E785 Hyperlipidemia, unspecified: Secondary | ICD-10-CM | POA: Diagnosis not present

## 2022-07-08 DIAGNOSIS — S42202D Unspecified fracture of upper end of left humerus, subsequent encounter for fracture with routine healing: Secondary | ICD-10-CM | POA: Diagnosis not present

## 2022-07-08 DIAGNOSIS — K219 Gastro-esophageal reflux disease without esophagitis: Secondary | ICD-10-CM | POA: Diagnosis not present

## 2022-07-08 DIAGNOSIS — E119 Type 2 diabetes mellitus without complications: Secondary | ICD-10-CM | POA: Diagnosis not present

## 2022-07-08 DIAGNOSIS — E785 Hyperlipidemia, unspecified: Secondary | ICD-10-CM | POA: Diagnosis not present

## 2022-07-08 DIAGNOSIS — R4701 Aphasia: Secondary | ICD-10-CM | POA: Diagnosis not present

## 2022-07-08 DIAGNOSIS — C711 Malignant neoplasm of frontal lobe: Secondary | ICD-10-CM | POA: Diagnosis not present

## 2022-07-13 DIAGNOSIS — K219 Gastro-esophageal reflux disease without esophagitis: Secondary | ICD-10-CM | POA: Diagnosis not present

## 2022-07-13 DIAGNOSIS — E785 Hyperlipidemia, unspecified: Secondary | ICD-10-CM | POA: Diagnosis not present

## 2022-07-13 DIAGNOSIS — R4701 Aphasia: Secondary | ICD-10-CM | POA: Diagnosis not present

## 2022-07-13 DIAGNOSIS — S42202D Unspecified fracture of upper end of left humerus, subsequent encounter for fracture with routine healing: Secondary | ICD-10-CM | POA: Diagnosis not present

## 2022-07-13 DIAGNOSIS — C711 Malignant neoplasm of frontal lobe: Secondary | ICD-10-CM | POA: Diagnosis not present

## 2022-07-13 DIAGNOSIS — E119 Type 2 diabetes mellitus without complications: Secondary | ICD-10-CM | POA: Diagnosis not present

## 2022-07-15 DIAGNOSIS — S42202D Unspecified fracture of upper end of left humerus, subsequent encounter for fracture with routine healing: Secondary | ICD-10-CM | POA: Diagnosis not present

## 2022-07-15 DIAGNOSIS — C711 Malignant neoplasm of frontal lobe: Secondary | ICD-10-CM | POA: Diagnosis not present

## 2022-07-15 DIAGNOSIS — E785 Hyperlipidemia, unspecified: Secondary | ICD-10-CM | POA: Diagnosis not present

## 2022-07-15 DIAGNOSIS — R4701 Aphasia: Secondary | ICD-10-CM | POA: Diagnosis not present

## 2022-07-15 DIAGNOSIS — K219 Gastro-esophageal reflux disease without esophagitis: Secondary | ICD-10-CM | POA: Diagnosis not present

## 2022-07-15 DIAGNOSIS — E119 Type 2 diabetes mellitus without complications: Secondary | ICD-10-CM | POA: Diagnosis not present

## 2022-07-20 DIAGNOSIS — R4701 Aphasia: Secondary | ICD-10-CM | POA: Diagnosis not present

## 2022-07-20 DIAGNOSIS — E119 Type 2 diabetes mellitus without complications: Secondary | ICD-10-CM | POA: Diagnosis not present

## 2022-07-20 DIAGNOSIS — E785 Hyperlipidemia, unspecified: Secondary | ICD-10-CM | POA: Diagnosis not present

## 2022-07-20 DIAGNOSIS — C711 Malignant neoplasm of frontal lobe: Secondary | ICD-10-CM | POA: Diagnosis not present

## 2022-07-20 DIAGNOSIS — K219 Gastro-esophageal reflux disease without esophagitis: Secondary | ICD-10-CM | POA: Diagnosis not present

## 2022-07-20 DIAGNOSIS — S42202D Unspecified fracture of upper end of left humerus, subsequent encounter for fracture with routine healing: Secondary | ICD-10-CM | POA: Diagnosis not present

## 2022-07-22 DIAGNOSIS — K219 Gastro-esophageal reflux disease without esophagitis: Secondary | ICD-10-CM | POA: Diagnosis not present

## 2022-07-22 DIAGNOSIS — R296 Repeated falls: Secondary | ICD-10-CM | POA: Diagnosis not present

## 2022-07-22 DIAGNOSIS — J302 Other seasonal allergic rhinitis: Secondary | ICD-10-CM | POA: Diagnosis not present

## 2022-07-22 DIAGNOSIS — Z741 Need for assistance with personal care: Secondary | ICD-10-CM | POA: Diagnosis not present

## 2022-07-22 DIAGNOSIS — R32 Unspecified urinary incontinence: Secondary | ICD-10-CM | POA: Diagnosis not present

## 2022-07-22 DIAGNOSIS — S01112D Laceration without foreign body of left eyelid and periocular area, subsequent encounter: Secondary | ICD-10-CM | POA: Diagnosis not present

## 2022-07-22 DIAGNOSIS — I10 Essential (primary) hypertension: Secondary | ICD-10-CM | POA: Diagnosis not present

## 2022-07-22 DIAGNOSIS — R4701 Aphasia: Secondary | ICD-10-CM | POA: Diagnosis not present

## 2022-07-22 DIAGNOSIS — C711 Malignant neoplasm of frontal lobe: Secondary | ICD-10-CM | POA: Diagnosis not present

## 2022-07-22 DIAGNOSIS — E119 Type 2 diabetes mellitus without complications: Secondary | ICD-10-CM | POA: Diagnosis not present

## 2022-07-22 DIAGNOSIS — Z7401 Bed confinement status: Secondary | ICD-10-CM | POA: Diagnosis not present

## 2022-07-22 DIAGNOSIS — E039 Hypothyroidism, unspecified: Secondary | ICD-10-CM | POA: Diagnosis not present

## 2022-07-22 DIAGNOSIS — E785 Hyperlipidemia, unspecified: Secondary | ICD-10-CM | POA: Diagnosis not present

## 2022-07-22 DIAGNOSIS — G8194 Hemiplegia, unspecified affecting left nondominant side: Secondary | ICD-10-CM | POA: Diagnosis not present

## 2022-07-22 DIAGNOSIS — R159 Full incontinence of feces: Secondary | ICD-10-CM | POA: Diagnosis not present

## 2022-07-22 DIAGNOSIS — G40909 Epilepsy, unspecified, not intractable, without status epilepticus: Secondary | ICD-10-CM | POA: Diagnosis not present

## 2022-07-22 DIAGNOSIS — S42202D Unspecified fracture of upper end of left humerus, subsequent encounter for fracture with routine healing: Secondary | ICD-10-CM | POA: Diagnosis not present

## 2022-07-27 DIAGNOSIS — R4701 Aphasia: Secondary | ICD-10-CM | POA: Diagnosis not present

## 2022-07-27 DIAGNOSIS — E785 Hyperlipidemia, unspecified: Secondary | ICD-10-CM | POA: Diagnosis not present

## 2022-07-27 DIAGNOSIS — E119 Type 2 diabetes mellitus without complications: Secondary | ICD-10-CM | POA: Diagnosis not present

## 2022-07-27 DIAGNOSIS — S42202D Unspecified fracture of upper end of left humerus, subsequent encounter for fracture with routine healing: Secondary | ICD-10-CM | POA: Diagnosis not present

## 2022-07-27 DIAGNOSIS — K219 Gastro-esophageal reflux disease without esophagitis: Secondary | ICD-10-CM | POA: Diagnosis not present

## 2022-07-27 DIAGNOSIS — C711 Malignant neoplasm of frontal lobe: Secondary | ICD-10-CM | POA: Diagnosis not present

## 2022-07-30 DIAGNOSIS — R4701 Aphasia: Secondary | ICD-10-CM | POA: Diagnosis not present

## 2022-07-30 DIAGNOSIS — S42202D Unspecified fracture of upper end of left humerus, subsequent encounter for fracture with routine healing: Secondary | ICD-10-CM | POA: Diagnosis not present

## 2022-07-30 DIAGNOSIS — E785 Hyperlipidemia, unspecified: Secondary | ICD-10-CM | POA: Diagnosis not present

## 2022-07-30 DIAGNOSIS — K219 Gastro-esophageal reflux disease without esophagitis: Secondary | ICD-10-CM | POA: Diagnosis not present

## 2022-07-30 DIAGNOSIS — E119 Type 2 diabetes mellitus without complications: Secondary | ICD-10-CM | POA: Diagnosis not present

## 2022-07-30 DIAGNOSIS — C711 Malignant neoplasm of frontal lobe: Secondary | ICD-10-CM | POA: Diagnosis not present

## 2022-08-03 DIAGNOSIS — C711 Malignant neoplasm of frontal lobe: Secondary | ICD-10-CM | POA: Diagnosis not present

## 2022-08-03 DIAGNOSIS — E785 Hyperlipidemia, unspecified: Secondary | ICD-10-CM | POA: Diagnosis not present

## 2022-08-03 DIAGNOSIS — K219 Gastro-esophageal reflux disease without esophagitis: Secondary | ICD-10-CM | POA: Diagnosis not present

## 2022-08-03 DIAGNOSIS — R4701 Aphasia: Secondary | ICD-10-CM | POA: Diagnosis not present

## 2022-08-03 DIAGNOSIS — E119 Type 2 diabetes mellitus without complications: Secondary | ICD-10-CM | POA: Diagnosis not present

## 2022-08-03 DIAGNOSIS — S42202D Unspecified fracture of upper end of left humerus, subsequent encounter for fracture with routine healing: Secondary | ICD-10-CM | POA: Diagnosis not present

## 2022-08-05 DIAGNOSIS — R4701 Aphasia: Secondary | ICD-10-CM | POA: Diagnosis not present

## 2022-08-05 DIAGNOSIS — K219 Gastro-esophageal reflux disease without esophagitis: Secondary | ICD-10-CM | POA: Diagnosis not present

## 2022-08-05 DIAGNOSIS — S42202D Unspecified fracture of upper end of left humerus, subsequent encounter for fracture with routine healing: Secondary | ICD-10-CM | POA: Diagnosis not present

## 2022-08-05 DIAGNOSIS — E785 Hyperlipidemia, unspecified: Secondary | ICD-10-CM | POA: Diagnosis not present

## 2022-08-05 DIAGNOSIS — E119 Type 2 diabetes mellitus without complications: Secondary | ICD-10-CM | POA: Diagnosis not present

## 2022-08-05 DIAGNOSIS — C711 Malignant neoplasm of frontal lobe: Secondary | ICD-10-CM | POA: Diagnosis not present

## 2022-08-07 DIAGNOSIS — C711 Malignant neoplasm of frontal lobe: Secondary | ICD-10-CM | POA: Diagnosis not present

## 2022-08-07 DIAGNOSIS — E119 Type 2 diabetes mellitus without complications: Secondary | ICD-10-CM | POA: Diagnosis not present

## 2022-08-07 DIAGNOSIS — E785 Hyperlipidemia, unspecified: Secondary | ICD-10-CM | POA: Diagnosis not present

## 2022-08-07 DIAGNOSIS — K219 Gastro-esophageal reflux disease without esophagitis: Secondary | ICD-10-CM | POA: Diagnosis not present

## 2022-08-07 DIAGNOSIS — S42202D Unspecified fracture of upper end of left humerus, subsequent encounter for fracture with routine healing: Secondary | ICD-10-CM | POA: Diagnosis not present

## 2022-08-07 DIAGNOSIS — R4701 Aphasia: Secondary | ICD-10-CM | POA: Diagnosis not present

## 2022-08-09 DIAGNOSIS — E119 Type 2 diabetes mellitus without complications: Secondary | ICD-10-CM | POA: Diagnosis not present

## 2022-08-09 DIAGNOSIS — E785 Hyperlipidemia, unspecified: Secondary | ICD-10-CM | POA: Diagnosis not present

## 2022-08-09 DIAGNOSIS — R4701 Aphasia: Secondary | ICD-10-CM | POA: Diagnosis not present

## 2022-08-09 DIAGNOSIS — K219 Gastro-esophageal reflux disease without esophagitis: Secondary | ICD-10-CM | POA: Diagnosis not present

## 2022-08-09 DIAGNOSIS — S42202D Unspecified fracture of upper end of left humerus, subsequent encounter for fracture with routine healing: Secondary | ICD-10-CM | POA: Diagnosis not present

## 2022-08-09 DIAGNOSIS — C711 Malignant neoplasm of frontal lobe: Secondary | ICD-10-CM | POA: Diagnosis not present

## 2022-08-10 DIAGNOSIS — C711 Malignant neoplasm of frontal lobe: Secondary | ICD-10-CM | POA: Diagnosis not present

## 2022-08-10 DIAGNOSIS — K219 Gastro-esophageal reflux disease without esophagitis: Secondary | ICD-10-CM | POA: Diagnosis not present

## 2022-08-10 DIAGNOSIS — E785 Hyperlipidemia, unspecified: Secondary | ICD-10-CM | POA: Diagnosis not present

## 2022-08-10 DIAGNOSIS — S42202D Unspecified fracture of upper end of left humerus, subsequent encounter for fracture with routine healing: Secondary | ICD-10-CM | POA: Diagnosis not present

## 2022-08-10 DIAGNOSIS — R4701 Aphasia: Secondary | ICD-10-CM | POA: Diagnosis not present

## 2022-08-10 DIAGNOSIS — E119 Type 2 diabetes mellitus without complications: Secondary | ICD-10-CM | POA: Diagnosis not present

## 2022-08-11 DIAGNOSIS — C711 Malignant neoplasm of frontal lobe: Secondary | ICD-10-CM | POA: Diagnosis not present

## 2022-08-11 DIAGNOSIS — E785 Hyperlipidemia, unspecified: Secondary | ICD-10-CM | POA: Diagnosis not present

## 2022-08-11 DIAGNOSIS — E119 Type 2 diabetes mellitus without complications: Secondary | ICD-10-CM | POA: Diagnosis not present

## 2022-08-11 DIAGNOSIS — R4701 Aphasia: Secondary | ICD-10-CM | POA: Diagnosis not present

## 2022-08-11 DIAGNOSIS — S42202D Unspecified fracture of upper end of left humerus, subsequent encounter for fracture with routine healing: Secondary | ICD-10-CM | POA: Diagnosis not present

## 2022-08-11 DIAGNOSIS — K219 Gastro-esophageal reflux disease without esophagitis: Secondary | ICD-10-CM | POA: Diagnosis not present

## 2022-08-12 DIAGNOSIS — S42202D Unspecified fracture of upper end of left humerus, subsequent encounter for fracture with routine healing: Secondary | ICD-10-CM | POA: Diagnosis not present

## 2022-08-12 DIAGNOSIS — E119 Type 2 diabetes mellitus without complications: Secondary | ICD-10-CM | POA: Diagnosis not present

## 2022-08-12 DIAGNOSIS — K219 Gastro-esophageal reflux disease without esophagitis: Secondary | ICD-10-CM | POA: Diagnosis not present

## 2022-08-12 DIAGNOSIS — R4701 Aphasia: Secondary | ICD-10-CM | POA: Diagnosis not present

## 2022-08-12 DIAGNOSIS — C711 Malignant neoplasm of frontal lobe: Secondary | ICD-10-CM | POA: Diagnosis not present

## 2022-08-12 DIAGNOSIS — E785 Hyperlipidemia, unspecified: Secondary | ICD-10-CM | POA: Diagnosis not present

## 2022-08-13 DIAGNOSIS — R4701 Aphasia: Secondary | ICD-10-CM | POA: Diagnosis not present

## 2022-08-13 DIAGNOSIS — K219 Gastro-esophageal reflux disease without esophagitis: Secondary | ICD-10-CM | POA: Diagnosis not present

## 2022-08-13 DIAGNOSIS — C711 Malignant neoplasm of frontal lobe: Secondary | ICD-10-CM | POA: Diagnosis not present

## 2022-08-13 DIAGNOSIS — E119 Type 2 diabetes mellitus without complications: Secondary | ICD-10-CM | POA: Diagnosis not present

## 2022-08-13 DIAGNOSIS — E785 Hyperlipidemia, unspecified: Secondary | ICD-10-CM | POA: Diagnosis not present

## 2022-08-13 DIAGNOSIS — S42202D Unspecified fracture of upper end of left humerus, subsequent encounter for fracture with routine healing: Secondary | ICD-10-CM | POA: Diagnosis not present

## 2022-08-14 DIAGNOSIS — W19XXXA Unspecified fall, initial encounter: Secondary | ICD-10-CM | POA: Diagnosis not present

## 2022-08-14 DIAGNOSIS — K219 Gastro-esophageal reflux disease without esophagitis: Secondary | ICD-10-CM | POA: Diagnosis not present

## 2022-08-14 DIAGNOSIS — S42202D Unspecified fracture of upper end of left humerus, subsequent encounter for fracture with routine healing: Secondary | ICD-10-CM | POA: Diagnosis not present

## 2022-08-14 DIAGNOSIS — E119 Type 2 diabetes mellitus without complications: Secondary | ICD-10-CM | POA: Diagnosis not present

## 2022-08-14 DIAGNOSIS — C711 Malignant neoplasm of frontal lobe: Secondary | ICD-10-CM | POA: Diagnosis not present

## 2022-08-14 DIAGNOSIS — R404 Transient alteration of awareness: Secondary | ICD-10-CM | POA: Diagnosis not present

## 2022-08-14 DIAGNOSIS — E785 Hyperlipidemia, unspecified: Secondary | ICD-10-CM | POA: Diagnosis not present

## 2022-08-14 DIAGNOSIS — R4701 Aphasia: Secondary | ICD-10-CM | POA: Diagnosis not present

## 2022-08-15 DIAGNOSIS — E785 Hyperlipidemia, unspecified: Secondary | ICD-10-CM | POA: Diagnosis not present

## 2022-08-15 DIAGNOSIS — E119 Type 2 diabetes mellitus without complications: Secondary | ICD-10-CM | POA: Diagnosis not present

## 2022-08-15 DIAGNOSIS — K219 Gastro-esophageal reflux disease without esophagitis: Secondary | ICD-10-CM | POA: Diagnosis not present

## 2022-08-15 DIAGNOSIS — S42202D Unspecified fracture of upper end of left humerus, subsequent encounter for fracture with routine healing: Secondary | ICD-10-CM | POA: Diagnosis not present

## 2022-08-15 DIAGNOSIS — R4701 Aphasia: Secondary | ICD-10-CM | POA: Diagnosis not present

## 2022-08-15 DIAGNOSIS — C711 Malignant neoplasm of frontal lobe: Secondary | ICD-10-CM | POA: Diagnosis not present

## 2022-08-17 DIAGNOSIS — K219 Gastro-esophageal reflux disease without esophagitis: Secondary | ICD-10-CM | POA: Diagnosis not present

## 2022-08-17 DIAGNOSIS — E119 Type 2 diabetes mellitus without complications: Secondary | ICD-10-CM | POA: Diagnosis not present

## 2022-08-17 DIAGNOSIS — R4701 Aphasia: Secondary | ICD-10-CM | POA: Diagnosis not present

## 2022-08-17 DIAGNOSIS — S42202D Unspecified fracture of upper end of left humerus, subsequent encounter for fracture with routine healing: Secondary | ICD-10-CM | POA: Diagnosis not present

## 2022-08-17 DIAGNOSIS — E785 Hyperlipidemia, unspecified: Secondary | ICD-10-CM | POA: Diagnosis not present

## 2022-08-17 DIAGNOSIS — C711 Malignant neoplasm of frontal lobe: Secondary | ICD-10-CM | POA: Diagnosis not present

## 2022-08-19 DIAGNOSIS — E785 Hyperlipidemia, unspecified: Secondary | ICD-10-CM | POA: Diagnosis not present

## 2022-08-19 DIAGNOSIS — S42202D Unspecified fracture of upper end of left humerus, subsequent encounter for fracture with routine healing: Secondary | ICD-10-CM | POA: Diagnosis not present

## 2022-08-19 DIAGNOSIS — E119 Type 2 diabetes mellitus without complications: Secondary | ICD-10-CM | POA: Diagnosis not present

## 2022-08-19 DIAGNOSIS — C711 Malignant neoplasm of frontal lobe: Secondary | ICD-10-CM | POA: Diagnosis not present

## 2022-08-19 DIAGNOSIS — R4701 Aphasia: Secondary | ICD-10-CM | POA: Diagnosis not present

## 2022-08-19 DIAGNOSIS — K219 Gastro-esophageal reflux disease without esophagitis: Secondary | ICD-10-CM | POA: Diagnosis not present

## 2022-08-20 DIAGNOSIS — E119 Type 2 diabetes mellitus without complications: Secondary | ICD-10-CM | POA: Diagnosis not present

## 2022-08-20 DIAGNOSIS — E785 Hyperlipidemia, unspecified: Secondary | ICD-10-CM | POA: Diagnosis not present

## 2022-08-20 DIAGNOSIS — K219 Gastro-esophageal reflux disease without esophagitis: Secondary | ICD-10-CM | POA: Diagnosis not present

## 2022-08-20 DIAGNOSIS — S42202D Unspecified fracture of upper end of left humerus, subsequent encounter for fracture with routine healing: Secondary | ICD-10-CM | POA: Diagnosis not present

## 2022-08-20 DIAGNOSIS — C711 Malignant neoplasm of frontal lobe: Secondary | ICD-10-CM | POA: Diagnosis not present

## 2022-08-20 DIAGNOSIS — R4701 Aphasia: Secondary | ICD-10-CM | POA: Diagnosis not present

## 2022-08-21 DIAGNOSIS — E785 Hyperlipidemia, unspecified: Secondary | ICD-10-CM | POA: Diagnosis not present

## 2022-08-21 DIAGNOSIS — S42202D Unspecified fracture of upper end of left humerus, subsequent encounter for fracture with routine healing: Secondary | ICD-10-CM | POA: Diagnosis not present

## 2022-08-21 DIAGNOSIS — K219 Gastro-esophageal reflux disease without esophagitis: Secondary | ICD-10-CM | POA: Diagnosis not present

## 2022-08-21 DIAGNOSIS — E119 Type 2 diabetes mellitus without complications: Secondary | ICD-10-CM | POA: Diagnosis not present

## 2022-08-21 DIAGNOSIS — C711 Malignant neoplasm of frontal lobe: Secondary | ICD-10-CM | POA: Diagnosis not present

## 2022-08-21 DIAGNOSIS — R4701 Aphasia: Secondary | ICD-10-CM | POA: Diagnosis not present

## 2022-08-22 DIAGNOSIS — R159 Full incontinence of feces: Secondary | ICD-10-CM | POA: Diagnosis not present

## 2022-08-22 DIAGNOSIS — K219 Gastro-esophageal reflux disease without esophagitis: Secondary | ICD-10-CM | POA: Diagnosis not present

## 2022-08-22 DIAGNOSIS — E785 Hyperlipidemia, unspecified: Secondary | ICD-10-CM | POA: Diagnosis not present

## 2022-08-22 DIAGNOSIS — S42202D Unspecified fracture of upper end of left humerus, subsequent encounter for fracture with routine healing: Secondary | ICD-10-CM | POA: Diagnosis not present

## 2022-08-22 DIAGNOSIS — I10 Essential (primary) hypertension: Secondary | ICD-10-CM | POA: Diagnosis not present

## 2022-08-22 DIAGNOSIS — R296 Repeated falls: Secondary | ICD-10-CM | POA: Diagnosis not present

## 2022-08-22 DIAGNOSIS — E039 Hypothyroidism, unspecified: Secondary | ICD-10-CM | POA: Diagnosis not present

## 2022-08-22 DIAGNOSIS — E119 Type 2 diabetes mellitus without complications: Secondary | ICD-10-CM | POA: Diagnosis not present

## 2022-08-22 DIAGNOSIS — G8194 Hemiplegia, unspecified affecting left nondominant side: Secondary | ICD-10-CM | POA: Diagnosis not present

## 2022-08-22 DIAGNOSIS — Z741 Need for assistance with personal care: Secondary | ICD-10-CM | POA: Diagnosis not present

## 2022-08-22 DIAGNOSIS — S01112D Laceration without foreign body of left eyelid and periocular area, subsequent encounter: Secondary | ICD-10-CM | POA: Diagnosis not present

## 2022-08-22 DIAGNOSIS — G40909 Epilepsy, unspecified, not intractable, without status epilepticus: Secondary | ICD-10-CM | POA: Diagnosis not present

## 2022-08-22 DIAGNOSIS — J302 Other seasonal allergic rhinitis: Secondary | ICD-10-CM | POA: Diagnosis not present

## 2022-08-22 DIAGNOSIS — C711 Malignant neoplasm of frontal lobe: Secondary | ICD-10-CM | POA: Diagnosis not present

## 2022-08-22 DIAGNOSIS — R32 Unspecified urinary incontinence: Secondary | ICD-10-CM | POA: Diagnosis not present

## 2022-08-22 DIAGNOSIS — R4701 Aphasia: Secondary | ICD-10-CM | POA: Diagnosis not present

## 2022-08-22 DIAGNOSIS — Z7401 Bed confinement status: Secondary | ICD-10-CM | POA: Diagnosis not present

## 2022-08-24 DIAGNOSIS — C711 Malignant neoplasm of frontal lobe: Secondary | ICD-10-CM | POA: Diagnosis not present

## 2022-08-24 DIAGNOSIS — S42202D Unspecified fracture of upper end of left humerus, subsequent encounter for fracture with routine healing: Secondary | ICD-10-CM | POA: Diagnosis not present

## 2022-08-24 DIAGNOSIS — R4701 Aphasia: Secondary | ICD-10-CM | POA: Diagnosis not present

## 2022-08-24 DIAGNOSIS — K219 Gastro-esophageal reflux disease without esophagitis: Secondary | ICD-10-CM | POA: Diagnosis not present

## 2022-08-24 DIAGNOSIS — E785 Hyperlipidemia, unspecified: Secondary | ICD-10-CM | POA: Diagnosis not present

## 2022-08-24 DIAGNOSIS — E119 Type 2 diabetes mellitus without complications: Secondary | ICD-10-CM | POA: Diagnosis not present

## 2022-08-26 DIAGNOSIS — E785 Hyperlipidemia, unspecified: Secondary | ICD-10-CM | POA: Diagnosis not present

## 2022-08-26 DIAGNOSIS — S42202D Unspecified fracture of upper end of left humerus, subsequent encounter for fracture with routine healing: Secondary | ICD-10-CM | POA: Diagnosis not present

## 2022-08-26 DIAGNOSIS — K219 Gastro-esophageal reflux disease without esophagitis: Secondary | ICD-10-CM | POA: Diagnosis not present

## 2022-08-26 DIAGNOSIS — C711 Malignant neoplasm of frontal lobe: Secondary | ICD-10-CM | POA: Diagnosis not present

## 2022-08-26 DIAGNOSIS — E119 Type 2 diabetes mellitus without complications: Secondary | ICD-10-CM | POA: Diagnosis not present

## 2022-08-26 DIAGNOSIS — R4701 Aphasia: Secondary | ICD-10-CM | POA: Diagnosis not present

## 2022-08-31 DIAGNOSIS — E119 Type 2 diabetes mellitus without complications: Secondary | ICD-10-CM | POA: Diagnosis not present

## 2022-08-31 DIAGNOSIS — E785 Hyperlipidemia, unspecified: Secondary | ICD-10-CM | POA: Diagnosis not present

## 2022-08-31 DIAGNOSIS — C711 Malignant neoplasm of frontal lobe: Secondary | ICD-10-CM | POA: Diagnosis not present

## 2022-08-31 DIAGNOSIS — S42202D Unspecified fracture of upper end of left humerus, subsequent encounter for fracture with routine healing: Secondary | ICD-10-CM | POA: Diagnosis not present

## 2022-08-31 DIAGNOSIS — R4701 Aphasia: Secondary | ICD-10-CM | POA: Diagnosis not present

## 2022-08-31 DIAGNOSIS — K219 Gastro-esophageal reflux disease without esophagitis: Secondary | ICD-10-CM | POA: Diagnosis not present

## 2022-09-02 DIAGNOSIS — C711 Malignant neoplasm of frontal lobe: Secondary | ICD-10-CM | POA: Diagnosis not present

## 2022-09-02 DIAGNOSIS — E785 Hyperlipidemia, unspecified: Secondary | ICD-10-CM | POA: Diagnosis not present

## 2022-09-02 DIAGNOSIS — S42202D Unspecified fracture of upper end of left humerus, subsequent encounter for fracture with routine healing: Secondary | ICD-10-CM | POA: Diagnosis not present

## 2022-09-02 DIAGNOSIS — K219 Gastro-esophageal reflux disease without esophagitis: Secondary | ICD-10-CM | POA: Diagnosis not present

## 2022-09-02 DIAGNOSIS — E119 Type 2 diabetes mellitus without complications: Secondary | ICD-10-CM | POA: Diagnosis not present

## 2022-09-02 DIAGNOSIS — R4701 Aphasia: Secondary | ICD-10-CM | POA: Diagnosis not present

## 2022-09-07 DIAGNOSIS — C711 Malignant neoplasm of frontal lobe: Secondary | ICD-10-CM | POA: Diagnosis not present

## 2022-09-07 DIAGNOSIS — K219 Gastro-esophageal reflux disease without esophagitis: Secondary | ICD-10-CM | POA: Diagnosis not present

## 2022-09-07 DIAGNOSIS — R4701 Aphasia: Secondary | ICD-10-CM | POA: Diagnosis not present

## 2022-09-07 DIAGNOSIS — E785 Hyperlipidemia, unspecified: Secondary | ICD-10-CM | POA: Diagnosis not present

## 2022-09-07 DIAGNOSIS — E119 Type 2 diabetes mellitus without complications: Secondary | ICD-10-CM | POA: Diagnosis not present

## 2022-09-07 DIAGNOSIS — S42202D Unspecified fracture of upper end of left humerus, subsequent encounter for fracture with routine healing: Secondary | ICD-10-CM | POA: Diagnosis not present

## 2022-09-09 DIAGNOSIS — C711 Malignant neoplasm of frontal lobe: Secondary | ICD-10-CM | POA: Diagnosis not present

## 2022-09-09 DIAGNOSIS — E119 Type 2 diabetes mellitus without complications: Secondary | ICD-10-CM | POA: Diagnosis not present

## 2022-09-09 DIAGNOSIS — K219 Gastro-esophageal reflux disease without esophagitis: Secondary | ICD-10-CM | POA: Diagnosis not present

## 2022-09-09 DIAGNOSIS — S42202D Unspecified fracture of upper end of left humerus, subsequent encounter for fracture with routine healing: Secondary | ICD-10-CM | POA: Diagnosis not present

## 2022-09-09 DIAGNOSIS — E785 Hyperlipidemia, unspecified: Secondary | ICD-10-CM | POA: Diagnosis not present

## 2022-09-09 DIAGNOSIS — R4701 Aphasia: Secondary | ICD-10-CM | POA: Diagnosis not present

## 2022-09-14 DIAGNOSIS — E119 Type 2 diabetes mellitus without complications: Secondary | ICD-10-CM | POA: Diagnosis not present

## 2022-09-14 DIAGNOSIS — E785 Hyperlipidemia, unspecified: Secondary | ICD-10-CM | POA: Diagnosis not present

## 2022-09-14 DIAGNOSIS — R4701 Aphasia: Secondary | ICD-10-CM | POA: Diagnosis not present

## 2022-09-14 DIAGNOSIS — S42202D Unspecified fracture of upper end of left humerus, subsequent encounter for fracture with routine healing: Secondary | ICD-10-CM | POA: Diagnosis not present

## 2022-09-14 DIAGNOSIS — C711 Malignant neoplasm of frontal lobe: Secondary | ICD-10-CM | POA: Diagnosis not present

## 2022-09-14 DIAGNOSIS — K219 Gastro-esophageal reflux disease without esophagitis: Secondary | ICD-10-CM | POA: Diagnosis not present

## 2022-09-16 DIAGNOSIS — E785 Hyperlipidemia, unspecified: Secondary | ICD-10-CM | POA: Diagnosis not present

## 2022-09-16 DIAGNOSIS — C711 Malignant neoplasm of frontal lobe: Secondary | ICD-10-CM | POA: Diagnosis not present

## 2022-09-16 DIAGNOSIS — E119 Type 2 diabetes mellitus without complications: Secondary | ICD-10-CM | POA: Diagnosis not present

## 2022-09-16 DIAGNOSIS — K219 Gastro-esophageal reflux disease without esophagitis: Secondary | ICD-10-CM | POA: Diagnosis not present

## 2022-09-16 DIAGNOSIS — S42202D Unspecified fracture of upper end of left humerus, subsequent encounter for fracture with routine healing: Secondary | ICD-10-CM | POA: Diagnosis not present

## 2022-09-16 DIAGNOSIS — R4701 Aphasia: Secondary | ICD-10-CM | POA: Diagnosis not present

## 2022-09-18 DIAGNOSIS — S42202D Unspecified fracture of upper end of left humerus, subsequent encounter for fracture with routine healing: Secondary | ICD-10-CM | POA: Diagnosis not present

## 2022-09-18 DIAGNOSIS — C711 Malignant neoplasm of frontal lobe: Secondary | ICD-10-CM | POA: Diagnosis not present

## 2022-09-18 DIAGNOSIS — K219 Gastro-esophageal reflux disease without esophagitis: Secondary | ICD-10-CM | POA: Diagnosis not present

## 2022-09-18 DIAGNOSIS — E785 Hyperlipidemia, unspecified: Secondary | ICD-10-CM | POA: Diagnosis not present

## 2022-09-18 DIAGNOSIS — E119 Type 2 diabetes mellitus without complications: Secondary | ICD-10-CM | POA: Diagnosis not present

## 2022-09-18 DIAGNOSIS — R4701 Aphasia: Secondary | ICD-10-CM | POA: Diagnosis not present

## 2022-09-21 DIAGNOSIS — Z741 Need for assistance with personal care: Secondary | ICD-10-CM | POA: Diagnosis not present

## 2022-09-21 DIAGNOSIS — S42202D Unspecified fracture of upper end of left humerus, subsequent encounter for fracture with routine healing: Secondary | ICD-10-CM | POA: Diagnosis not present

## 2022-09-21 DIAGNOSIS — R4701 Aphasia: Secondary | ICD-10-CM | POA: Diagnosis not present

## 2022-09-21 DIAGNOSIS — G40909 Epilepsy, unspecified, not intractable, without status epilepticus: Secondary | ICD-10-CM | POA: Diagnosis not present

## 2022-09-21 DIAGNOSIS — R296 Repeated falls: Secondary | ICD-10-CM | POA: Diagnosis not present

## 2022-09-21 DIAGNOSIS — K219 Gastro-esophageal reflux disease without esophagitis: Secondary | ICD-10-CM | POA: Diagnosis not present

## 2022-09-21 DIAGNOSIS — C711 Malignant neoplasm of frontal lobe: Secondary | ICD-10-CM | POA: Diagnosis not present

## 2022-09-21 DIAGNOSIS — R159 Full incontinence of feces: Secondary | ICD-10-CM | POA: Diagnosis not present

## 2022-09-21 DIAGNOSIS — J302 Other seasonal allergic rhinitis: Secondary | ICD-10-CM | POA: Diagnosis not present

## 2022-09-21 DIAGNOSIS — E785 Hyperlipidemia, unspecified: Secondary | ICD-10-CM | POA: Diagnosis not present

## 2022-09-21 DIAGNOSIS — E119 Type 2 diabetes mellitus without complications: Secondary | ICD-10-CM | POA: Diagnosis not present

## 2022-09-21 DIAGNOSIS — G8194 Hemiplegia, unspecified affecting left nondominant side: Secondary | ICD-10-CM | POA: Diagnosis not present

## 2022-09-21 DIAGNOSIS — R32 Unspecified urinary incontinence: Secondary | ICD-10-CM | POA: Diagnosis not present

## 2022-09-21 DIAGNOSIS — S01112D Laceration without foreign body of left eyelid and periocular area, subsequent encounter: Secondary | ICD-10-CM | POA: Diagnosis not present

## 2022-09-21 DIAGNOSIS — Z7401 Bed confinement status: Secondary | ICD-10-CM | POA: Diagnosis not present

## 2022-09-21 DIAGNOSIS — I10 Essential (primary) hypertension: Secondary | ICD-10-CM | POA: Diagnosis not present

## 2022-09-21 DIAGNOSIS — E039 Hypothyroidism, unspecified: Secondary | ICD-10-CM | POA: Diagnosis not present

## 2022-09-23 DIAGNOSIS — K219 Gastro-esophageal reflux disease without esophagitis: Secondary | ICD-10-CM | POA: Diagnosis not present

## 2022-09-23 DIAGNOSIS — R4701 Aphasia: Secondary | ICD-10-CM | POA: Diagnosis not present

## 2022-09-23 DIAGNOSIS — E119 Type 2 diabetes mellitus without complications: Secondary | ICD-10-CM | POA: Diagnosis not present

## 2022-09-23 DIAGNOSIS — E785 Hyperlipidemia, unspecified: Secondary | ICD-10-CM | POA: Diagnosis not present

## 2022-09-23 DIAGNOSIS — C711 Malignant neoplasm of frontal lobe: Secondary | ICD-10-CM | POA: Diagnosis not present

## 2022-09-23 DIAGNOSIS — S42202D Unspecified fracture of upper end of left humerus, subsequent encounter for fracture with routine healing: Secondary | ICD-10-CM | POA: Diagnosis not present

## 2022-09-28 DIAGNOSIS — E119 Type 2 diabetes mellitus without complications: Secondary | ICD-10-CM | POA: Diagnosis not present

## 2022-09-28 DIAGNOSIS — S42202D Unspecified fracture of upper end of left humerus, subsequent encounter for fracture with routine healing: Secondary | ICD-10-CM | POA: Diagnosis not present

## 2022-09-28 DIAGNOSIS — C711 Malignant neoplasm of frontal lobe: Secondary | ICD-10-CM | POA: Diagnosis not present

## 2022-09-28 DIAGNOSIS — K219 Gastro-esophageal reflux disease without esophagitis: Secondary | ICD-10-CM | POA: Diagnosis not present

## 2022-09-28 DIAGNOSIS — R4701 Aphasia: Secondary | ICD-10-CM | POA: Diagnosis not present

## 2022-09-28 DIAGNOSIS — E785 Hyperlipidemia, unspecified: Secondary | ICD-10-CM | POA: Diagnosis not present

## 2022-09-30 DIAGNOSIS — E119 Type 2 diabetes mellitus without complications: Secondary | ICD-10-CM | POA: Diagnosis not present

## 2022-09-30 DIAGNOSIS — S42202D Unspecified fracture of upper end of left humerus, subsequent encounter for fracture with routine healing: Secondary | ICD-10-CM | POA: Diagnosis not present

## 2022-09-30 DIAGNOSIS — E785 Hyperlipidemia, unspecified: Secondary | ICD-10-CM | POA: Diagnosis not present

## 2022-09-30 DIAGNOSIS — R4701 Aphasia: Secondary | ICD-10-CM | POA: Diagnosis not present

## 2022-09-30 DIAGNOSIS — K219 Gastro-esophageal reflux disease without esophagitis: Secondary | ICD-10-CM | POA: Diagnosis not present

## 2022-09-30 DIAGNOSIS — C711 Malignant neoplasm of frontal lobe: Secondary | ICD-10-CM | POA: Diagnosis not present

## 2022-10-02 DIAGNOSIS — R4701 Aphasia: Secondary | ICD-10-CM | POA: Diagnosis not present

## 2022-10-02 DIAGNOSIS — E785 Hyperlipidemia, unspecified: Secondary | ICD-10-CM | POA: Diagnosis not present

## 2022-10-02 DIAGNOSIS — E119 Type 2 diabetes mellitus without complications: Secondary | ICD-10-CM | POA: Diagnosis not present

## 2022-10-02 DIAGNOSIS — S42202D Unspecified fracture of upper end of left humerus, subsequent encounter for fracture with routine healing: Secondary | ICD-10-CM | POA: Diagnosis not present

## 2022-10-02 DIAGNOSIS — C711 Malignant neoplasm of frontal lobe: Secondary | ICD-10-CM | POA: Diagnosis not present

## 2022-10-02 DIAGNOSIS — K219 Gastro-esophageal reflux disease without esophagitis: Secondary | ICD-10-CM | POA: Diagnosis not present

## 2022-10-04 DIAGNOSIS — R4701 Aphasia: Secondary | ICD-10-CM | POA: Diagnosis not present

## 2022-10-04 DIAGNOSIS — C711 Malignant neoplasm of frontal lobe: Secondary | ICD-10-CM | POA: Diagnosis not present

## 2022-10-04 DIAGNOSIS — E119 Type 2 diabetes mellitus without complications: Secondary | ICD-10-CM | POA: Diagnosis not present

## 2022-10-04 DIAGNOSIS — S42202D Unspecified fracture of upper end of left humerus, subsequent encounter for fracture with routine healing: Secondary | ICD-10-CM | POA: Diagnosis not present

## 2022-10-04 DIAGNOSIS — E785 Hyperlipidemia, unspecified: Secondary | ICD-10-CM | POA: Diagnosis not present

## 2022-10-04 DIAGNOSIS — K219 Gastro-esophageal reflux disease without esophagitis: Secondary | ICD-10-CM | POA: Diagnosis not present

## 2022-10-05 DIAGNOSIS — C711 Malignant neoplasm of frontal lobe: Secondary | ICD-10-CM | POA: Diagnosis not present

## 2022-10-05 DIAGNOSIS — E785 Hyperlipidemia, unspecified: Secondary | ICD-10-CM | POA: Diagnosis not present

## 2022-10-05 DIAGNOSIS — E119 Type 2 diabetes mellitus without complications: Secondary | ICD-10-CM | POA: Diagnosis not present

## 2022-10-05 DIAGNOSIS — K219 Gastro-esophageal reflux disease without esophagitis: Secondary | ICD-10-CM | POA: Diagnosis not present

## 2022-10-05 DIAGNOSIS — S42202D Unspecified fracture of upper end of left humerus, subsequent encounter for fracture with routine healing: Secondary | ICD-10-CM | POA: Diagnosis not present

## 2022-10-05 DIAGNOSIS — R4701 Aphasia: Secondary | ICD-10-CM | POA: Diagnosis not present

## 2022-10-06 DIAGNOSIS — E785 Hyperlipidemia, unspecified: Secondary | ICD-10-CM | POA: Diagnosis not present

## 2022-10-06 DIAGNOSIS — C711 Malignant neoplasm of frontal lobe: Secondary | ICD-10-CM | POA: Diagnosis not present

## 2022-10-06 DIAGNOSIS — S42202D Unspecified fracture of upper end of left humerus, subsequent encounter for fracture with routine healing: Secondary | ICD-10-CM | POA: Diagnosis not present

## 2022-10-06 DIAGNOSIS — R4701 Aphasia: Secondary | ICD-10-CM | POA: Diagnosis not present

## 2022-10-06 DIAGNOSIS — K219 Gastro-esophageal reflux disease without esophagitis: Secondary | ICD-10-CM | POA: Diagnosis not present

## 2022-10-06 DIAGNOSIS — E119 Type 2 diabetes mellitus without complications: Secondary | ICD-10-CM | POA: Diagnosis not present

## 2022-10-07 DIAGNOSIS — E785 Hyperlipidemia, unspecified: Secondary | ICD-10-CM | POA: Diagnosis not present

## 2022-10-07 DIAGNOSIS — C711 Malignant neoplasm of frontal lobe: Secondary | ICD-10-CM | POA: Diagnosis not present

## 2022-10-07 DIAGNOSIS — K219 Gastro-esophageal reflux disease without esophagitis: Secondary | ICD-10-CM | POA: Diagnosis not present

## 2022-10-07 DIAGNOSIS — E119 Type 2 diabetes mellitus without complications: Secondary | ICD-10-CM | POA: Diagnosis not present

## 2022-10-07 DIAGNOSIS — R4701 Aphasia: Secondary | ICD-10-CM | POA: Diagnosis not present

## 2022-10-07 DIAGNOSIS — S42202D Unspecified fracture of upper end of left humerus, subsequent encounter for fracture with routine healing: Secondary | ICD-10-CM | POA: Diagnosis not present

## 2022-10-08 DIAGNOSIS — C711 Malignant neoplasm of frontal lobe: Secondary | ICD-10-CM | POA: Diagnosis not present

## 2022-10-08 DIAGNOSIS — K219 Gastro-esophageal reflux disease without esophagitis: Secondary | ICD-10-CM | POA: Diagnosis not present

## 2022-10-08 DIAGNOSIS — E119 Type 2 diabetes mellitus without complications: Secondary | ICD-10-CM | POA: Diagnosis not present

## 2022-10-08 DIAGNOSIS — R4701 Aphasia: Secondary | ICD-10-CM | POA: Diagnosis not present

## 2022-10-08 DIAGNOSIS — S42202D Unspecified fracture of upper end of left humerus, subsequent encounter for fracture with routine healing: Secondary | ICD-10-CM | POA: Diagnosis not present

## 2022-10-08 DIAGNOSIS — E785 Hyperlipidemia, unspecified: Secondary | ICD-10-CM | POA: Diagnosis not present

## 2022-10-09 DIAGNOSIS — S42202D Unspecified fracture of upper end of left humerus, subsequent encounter for fracture with routine healing: Secondary | ICD-10-CM | POA: Diagnosis not present

## 2022-10-09 DIAGNOSIS — E119 Type 2 diabetes mellitus without complications: Secondary | ICD-10-CM | POA: Diagnosis not present

## 2022-10-09 DIAGNOSIS — C711 Malignant neoplasm of frontal lobe: Secondary | ICD-10-CM | POA: Diagnosis not present

## 2022-10-09 DIAGNOSIS — K219 Gastro-esophageal reflux disease without esophagitis: Secondary | ICD-10-CM | POA: Diagnosis not present

## 2022-10-09 DIAGNOSIS — R4701 Aphasia: Secondary | ICD-10-CM | POA: Diagnosis not present

## 2022-10-09 DIAGNOSIS — E785 Hyperlipidemia, unspecified: Secondary | ICD-10-CM | POA: Diagnosis not present

## 2022-10-10 DIAGNOSIS — K219 Gastro-esophageal reflux disease without esophagitis: Secondary | ICD-10-CM | POA: Diagnosis not present

## 2022-10-10 DIAGNOSIS — E785 Hyperlipidemia, unspecified: Secondary | ICD-10-CM | POA: Diagnosis not present

## 2022-10-10 DIAGNOSIS — S42202D Unspecified fracture of upper end of left humerus, subsequent encounter for fracture with routine healing: Secondary | ICD-10-CM | POA: Diagnosis not present

## 2022-10-10 DIAGNOSIS — R4701 Aphasia: Secondary | ICD-10-CM | POA: Diagnosis not present

## 2022-10-10 DIAGNOSIS — C711 Malignant neoplasm of frontal lobe: Secondary | ICD-10-CM | POA: Diagnosis not present

## 2022-10-10 DIAGNOSIS — E119 Type 2 diabetes mellitus without complications: Secondary | ICD-10-CM | POA: Diagnosis not present

## 2022-10-14 DIAGNOSIS — C711 Malignant neoplasm of frontal lobe: Secondary | ICD-10-CM | POA: Diagnosis not present

## 2022-10-14 DIAGNOSIS — E785 Hyperlipidemia, unspecified: Secondary | ICD-10-CM | POA: Diagnosis not present

## 2022-10-14 DIAGNOSIS — E119 Type 2 diabetes mellitus without complications: Secondary | ICD-10-CM | POA: Diagnosis not present

## 2022-10-14 DIAGNOSIS — S42202D Unspecified fracture of upper end of left humerus, subsequent encounter for fracture with routine healing: Secondary | ICD-10-CM | POA: Diagnosis not present

## 2022-10-14 DIAGNOSIS — R4701 Aphasia: Secondary | ICD-10-CM | POA: Diagnosis not present

## 2022-10-14 DIAGNOSIS — K219 Gastro-esophageal reflux disease without esophagitis: Secondary | ICD-10-CM | POA: Diagnosis not present

## 2022-10-19 DIAGNOSIS — E119 Type 2 diabetes mellitus without complications: Secondary | ICD-10-CM | POA: Diagnosis not present

## 2022-10-19 DIAGNOSIS — R4701 Aphasia: Secondary | ICD-10-CM | POA: Diagnosis not present

## 2022-10-19 DIAGNOSIS — C711 Malignant neoplasm of frontal lobe: Secondary | ICD-10-CM | POA: Diagnosis not present

## 2022-10-19 DIAGNOSIS — K219 Gastro-esophageal reflux disease without esophagitis: Secondary | ICD-10-CM | POA: Diagnosis not present

## 2022-10-19 DIAGNOSIS — S42202D Unspecified fracture of upper end of left humerus, subsequent encounter for fracture with routine healing: Secondary | ICD-10-CM | POA: Diagnosis not present

## 2022-10-19 DIAGNOSIS — E785 Hyperlipidemia, unspecified: Secondary | ICD-10-CM | POA: Diagnosis not present

## 2022-10-21 DIAGNOSIS — E785 Hyperlipidemia, unspecified: Secondary | ICD-10-CM | POA: Diagnosis not present

## 2022-10-21 DIAGNOSIS — K219 Gastro-esophageal reflux disease without esophagitis: Secondary | ICD-10-CM | POA: Diagnosis not present

## 2022-10-21 DIAGNOSIS — R4701 Aphasia: Secondary | ICD-10-CM | POA: Diagnosis not present

## 2022-10-21 DIAGNOSIS — E119 Type 2 diabetes mellitus without complications: Secondary | ICD-10-CM | POA: Diagnosis not present

## 2022-10-21 DIAGNOSIS — C711 Malignant neoplasm of frontal lobe: Secondary | ICD-10-CM | POA: Diagnosis not present

## 2022-10-21 DIAGNOSIS — S42202D Unspecified fracture of upper end of left humerus, subsequent encounter for fracture with routine healing: Secondary | ICD-10-CM | POA: Diagnosis not present

## 2022-10-22 DIAGNOSIS — K219 Gastro-esophageal reflux disease without esophagitis: Secondary | ICD-10-CM | POA: Diagnosis not present

## 2022-10-22 DIAGNOSIS — G8194 Hemiplegia, unspecified affecting left nondominant side: Secondary | ICD-10-CM | POA: Diagnosis not present

## 2022-10-22 DIAGNOSIS — S01112D Laceration without foreign body of left eyelid and periocular area, subsequent encounter: Secondary | ICD-10-CM | POA: Diagnosis not present

## 2022-10-22 DIAGNOSIS — S42202D Unspecified fracture of upper end of left humerus, subsequent encounter for fracture with routine healing: Secondary | ICD-10-CM | POA: Diagnosis not present

## 2022-10-22 DIAGNOSIS — R32 Unspecified urinary incontinence: Secondary | ICD-10-CM | POA: Diagnosis not present

## 2022-10-22 DIAGNOSIS — E119 Type 2 diabetes mellitus without complications: Secondary | ICD-10-CM | POA: Diagnosis not present

## 2022-10-22 DIAGNOSIS — J302 Other seasonal allergic rhinitis: Secondary | ICD-10-CM | POA: Diagnosis not present

## 2022-10-22 DIAGNOSIS — I10 Essential (primary) hypertension: Secondary | ICD-10-CM | POA: Diagnosis not present

## 2022-10-22 DIAGNOSIS — Z741 Need for assistance with personal care: Secondary | ICD-10-CM | POA: Diagnosis not present

## 2022-10-22 DIAGNOSIS — E039 Hypothyroidism, unspecified: Secondary | ICD-10-CM | POA: Diagnosis not present

## 2022-10-22 DIAGNOSIS — R4701 Aphasia: Secondary | ICD-10-CM | POA: Diagnosis not present

## 2022-10-22 DIAGNOSIS — R159 Full incontinence of feces: Secondary | ICD-10-CM | POA: Diagnosis not present

## 2022-10-22 DIAGNOSIS — C711 Malignant neoplasm of frontal lobe: Secondary | ICD-10-CM | POA: Diagnosis not present

## 2022-10-22 DIAGNOSIS — R296 Repeated falls: Secondary | ICD-10-CM | POA: Diagnosis not present

## 2022-10-22 DIAGNOSIS — G40909 Epilepsy, unspecified, not intractable, without status epilepticus: Secondary | ICD-10-CM | POA: Diagnosis not present

## 2022-10-22 DIAGNOSIS — Z7401 Bed confinement status: Secondary | ICD-10-CM | POA: Diagnosis not present

## 2022-10-22 DIAGNOSIS — E785 Hyperlipidemia, unspecified: Secondary | ICD-10-CM | POA: Diagnosis not present

## 2022-10-26 DIAGNOSIS — E119 Type 2 diabetes mellitus without complications: Secondary | ICD-10-CM | POA: Diagnosis not present

## 2022-10-26 DIAGNOSIS — C711 Malignant neoplasm of frontal lobe: Secondary | ICD-10-CM | POA: Diagnosis not present

## 2022-10-26 DIAGNOSIS — R4701 Aphasia: Secondary | ICD-10-CM | POA: Diagnosis not present

## 2022-10-26 DIAGNOSIS — S42202D Unspecified fracture of upper end of left humerus, subsequent encounter for fracture with routine healing: Secondary | ICD-10-CM | POA: Diagnosis not present

## 2022-10-26 DIAGNOSIS — K219 Gastro-esophageal reflux disease without esophagitis: Secondary | ICD-10-CM | POA: Diagnosis not present

## 2022-10-26 DIAGNOSIS — E785 Hyperlipidemia, unspecified: Secondary | ICD-10-CM | POA: Diagnosis not present

## 2022-10-28 DIAGNOSIS — S42202D Unspecified fracture of upper end of left humerus, subsequent encounter for fracture with routine healing: Secondary | ICD-10-CM | POA: Diagnosis not present

## 2022-10-28 DIAGNOSIS — E119 Type 2 diabetes mellitus without complications: Secondary | ICD-10-CM | POA: Diagnosis not present

## 2022-10-28 DIAGNOSIS — E785 Hyperlipidemia, unspecified: Secondary | ICD-10-CM | POA: Diagnosis not present

## 2022-10-28 DIAGNOSIS — R4701 Aphasia: Secondary | ICD-10-CM | POA: Diagnosis not present

## 2022-10-28 DIAGNOSIS — K219 Gastro-esophageal reflux disease without esophagitis: Secondary | ICD-10-CM | POA: Diagnosis not present

## 2022-10-28 DIAGNOSIS — C711 Malignant neoplasm of frontal lobe: Secondary | ICD-10-CM | POA: Diagnosis not present

## 2022-10-29 DIAGNOSIS — C711 Malignant neoplasm of frontal lobe: Secondary | ICD-10-CM | POA: Diagnosis not present

## 2022-10-29 DIAGNOSIS — E119 Type 2 diabetes mellitus without complications: Secondary | ICD-10-CM | POA: Diagnosis not present

## 2022-10-29 DIAGNOSIS — R4701 Aphasia: Secondary | ICD-10-CM | POA: Diagnosis not present

## 2022-10-29 DIAGNOSIS — E785 Hyperlipidemia, unspecified: Secondary | ICD-10-CM | POA: Diagnosis not present

## 2022-10-29 DIAGNOSIS — S42202D Unspecified fracture of upper end of left humerus, subsequent encounter for fracture with routine healing: Secondary | ICD-10-CM | POA: Diagnosis not present

## 2022-10-29 DIAGNOSIS — K219 Gastro-esophageal reflux disease without esophagitis: Secondary | ICD-10-CM | POA: Diagnosis not present

## 2022-11-02 DIAGNOSIS — E119 Type 2 diabetes mellitus without complications: Secondary | ICD-10-CM | POA: Diagnosis not present

## 2022-11-02 DIAGNOSIS — K219 Gastro-esophageal reflux disease without esophagitis: Secondary | ICD-10-CM | POA: Diagnosis not present

## 2022-11-02 DIAGNOSIS — S42202D Unspecified fracture of upper end of left humerus, subsequent encounter for fracture with routine healing: Secondary | ICD-10-CM | POA: Diagnosis not present

## 2022-11-02 DIAGNOSIS — C711 Malignant neoplasm of frontal lobe: Secondary | ICD-10-CM | POA: Diagnosis not present

## 2022-11-02 DIAGNOSIS — R4701 Aphasia: Secondary | ICD-10-CM | POA: Diagnosis not present

## 2022-11-02 DIAGNOSIS — E785 Hyperlipidemia, unspecified: Secondary | ICD-10-CM | POA: Diagnosis not present

## 2022-11-04 DIAGNOSIS — K219 Gastro-esophageal reflux disease without esophagitis: Secondary | ICD-10-CM | POA: Diagnosis not present

## 2022-11-04 DIAGNOSIS — E119 Type 2 diabetes mellitus without complications: Secondary | ICD-10-CM | POA: Diagnosis not present

## 2022-11-04 DIAGNOSIS — R4701 Aphasia: Secondary | ICD-10-CM | POA: Diagnosis not present

## 2022-11-04 DIAGNOSIS — S42202D Unspecified fracture of upper end of left humerus, subsequent encounter for fracture with routine healing: Secondary | ICD-10-CM | POA: Diagnosis not present

## 2022-11-04 DIAGNOSIS — C711 Malignant neoplasm of frontal lobe: Secondary | ICD-10-CM | POA: Diagnosis not present

## 2022-11-04 DIAGNOSIS — E785 Hyperlipidemia, unspecified: Secondary | ICD-10-CM | POA: Diagnosis not present

## 2022-11-09 DIAGNOSIS — R4701 Aphasia: Secondary | ICD-10-CM | POA: Diagnosis not present

## 2022-11-09 DIAGNOSIS — S42202D Unspecified fracture of upper end of left humerus, subsequent encounter for fracture with routine healing: Secondary | ICD-10-CM | POA: Diagnosis not present

## 2022-11-09 DIAGNOSIS — K219 Gastro-esophageal reflux disease without esophagitis: Secondary | ICD-10-CM | POA: Diagnosis not present

## 2022-11-09 DIAGNOSIS — E785 Hyperlipidemia, unspecified: Secondary | ICD-10-CM | POA: Diagnosis not present

## 2022-11-09 DIAGNOSIS — C711 Malignant neoplasm of frontal lobe: Secondary | ICD-10-CM | POA: Diagnosis not present

## 2022-11-09 DIAGNOSIS — E119 Type 2 diabetes mellitus without complications: Secondary | ICD-10-CM | POA: Diagnosis not present

## 2022-11-11 DIAGNOSIS — K219 Gastro-esophageal reflux disease without esophagitis: Secondary | ICD-10-CM | POA: Diagnosis not present

## 2022-11-11 DIAGNOSIS — S42202D Unspecified fracture of upper end of left humerus, subsequent encounter for fracture with routine healing: Secondary | ICD-10-CM | POA: Diagnosis not present

## 2022-11-11 DIAGNOSIS — E785 Hyperlipidemia, unspecified: Secondary | ICD-10-CM | POA: Diagnosis not present

## 2022-11-11 DIAGNOSIS — C711 Malignant neoplasm of frontal lobe: Secondary | ICD-10-CM | POA: Diagnosis not present

## 2022-11-11 DIAGNOSIS — E119 Type 2 diabetes mellitus without complications: Secondary | ICD-10-CM | POA: Diagnosis not present

## 2022-11-11 DIAGNOSIS — R4701 Aphasia: Secondary | ICD-10-CM | POA: Diagnosis not present

## 2022-11-16 DIAGNOSIS — E119 Type 2 diabetes mellitus without complications: Secondary | ICD-10-CM | POA: Diagnosis not present

## 2022-11-16 DIAGNOSIS — K219 Gastro-esophageal reflux disease without esophagitis: Secondary | ICD-10-CM | POA: Diagnosis not present

## 2022-11-16 DIAGNOSIS — E785 Hyperlipidemia, unspecified: Secondary | ICD-10-CM | POA: Diagnosis not present

## 2022-11-16 DIAGNOSIS — S42202D Unspecified fracture of upper end of left humerus, subsequent encounter for fracture with routine healing: Secondary | ICD-10-CM | POA: Diagnosis not present

## 2022-11-16 DIAGNOSIS — R4701 Aphasia: Secondary | ICD-10-CM | POA: Diagnosis not present

## 2022-11-16 DIAGNOSIS — C711 Malignant neoplasm of frontal lobe: Secondary | ICD-10-CM | POA: Diagnosis not present

## 2022-11-18 DIAGNOSIS — C711 Malignant neoplasm of frontal lobe: Secondary | ICD-10-CM | POA: Diagnosis not present

## 2022-11-18 DIAGNOSIS — R4701 Aphasia: Secondary | ICD-10-CM | POA: Diagnosis not present

## 2022-11-18 DIAGNOSIS — S42202D Unspecified fracture of upper end of left humerus, subsequent encounter for fracture with routine healing: Secondary | ICD-10-CM | POA: Diagnosis not present

## 2022-11-18 DIAGNOSIS — E119 Type 2 diabetes mellitus without complications: Secondary | ICD-10-CM | POA: Diagnosis not present

## 2022-11-18 DIAGNOSIS — E785 Hyperlipidemia, unspecified: Secondary | ICD-10-CM | POA: Diagnosis not present

## 2022-11-18 DIAGNOSIS — K219 Gastro-esophageal reflux disease without esophagitis: Secondary | ICD-10-CM | POA: Diagnosis not present

## 2022-11-22 DIAGNOSIS — K219 Gastro-esophageal reflux disease without esophagitis: Secondary | ICD-10-CM | POA: Diagnosis not present

## 2022-11-22 DIAGNOSIS — R296 Repeated falls: Secondary | ICD-10-CM | POA: Diagnosis not present

## 2022-11-22 DIAGNOSIS — E785 Hyperlipidemia, unspecified: Secondary | ICD-10-CM | POA: Diagnosis not present

## 2022-11-22 DIAGNOSIS — S01112D Laceration without foreign body of left eyelid and periocular area, subsequent encounter: Secondary | ICD-10-CM | POA: Diagnosis not present

## 2022-11-22 DIAGNOSIS — R32 Unspecified urinary incontinence: Secondary | ICD-10-CM | POA: Diagnosis not present

## 2022-11-22 DIAGNOSIS — I10 Essential (primary) hypertension: Secondary | ICD-10-CM | POA: Diagnosis not present

## 2022-11-22 DIAGNOSIS — E119 Type 2 diabetes mellitus without complications: Secondary | ICD-10-CM | POA: Diagnosis not present

## 2022-11-22 DIAGNOSIS — S42202D Unspecified fracture of upper end of left humerus, subsequent encounter for fracture with routine healing: Secondary | ICD-10-CM | POA: Diagnosis not present

## 2022-11-22 DIAGNOSIS — R4701 Aphasia: Secondary | ICD-10-CM | POA: Diagnosis not present

## 2022-11-22 DIAGNOSIS — C711 Malignant neoplasm of frontal lobe: Secondary | ICD-10-CM | POA: Diagnosis not present

## 2022-11-22 DIAGNOSIS — G8194 Hemiplegia, unspecified affecting left nondominant side: Secondary | ICD-10-CM | POA: Diagnosis not present

## 2022-11-22 DIAGNOSIS — G40909 Epilepsy, unspecified, not intractable, without status epilepticus: Secondary | ICD-10-CM | POA: Diagnosis not present

## 2022-11-23 DIAGNOSIS — S42202D Unspecified fracture of upper end of left humerus, subsequent encounter for fracture with routine healing: Secondary | ICD-10-CM | POA: Diagnosis not present

## 2022-11-23 DIAGNOSIS — K219 Gastro-esophageal reflux disease without esophagitis: Secondary | ICD-10-CM | POA: Diagnosis not present

## 2022-11-23 DIAGNOSIS — E119 Type 2 diabetes mellitus without complications: Secondary | ICD-10-CM | POA: Diagnosis not present

## 2022-11-23 DIAGNOSIS — C711 Malignant neoplasm of frontal lobe: Secondary | ICD-10-CM | POA: Diagnosis not present

## 2022-11-23 DIAGNOSIS — R4701 Aphasia: Secondary | ICD-10-CM | POA: Diagnosis not present

## 2022-11-23 DIAGNOSIS — E785 Hyperlipidemia, unspecified: Secondary | ICD-10-CM | POA: Diagnosis not present

## 2022-11-24 DIAGNOSIS — K219 Gastro-esophageal reflux disease without esophagitis: Secondary | ICD-10-CM | POA: Diagnosis not present

## 2022-11-24 DIAGNOSIS — C711 Malignant neoplasm of frontal lobe: Secondary | ICD-10-CM | POA: Diagnosis not present

## 2022-11-24 DIAGNOSIS — S42202D Unspecified fracture of upper end of left humerus, subsequent encounter for fracture with routine healing: Secondary | ICD-10-CM | POA: Diagnosis not present

## 2022-11-24 DIAGNOSIS — E119 Type 2 diabetes mellitus without complications: Secondary | ICD-10-CM | POA: Diagnosis not present

## 2022-11-24 DIAGNOSIS — R4701 Aphasia: Secondary | ICD-10-CM | POA: Diagnosis not present

## 2022-11-24 DIAGNOSIS — E785 Hyperlipidemia, unspecified: Secondary | ICD-10-CM | POA: Diagnosis not present

## 2022-11-25 DIAGNOSIS — E119 Type 2 diabetes mellitus without complications: Secondary | ICD-10-CM | POA: Diagnosis not present

## 2022-11-25 DIAGNOSIS — K219 Gastro-esophageal reflux disease without esophagitis: Secondary | ICD-10-CM | POA: Diagnosis not present

## 2022-11-25 DIAGNOSIS — R4701 Aphasia: Secondary | ICD-10-CM | POA: Diagnosis not present

## 2022-11-25 DIAGNOSIS — S42202D Unspecified fracture of upper end of left humerus, subsequent encounter for fracture with routine healing: Secondary | ICD-10-CM | POA: Diagnosis not present

## 2022-11-25 DIAGNOSIS — E785 Hyperlipidemia, unspecified: Secondary | ICD-10-CM | POA: Diagnosis not present

## 2022-11-25 DIAGNOSIS — C711 Malignant neoplasm of frontal lobe: Secondary | ICD-10-CM | POA: Diagnosis not present

## 2022-11-26 DIAGNOSIS — R4701 Aphasia: Secondary | ICD-10-CM | POA: Diagnosis not present

## 2022-11-26 DIAGNOSIS — E785 Hyperlipidemia, unspecified: Secondary | ICD-10-CM | POA: Diagnosis not present

## 2022-11-26 DIAGNOSIS — K219 Gastro-esophageal reflux disease without esophagitis: Secondary | ICD-10-CM | POA: Diagnosis not present

## 2022-11-26 DIAGNOSIS — E119 Type 2 diabetes mellitus without complications: Secondary | ICD-10-CM | POA: Diagnosis not present

## 2022-11-26 DIAGNOSIS — S42202D Unspecified fracture of upper end of left humerus, subsequent encounter for fracture with routine healing: Secondary | ICD-10-CM | POA: Diagnosis not present

## 2022-11-26 DIAGNOSIS — C711 Malignant neoplasm of frontal lobe: Secondary | ICD-10-CM | POA: Diagnosis not present

## 2022-11-27 DIAGNOSIS — C711 Malignant neoplasm of frontal lobe: Secondary | ICD-10-CM | POA: Diagnosis not present

## 2022-11-27 DIAGNOSIS — K219 Gastro-esophageal reflux disease without esophagitis: Secondary | ICD-10-CM | POA: Diagnosis not present

## 2022-11-27 DIAGNOSIS — E785 Hyperlipidemia, unspecified: Secondary | ICD-10-CM | POA: Diagnosis not present

## 2022-11-27 DIAGNOSIS — S42202D Unspecified fracture of upper end of left humerus, subsequent encounter for fracture with routine healing: Secondary | ICD-10-CM | POA: Diagnosis not present

## 2022-11-27 DIAGNOSIS — E119 Type 2 diabetes mellitus without complications: Secondary | ICD-10-CM | POA: Diagnosis not present

## 2022-11-27 DIAGNOSIS — R4701 Aphasia: Secondary | ICD-10-CM | POA: Diagnosis not present

## 2022-11-28 DIAGNOSIS — K219 Gastro-esophageal reflux disease without esophagitis: Secondary | ICD-10-CM | POA: Diagnosis not present

## 2022-11-28 DIAGNOSIS — R4701 Aphasia: Secondary | ICD-10-CM | POA: Diagnosis not present

## 2022-11-28 DIAGNOSIS — C711 Malignant neoplasm of frontal lobe: Secondary | ICD-10-CM | POA: Diagnosis not present

## 2022-11-28 DIAGNOSIS — E119 Type 2 diabetes mellitus without complications: Secondary | ICD-10-CM | POA: Diagnosis not present

## 2022-11-28 DIAGNOSIS — S42202D Unspecified fracture of upper end of left humerus, subsequent encounter for fracture with routine healing: Secondary | ICD-10-CM | POA: Diagnosis not present

## 2022-11-28 DIAGNOSIS — E785 Hyperlipidemia, unspecified: Secondary | ICD-10-CM | POA: Diagnosis not present

## 2022-11-29 DIAGNOSIS — E785 Hyperlipidemia, unspecified: Secondary | ICD-10-CM | POA: Diagnosis not present

## 2022-11-29 DIAGNOSIS — S42202D Unspecified fracture of upper end of left humerus, subsequent encounter for fracture with routine healing: Secondary | ICD-10-CM | POA: Diagnosis not present

## 2022-11-29 DIAGNOSIS — E119 Type 2 diabetes mellitus without complications: Secondary | ICD-10-CM | POA: Diagnosis not present

## 2022-11-29 DIAGNOSIS — K219 Gastro-esophageal reflux disease without esophagitis: Secondary | ICD-10-CM | POA: Diagnosis not present

## 2022-11-29 DIAGNOSIS — C711 Malignant neoplasm of frontal lobe: Secondary | ICD-10-CM | POA: Diagnosis not present

## 2022-11-29 DIAGNOSIS — R4701 Aphasia: Secondary | ICD-10-CM | POA: Diagnosis not present

## 2022-11-30 DIAGNOSIS — E119 Type 2 diabetes mellitus without complications: Secondary | ICD-10-CM | POA: Diagnosis not present

## 2022-11-30 DIAGNOSIS — E785 Hyperlipidemia, unspecified: Secondary | ICD-10-CM | POA: Diagnosis not present

## 2022-11-30 DIAGNOSIS — S42202D Unspecified fracture of upper end of left humerus, subsequent encounter for fracture with routine healing: Secondary | ICD-10-CM | POA: Diagnosis not present

## 2022-11-30 DIAGNOSIS — C711 Malignant neoplasm of frontal lobe: Secondary | ICD-10-CM | POA: Diagnosis not present

## 2022-11-30 DIAGNOSIS — R4701 Aphasia: Secondary | ICD-10-CM | POA: Diagnosis not present

## 2022-11-30 DIAGNOSIS — K219 Gastro-esophageal reflux disease without esophagitis: Secondary | ICD-10-CM | POA: Diagnosis not present

## 2022-12-01 DIAGNOSIS — K219 Gastro-esophageal reflux disease without esophagitis: Secondary | ICD-10-CM | POA: Diagnosis not present

## 2022-12-01 DIAGNOSIS — S42202D Unspecified fracture of upper end of left humerus, subsequent encounter for fracture with routine healing: Secondary | ICD-10-CM | POA: Diagnosis not present

## 2022-12-01 DIAGNOSIS — E785 Hyperlipidemia, unspecified: Secondary | ICD-10-CM | POA: Diagnosis not present

## 2022-12-01 DIAGNOSIS — R4701 Aphasia: Secondary | ICD-10-CM | POA: Diagnosis not present

## 2022-12-01 DIAGNOSIS — E119 Type 2 diabetes mellitus without complications: Secondary | ICD-10-CM | POA: Diagnosis not present

## 2022-12-01 DIAGNOSIS — C711 Malignant neoplasm of frontal lobe: Secondary | ICD-10-CM | POA: Diagnosis not present

## 2022-12-02 DIAGNOSIS — C711 Malignant neoplasm of frontal lobe: Secondary | ICD-10-CM | POA: Diagnosis not present

## 2022-12-02 DIAGNOSIS — S42202D Unspecified fracture of upper end of left humerus, subsequent encounter for fracture with routine healing: Secondary | ICD-10-CM | POA: Diagnosis not present

## 2022-12-02 DIAGNOSIS — K219 Gastro-esophageal reflux disease without esophagitis: Secondary | ICD-10-CM | POA: Diagnosis not present

## 2022-12-02 DIAGNOSIS — E119 Type 2 diabetes mellitus without complications: Secondary | ICD-10-CM | POA: Diagnosis not present

## 2022-12-02 DIAGNOSIS — R4701 Aphasia: Secondary | ICD-10-CM | POA: Diagnosis not present

## 2022-12-02 DIAGNOSIS — E785 Hyperlipidemia, unspecified: Secondary | ICD-10-CM | POA: Diagnosis not present

## 2022-12-03 DIAGNOSIS — E785 Hyperlipidemia, unspecified: Secondary | ICD-10-CM | POA: Diagnosis not present

## 2022-12-03 DIAGNOSIS — R4701 Aphasia: Secondary | ICD-10-CM | POA: Diagnosis not present

## 2022-12-03 DIAGNOSIS — S42202D Unspecified fracture of upper end of left humerus, subsequent encounter for fracture with routine healing: Secondary | ICD-10-CM | POA: Diagnosis not present

## 2022-12-03 DIAGNOSIS — K219 Gastro-esophageal reflux disease without esophagitis: Secondary | ICD-10-CM | POA: Diagnosis not present

## 2022-12-03 DIAGNOSIS — E119 Type 2 diabetes mellitus without complications: Secondary | ICD-10-CM | POA: Diagnosis not present

## 2022-12-03 DIAGNOSIS — C711 Malignant neoplasm of frontal lobe: Secondary | ICD-10-CM | POA: Diagnosis not present

## 2022-12-04 DIAGNOSIS — C711 Malignant neoplasm of frontal lobe: Secondary | ICD-10-CM | POA: Diagnosis not present

## 2022-12-04 DIAGNOSIS — E119 Type 2 diabetes mellitus without complications: Secondary | ICD-10-CM | POA: Diagnosis not present

## 2022-12-04 DIAGNOSIS — R4701 Aphasia: Secondary | ICD-10-CM | POA: Diagnosis not present

## 2022-12-04 DIAGNOSIS — S42202D Unspecified fracture of upper end of left humerus, subsequent encounter for fracture with routine healing: Secondary | ICD-10-CM | POA: Diagnosis not present

## 2022-12-04 DIAGNOSIS — K219 Gastro-esophageal reflux disease without esophagitis: Secondary | ICD-10-CM | POA: Diagnosis not present

## 2022-12-04 DIAGNOSIS — E785 Hyperlipidemia, unspecified: Secondary | ICD-10-CM | POA: Diagnosis not present

## 2022-12-05 DIAGNOSIS — C711 Malignant neoplasm of frontal lobe: Secondary | ICD-10-CM | POA: Diagnosis not present

## 2022-12-05 DIAGNOSIS — S42202D Unspecified fracture of upper end of left humerus, subsequent encounter for fracture with routine healing: Secondary | ICD-10-CM | POA: Diagnosis not present

## 2022-12-05 DIAGNOSIS — E119 Type 2 diabetes mellitus without complications: Secondary | ICD-10-CM | POA: Diagnosis not present

## 2022-12-05 DIAGNOSIS — E785 Hyperlipidemia, unspecified: Secondary | ICD-10-CM | POA: Diagnosis not present

## 2022-12-05 DIAGNOSIS — K219 Gastro-esophageal reflux disease without esophagitis: Secondary | ICD-10-CM | POA: Diagnosis not present

## 2022-12-05 DIAGNOSIS — R4701 Aphasia: Secondary | ICD-10-CM | POA: Diagnosis not present

## 2022-12-06 DIAGNOSIS — K219 Gastro-esophageal reflux disease without esophagitis: Secondary | ICD-10-CM | POA: Diagnosis not present

## 2022-12-06 DIAGNOSIS — E119 Type 2 diabetes mellitus without complications: Secondary | ICD-10-CM | POA: Diagnosis not present

## 2022-12-06 DIAGNOSIS — S42202D Unspecified fracture of upper end of left humerus, subsequent encounter for fracture with routine healing: Secondary | ICD-10-CM | POA: Diagnosis not present

## 2022-12-06 DIAGNOSIS — C711 Malignant neoplasm of frontal lobe: Secondary | ICD-10-CM | POA: Diagnosis not present

## 2022-12-06 DIAGNOSIS — E785 Hyperlipidemia, unspecified: Secondary | ICD-10-CM | POA: Diagnosis not present

## 2022-12-06 DIAGNOSIS — R4701 Aphasia: Secondary | ICD-10-CM | POA: Diagnosis not present

## 2022-12-07 DIAGNOSIS — C711 Malignant neoplasm of frontal lobe: Secondary | ICD-10-CM | POA: Diagnosis not present

## 2022-12-07 DIAGNOSIS — E785 Hyperlipidemia, unspecified: Secondary | ICD-10-CM | POA: Diagnosis not present

## 2022-12-07 DIAGNOSIS — S42202D Unspecified fracture of upper end of left humerus, subsequent encounter for fracture with routine healing: Secondary | ICD-10-CM | POA: Diagnosis not present

## 2022-12-07 DIAGNOSIS — E119 Type 2 diabetes mellitus without complications: Secondary | ICD-10-CM | POA: Diagnosis not present

## 2022-12-07 DIAGNOSIS — R4701 Aphasia: Secondary | ICD-10-CM | POA: Diagnosis not present

## 2022-12-07 DIAGNOSIS — K219 Gastro-esophageal reflux disease without esophagitis: Secondary | ICD-10-CM | POA: Diagnosis not present

## 2022-12-08 DIAGNOSIS — E785 Hyperlipidemia, unspecified: Secondary | ICD-10-CM | POA: Diagnosis not present

## 2022-12-08 DIAGNOSIS — K219 Gastro-esophageal reflux disease without esophagitis: Secondary | ICD-10-CM | POA: Diagnosis not present

## 2022-12-08 DIAGNOSIS — R4701 Aphasia: Secondary | ICD-10-CM | POA: Diagnosis not present

## 2022-12-08 DIAGNOSIS — S42202D Unspecified fracture of upper end of left humerus, subsequent encounter for fracture with routine healing: Secondary | ICD-10-CM | POA: Diagnosis not present

## 2022-12-08 DIAGNOSIS — C711 Malignant neoplasm of frontal lobe: Secondary | ICD-10-CM | POA: Diagnosis not present

## 2022-12-08 DIAGNOSIS — E119 Type 2 diabetes mellitus without complications: Secondary | ICD-10-CM | POA: Diagnosis not present

## 2022-12-09 DIAGNOSIS — R4701 Aphasia: Secondary | ICD-10-CM | POA: Diagnosis not present

## 2022-12-09 DIAGNOSIS — S42202D Unspecified fracture of upper end of left humerus, subsequent encounter for fracture with routine healing: Secondary | ICD-10-CM | POA: Diagnosis not present

## 2022-12-09 DIAGNOSIS — E119 Type 2 diabetes mellitus without complications: Secondary | ICD-10-CM | POA: Diagnosis not present

## 2022-12-09 DIAGNOSIS — E785 Hyperlipidemia, unspecified: Secondary | ICD-10-CM | POA: Diagnosis not present

## 2022-12-09 DIAGNOSIS — C711 Malignant neoplasm of frontal lobe: Secondary | ICD-10-CM | POA: Diagnosis not present

## 2022-12-09 DIAGNOSIS — K219 Gastro-esophageal reflux disease without esophagitis: Secondary | ICD-10-CM | POA: Diagnosis not present

## 2022-12-10 DIAGNOSIS — C711 Malignant neoplasm of frontal lobe: Secondary | ICD-10-CM | POA: Diagnosis not present

## 2022-12-10 DIAGNOSIS — S42202D Unspecified fracture of upper end of left humerus, subsequent encounter for fracture with routine healing: Secondary | ICD-10-CM | POA: Diagnosis not present

## 2022-12-10 DIAGNOSIS — R4701 Aphasia: Secondary | ICD-10-CM | POA: Diagnosis not present

## 2022-12-10 DIAGNOSIS — K219 Gastro-esophageal reflux disease without esophagitis: Secondary | ICD-10-CM | POA: Diagnosis not present

## 2022-12-10 DIAGNOSIS — E785 Hyperlipidemia, unspecified: Secondary | ICD-10-CM | POA: Diagnosis not present

## 2022-12-10 DIAGNOSIS — E119 Type 2 diabetes mellitus without complications: Secondary | ICD-10-CM | POA: Diagnosis not present

## 2022-12-11 DIAGNOSIS — E119 Type 2 diabetes mellitus without complications: Secondary | ICD-10-CM | POA: Diagnosis not present

## 2022-12-11 DIAGNOSIS — R4701 Aphasia: Secondary | ICD-10-CM | POA: Diagnosis not present

## 2022-12-11 DIAGNOSIS — C711 Malignant neoplasm of frontal lobe: Secondary | ICD-10-CM | POA: Diagnosis not present

## 2022-12-11 DIAGNOSIS — S42202D Unspecified fracture of upper end of left humerus, subsequent encounter for fracture with routine healing: Secondary | ICD-10-CM | POA: Diagnosis not present

## 2022-12-11 DIAGNOSIS — E785 Hyperlipidemia, unspecified: Secondary | ICD-10-CM | POA: Diagnosis not present

## 2022-12-11 DIAGNOSIS — K219 Gastro-esophageal reflux disease without esophagitis: Secondary | ICD-10-CM | POA: Diagnosis not present

## 2022-12-12 DIAGNOSIS — C711 Malignant neoplasm of frontal lobe: Secondary | ICD-10-CM | POA: Diagnosis not present

## 2022-12-12 DIAGNOSIS — E119 Type 2 diabetes mellitus without complications: Secondary | ICD-10-CM | POA: Diagnosis not present

## 2022-12-12 DIAGNOSIS — E785 Hyperlipidemia, unspecified: Secondary | ICD-10-CM | POA: Diagnosis not present

## 2022-12-12 DIAGNOSIS — S42202D Unspecified fracture of upper end of left humerus, subsequent encounter for fracture with routine healing: Secondary | ICD-10-CM | POA: Diagnosis not present

## 2022-12-12 DIAGNOSIS — K219 Gastro-esophageal reflux disease without esophagitis: Secondary | ICD-10-CM | POA: Diagnosis not present

## 2022-12-12 DIAGNOSIS — R4701 Aphasia: Secondary | ICD-10-CM | POA: Diagnosis not present

## 2022-12-13 DIAGNOSIS — K219 Gastro-esophageal reflux disease without esophagitis: Secondary | ICD-10-CM | POA: Diagnosis not present

## 2022-12-13 DIAGNOSIS — R4701 Aphasia: Secondary | ICD-10-CM | POA: Diagnosis not present

## 2022-12-13 DIAGNOSIS — S42202D Unspecified fracture of upper end of left humerus, subsequent encounter for fracture with routine healing: Secondary | ICD-10-CM | POA: Diagnosis not present

## 2022-12-13 DIAGNOSIS — E785 Hyperlipidemia, unspecified: Secondary | ICD-10-CM | POA: Diagnosis not present

## 2022-12-13 DIAGNOSIS — C711 Malignant neoplasm of frontal lobe: Secondary | ICD-10-CM | POA: Diagnosis not present

## 2022-12-13 DIAGNOSIS — E119 Type 2 diabetes mellitus without complications: Secondary | ICD-10-CM | POA: Diagnosis not present

## 2022-12-14 DIAGNOSIS — E785 Hyperlipidemia, unspecified: Secondary | ICD-10-CM | POA: Diagnosis not present

## 2022-12-14 DIAGNOSIS — E119 Type 2 diabetes mellitus without complications: Secondary | ICD-10-CM | POA: Diagnosis not present

## 2022-12-14 DIAGNOSIS — R4701 Aphasia: Secondary | ICD-10-CM | POA: Diagnosis not present

## 2022-12-14 DIAGNOSIS — S42202D Unspecified fracture of upper end of left humerus, subsequent encounter for fracture with routine healing: Secondary | ICD-10-CM | POA: Diagnosis not present

## 2022-12-14 DIAGNOSIS — K219 Gastro-esophageal reflux disease without esophagitis: Secondary | ICD-10-CM | POA: Diagnosis not present

## 2022-12-14 DIAGNOSIS — C711 Malignant neoplasm of frontal lobe: Secondary | ICD-10-CM | POA: Diagnosis not present

## 2022-12-15 DIAGNOSIS — E119 Type 2 diabetes mellitus without complications: Secondary | ICD-10-CM | POA: Diagnosis not present

## 2022-12-15 DIAGNOSIS — S42202D Unspecified fracture of upper end of left humerus, subsequent encounter for fracture with routine healing: Secondary | ICD-10-CM | POA: Diagnosis not present

## 2022-12-15 DIAGNOSIS — C711 Malignant neoplasm of frontal lobe: Secondary | ICD-10-CM | POA: Diagnosis not present

## 2022-12-15 DIAGNOSIS — K219 Gastro-esophageal reflux disease without esophagitis: Secondary | ICD-10-CM | POA: Diagnosis not present

## 2022-12-15 DIAGNOSIS — R4701 Aphasia: Secondary | ICD-10-CM | POA: Diagnosis not present

## 2022-12-15 DIAGNOSIS — E785 Hyperlipidemia, unspecified: Secondary | ICD-10-CM | POA: Diagnosis not present

## 2022-12-16 DIAGNOSIS — E785 Hyperlipidemia, unspecified: Secondary | ICD-10-CM | POA: Diagnosis not present

## 2022-12-16 DIAGNOSIS — K219 Gastro-esophageal reflux disease without esophagitis: Secondary | ICD-10-CM | POA: Diagnosis not present

## 2022-12-16 DIAGNOSIS — R4701 Aphasia: Secondary | ICD-10-CM | POA: Diagnosis not present

## 2022-12-16 DIAGNOSIS — C711 Malignant neoplasm of frontal lobe: Secondary | ICD-10-CM | POA: Diagnosis not present

## 2022-12-16 DIAGNOSIS — E119 Type 2 diabetes mellitus without complications: Secondary | ICD-10-CM | POA: Diagnosis not present

## 2022-12-16 DIAGNOSIS — S42202D Unspecified fracture of upper end of left humerus, subsequent encounter for fracture with routine healing: Secondary | ICD-10-CM | POA: Diagnosis not present

## 2022-12-17 DIAGNOSIS — S42202D Unspecified fracture of upper end of left humerus, subsequent encounter for fracture with routine healing: Secondary | ICD-10-CM | POA: Diagnosis not present

## 2022-12-17 DIAGNOSIS — E785 Hyperlipidemia, unspecified: Secondary | ICD-10-CM | POA: Diagnosis not present

## 2022-12-17 DIAGNOSIS — K219 Gastro-esophageal reflux disease without esophagitis: Secondary | ICD-10-CM | POA: Diagnosis not present

## 2022-12-17 DIAGNOSIS — C711 Malignant neoplasm of frontal lobe: Secondary | ICD-10-CM | POA: Diagnosis not present

## 2022-12-17 DIAGNOSIS — R4701 Aphasia: Secondary | ICD-10-CM | POA: Diagnosis not present

## 2022-12-17 DIAGNOSIS — E119 Type 2 diabetes mellitus without complications: Secondary | ICD-10-CM | POA: Diagnosis not present

## 2022-12-18 DIAGNOSIS — S42202D Unspecified fracture of upper end of left humerus, subsequent encounter for fracture with routine healing: Secondary | ICD-10-CM | POA: Diagnosis not present

## 2022-12-18 DIAGNOSIS — R4701 Aphasia: Secondary | ICD-10-CM | POA: Diagnosis not present

## 2022-12-18 DIAGNOSIS — K219 Gastro-esophageal reflux disease without esophagitis: Secondary | ICD-10-CM | POA: Diagnosis not present

## 2022-12-18 DIAGNOSIS — C711 Malignant neoplasm of frontal lobe: Secondary | ICD-10-CM | POA: Diagnosis not present

## 2022-12-18 DIAGNOSIS — E785 Hyperlipidemia, unspecified: Secondary | ICD-10-CM | POA: Diagnosis not present

## 2022-12-18 DIAGNOSIS — E119 Type 2 diabetes mellitus without complications: Secondary | ICD-10-CM | POA: Diagnosis not present

## 2022-12-19 DIAGNOSIS — R4701 Aphasia: Secondary | ICD-10-CM | POA: Diagnosis not present

## 2022-12-19 DIAGNOSIS — C711 Malignant neoplasm of frontal lobe: Secondary | ICD-10-CM | POA: Diagnosis not present

## 2022-12-19 DIAGNOSIS — K219 Gastro-esophageal reflux disease without esophagitis: Secondary | ICD-10-CM | POA: Diagnosis not present

## 2022-12-19 DIAGNOSIS — E119 Type 2 diabetes mellitus without complications: Secondary | ICD-10-CM | POA: Diagnosis not present

## 2022-12-19 DIAGNOSIS — E785 Hyperlipidemia, unspecified: Secondary | ICD-10-CM | POA: Diagnosis not present

## 2022-12-19 DIAGNOSIS — S42202D Unspecified fracture of upper end of left humerus, subsequent encounter for fracture with routine healing: Secondary | ICD-10-CM | POA: Diagnosis not present

## 2022-12-20 DIAGNOSIS — R4701 Aphasia: Secondary | ICD-10-CM | POA: Diagnosis not present

## 2022-12-20 DIAGNOSIS — C711 Malignant neoplasm of frontal lobe: Secondary | ICD-10-CM | POA: Diagnosis not present

## 2022-12-20 DIAGNOSIS — S42202D Unspecified fracture of upper end of left humerus, subsequent encounter for fracture with routine healing: Secondary | ICD-10-CM | POA: Diagnosis not present

## 2022-12-20 DIAGNOSIS — E785 Hyperlipidemia, unspecified: Secondary | ICD-10-CM | POA: Diagnosis not present

## 2022-12-20 DIAGNOSIS — E119 Type 2 diabetes mellitus without complications: Secondary | ICD-10-CM | POA: Diagnosis not present

## 2022-12-20 DIAGNOSIS — K219 Gastro-esophageal reflux disease without esophagitis: Secondary | ICD-10-CM | POA: Diagnosis not present

## 2022-12-21 DIAGNOSIS — K219 Gastro-esophageal reflux disease without esophagitis: Secondary | ICD-10-CM | POA: Diagnosis not present

## 2022-12-21 DIAGNOSIS — E119 Type 2 diabetes mellitus without complications: Secondary | ICD-10-CM | POA: Diagnosis not present

## 2022-12-21 DIAGNOSIS — S42202D Unspecified fracture of upper end of left humerus, subsequent encounter for fracture with routine healing: Secondary | ICD-10-CM | POA: Diagnosis not present

## 2022-12-21 DIAGNOSIS — E785 Hyperlipidemia, unspecified: Secondary | ICD-10-CM | POA: Diagnosis not present

## 2022-12-21 DIAGNOSIS — C711 Malignant neoplasm of frontal lobe: Secondary | ICD-10-CM | POA: Diagnosis not present

## 2022-12-21 DIAGNOSIS — R4701 Aphasia: Secondary | ICD-10-CM | POA: Diagnosis not present

## 2022-12-22 DIAGNOSIS — C711 Malignant neoplasm of frontal lobe: Secondary | ICD-10-CM | POA: Diagnosis not present

## 2022-12-22 DIAGNOSIS — R32 Unspecified urinary incontinence: Secondary | ICD-10-CM | POA: Diagnosis not present

## 2022-12-22 DIAGNOSIS — S01112D Laceration without foreign body of left eyelid and periocular area, subsequent encounter: Secondary | ICD-10-CM | POA: Diagnosis not present

## 2022-12-22 DIAGNOSIS — R4701 Aphasia: Secondary | ICD-10-CM | POA: Diagnosis not present

## 2022-12-22 DIAGNOSIS — I10 Essential (primary) hypertension: Secondary | ICD-10-CM | POA: Diagnosis not present

## 2022-12-22 DIAGNOSIS — E785 Hyperlipidemia, unspecified: Secondary | ICD-10-CM | POA: Diagnosis not present

## 2022-12-22 DIAGNOSIS — S42202D Unspecified fracture of upper end of left humerus, subsequent encounter for fracture with routine healing: Secondary | ICD-10-CM | POA: Diagnosis not present

## 2022-12-22 DIAGNOSIS — E119 Type 2 diabetes mellitus without complications: Secondary | ICD-10-CM | POA: Diagnosis not present

## 2022-12-22 DIAGNOSIS — G40909 Epilepsy, unspecified, not intractable, without status epilepticus: Secondary | ICD-10-CM | POA: Diagnosis not present

## 2022-12-22 DIAGNOSIS — R296 Repeated falls: Secondary | ICD-10-CM | POA: Diagnosis not present

## 2022-12-22 DIAGNOSIS — K219 Gastro-esophageal reflux disease without esophagitis: Secondary | ICD-10-CM | POA: Diagnosis not present

## 2022-12-22 DIAGNOSIS — G8194 Hemiplegia, unspecified affecting left nondominant side: Secondary | ICD-10-CM | POA: Diagnosis not present

## 2022-12-23 DIAGNOSIS — C711 Malignant neoplasm of frontal lobe: Secondary | ICD-10-CM | POA: Diagnosis not present

## 2022-12-23 DIAGNOSIS — E119 Type 2 diabetes mellitus without complications: Secondary | ICD-10-CM | POA: Diagnosis not present

## 2022-12-23 DIAGNOSIS — K219 Gastro-esophageal reflux disease without esophagitis: Secondary | ICD-10-CM | POA: Diagnosis not present

## 2022-12-23 DIAGNOSIS — S42202D Unspecified fracture of upper end of left humerus, subsequent encounter for fracture with routine healing: Secondary | ICD-10-CM | POA: Diagnosis not present

## 2022-12-23 DIAGNOSIS — R4701 Aphasia: Secondary | ICD-10-CM | POA: Diagnosis not present

## 2022-12-23 DIAGNOSIS — E785 Hyperlipidemia, unspecified: Secondary | ICD-10-CM | POA: Diagnosis not present

## 2022-12-24 ENCOUNTER — Telehealth: Payer: Self-pay | Admitting: *Deleted

## 2022-12-24 NOTE — Telephone Encounter (Signed)
[facility] orders signed by Dr Barbaraann Cao & faxed to [facility].  Fax confirmation received.

## 2022-12-25 DIAGNOSIS — K219 Gastro-esophageal reflux disease without esophagitis: Secondary | ICD-10-CM | POA: Diagnosis not present

## 2022-12-25 DIAGNOSIS — E119 Type 2 diabetes mellitus without complications: Secondary | ICD-10-CM | POA: Diagnosis not present

## 2022-12-25 DIAGNOSIS — S42202D Unspecified fracture of upper end of left humerus, subsequent encounter for fracture with routine healing: Secondary | ICD-10-CM | POA: Diagnosis not present

## 2022-12-25 DIAGNOSIS — R4701 Aphasia: Secondary | ICD-10-CM | POA: Diagnosis not present

## 2022-12-25 DIAGNOSIS — C711 Malignant neoplasm of frontal lobe: Secondary | ICD-10-CM | POA: Diagnosis not present

## 2022-12-25 DIAGNOSIS — E785 Hyperlipidemia, unspecified: Secondary | ICD-10-CM | POA: Diagnosis not present

## 2022-12-28 DIAGNOSIS — E119 Type 2 diabetes mellitus without complications: Secondary | ICD-10-CM | POA: Diagnosis not present

## 2022-12-28 DIAGNOSIS — S42202D Unspecified fracture of upper end of left humerus, subsequent encounter for fracture with routine healing: Secondary | ICD-10-CM | POA: Diagnosis not present

## 2022-12-28 DIAGNOSIS — R4701 Aphasia: Secondary | ICD-10-CM | POA: Diagnosis not present

## 2022-12-28 DIAGNOSIS — K219 Gastro-esophageal reflux disease without esophagitis: Secondary | ICD-10-CM | POA: Diagnosis not present

## 2022-12-28 DIAGNOSIS — E785 Hyperlipidemia, unspecified: Secondary | ICD-10-CM | POA: Diagnosis not present

## 2022-12-28 DIAGNOSIS — C711 Malignant neoplasm of frontal lobe: Secondary | ICD-10-CM | POA: Diagnosis not present

## 2022-12-30 DIAGNOSIS — S42202D Unspecified fracture of upper end of left humerus, subsequent encounter for fracture with routine healing: Secondary | ICD-10-CM | POA: Diagnosis not present

## 2022-12-30 DIAGNOSIS — K219 Gastro-esophageal reflux disease without esophagitis: Secondary | ICD-10-CM | POA: Diagnosis not present

## 2022-12-30 DIAGNOSIS — E119 Type 2 diabetes mellitus without complications: Secondary | ICD-10-CM | POA: Diagnosis not present

## 2022-12-30 DIAGNOSIS — R4701 Aphasia: Secondary | ICD-10-CM | POA: Diagnosis not present

## 2022-12-30 DIAGNOSIS — E785 Hyperlipidemia, unspecified: Secondary | ICD-10-CM | POA: Diagnosis not present

## 2022-12-30 DIAGNOSIS — C711 Malignant neoplasm of frontal lobe: Secondary | ICD-10-CM | POA: Diagnosis not present

## 2023-01-01 DIAGNOSIS — R4701 Aphasia: Secondary | ICD-10-CM | POA: Diagnosis not present

## 2023-01-01 DIAGNOSIS — C711 Malignant neoplasm of frontal lobe: Secondary | ICD-10-CM | POA: Diagnosis not present

## 2023-01-01 DIAGNOSIS — E119 Type 2 diabetes mellitus without complications: Secondary | ICD-10-CM | POA: Diagnosis not present

## 2023-01-01 DIAGNOSIS — S42202D Unspecified fracture of upper end of left humerus, subsequent encounter for fracture with routine healing: Secondary | ICD-10-CM | POA: Diagnosis not present

## 2023-01-01 DIAGNOSIS — K219 Gastro-esophageal reflux disease without esophagitis: Secondary | ICD-10-CM | POA: Diagnosis not present

## 2023-01-01 DIAGNOSIS — E785 Hyperlipidemia, unspecified: Secondary | ICD-10-CM | POA: Diagnosis not present

## 2023-01-04 DIAGNOSIS — E785 Hyperlipidemia, unspecified: Secondary | ICD-10-CM | POA: Diagnosis not present

## 2023-01-04 DIAGNOSIS — K219 Gastro-esophageal reflux disease without esophagitis: Secondary | ICD-10-CM | POA: Diagnosis not present

## 2023-01-04 DIAGNOSIS — R4701 Aphasia: Secondary | ICD-10-CM | POA: Diagnosis not present

## 2023-01-04 DIAGNOSIS — C711 Malignant neoplasm of frontal lobe: Secondary | ICD-10-CM | POA: Diagnosis not present

## 2023-01-04 DIAGNOSIS — S42202D Unspecified fracture of upper end of left humerus, subsequent encounter for fracture with routine healing: Secondary | ICD-10-CM | POA: Diagnosis not present

## 2023-01-04 DIAGNOSIS — E119 Type 2 diabetes mellitus without complications: Secondary | ICD-10-CM | POA: Diagnosis not present

## 2023-01-06 DIAGNOSIS — R4701 Aphasia: Secondary | ICD-10-CM | POA: Diagnosis not present

## 2023-01-06 DIAGNOSIS — E785 Hyperlipidemia, unspecified: Secondary | ICD-10-CM | POA: Diagnosis not present

## 2023-01-06 DIAGNOSIS — K219 Gastro-esophageal reflux disease without esophagitis: Secondary | ICD-10-CM | POA: Diagnosis not present

## 2023-01-06 DIAGNOSIS — E119 Type 2 diabetes mellitus without complications: Secondary | ICD-10-CM | POA: Diagnosis not present

## 2023-01-06 DIAGNOSIS — C711 Malignant neoplasm of frontal lobe: Secondary | ICD-10-CM | POA: Diagnosis not present

## 2023-01-06 DIAGNOSIS — S42202D Unspecified fracture of upper end of left humerus, subsequent encounter for fracture with routine healing: Secondary | ICD-10-CM | POA: Diagnosis not present

## 2023-01-08 DIAGNOSIS — E119 Type 2 diabetes mellitus without complications: Secondary | ICD-10-CM | POA: Diagnosis not present

## 2023-01-08 DIAGNOSIS — K219 Gastro-esophageal reflux disease without esophagitis: Secondary | ICD-10-CM | POA: Diagnosis not present

## 2023-01-08 DIAGNOSIS — S42202D Unspecified fracture of upper end of left humerus, subsequent encounter for fracture with routine healing: Secondary | ICD-10-CM | POA: Diagnosis not present

## 2023-01-08 DIAGNOSIS — E785 Hyperlipidemia, unspecified: Secondary | ICD-10-CM | POA: Diagnosis not present

## 2023-01-08 DIAGNOSIS — R4701 Aphasia: Secondary | ICD-10-CM | POA: Diagnosis not present

## 2023-01-08 DIAGNOSIS — C711 Malignant neoplasm of frontal lobe: Secondary | ICD-10-CM | POA: Diagnosis not present

## 2023-01-11 DIAGNOSIS — R4701 Aphasia: Secondary | ICD-10-CM | POA: Diagnosis not present

## 2023-01-11 DIAGNOSIS — C711 Malignant neoplasm of frontal lobe: Secondary | ICD-10-CM | POA: Diagnosis not present

## 2023-01-11 DIAGNOSIS — K219 Gastro-esophageal reflux disease without esophagitis: Secondary | ICD-10-CM | POA: Diagnosis not present

## 2023-01-11 DIAGNOSIS — S42202D Unspecified fracture of upper end of left humerus, subsequent encounter for fracture with routine healing: Secondary | ICD-10-CM | POA: Diagnosis not present

## 2023-01-11 DIAGNOSIS — E119 Type 2 diabetes mellitus without complications: Secondary | ICD-10-CM | POA: Diagnosis not present

## 2023-01-11 DIAGNOSIS — E785 Hyperlipidemia, unspecified: Secondary | ICD-10-CM | POA: Diagnosis not present

## 2023-01-13 DIAGNOSIS — E119 Type 2 diabetes mellitus without complications: Secondary | ICD-10-CM | POA: Diagnosis not present

## 2023-01-13 DIAGNOSIS — E785 Hyperlipidemia, unspecified: Secondary | ICD-10-CM | POA: Diagnosis not present

## 2023-01-13 DIAGNOSIS — S42202D Unspecified fracture of upper end of left humerus, subsequent encounter for fracture with routine healing: Secondary | ICD-10-CM | POA: Diagnosis not present

## 2023-01-13 DIAGNOSIS — K219 Gastro-esophageal reflux disease without esophagitis: Secondary | ICD-10-CM | POA: Diagnosis not present

## 2023-01-13 DIAGNOSIS — C711 Malignant neoplasm of frontal lobe: Secondary | ICD-10-CM | POA: Diagnosis not present

## 2023-01-13 DIAGNOSIS — R4701 Aphasia: Secondary | ICD-10-CM | POA: Diagnosis not present

## 2023-01-15 DIAGNOSIS — E119 Type 2 diabetes mellitus without complications: Secondary | ICD-10-CM | POA: Diagnosis not present

## 2023-01-15 DIAGNOSIS — C711 Malignant neoplasm of frontal lobe: Secondary | ICD-10-CM | POA: Diagnosis not present

## 2023-01-15 DIAGNOSIS — K219 Gastro-esophageal reflux disease without esophagitis: Secondary | ICD-10-CM | POA: Diagnosis not present

## 2023-01-15 DIAGNOSIS — E785 Hyperlipidemia, unspecified: Secondary | ICD-10-CM | POA: Diagnosis not present

## 2023-01-15 DIAGNOSIS — S42202D Unspecified fracture of upper end of left humerus, subsequent encounter for fracture with routine healing: Secondary | ICD-10-CM | POA: Diagnosis not present

## 2023-01-15 DIAGNOSIS — R4701 Aphasia: Secondary | ICD-10-CM | POA: Diagnosis not present

## 2023-01-18 DIAGNOSIS — E785 Hyperlipidemia, unspecified: Secondary | ICD-10-CM | POA: Diagnosis not present

## 2023-01-18 DIAGNOSIS — S42202D Unspecified fracture of upper end of left humerus, subsequent encounter for fracture with routine healing: Secondary | ICD-10-CM | POA: Diagnosis not present

## 2023-01-18 DIAGNOSIS — C711 Malignant neoplasm of frontal lobe: Secondary | ICD-10-CM | POA: Diagnosis not present

## 2023-01-18 DIAGNOSIS — R4701 Aphasia: Secondary | ICD-10-CM | POA: Diagnosis not present

## 2023-01-18 DIAGNOSIS — E119 Type 2 diabetes mellitus without complications: Secondary | ICD-10-CM | POA: Diagnosis not present

## 2023-01-18 DIAGNOSIS — K219 Gastro-esophageal reflux disease without esophagitis: Secondary | ICD-10-CM | POA: Diagnosis not present

## 2023-01-20 DIAGNOSIS — C711 Malignant neoplasm of frontal lobe: Secondary | ICD-10-CM | POA: Diagnosis not present

## 2023-01-20 DIAGNOSIS — K219 Gastro-esophageal reflux disease without esophagitis: Secondary | ICD-10-CM | POA: Diagnosis not present

## 2023-01-20 DIAGNOSIS — S42202D Unspecified fracture of upper end of left humerus, subsequent encounter for fracture with routine healing: Secondary | ICD-10-CM | POA: Diagnosis not present

## 2023-01-20 DIAGNOSIS — E785 Hyperlipidemia, unspecified: Secondary | ICD-10-CM | POA: Diagnosis not present

## 2023-01-20 DIAGNOSIS — E119 Type 2 diabetes mellitus without complications: Secondary | ICD-10-CM | POA: Diagnosis not present

## 2023-01-20 DIAGNOSIS — R4701 Aphasia: Secondary | ICD-10-CM | POA: Diagnosis not present

## 2023-01-22 DIAGNOSIS — E119 Type 2 diabetes mellitus without complications: Secondary | ICD-10-CM | POA: Diagnosis not present

## 2023-01-22 DIAGNOSIS — S01112D Laceration without foreign body of left eyelid and periocular area, subsequent encounter: Secondary | ICD-10-CM | POA: Diagnosis not present

## 2023-01-22 DIAGNOSIS — S42202D Unspecified fracture of upper end of left humerus, subsequent encounter for fracture with routine healing: Secondary | ICD-10-CM | POA: Diagnosis not present

## 2023-01-22 DIAGNOSIS — I10 Essential (primary) hypertension: Secondary | ICD-10-CM | POA: Diagnosis not present

## 2023-01-22 DIAGNOSIS — K219 Gastro-esophageal reflux disease without esophagitis: Secondary | ICD-10-CM | POA: Diagnosis not present

## 2023-01-22 DIAGNOSIS — R32 Unspecified urinary incontinence: Secondary | ICD-10-CM | POA: Diagnosis not present

## 2023-01-22 DIAGNOSIS — G8194 Hemiplegia, unspecified affecting left nondominant side: Secondary | ICD-10-CM | POA: Diagnosis not present

## 2023-01-22 DIAGNOSIS — G40909 Epilepsy, unspecified, not intractable, without status epilepticus: Secondary | ICD-10-CM | POA: Diagnosis not present

## 2023-01-22 DIAGNOSIS — R4701 Aphasia: Secondary | ICD-10-CM | POA: Diagnosis not present

## 2023-01-22 DIAGNOSIS — R296 Repeated falls: Secondary | ICD-10-CM | POA: Diagnosis not present

## 2023-01-22 DIAGNOSIS — C711 Malignant neoplasm of frontal lobe: Secondary | ICD-10-CM | POA: Diagnosis not present

## 2023-01-22 DIAGNOSIS — E785 Hyperlipidemia, unspecified: Secondary | ICD-10-CM | POA: Diagnosis not present

## 2023-01-25 DIAGNOSIS — E119 Type 2 diabetes mellitus without complications: Secondary | ICD-10-CM | POA: Diagnosis not present

## 2023-01-25 DIAGNOSIS — C711 Malignant neoplasm of frontal lobe: Secondary | ICD-10-CM | POA: Diagnosis not present

## 2023-01-25 DIAGNOSIS — K219 Gastro-esophageal reflux disease without esophagitis: Secondary | ICD-10-CM | POA: Diagnosis not present

## 2023-01-25 DIAGNOSIS — R4701 Aphasia: Secondary | ICD-10-CM | POA: Diagnosis not present

## 2023-01-25 DIAGNOSIS — E785 Hyperlipidemia, unspecified: Secondary | ICD-10-CM | POA: Diagnosis not present

## 2023-01-25 DIAGNOSIS — S42202D Unspecified fracture of upper end of left humerus, subsequent encounter for fracture with routine healing: Secondary | ICD-10-CM | POA: Diagnosis not present

## 2023-01-27 DIAGNOSIS — R4701 Aphasia: Secondary | ICD-10-CM | POA: Diagnosis not present

## 2023-01-27 DIAGNOSIS — E119 Type 2 diabetes mellitus without complications: Secondary | ICD-10-CM | POA: Diagnosis not present

## 2023-01-27 DIAGNOSIS — S42202D Unspecified fracture of upper end of left humerus, subsequent encounter for fracture with routine healing: Secondary | ICD-10-CM | POA: Diagnosis not present

## 2023-01-27 DIAGNOSIS — C711 Malignant neoplasm of frontal lobe: Secondary | ICD-10-CM | POA: Diagnosis not present

## 2023-01-27 DIAGNOSIS — E785 Hyperlipidemia, unspecified: Secondary | ICD-10-CM | POA: Diagnosis not present

## 2023-01-27 DIAGNOSIS — K219 Gastro-esophageal reflux disease without esophagitis: Secondary | ICD-10-CM | POA: Diagnosis not present

## 2023-02-01 DIAGNOSIS — K219 Gastro-esophageal reflux disease without esophagitis: Secondary | ICD-10-CM | POA: Diagnosis not present

## 2023-02-01 DIAGNOSIS — E119 Type 2 diabetes mellitus without complications: Secondary | ICD-10-CM | POA: Diagnosis not present

## 2023-02-01 DIAGNOSIS — R4701 Aphasia: Secondary | ICD-10-CM | POA: Diagnosis not present

## 2023-02-01 DIAGNOSIS — C711 Malignant neoplasm of frontal lobe: Secondary | ICD-10-CM | POA: Diagnosis not present

## 2023-02-01 DIAGNOSIS — E785 Hyperlipidemia, unspecified: Secondary | ICD-10-CM | POA: Diagnosis not present

## 2023-02-01 DIAGNOSIS — S42202D Unspecified fracture of upper end of left humerus, subsequent encounter for fracture with routine healing: Secondary | ICD-10-CM | POA: Diagnosis not present

## 2023-02-03 DIAGNOSIS — S42202D Unspecified fracture of upper end of left humerus, subsequent encounter for fracture with routine healing: Secondary | ICD-10-CM | POA: Diagnosis not present

## 2023-02-03 DIAGNOSIS — E785 Hyperlipidemia, unspecified: Secondary | ICD-10-CM | POA: Diagnosis not present

## 2023-02-03 DIAGNOSIS — K219 Gastro-esophageal reflux disease without esophagitis: Secondary | ICD-10-CM | POA: Diagnosis not present

## 2023-02-03 DIAGNOSIS — C711 Malignant neoplasm of frontal lobe: Secondary | ICD-10-CM | POA: Diagnosis not present

## 2023-02-03 DIAGNOSIS — E119 Type 2 diabetes mellitus without complications: Secondary | ICD-10-CM | POA: Diagnosis not present

## 2023-02-03 DIAGNOSIS — R4701 Aphasia: Secondary | ICD-10-CM | POA: Diagnosis not present

## 2023-02-05 DIAGNOSIS — E785 Hyperlipidemia, unspecified: Secondary | ICD-10-CM | POA: Diagnosis not present

## 2023-02-05 DIAGNOSIS — E119 Type 2 diabetes mellitus without complications: Secondary | ICD-10-CM | POA: Diagnosis not present

## 2023-02-05 DIAGNOSIS — R4701 Aphasia: Secondary | ICD-10-CM | POA: Diagnosis not present

## 2023-02-05 DIAGNOSIS — S42202D Unspecified fracture of upper end of left humerus, subsequent encounter for fracture with routine healing: Secondary | ICD-10-CM | POA: Diagnosis not present

## 2023-02-05 DIAGNOSIS — K219 Gastro-esophageal reflux disease without esophagitis: Secondary | ICD-10-CM | POA: Diagnosis not present

## 2023-02-05 DIAGNOSIS — C711 Malignant neoplasm of frontal lobe: Secondary | ICD-10-CM | POA: Diagnosis not present

## 2023-02-08 DIAGNOSIS — C711 Malignant neoplasm of frontal lobe: Secondary | ICD-10-CM | POA: Diagnosis not present

## 2023-02-08 DIAGNOSIS — E785 Hyperlipidemia, unspecified: Secondary | ICD-10-CM | POA: Diagnosis not present

## 2023-02-08 DIAGNOSIS — R4701 Aphasia: Secondary | ICD-10-CM | POA: Diagnosis not present

## 2023-02-08 DIAGNOSIS — E119 Type 2 diabetes mellitus without complications: Secondary | ICD-10-CM | POA: Diagnosis not present

## 2023-02-08 DIAGNOSIS — S42202D Unspecified fracture of upper end of left humerus, subsequent encounter for fracture with routine healing: Secondary | ICD-10-CM | POA: Diagnosis not present

## 2023-02-08 DIAGNOSIS — K219 Gastro-esophageal reflux disease without esophagitis: Secondary | ICD-10-CM | POA: Diagnosis not present

## 2023-02-10 DIAGNOSIS — S42202D Unspecified fracture of upper end of left humerus, subsequent encounter for fracture with routine healing: Secondary | ICD-10-CM | POA: Diagnosis not present

## 2023-02-10 DIAGNOSIS — K219 Gastro-esophageal reflux disease without esophagitis: Secondary | ICD-10-CM | POA: Diagnosis not present

## 2023-02-10 DIAGNOSIS — E119 Type 2 diabetes mellitus without complications: Secondary | ICD-10-CM | POA: Diagnosis not present

## 2023-02-10 DIAGNOSIS — C711 Malignant neoplasm of frontal lobe: Secondary | ICD-10-CM | POA: Diagnosis not present

## 2023-02-10 DIAGNOSIS — R4701 Aphasia: Secondary | ICD-10-CM | POA: Diagnosis not present

## 2023-02-10 DIAGNOSIS — E785 Hyperlipidemia, unspecified: Secondary | ICD-10-CM | POA: Diagnosis not present

## 2023-02-12 DIAGNOSIS — R4701 Aphasia: Secondary | ICD-10-CM | POA: Diagnosis not present

## 2023-02-12 DIAGNOSIS — C711 Malignant neoplasm of frontal lobe: Secondary | ICD-10-CM | POA: Diagnosis not present

## 2023-02-12 DIAGNOSIS — S42202D Unspecified fracture of upper end of left humerus, subsequent encounter for fracture with routine healing: Secondary | ICD-10-CM | POA: Diagnosis not present

## 2023-02-12 DIAGNOSIS — E785 Hyperlipidemia, unspecified: Secondary | ICD-10-CM | POA: Diagnosis not present

## 2023-02-12 DIAGNOSIS — K219 Gastro-esophageal reflux disease without esophagitis: Secondary | ICD-10-CM | POA: Diagnosis not present

## 2023-02-12 DIAGNOSIS — E119 Type 2 diabetes mellitus without complications: Secondary | ICD-10-CM | POA: Diagnosis not present

## 2023-02-14 DIAGNOSIS — S42202D Unspecified fracture of upper end of left humerus, subsequent encounter for fracture with routine healing: Secondary | ICD-10-CM | POA: Diagnosis not present

## 2023-02-14 DIAGNOSIS — E119 Type 2 diabetes mellitus without complications: Secondary | ICD-10-CM | POA: Diagnosis not present

## 2023-02-14 DIAGNOSIS — R4701 Aphasia: Secondary | ICD-10-CM | POA: Diagnosis not present

## 2023-02-14 DIAGNOSIS — C711 Malignant neoplasm of frontal lobe: Secondary | ICD-10-CM | POA: Diagnosis not present

## 2023-02-14 DIAGNOSIS — K219 Gastro-esophageal reflux disease without esophagitis: Secondary | ICD-10-CM | POA: Diagnosis not present

## 2023-02-14 DIAGNOSIS — E785 Hyperlipidemia, unspecified: Secondary | ICD-10-CM | POA: Diagnosis not present

## 2023-02-15 DIAGNOSIS — E785 Hyperlipidemia, unspecified: Secondary | ICD-10-CM | POA: Diagnosis not present

## 2023-02-15 DIAGNOSIS — C711 Malignant neoplasm of frontal lobe: Secondary | ICD-10-CM | POA: Diagnosis not present

## 2023-02-15 DIAGNOSIS — E119 Type 2 diabetes mellitus without complications: Secondary | ICD-10-CM | POA: Diagnosis not present

## 2023-02-15 DIAGNOSIS — S42202D Unspecified fracture of upper end of left humerus, subsequent encounter for fracture with routine healing: Secondary | ICD-10-CM | POA: Diagnosis not present

## 2023-02-15 DIAGNOSIS — R4701 Aphasia: Secondary | ICD-10-CM | POA: Diagnosis not present

## 2023-02-15 DIAGNOSIS — K219 Gastro-esophageal reflux disease without esophagitis: Secondary | ICD-10-CM | POA: Diagnosis not present

## 2023-02-16 DIAGNOSIS — R4701 Aphasia: Secondary | ICD-10-CM | POA: Diagnosis not present

## 2023-02-16 DIAGNOSIS — K219 Gastro-esophageal reflux disease without esophagitis: Secondary | ICD-10-CM | POA: Diagnosis not present

## 2023-02-16 DIAGNOSIS — S42202D Unspecified fracture of upper end of left humerus, subsequent encounter for fracture with routine healing: Secondary | ICD-10-CM | POA: Diagnosis not present

## 2023-02-16 DIAGNOSIS — E785 Hyperlipidemia, unspecified: Secondary | ICD-10-CM | POA: Diagnosis not present

## 2023-02-16 DIAGNOSIS — C711 Malignant neoplasm of frontal lobe: Secondary | ICD-10-CM | POA: Diagnosis not present

## 2023-02-16 DIAGNOSIS — E119 Type 2 diabetes mellitus without complications: Secondary | ICD-10-CM | POA: Diagnosis not present

## 2023-02-17 DIAGNOSIS — K219 Gastro-esophageal reflux disease without esophagitis: Secondary | ICD-10-CM | POA: Diagnosis not present

## 2023-02-17 DIAGNOSIS — E785 Hyperlipidemia, unspecified: Secondary | ICD-10-CM | POA: Diagnosis not present

## 2023-02-17 DIAGNOSIS — S42202D Unspecified fracture of upper end of left humerus, subsequent encounter for fracture with routine healing: Secondary | ICD-10-CM | POA: Diagnosis not present

## 2023-02-17 DIAGNOSIS — C711 Malignant neoplasm of frontal lobe: Secondary | ICD-10-CM | POA: Diagnosis not present

## 2023-02-17 DIAGNOSIS — R4701 Aphasia: Secondary | ICD-10-CM | POA: Diagnosis not present

## 2023-02-17 DIAGNOSIS — E119 Type 2 diabetes mellitus without complications: Secondary | ICD-10-CM | POA: Diagnosis not present

## 2023-02-18 DIAGNOSIS — R4701 Aphasia: Secondary | ICD-10-CM | POA: Diagnosis not present

## 2023-02-18 DIAGNOSIS — E785 Hyperlipidemia, unspecified: Secondary | ICD-10-CM | POA: Diagnosis not present

## 2023-02-18 DIAGNOSIS — C711 Malignant neoplasm of frontal lobe: Secondary | ICD-10-CM | POA: Diagnosis not present

## 2023-02-18 DIAGNOSIS — K219 Gastro-esophageal reflux disease without esophagitis: Secondary | ICD-10-CM | POA: Diagnosis not present

## 2023-02-18 DIAGNOSIS — S42202D Unspecified fracture of upper end of left humerus, subsequent encounter for fracture with routine healing: Secondary | ICD-10-CM | POA: Diagnosis not present

## 2023-02-18 DIAGNOSIS — E119 Type 2 diabetes mellitus without complications: Secondary | ICD-10-CM | POA: Diagnosis not present

## 2023-02-19 DIAGNOSIS — E785 Hyperlipidemia, unspecified: Secondary | ICD-10-CM | POA: Diagnosis not present

## 2023-02-19 DIAGNOSIS — C711 Malignant neoplasm of frontal lobe: Secondary | ICD-10-CM | POA: Diagnosis not present

## 2023-02-19 DIAGNOSIS — S42202D Unspecified fracture of upper end of left humerus, subsequent encounter for fracture with routine healing: Secondary | ICD-10-CM | POA: Diagnosis not present

## 2023-02-19 DIAGNOSIS — R4701 Aphasia: Secondary | ICD-10-CM | POA: Diagnosis not present

## 2023-02-19 DIAGNOSIS — K219 Gastro-esophageal reflux disease without esophagitis: Secondary | ICD-10-CM | POA: Diagnosis not present

## 2023-02-19 DIAGNOSIS — E119 Type 2 diabetes mellitus without complications: Secondary | ICD-10-CM | POA: Diagnosis not present

## 2023-02-20 DIAGNOSIS — S42202D Unspecified fracture of upper end of left humerus, subsequent encounter for fracture with routine healing: Secondary | ICD-10-CM | POA: Diagnosis not present

## 2023-02-20 DIAGNOSIS — R4701 Aphasia: Secondary | ICD-10-CM | POA: Diagnosis not present

## 2023-02-20 DIAGNOSIS — C711 Malignant neoplasm of frontal lobe: Secondary | ICD-10-CM | POA: Diagnosis not present

## 2023-02-20 DIAGNOSIS — E785 Hyperlipidemia, unspecified: Secondary | ICD-10-CM | POA: Diagnosis not present

## 2023-02-20 DIAGNOSIS — E119 Type 2 diabetes mellitus without complications: Secondary | ICD-10-CM | POA: Diagnosis not present

## 2023-02-20 DIAGNOSIS — K219 Gastro-esophageal reflux disease without esophagitis: Secondary | ICD-10-CM | POA: Diagnosis not present

## 2023-02-21 DIAGNOSIS — E785 Hyperlipidemia, unspecified: Secondary | ICD-10-CM | POA: Diagnosis not present

## 2023-02-21 DIAGNOSIS — R4701 Aphasia: Secondary | ICD-10-CM | POA: Diagnosis not present

## 2023-02-21 DIAGNOSIS — E119 Type 2 diabetes mellitus without complications: Secondary | ICD-10-CM | POA: Diagnosis not present

## 2023-02-21 DIAGNOSIS — K219 Gastro-esophageal reflux disease without esophagitis: Secondary | ICD-10-CM | POA: Diagnosis not present

## 2023-02-21 DIAGNOSIS — S42202D Unspecified fracture of upper end of left humerus, subsequent encounter for fracture with routine healing: Secondary | ICD-10-CM | POA: Diagnosis not present

## 2023-02-21 DIAGNOSIS — R32 Unspecified urinary incontinence: Secondary | ICD-10-CM | POA: Diagnosis not present

## 2023-02-21 DIAGNOSIS — S01112D Laceration without foreign body of left eyelid and periocular area, subsequent encounter: Secondary | ICD-10-CM | POA: Diagnosis not present

## 2023-02-21 DIAGNOSIS — G40909 Epilepsy, unspecified, not intractable, without status epilepticus: Secondary | ICD-10-CM | POA: Diagnosis not present

## 2023-02-21 DIAGNOSIS — C711 Malignant neoplasm of frontal lobe: Secondary | ICD-10-CM | POA: Diagnosis not present

## 2023-02-21 DIAGNOSIS — R296 Repeated falls: Secondary | ICD-10-CM | POA: Diagnosis not present

## 2023-02-21 DIAGNOSIS — G8194 Hemiplegia, unspecified affecting left nondominant side: Secondary | ICD-10-CM | POA: Diagnosis not present

## 2023-02-21 DIAGNOSIS — I10 Essential (primary) hypertension: Secondary | ICD-10-CM | POA: Diagnosis not present

## 2023-02-22 DIAGNOSIS — E119 Type 2 diabetes mellitus without complications: Secondary | ICD-10-CM | POA: Diagnosis not present

## 2023-02-22 DIAGNOSIS — E785 Hyperlipidemia, unspecified: Secondary | ICD-10-CM | POA: Diagnosis not present

## 2023-02-22 DIAGNOSIS — S42202D Unspecified fracture of upper end of left humerus, subsequent encounter for fracture with routine healing: Secondary | ICD-10-CM | POA: Diagnosis not present

## 2023-02-22 DIAGNOSIS — K219 Gastro-esophageal reflux disease without esophagitis: Secondary | ICD-10-CM | POA: Diagnosis not present

## 2023-02-22 DIAGNOSIS — C711 Malignant neoplasm of frontal lobe: Secondary | ICD-10-CM | POA: Diagnosis not present

## 2023-02-22 DIAGNOSIS — R4701 Aphasia: Secondary | ICD-10-CM | POA: Diagnosis not present

## 2023-02-24 DIAGNOSIS — E785 Hyperlipidemia, unspecified: Secondary | ICD-10-CM | POA: Diagnosis not present

## 2023-02-24 DIAGNOSIS — S42202D Unspecified fracture of upper end of left humerus, subsequent encounter for fracture with routine healing: Secondary | ICD-10-CM | POA: Diagnosis not present

## 2023-02-24 DIAGNOSIS — E119 Type 2 diabetes mellitus without complications: Secondary | ICD-10-CM | POA: Diagnosis not present

## 2023-02-24 DIAGNOSIS — R4701 Aphasia: Secondary | ICD-10-CM | POA: Diagnosis not present

## 2023-02-24 DIAGNOSIS — K219 Gastro-esophageal reflux disease without esophagitis: Secondary | ICD-10-CM | POA: Diagnosis not present

## 2023-02-24 DIAGNOSIS — C711 Malignant neoplasm of frontal lobe: Secondary | ICD-10-CM | POA: Diagnosis not present

## 2023-02-26 DIAGNOSIS — S42202D Unspecified fracture of upper end of left humerus, subsequent encounter for fracture with routine healing: Secondary | ICD-10-CM | POA: Diagnosis not present

## 2023-02-26 DIAGNOSIS — E785 Hyperlipidemia, unspecified: Secondary | ICD-10-CM | POA: Diagnosis not present

## 2023-02-26 DIAGNOSIS — C711 Malignant neoplasm of frontal lobe: Secondary | ICD-10-CM | POA: Diagnosis not present

## 2023-02-26 DIAGNOSIS — K219 Gastro-esophageal reflux disease without esophagitis: Secondary | ICD-10-CM | POA: Diagnosis not present

## 2023-02-26 DIAGNOSIS — E119 Type 2 diabetes mellitus without complications: Secondary | ICD-10-CM | POA: Diagnosis not present

## 2023-02-26 DIAGNOSIS — R4701 Aphasia: Secondary | ICD-10-CM | POA: Diagnosis not present

## 2023-03-01 DIAGNOSIS — R4701 Aphasia: Secondary | ICD-10-CM | POA: Diagnosis not present

## 2023-03-01 DIAGNOSIS — C711 Malignant neoplasm of frontal lobe: Secondary | ICD-10-CM | POA: Diagnosis not present

## 2023-03-01 DIAGNOSIS — E119 Type 2 diabetes mellitus without complications: Secondary | ICD-10-CM | POA: Diagnosis not present

## 2023-03-01 DIAGNOSIS — E785 Hyperlipidemia, unspecified: Secondary | ICD-10-CM | POA: Diagnosis not present

## 2023-03-01 DIAGNOSIS — K219 Gastro-esophageal reflux disease without esophagitis: Secondary | ICD-10-CM | POA: Diagnosis not present

## 2023-03-01 DIAGNOSIS — S42202D Unspecified fracture of upper end of left humerus, subsequent encounter for fracture with routine healing: Secondary | ICD-10-CM | POA: Diagnosis not present

## 2023-03-03 DIAGNOSIS — K219 Gastro-esophageal reflux disease without esophagitis: Secondary | ICD-10-CM | POA: Diagnosis not present

## 2023-03-03 DIAGNOSIS — C711 Malignant neoplasm of frontal lobe: Secondary | ICD-10-CM | POA: Diagnosis not present

## 2023-03-03 DIAGNOSIS — E119 Type 2 diabetes mellitus without complications: Secondary | ICD-10-CM | POA: Diagnosis not present

## 2023-03-03 DIAGNOSIS — E785 Hyperlipidemia, unspecified: Secondary | ICD-10-CM | POA: Diagnosis not present

## 2023-03-03 DIAGNOSIS — S42202D Unspecified fracture of upper end of left humerus, subsequent encounter for fracture with routine healing: Secondary | ICD-10-CM | POA: Diagnosis not present

## 2023-03-03 DIAGNOSIS — R4701 Aphasia: Secondary | ICD-10-CM | POA: Diagnosis not present

## 2023-03-05 DIAGNOSIS — K219 Gastro-esophageal reflux disease without esophagitis: Secondary | ICD-10-CM | POA: Diagnosis not present

## 2023-03-05 DIAGNOSIS — C711 Malignant neoplasm of frontal lobe: Secondary | ICD-10-CM | POA: Diagnosis not present

## 2023-03-05 DIAGNOSIS — E119 Type 2 diabetes mellitus without complications: Secondary | ICD-10-CM | POA: Diagnosis not present

## 2023-03-05 DIAGNOSIS — R4701 Aphasia: Secondary | ICD-10-CM | POA: Diagnosis not present

## 2023-03-05 DIAGNOSIS — S42202D Unspecified fracture of upper end of left humerus, subsequent encounter for fracture with routine healing: Secondary | ICD-10-CM | POA: Diagnosis not present

## 2023-03-05 DIAGNOSIS — E785 Hyperlipidemia, unspecified: Secondary | ICD-10-CM | POA: Diagnosis not present

## 2023-03-08 DIAGNOSIS — E119 Type 2 diabetes mellitus without complications: Secondary | ICD-10-CM | POA: Diagnosis not present

## 2023-03-08 DIAGNOSIS — R4701 Aphasia: Secondary | ICD-10-CM | POA: Diagnosis not present

## 2023-03-08 DIAGNOSIS — C711 Malignant neoplasm of frontal lobe: Secondary | ICD-10-CM | POA: Diagnosis not present

## 2023-03-08 DIAGNOSIS — E785 Hyperlipidemia, unspecified: Secondary | ICD-10-CM | POA: Diagnosis not present

## 2023-03-08 DIAGNOSIS — K219 Gastro-esophageal reflux disease without esophagitis: Secondary | ICD-10-CM | POA: Diagnosis not present

## 2023-03-08 DIAGNOSIS — S42202D Unspecified fracture of upper end of left humerus, subsequent encounter for fracture with routine healing: Secondary | ICD-10-CM | POA: Diagnosis not present

## 2023-03-10 DIAGNOSIS — R4701 Aphasia: Secondary | ICD-10-CM | POA: Diagnosis not present

## 2023-03-10 DIAGNOSIS — E785 Hyperlipidemia, unspecified: Secondary | ICD-10-CM | POA: Diagnosis not present

## 2023-03-10 DIAGNOSIS — K219 Gastro-esophageal reflux disease without esophagitis: Secondary | ICD-10-CM | POA: Diagnosis not present

## 2023-03-10 DIAGNOSIS — E119 Type 2 diabetes mellitus without complications: Secondary | ICD-10-CM | POA: Diagnosis not present

## 2023-03-10 DIAGNOSIS — S42202D Unspecified fracture of upper end of left humerus, subsequent encounter for fracture with routine healing: Secondary | ICD-10-CM | POA: Diagnosis not present

## 2023-03-10 DIAGNOSIS — C711 Malignant neoplasm of frontal lobe: Secondary | ICD-10-CM | POA: Diagnosis not present

## 2023-03-12 DIAGNOSIS — E785 Hyperlipidemia, unspecified: Secondary | ICD-10-CM | POA: Diagnosis not present

## 2023-03-12 DIAGNOSIS — E119 Type 2 diabetes mellitus without complications: Secondary | ICD-10-CM | POA: Diagnosis not present

## 2023-03-12 DIAGNOSIS — C711 Malignant neoplasm of frontal lobe: Secondary | ICD-10-CM | POA: Diagnosis not present

## 2023-03-12 DIAGNOSIS — S42202D Unspecified fracture of upper end of left humerus, subsequent encounter for fracture with routine healing: Secondary | ICD-10-CM | POA: Diagnosis not present

## 2023-03-12 DIAGNOSIS — K219 Gastro-esophageal reflux disease without esophagitis: Secondary | ICD-10-CM | POA: Diagnosis not present

## 2023-03-12 DIAGNOSIS — R4701 Aphasia: Secondary | ICD-10-CM | POA: Diagnosis not present

## 2023-03-15 DIAGNOSIS — C711 Malignant neoplasm of frontal lobe: Secondary | ICD-10-CM | POA: Diagnosis not present

## 2023-03-15 DIAGNOSIS — S42202D Unspecified fracture of upper end of left humerus, subsequent encounter for fracture with routine healing: Secondary | ICD-10-CM | POA: Diagnosis not present

## 2023-03-15 DIAGNOSIS — E785 Hyperlipidemia, unspecified: Secondary | ICD-10-CM | POA: Diagnosis not present

## 2023-03-15 DIAGNOSIS — K219 Gastro-esophageal reflux disease without esophagitis: Secondary | ICD-10-CM | POA: Diagnosis not present

## 2023-03-15 DIAGNOSIS — R4701 Aphasia: Secondary | ICD-10-CM | POA: Diagnosis not present

## 2023-03-15 DIAGNOSIS — E119 Type 2 diabetes mellitus without complications: Secondary | ICD-10-CM | POA: Diagnosis not present

## 2023-03-19 DIAGNOSIS — S42202D Unspecified fracture of upper end of left humerus, subsequent encounter for fracture with routine healing: Secondary | ICD-10-CM | POA: Diagnosis not present

## 2023-03-19 DIAGNOSIS — C711 Malignant neoplasm of frontal lobe: Secondary | ICD-10-CM | POA: Diagnosis not present

## 2023-03-19 DIAGNOSIS — R4701 Aphasia: Secondary | ICD-10-CM | POA: Diagnosis not present

## 2023-03-19 DIAGNOSIS — K219 Gastro-esophageal reflux disease without esophagitis: Secondary | ICD-10-CM | POA: Diagnosis not present

## 2023-03-19 DIAGNOSIS — E119 Type 2 diabetes mellitus without complications: Secondary | ICD-10-CM | POA: Diagnosis not present

## 2023-03-19 DIAGNOSIS — E785 Hyperlipidemia, unspecified: Secondary | ICD-10-CM | POA: Diagnosis not present

## 2023-03-22 DIAGNOSIS — E785 Hyperlipidemia, unspecified: Secondary | ICD-10-CM | POA: Diagnosis not present

## 2023-03-22 DIAGNOSIS — R4701 Aphasia: Secondary | ICD-10-CM | POA: Diagnosis not present

## 2023-03-22 DIAGNOSIS — S42202D Unspecified fracture of upper end of left humerus, subsequent encounter for fracture with routine healing: Secondary | ICD-10-CM | POA: Diagnosis not present

## 2023-03-22 DIAGNOSIS — C711 Malignant neoplasm of frontal lobe: Secondary | ICD-10-CM | POA: Diagnosis not present

## 2023-03-22 DIAGNOSIS — E119 Type 2 diabetes mellitus without complications: Secondary | ICD-10-CM | POA: Diagnosis not present

## 2023-03-22 DIAGNOSIS — K219 Gastro-esophageal reflux disease without esophagitis: Secondary | ICD-10-CM | POA: Diagnosis not present

## 2023-03-23 DIAGNOSIS — C711 Malignant neoplasm of frontal lobe: Secondary | ICD-10-CM | POA: Diagnosis not present

## 2023-03-23 DIAGNOSIS — R4701 Aphasia: Secondary | ICD-10-CM | POA: Diagnosis not present

## 2023-03-23 DIAGNOSIS — K219 Gastro-esophageal reflux disease without esophagitis: Secondary | ICD-10-CM | POA: Diagnosis not present

## 2023-03-23 DIAGNOSIS — E785 Hyperlipidemia, unspecified: Secondary | ICD-10-CM | POA: Diagnosis not present

## 2023-03-23 DIAGNOSIS — E119 Type 2 diabetes mellitus without complications: Secondary | ICD-10-CM | POA: Diagnosis not present

## 2023-03-23 DIAGNOSIS — S42202D Unspecified fracture of upper end of left humerus, subsequent encounter for fracture with routine healing: Secondary | ICD-10-CM | POA: Diagnosis not present

## 2023-03-24 DIAGNOSIS — G8194 Hemiplegia, unspecified affecting left nondominant side: Secondary | ICD-10-CM | POA: Diagnosis not present

## 2023-03-24 DIAGNOSIS — E785 Hyperlipidemia, unspecified: Secondary | ICD-10-CM | POA: Diagnosis not present

## 2023-03-24 DIAGNOSIS — R4701 Aphasia: Secondary | ICD-10-CM | POA: Diagnosis not present

## 2023-03-24 DIAGNOSIS — G40909 Epilepsy, unspecified, not intractable, without status epilepticus: Secondary | ICD-10-CM | POA: Diagnosis not present

## 2023-03-24 DIAGNOSIS — K219 Gastro-esophageal reflux disease without esophagitis: Secondary | ICD-10-CM | POA: Diagnosis not present

## 2023-03-24 DIAGNOSIS — S42202D Unspecified fracture of upper end of left humerus, subsequent encounter for fracture with routine healing: Secondary | ICD-10-CM | POA: Diagnosis not present

## 2023-03-24 DIAGNOSIS — I10 Essential (primary) hypertension: Secondary | ICD-10-CM | POA: Diagnosis not present

## 2023-03-24 DIAGNOSIS — E119 Type 2 diabetes mellitus without complications: Secondary | ICD-10-CM | POA: Diagnosis not present

## 2023-03-24 DIAGNOSIS — R296 Repeated falls: Secondary | ICD-10-CM | POA: Diagnosis not present

## 2023-03-24 DIAGNOSIS — R32 Unspecified urinary incontinence: Secondary | ICD-10-CM | POA: Diagnosis not present

## 2023-03-24 DIAGNOSIS — C711 Malignant neoplasm of frontal lobe: Secondary | ICD-10-CM | POA: Diagnosis not present

## 2023-03-24 DIAGNOSIS — S01112D Laceration without foreign body of left eyelid and periocular area, subsequent encounter: Secondary | ICD-10-CM | POA: Diagnosis not present

## 2023-03-26 DIAGNOSIS — S42202D Unspecified fracture of upper end of left humerus, subsequent encounter for fracture with routine healing: Secondary | ICD-10-CM | POA: Diagnosis not present

## 2023-03-26 DIAGNOSIS — R4701 Aphasia: Secondary | ICD-10-CM | POA: Diagnosis not present

## 2023-03-26 DIAGNOSIS — E119 Type 2 diabetes mellitus without complications: Secondary | ICD-10-CM | POA: Diagnosis not present

## 2023-03-26 DIAGNOSIS — E785 Hyperlipidemia, unspecified: Secondary | ICD-10-CM | POA: Diagnosis not present

## 2023-03-26 DIAGNOSIS — K219 Gastro-esophageal reflux disease without esophagitis: Secondary | ICD-10-CM | POA: Diagnosis not present

## 2023-03-26 DIAGNOSIS — C711 Malignant neoplasm of frontal lobe: Secondary | ICD-10-CM | POA: Diagnosis not present

## 2023-03-29 DIAGNOSIS — S42202D Unspecified fracture of upper end of left humerus, subsequent encounter for fracture with routine healing: Secondary | ICD-10-CM | POA: Diagnosis not present

## 2023-03-29 DIAGNOSIS — E785 Hyperlipidemia, unspecified: Secondary | ICD-10-CM | POA: Diagnosis not present

## 2023-03-29 DIAGNOSIS — K219 Gastro-esophageal reflux disease without esophagitis: Secondary | ICD-10-CM | POA: Diagnosis not present

## 2023-03-29 DIAGNOSIS — R4701 Aphasia: Secondary | ICD-10-CM | POA: Diagnosis not present

## 2023-03-29 DIAGNOSIS — C711 Malignant neoplasm of frontal lobe: Secondary | ICD-10-CM | POA: Diagnosis not present

## 2023-03-29 DIAGNOSIS — E119 Type 2 diabetes mellitus without complications: Secondary | ICD-10-CM | POA: Diagnosis not present

## 2023-03-31 DIAGNOSIS — K219 Gastro-esophageal reflux disease without esophagitis: Secondary | ICD-10-CM | POA: Diagnosis not present

## 2023-03-31 DIAGNOSIS — R4701 Aphasia: Secondary | ICD-10-CM | POA: Diagnosis not present

## 2023-03-31 DIAGNOSIS — E119 Type 2 diabetes mellitus without complications: Secondary | ICD-10-CM | POA: Diagnosis not present

## 2023-03-31 DIAGNOSIS — E785 Hyperlipidemia, unspecified: Secondary | ICD-10-CM | POA: Diagnosis not present

## 2023-03-31 DIAGNOSIS — C711 Malignant neoplasm of frontal lobe: Secondary | ICD-10-CM | POA: Diagnosis not present

## 2023-03-31 DIAGNOSIS — S42202D Unspecified fracture of upper end of left humerus, subsequent encounter for fracture with routine healing: Secondary | ICD-10-CM | POA: Diagnosis not present

## 2023-04-02 DIAGNOSIS — E119 Type 2 diabetes mellitus without complications: Secondary | ICD-10-CM | POA: Diagnosis not present

## 2023-04-02 DIAGNOSIS — K219 Gastro-esophageal reflux disease without esophagitis: Secondary | ICD-10-CM | POA: Diagnosis not present

## 2023-04-02 DIAGNOSIS — S42202D Unspecified fracture of upper end of left humerus, subsequent encounter for fracture with routine healing: Secondary | ICD-10-CM | POA: Diagnosis not present

## 2023-04-02 DIAGNOSIS — E785 Hyperlipidemia, unspecified: Secondary | ICD-10-CM | POA: Diagnosis not present

## 2023-04-02 DIAGNOSIS — R4701 Aphasia: Secondary | ICD-10-CM | POA: Diagnosis not present

## 2023-04-02 DIAGNOSIS — C711 Malignant neoplasm of frontal lobe: Secondary | ICD-10-CM | POA: Diagnosis not present

## 2023-04-05 DIAGNOSIS — K219 Gastro-esophageal reflux disease without esophagitis: Secondary | ICD-10-CM | POA: Diagnosis not present

## 2023-04-05 DIAGNOSIS — S42202D Unspecified fracture of upper end of left humerus, subsequent encounter for fracture with routine healing: Secondary | ICD-10-CM | POA: Diagnosis not present

## 2023-04-05 DIAGNOSIS — R4701 Aphasia: Secondary | ICD-10-CM | POA: Diagnosis not present

## 2023-04-05 DIAGNOSIS — E785 Hyperlipidemia, unspecified: Secondary | ICD-10-CM | POA: Diagnosis not present

## 2023-04-05 DIAGNOSIS — C711 Malignant neoplasm of frontal lobe: Secondary | ICD-10-CM | POA: Diagnosis not present

## 2023-04-05 DIAGNOSIS — E119 Type 2 diabetes mellitus without complications: Secondary | ICD-10-CM | POA: Diagnosis not present

## 2023-04-07 DIAGNOSIS — S42202D Unspecified fracture of upper end of left humerus, subsequent encounter for fracture with routine healing: Secondary | ICD-10-CM | POA: Diagnosis not present

## 2023-04-07 DIAGNOSIS — R4701 Aphasia: Secondary | ICD-10-CM | POA: Diagnosis not present

## 2023-04-07 DIAGNOSIS — K219 Gastro-esophageal reflux disease without esophagitis: Secondary | ICD-10-CM | POA: Diagnosis not present

## 2023-04-07 DIAGNOSIS — E785 Hyperlipidemia, unspecified: Secondary | ICD-10-CM | POA: Diagnosis not present

## 2023-04-07 DIAGNOSIS — C711 Malignant neoplasm of frontal lobe: Secondary | ICD-10-CM | POA: Diagnosis not present

## 2023-04-07 DIAGNOSIS — E119 Type 2 diabetes mellitus without complications: Secondary | ICD-10-CM | POA: Diagnosis not present

## 2023-04-08 DIAGNOSIS — R4701 Aphasia: Secondary | ICD-10-CM | POA: Diagnosis not present

## 2023-04-08 DIAGNOSIS — K219 Gastro-esophageal reflux disease without esophagitis: Secondary | ICD-10-CM | POA: Diagnosis not present

## 2023-04-08 DIAGNOSIS — S42202D Unspecified fracture of upper end of left humerus, subsequent encounter for fracture with routine healing: Secondary | ICD-10-CM | POA: Diagnosis not present

## 2023-04-08 DIAGNOSIS — E119 Type 2 diabetes mellitus without complications: Secondary | ICD-10-CM | POA: Diagnosis not present

## 2023-04-08 DIAGNOSIS — E785 Hyperlipidemia, unspecified: Secondary | ICD-10-CM | POA: Diagnosis not present

## 2023-04-08 DIAGNOSIS — C711 Malignant neoplasm of frontal lobe: Secondary | ICD-10-CM | POA: Diagnosis not present

## 2023-04-09 DIAGNOSIS — E119 Type 2 diabetes mellitus without complications: Secondary | ICD-10-CM | POA: Diagnosis not present

## 2023-04-09 DIAGNOSIS — R4701 Aphasia: Secondary | ICD-10-CM | POA: Diagnosis not present

## 2023-04-09 DIAGNOSIS — E785 Hyperlipidemia, unspecified: Secondary | ICD-10-CM | POA: Diagnosis not present

## 2023-04-09 DIAGNOSIS — K219 Gastro-esophageal reflux disease without esophagitis: Secondary | ICD-10-CM | POA: Diagnosis not present

## 2023-04-09 DIAGNOSIS — S42202D Unspecified fracture of upper end of left humerus, subsequent encounter for fracture with routine healing: Secondary | ICD-10-CM | POA: Diagnosis not present

## 2023-04-09 DIAGNOSIS — C711 Malignant neoplasm of frontal lobe: Secondary | ICD-10-CM | POA: Diagnosis not present

## 2023-04-12 DIAGNOSIS — C711 Malignant neoplasm of frontal lobe: Secondary | ICD-10-CM | POA: Diagnosis not present

## 2023-04-12 DIAGNOSIS — E119 Type 2 diabetes mellitus without complications: Secondary | ICD-10-CM | POA: Diagnosis not present

## 2023-04-12 DIAGNOSIS — K219 Gastro-esophageal reflux disease without esophagitis: Secondary | ICD-10-CM | POA: Diagnosis not present

## 2023-04-12 DIAGNOSIS — S42202D Unspecified fracture of upper end of left humerus, subsequent encounter for fracture with routine healing: Secondary | ICD-10-CM | POA: Diagnosis not present

## 2023-04-12 DIAGNOSIS — R4701 Aphasia: Secondary | ICD-10-CM | POA: Diagnosis not present

## 2023-04-12 DIAGNOSIS — E785 Hyperlipidemia, unspecified: Secondary | ICD-10-CM | POA: Diagnosis not present

## 2023-04-14 DIAGNOSIS — S42202D Unspecified fracture of upper end of left humerus, subsequent encounter for fracture with routine healing: Secondary | ICD-10-CM | POA: Diagnosis not present

## 2023-04-14 DIAGNOSIS — C711 Malignant neoplasm of frontal lobe: Secondary | ICD-10-CM | POA: Diagnosis not present

## 2023-04-14 DIAGNOSIS — R4701 Aphasia: Secondary | ICD-10-CM | POA: Diagnosis not present

## 2023-04-14 DIAGNOSIS — K219 Gastro-esophageal reflux disease without esophagitis: Secondary | ICD-10-CM | POA: Diagnosis not present

## 2023-04-14 DIAGNOSIS — E119 Type 2 diabetes mellitus without complications: Secondary | ICD-10-CM | POA: Diagnosis not present

## 2023-04-14 DIAGNOSIS — E785 Hyperlipidemia, unspecified: Secondary | ICD-10-CM | POA: Diagnosis not present

## 2023-04-16 DIAGNOSIS — K219 Gastro-esophageal reflux disease without esophagitis: Secondary | ICD-10-CM | POA: Diagnosis not present

## 2023-04-16 DIAGNOSIS — E119 Type 2 diabetes mellitus without complications: Secondary | ICD-10-CM | POA: Diagnosis not present

## 2023-04-16 DIAGNOSIS — S42202D Unspecified fracture of upper end of left humerus, subsequent encounter for fracture with routine healing: Secondary | ICD-10-CM | POA: Diagnosis not present

## 2023-04-16 DIAGNOSIS — R4701 Aphasia: Secondary | ICD-10-CM | POA: Diagnosis not present

## 2023-04-16 DIAGNOSIS — E785 Hyperlipidemia, unspecified: Secondary | ICD-10-CM | POA: Diagnosis not present

## 2023-04-16 DIAGNOSIS — C711 Malignant neoplasm of frontal lobe: Secondary | ICD-10-CM | POA: Diagnosis not present

## 2023-04-19 DIAGNOSIS — K219 Gastro-esophageal reflux disease without esophagitis: Secondary | ICD-10-CM | POA: Diagnosis not present

## 2023-04-19 DIAGNOSIS — R4701 Aphasia: Secondary | ICD-10-CM | POA: Diagnosis not present

## 2023-04-19 DIAGNOSIS — S42202D Unspecified fracture of upper end of left humerus, subsequent encounter for fracture with routine healing: Secondary | ICD-10-CM | POA: Diagnosis not present

## 2023-04-19 DIAGNOSIS — C711 Malignant neoplasm of frontal lobe: Secondary | ICD-10-CM | POA: Diagnosis not present

## 2023-04-19 DIAGNOSIS — E785 Hyperlipidemia, unspecified: Secondary | ICD-10-CM | POA: Diagnosis not present

## 2023-04-19 DIAGNOSIS — E119 Type 2 diabetes mellitus without complications: Secondary | ICD-10-CM | POA: Diagnosis not present

## 2023-04-21 DIAGNOSIS — E119 Type 2 diabetes mellitus without complications: Secondary | ICD-10-CM | POA: Diagnosis not present

## 2023-04-21 DIAGNOSIS — E785 Hyperlipidemia, unspecified: Secondary | ICD-10-CM | POA: Diagnosis not present

## 2023-04-21 DIAGNOSIS — R4701 Aphasia: Secondary | ICD-10-CM | POA: Diagnosis not present

## 2023-04-21 DIAGNOSIS — C711 Malignant neoplasm of frontal lobe: Secondary | ICD-10-CM | POA: Diagnosis not present

## 2023-04-21 DIAGNOSIS — K219 Gastro-esophageal reflux disease without esophagitis: Secondary | ICD-10-CM | POA: Diagnosis not present

## 2023-04-21 DIAGNOSIS — S42202D Unspecified fracture of upper end of left humerus, subsequent encounter for fracture with routine healing: Secondary | ICD-10-CM | POA: Diagnosis not present

## 2023-04-23 DIAGNOSIS — K219 Gastro-esophageal reflux disease without esophagitis: Secondary | ICD-10-CM | POA: Diagnosis not present

## 2023-04-23 DIAGNOSIS — C711 Malignant neoplasm of frontal lobe: Secondary | ICD-10-CM | POA: Diagnosis not present

## 2023-04-23 DIAGNOSIS — E785 Hyperlipidemia, unspecified: Secondary | ICD-10-CM | POA: Diagnosis not present

## 2023-04-23 DIAGNOSIS — S42202D Unspecified fracture of upper end of left humerus, subsequent encounter for fracture with routine healing: Secondary | ICD-10-CM | POA: Diagnosis not present

## 2023-04-23 DIAGNOSIS — E119 Type 2 diabetes mellitus without complications: Secondary | ICD-10-CM | POA: Diagnosis not present

## 2023-04-23 DIAGNOSIS — R4701 Aphasia: Secondary | ICD-10-CM | POA: Diagnosis not present

## 2023-04-24 DIAGNOSIS — K219 Gastro-esophageal reflux disease without esophagitis: Secondary | ICD-10-CM | POA: Diagnosis not present

## 2023-04-24 DIAGNOSIS — R32 Unspecified urinary incontinence: Secondary | ICD-10-CM | POA: Diagnosis not present

## 2023-04-24 DIAGNOSIS — G40909 Epilepsy, unspecified, not intractable, without status epilepticus: Secondary | ICD-10-CM | POA: Diagnosis not present

## 2023-04-24 DIAGNOSIS — C711 Malignant neoplasm of frontal lobe: Secondary | ICD-10-CM | POA: Diagnosis not present

## 2023-04-24 DIAGNOSIS — G8194 Hemiplegia, unspecified affecting left nondominant side: Secondary | ICD-10-CM | POA: Diagnosis not present

## 2023-04-24 DIAGNOSIS — S01112D Laceration without foreign body of left eyelid and periocular area, subsequent encounter: Secondary | ICD-10-CM | POA: Diagnosis not present

## 2023-04-24 DIAGNOSIS — I10 Essential (primary) hypertension: Secondary | ICD-10-CM | POA: Diagnosis not present

## 2023-04-24 DIAGNOSIS — R296 Repeated falls: Secondary | ICD-10-CM | POA: Diagnosis not present

## 2023-04-24 DIAGNOSIS — E785 Hyperlipidemia, unspecified: Secondary | ICD-10-CM | POA: Diagnosis not present

## 2023-04-24 DIAGNOSIS — E119 Type 2 diabetes mellitus without complications: Secondary | ICD-10-CM | POA: Diagnosis not present

## 2023-04-24 DIAGNOSIS — R4701 Aphasia: Secondary | ICD-10-CM | POA: Diagnosis not present

## 2023-04-24 DIAGNOSIS — S42202D Unspecified fracture of upper end of left humerus, subsequent encounter for fracture with routine healing: Secondary | ICD-10-CM | POA: Diagnosis not present

## 2023-04-26 DIAGNOSIS — E119 Type 2 diabetes mellitus without complications: Secondary | ICD-10-CM | POA: Diagnosis not present

## 2023-04-26 DIAGNOSIS — E785 Hyperlipidemia, unspecified: Secondary | ICD-10-CM | POA: Diagnosis not present

## 2023-04-26 DIAGNOSIS — K219 Gastro-esophageal reflux disease without esophagitis: Secondary | ICD-10-CM | POA: Diagnosis not present

## 2023-04-26 DIAGNOSIS — R4701 Aphasia: Secondary | ICD-10-CM | POA: Diagnosis not present

## 2023-04-26 DIAGNOSIS — C711 Malignant neoplasm of frontal lobe: Secondary | ICD-10-CM | POA: Diagnosis not present

## 2023-04-26 DIAGNOSIS — S42202D Unspecified fracture of upper end of left humerus, subsequent encounter for fracture with routine healing: Secondary | ICD-10-CM | POA: Diagnosis not present

## 2023-04-28 DIAGNOSIS — C711 Malignant neoplasm of frontal lobe: Secondary | ICD-10-CM | POA: Diagnosis not present

## 2023-04-28 DIAGNOSIS — K219 Gastro-esophageal reflux disease without esophagitis: Secondary | ICD-10-CM | POA: Diagnosis not present

## 2023-04-28 DIAGNOSIS — S42202D Unspecified fracture of upper end of left humerus, subsequent encounter for fracture with routine healing: Secondary | ICD-10-CM | POA: Diagnosis not present

## 2023-04-28 DIAGNOSIS — R4701 Aphasia: Secondary | ICD-10-CM | POA: Diagnosis not present

## 2023-04-28 DIAGNOSIS — E785 Hyperlipidemia, unspecified: Secondary | ICD-10-CM | POA: Diagnosis not present

## 2023-04-28 DIAGNOSIS — E119 Type 2 diabetes mellitus without complications: Secondary | ICD-10-CM | POA: Diagnosis not present

## 2023-04-30 DIAGNOSIS — K219 Gastro-esophageal reflux disease without esophagitis: Secondary | ICD-10-CM | POA: Diagnosis not present

## 2023-04-30 DIAGNOSIS — E785 Hyperlipidemia, unspecified: Secondary | ICD-10-CM | POA: Diagnosis not present

## 2023-04-30 DIAGNOSIS — S42202D Unspecified fracture of upper end of left humerus, subsequent encounter for fracture with routine healing: Secondary | ICD-10-CM | POA: Diagnosis not present

## 2023-04-30 DIAGNOSIS — E119 Type 2 diabetes mellitus without complications: Secondary | ICD-10-CM | POA: Diagnosis not present

## 2023-04-30 DIAGNOSIS — R4701 Aphasia: Secondary | ICD-10-CM | POA: Diagnosis not present

## 2023-04-30 DIAGNOSIS — C711 Malignant neoplasm of frontal lobe: Secondary | ICD-10-CM | POA: Diagnosis not present

## 2023-05-03 DIAGNOSIS — S42202D Unspecified fracture of upper end of left humerus, subsequent encounter for fracture with routine healing: Secondary | ICD-10-CM | POA: Diagnosis not present

## 2023-05-03 DIAGNOSIS — E119 Type 2 diabetes mellitus without complications: Secondary | ICD-10-CM | POA: Diagnosis not present

## 2023-05-03 DIAGNOSIS — R4701 Aphasia: Secondary | ICD-10-CM | POA: Diagnosis not present

## 2023-05-03 DIAGNOSIS — E785 Hyperlipidemia, unspecified: Secondary | ICD-10-CM | POA: Diagnosis not present

## 2023-05-03 DIAGNOSIS — C711 Malignant neoplasm of frontal lobe: Secondary | ICD-10-CM | POA: Diagnosis not present

## 2023-05-03 DIAGNOSIS — K219 Gastro-esophageal reflux disease without esophagitis: Secondary | ICD-10-CM | POA: Diagnosis not present

## 2023-05-05 DIAGNOSIS — E785 Hyperlipidemia, unspecified: Secondary | ICD-10-CM | POA: Diagnosis not present

## 2023-05-05 DIAGNOSIS — R4701 Aphasia: Secondary | ICD-10-CM | POA: Diagnosis not present

## 2023-05-05 DIAGNOSIS — C711 Malignant neoplasm of frontal lobe: Secondary | ICD-10-CM | POA: Diagnosis not present

## 2023-05-05 DIAGNOSIS — S42202D Unspecified fracture of upper end of left humerus, subsequent encounter for fracture with routine healing: Secondary | ICD-10-CM | POA: Diagnosis not present

## 2023-05-05 DIAGNOSIS — E119 Type 2 diabetes mellitus without complications: Secondary | ICD-10-CM | POA: Diagnosis not present

## 2023-05-05 DIAGNOSIS — K219 Gastro-esophageal reflux disease without esophagitis: Secondary | ICD-10-CM | POA: Diagnosis not present

## 2023-05-07 DIAGNOSIS — E119 Type 2 diabetes mellitus without complications: Secondary | ICD-10-CM | POA: Diagnosis not present

## 2023-05-07 DIAGNOSIS — S42202D Unspecified fracture of upper end of left humerus, subsequent encounter for fracture with routine healing: Secondary | ICD-10-CM | POA: Diagnosis not present

## 2023-05-07 DIAGNOSIS — R4701 Aphasia: Secondary | ICD-10-CM | POA: Diagnosis not present

## 2023-05-07 DIAGNOSIS — E785 Hyperlipidemia, unspecified: Secondary | ICD-10-CM | POA: Diagnosis not present

## 2023-05-07 DIAGNOSIS — K219 Gastro-esophageal reflux disease without esophagitis: Secondary | ICD-10-CM | POA: Diagnosis not present

## 2023-05-07 DIAGNOSIS — C711 Malignant neoplasm of frontal lobe: Secondary | ICD-10-CM | POA: Diagnosis not present

## 2023-05-10 DIAGNOSIS — K219 Gastro-esophageal reflux disease without esophagitis: Secondary | ICD-10-CM | POA: Diagnosis not present

## 2023-05-10 DIAGNOSIS — C711 Malignant neoplasm of frontal lobe: Secondary | ICD-10-CM | POA: Diagnosis not present

## 2023-05-10 DIAGNOSIS — E119 Type 2 diabetes mellitus without complications: Secondary | ICD-10-CM | POA: Diagnosis not present

## 2023-05-10 DIAGNOSIS — R4701 Aphasia: Secondary | ICD-10-CM | POA: Diagnosis not present

## 2023-05-10 DIAGNOSIS — S42202D Unspecified fracture of upper end of left humerus, subsequent encounter for fracture with routine healing: Secondary | ICD-10-CM | POA: Diagnosis not present

## 2023-05-10 DIAGNOSIS — E785 Hyperlipidemia, unspecified: Secondary | ICD-10-CM | POA: Diagnosis not present

## 2023-05-12 DIAGNOSIS — C711 Malignant neoplasm of frontal lobe: Secondary | ICD-10-CM | POA: Diagnosis not present

## 2023-05-12 DIAGNOSIS — S42202D Unspecified fracture of upper end of left humerus, subsequent encounter for fracture with routine healing: Secondary | ICD-10-CM | POA: Diagnosis not present

## 2023-05-12 DIAGNOSIS — E119 Type 2 diabetes mellitus without complications: Secondary | ICD-10-CM | POA: Diagnosis not present

## 2023-05-12 DIAGNOSIS — K219 Gastro-esophageal reflux disease without esophagitis: Secondary | ICD-10-CM | POA: Diagnosis not present

## 2023-05-12 DIAGNOSIS — R4701 Aphasia: Secondary | ICD-10-CM | POA: Diagnosis not present

## 2023-05-12 DIAGNOSIS — E785 Hyperlipidemia, unspecified: Secondary | ICD-10-CM | POA: Diagnosis not present

## 2023-05-14 DIAGNOSIS — C711 Malignant neoplasm of frontal lobe: Secondary | ICD-10-CM | POA: Diagnosis not present

## 2023-05-14 DIAGNOSIS — E785 Hyperlipidemia, unspecified: Secondary | ICD-10-CM | POA: Diagnosis not present

## 2023-05-14 DIAGNOSIS — E119 Type 2 diabetes mellitus without complications: Secondary | ICD-10-CM | POA: Diagnosis not present

## 2023-05-14 DIAGNOSIS — K219 Gastro-esophageal reflux disease without esophagitis: Secondary | ICD-10-CM | POA: Diagnosis not present

## 2023-05-14 DIAGNOSIS — R4701 Aphasia: Secondary | ICD-10-CM | POA: Diagnosis not present

## 2023-05-14 DIAGNOSIS — S42202D Unspecified fracture of upper end of left humerus, subsequent encounter for fracture with routine healing: Secondary | ICD-10-CM | POA: Diagnosis not present

## 2023-05-17 DIAGNOSIS — C711 Malignant neoplasm of frontal lobe: Secondary | ICD-10-CM | POA: Diagnosis not present

## 2023-05-17 DIAGNOSIS — E785 Hyperlipidemia, unspecified: Secondary | ICD-10-CM | POA: Diagnosis not present

## 2023-05-17 DIAGNOSIS — E119 Type 2 diabetes mellitus without complications: Secondary | ICD-10-CM | POA: Diagnosis not present

## 2023-05-17 DIAGNOSIS — R4701 Aphasia: Secondary | ICD-10-CM | POA: Diagnosis not present

## 2023-05-17 DIAGNOSIS — K219 Gastro-esophageal reflux disease without esophagitis: Secondary | ICD-10-CM | POA: Diagnosis not present

## 2023-05-17 DIAGNOSIS — S42202D Unspecified fracture of upper end of left humerus, subsequent encounter for fracture with routine healing: Secondary | ICD-10-CM | POA: Diagnosis not present

## 2023-05-19 DIAGNOSIS — S42202D Unspecified fracture of upper end of left humerus, subsequent encounter for fracture with routine healing: Secondary | ICD-10-CM | POA: Diagnosis not present

## 2023-05-19 DIAGNOSIS — R4701 Aphasia: Secondary | ICD-10-CM | POA: Diagnosis not present

## 2023-05-19 DIAGNOSIS — C711 Malignant neoplasm of frontal lobe: Secondary | ICD-10-CM | POA: Diagnosis not present

## 2023-05-19 DIAGNOSIS — E119 Type 2 diabetes mellitus without complications: Secondary | ICD-10-CM | POA: Diagnosis not present

## 2023-05-19 DIAGNOSIS — E785 Hyperlipidemia, unspecified: Secondary | ICD-10-CM | POA: Diagnosis not present

## 2023-05-19 DIAGNOSIS — K219 Gastro-esophageal reflux disease without esophagitis: Secondary | ICD-10-CM | POA: Diagnosis not present

## 2023-05-21 DIAGNOSIS — S42202D Unspecified fracture of upper end of left humerus, subsequent encounter for fracture with routine healing: Secondary | ICD-10-CM | POA: Diagnosis not present

## 2023-05-21 DIAGNOSIS — E785 Hyperlipidemia, unspecified: Secondary | ICD-10-CM | POA: Diagnosis not present

## 2023-05-21 DIAGNOSIS — R4701 Aphasia: Secondary | ICD-10-CM | POA: Diagnosis not present

## 2023-05-21 DIAGNOSIS — E119 Type 2 diabetes mellitus without complications: Secondary | ICD-10-CM | POA: Diagnosis not present

## 2023-05-21 DIAGNOSIS — C711 Malignant neoplasm of frontal lobe: Secondary | ICD-10-CM | POA: Diagnosis not present

## 2023-05-21 DIAGNOSIS — K219 Gastro-esophageal reflux disease without esophagitis: Secondary | ICD-10-CM | POA: Diagnosis not present

## 2023-05-22 DIAGNOSIS — R4701 Aphasia: Secondary | ICD-10-CM | POA: Diagnosis not present

## 2023-05-22 DIAGNOSIS — R296 Repeated falls: Secondary | ICD-10-CM | POA: Diagnosis not present

## 2023-05-22 DIAGNOSIS — R32 Unspecified urinary incontinence: Secondary | ICD-10-CM | POA: Diagnosis not present

## 2023-05-22 DIAGNOSIS — S42202D Unspecified fracture of upper end of left humerus, subsequent encounter for fracture with routine healing: Secondary | ICD-10-CM | POA: Diagnosis not present

## 2023-05-22 DIAGNOSIS — I10 Essential (primary) hypertension: Secondary | ICD-10-CM | POA: Diagnosis not present

## 2023-05-22 DIAGNOSIS — E785 Hyperlipidemia, unspecified: Secondary | ICD-10-CM | POA: Diagnosis not present

## 2023-05-22 DIAGNOSIS — K219 Gastro-esophageal reflux disease without esophagitis: Secondary | ICD-10-CM | POA: Diagnosis not present

## 2023-05-22 DIAGNOSIS — E119 Type 2 diabetes mellitus without complications: Secondary | ICD-10-CM | POA: Diagnosis not present

## 2023-05-22 DIAGNOSIS — S01112D Laceration without foreign body of left eyelid and periocular area, subsequent encounter: Secondary | ICD-10-CM | POA: Diagnosis not present

## 2023-05-22 DIAGNOSIS — G8194 Hemiplegia, unspecified affecting left nondominant side: Secondary | ICD-10-CM | POA: Diagnosis not present

## 2023-05-22 DIAGNOSIS — G40909 Epilepsy, unspecified, not intractable, without status epilepticus: Secondary | ICD-10-CM | POA: Diagnosis not present

## 2023-05-22 DIAGNOSIS — C711 Malignant neoplasm of frontal lobe: Secondary | ICD-10-CM | POA: Diagnosis not present

## 2023-05-24 DIAGNOSIS — E785 Hyperlipidemia, unspecified: Secondary | ICD-10-CM | POA: Diagnosis not present

## 2023-05-24 DIAGNOSIS — C711 Malignant neoplasm of frontal lobe: Secondary | ICD-10-CM | POA: Diagnosis not present

## 2023-05-24 DIAGNOSIS — K219 Gastro-esophageal reflux disease without esophagitis: Secondary | ICD-10-CM | POA: Diagnosis not present

## 2023-05-24 DIAGNOSIS — E119 Type 2 diabetes mellitus without complications: Secondary | ICD-10-CM | POA: Diagnosis not present

## 2023-05-24 DIAGNOSIS — R4701 Aphasia: Secondary | ICD-10-CM | POA: Diagnosis not present

## 2023-05-24 DIAGNOSIS — S42202D Unspecified fracture of upper end of left humerus, subsequent encounter for fracture with routine healing: Secondary | ICD-10-CM | POA: Diagnosis not present

## 2023-05-26 DIAGNOSIS — C711 Malignant neoplasm of frontal lobe: Secondary | ICD-10-CM | POA: Diagnosis not present

## 2023-05-26 DIAGNOSIS — S42202D Unspecified fracture of upper end of left humerus, subsequent encounter for fracture with routine healing: Secondary | ICD-10-CM | POA: Diagnosis not present

## 2023-05-26 DIAGNOSIS — E785 Hyperlipidemia, unspecified: Secondary | ICD-10-CM | POA: Diagnosis not present

## 2023-05-26 DIAGNOSIS — R4701 Aphasia: Secondary | ICD-10-CM | POA: Diagnosis not present

## 2023-05-26 DIAGNOSIS — K219 Gastro-esophageal reflux disease without esophagitis: Secondary | ICD-10-CM | POA: Diagnosis not present

## 2023-05-26 DIAGNOSIS — E119 Type 2 diabetes mellitus without complications: Secondary | ICD-10-CM | POA: Diagnosis not present

## 2023-05-28 DIAGNOSIS — R4701 Aphasia: Secondary | ICD-10-CM | POA: Diagnosis not present

## 2023-05-28 DIAGNOSIS — S42202D Unspecified fracture of upper end of left humerus, subsequent encounter for fracture with routine healing: Secondary | ICD-10-CM | POA: Diagnosis not present

## 2023-05-28 DIAGNOSIS — C711 Malignant neoplasm of frontal lobe: Secondary | ICD-10-CM | POA: Diagnosis not present

## 2023-05-28 DIAGNOSIS — E119 Type 2 diabetes mellitus without complications: Secondary | ICD-10-CM | POA: Diagnosis not present

## 2023-05-28 DIAGNOSIS — K219 Gastro-esophageal reflux disease without esophagitis: Secondary | ICD-10-CM | POA: Diagnosis not present

## 2023-05-28 DIAGNOSIS — E785 Hyperlipidemia, unspecified: Secondary | ICD-10-CM | POA: Diagnosis not present

## 2023-05-31 DIAGNOSIS — K219 Gastro-esophageal reflux disease without esophagitis: Secondary | ICD-10-CM | POA: Diagnosis not present

## 2023-05-31 DIAGNOSIS — S42202D Unspecified fracture of upper end of left humerus, subsequent encounter for fracture with routine healing: Secondary | ICD-10-CM | POA: Diagnosis not present

## 2023-05-31 DIAGNOSIS — E785 Hyperlipidemia, unspecified: Secondary | ICD-10-CM | POA: Diagnosis not present

## 2023-05-31 DIAGNOSIS — E119 Type 2 diabetes mellitus without complications: Secondary | ICD-10-CM | POA: Diagnosis not present

## 2023-05-31 DIAGNOSIS — C711 Malignant neoplasm of frontal lobe: Secondary | ICD-10-CM | POA: Diagnosis not present

## 2023-05-31 DIAGNOSIS — R4701 Aphasia: Secondary | ICD-10-CM | POA: Diagnosis not present

## 2023-06-01 DIAGNOSIS — R4701 Aphasia: Secondary | ICD-10-CM | POA: Diagnosis not present

## 2023-06-01 DIAGNOSIS — K219 Gastro-esophageal reflux disease without esophagitis: Secondary | ICD-10-CM | POA: Diagnosis not present

## 2023-06-01 DIAGNOSIS — E119 Type 2 diabetes mellitus without complications: Secondary | ICD-10-CM | POA: Diagnosis not present

## 2023-06-01 DIAGNOSIS — S42202D Unspecified fracture of upper end of left humerus, subsequent encounter for fracture with routine healing: Secondary | ICD-10-CM | POA: Diagnosis not present

## 2023-06-01 DIAGNOSIS — C711 Malignant neoplasm of frontal lobe: Secondary | ICD-10-CM | POA: Diagnosis not present

## 2023-06-01 DIAGNOSIS — E785 Hyperlipidemia, unspecified: Secondary | ICD-10-CM | POA: Diagnosis not present

## 2023-06-02 DIAGNOSIS — K219 Gastro-esophageal reflux disease without esophagitis: Secondary | ICD-10-CM | POA: Diagnosis not present

## 2023-06-02 DIAGNOSIS — R4701 Aphasia: Secondary | ICD-10-CM | POA: Diagnosis not present

## 2023-06-02 DIAGNOSIS — S42202D Unspecified fracture of upper end of left humerus, subsequent encounter for fracture with routine healing: Secondary | ICD-10-CM | POA: Diagnosis not present

## 2023-06-02 DIAGNOSIS — C711 Malignant neoplasm of frontal lobe: Secondary | ICD-10-CM | POA: Diagnosis not present

## 2023-06-02 DIAGNOSIS — E119 Type 2 diabetes mellitus without complications: Secondary | ICD-10-CM | POA: Diagnosis not present

## 2023-06-02 DIAGNOSIS — E785 Hyperlipidemia, unspecified: Secondary | ICD-10-CM | POA: Diagnosis not present

## 2023-06-04 DIAGNOSIS — C711 Malignant neoplasm of frontal lobe: Secondary | ICD-10-CM | POA: Diagnosis not present

## 2023-06-04 DIAGNOSIS — S42202D Unspecified fracture of upper end of left humerus, subsequent encounter for fracture with routine healing: Secondary | ICD-10-CM | POA: Diagnosis not present

## 2023-06-04 DIAGNOSIS — E119 Type 2 diabetes mellitus without complications: Secondary | ICD-10-CM | POA: Diagnosis not present

## 2023-06-04 DIAGNOSIS — K219 Gastro-esophageal reflux disease without esophagitis: Secondary | ICD-10-CM | POA: Diagnosis not present

## 2023-06-04 DIAGNOSIS — E785 Hyperlipidemia, unspecified: Secondary | ICD-10-CM | POA: Diagnosis not present

## 2023-06-04 DIAGNOSIS — R4701 Aphasia: Secondary | ICD-10-CM | POA: Diagnosis not present

## 2023-06-07 DIAGNOSIS — E785 Hyperlipidemia, unspecified: Secondary | ICD-10-CM | POA: Diagnosis not present

## 2023-06-07 DIAGNOSIS — E119 Type 2 diabetes mellitus without complications: Secondary | ICD-10-CM | POA: Diagnosis not present

## 2023-06-07 DIAGNOSIS — S42202D Unspecified fracture of upper end of left humerus, subsequent encounter for fracture with routine healing: Secondary | ICD-10-CM | POA: Diagnosis not present

## 2023-06-07 DIAGNOSIS — K219 Gastro-esophageal reflux disease without esophagitis: Secondary | ICD-10-CM | POA: Diagnosis not present

## 2023-06-07 DIAGNOSIS — R4701 Aphasia: Secondary | ICD-10-CM | POA: Diagnosis not present

## 2023-06-07 DIAGNOSIS — C711 Malignant neoplasm of frontal lobe: Secondary | ICD-10-CM | POA: Diagnosis not present

## 2023-06-09 DIAGNOSIS — E119 Type 2 diabetes mellitus without complications: Secondary | ICD-10-CM | POA: Diagnosis not present

## 2023-06-09 DIAGNOSIS — E785 Hyperlipidemia, unspecified: Secondary | ICD-10-CM | POA: Diagnosis not present

## 2023-06-09 DIAGNOSIS — C711 Malignant neoplasm of frontal lobe: Secondary | ICD-10-CM | POA: Diagnosis not present

## 2023-06-09 DIAGNOSIS — R4701 Aphasia: Secondary | ICD-10-CM | POA: Diagnosis not present

## 2023-06-09 DIAGNOSIS — K219 Gastro-esophageal reflux disease without esophagitis: Secondary | ICD-10-CM | POA: Diagnosis not present

## 2023-06-09 DIAGNOSIS — S42202D Unspecified fracture of upper end of left humerus, subsequent encounter for fracture with routine healing: Secondary | ICD-10-CM | POA: Diagnosis not present

## 2023-06-11 DIAGNOSIS — S42202D Unspecified fracture of upper end of left humerus, subsequent encounter for fracture with routine healing: Secondary | ICD-10-CM | POA: Diagnosis not present

## 2023-06-11 DIAGNOSIS — E785 Hyperlipidemia, unspecified: Secondary | ICD-10-CM | POA: Diagnosis not present

## 2023-06-11 DIAGNOSIS — R4701 Aphasia: Secondary | ICD-10-CM | POA: Diagnosis not present

## 2023-06-11 DIAGNOSIS — C711 Malignant neoplasm of frontal lobe: Secondary | ICD-10-CM | POA: Diagnosis not present

## 2023-06-11 DIAGNOSIS — K219 Gastro-esophageal reflux disease without esophagitis: Secondary | ICD-10-CM | POA: Diagnosis not present

## 2023-06-11 DIAGNOSIS — E119 Type 2 diabetes mellitus without complications: Secondary | ICD-10-CM | POA: Diagnosis not present

## 2023-06-13 DIAGNOSIS — K219 Gastro-esophageal reflux disease without esophagitis: Secondary | ICD-10-CM | POA: Diagnosis not present

## 2023-06-13 DIAGNOSIS — C711 Malignant neoplasm of frontal lobe: Secondary | ICD-10-CM | POA: Diagnosis not present

## 2023-06-13 DIAGNOSIS — R4701 Aphasia: Secondary | ICD-10-CM | POA: Diagnosis not present

## 2023-06-13 DIAGNOSIS — E119 Type 2 diabetes mellitus without complications: Secondary | ICD-10-CM | POA: Diagnosis not present

## 2023-06-13 DIAGNOSIS — S42202D Unspecified fracture of upper end of left humerus, subsequent encounter for fracture with routine healing: Secondary | ICD-10-CM | POA: Diagnosis not present

## 2023-06-13 DIAGNOSIS — E785 Hyperlipidemia, unspecified: Secondary | ICD-10-CM | POA: Diagnosis not present

## 2023-06-14 DIAGNOSIS — R404 Transient alteration of awareness: Secondary | ICD-10-CM | POA: Diagnosis not present

## 2023-06-14 DIAGNOSIS — S42202D Unspecified fracture of upper end of left humerus, subsequent encounter for fracture with routine healing: Secondary | ICD-10-CM | POA: Diagnosis not present

## 2023-06-14 DIAGNOSIS — K219 Gastro-esophageal reflux disease without esophagitis: Secondary | ICD-10-CM | POA: Diagnosis not present

## 2023-06-14 DIAGNOSIS — E785 Hyperlipidemia, unspecified: Secondary | ICD-10-CM | POA: Diagnosis not present

## 2023-06-14 DIAGNOSIS — E119 Type 2 diabetes mellitus without complications: Secondary | ICD-10-CM | POA: Diagnosis not present

## 2023-06-14 DIAGNOSIS — R4701 Aphasia: Secondary | ICD-10-CM | POA: Diagnosis not present

## 2023-06-14 DIAGNOSIS — C711 Malignant neoplasm of frontal lobe: Secondary | ICD-10-CM | POA: Diagnosis not present

## 2023-06-15 ENCOUNTER — Telehealth: Payer: Self-pay | Admitting: *Deleted

## 2023-06-15 DIAGNOSIS — R4701 Aphasia: Secondary | ICD-10-CM | POA: Diagnosis not present

## 2023-06-15 DIAGNOSIS — E119 Type 2 diabetes mellitus without complications: Secondary | ICD-10-CM | POA: Diagnosis not present

## 2023-06-15 DIAGNOSIS — S42202D Unspecified fracture of upper end of left humerus, subsequent encounter for fracture with routine healing: Secondary | ICD-10-CM | POA: Diagnosis not present

## 2023-06-15 DIAGNOSIS — E785 Hyperlipidemia, unspecified: Secondary | ICD-10-CM | POA: Diagnosis not present

## 2023-06-15 DIAGNOSIS — C711 Malignant neoplasm of frontal lobe: Secondary | ICD-10-CM | POA: Diagnosis not present

## 2023-06-15 DIAGNOSIS — K219 Gastro-esophageal reflux disease without esophagitis: Secondary | ICD-10-CM | POA: Diagnosis not present

## 2023-06-15 NOTE — Telephone Encounter (Signed)
 Hospice physician orders signed by Dr Barbaraann Cao & faxed to [facility], [phone].  Fax confirmation received.

## 2023-06-16 DIAGNOSIS — S42202D Unspecified fracture of upper end of left humerus, subsequent encounter for fracture with routine healing: Secondary | ICD-10-CM | POA: Diagnosis not present

## 2023-06-16 DIAGNOSIS — E119 Type 2 diabetes mellitus without complications: Secondary | ICD-10-CM | POA: Diagnosis not present

## 2023-06-16 DIAGNOSIS — C711 Malignant neoplasm of frontal lobe: Secondary | ICD-10-CM | POA: Diagnosis not present

## 2023-06-16 DIAGNOSIS — R4701 Aphasia: Secondary | ICD-10-CM | POA: Diagnosis not present

## 2023-06-16 DIAGNOSIS — K219 Gastro-esophageal reflux disease without esophagitis: Secondary | ICD-10-CM | POA: Diagnosis not present

## 2023-06-16 DIAGNOSIS — E785 Hyperlipidemia, unspecified: Secondary | ICD-10-CM | POA: Diagnosis not present

## 2023-06-17 DIAGNOSIS — K219 Gastro-esophageal reflux disease without esophagitis: Secondary | ICD-10-CM | POA: Diagnosis not present

## 2023-06-17 DIAGNOSIS — C711 Malignant neoplasm of frontal lobe: Secondary | ICD-10-CM | POA: Diagnosis not present

## 2023-06-17 DIAGNOSIS — E119 Type 2 diabetes mellitus without complications: Secondary | ICD-10-CM | POA: Diagnosis not present

## 2023-06-17 DIAGNOSIS — R4701 Aphasia: Secondary | ICD-10-CM | POA: Diagnosis not present

## 2023-06-17 DIAGNOSIS — S42202D Unspecified fracture of upper end of left humerus, subsequent encounter for fracture with routine healing: Secondary | ICD-10-CM | POA: Diagnosis not present

## 2023-06-17 DIAGNOSIS — E785 Hyperlipidemia, unspecified: Secondary | ICD-10-CM | POA: Diagnosis not present

## 2023-06-18 DIAGNOSIS — S42202D Unspecified fracture of upper end of left humerus, subsequent encounter for fracture with routine healing: Secondary | ICD-10-CM | POA: Diagnosis not present

## 2023-06-18 DIAGNOSIS — R4701 Aphasia: Secondary | ICD-10-CM | POA: Diagnosis not present

## 2023-06-18 DIAGNOSIS — E785 Hyperlipidemia, unspecified: Secondary | ICD-10-CM | POA: Diagnosis not present

## 2023-06-18 DIAGNOSIS — C711 Malignant neoplasm of frontal lobe: Secondary | ICD-10-CM | POA: Diagnosis not present

## 2023-06-18 DIAGNOSIS — E119 Type 2 diabetes mellitus without complications: Secondary | ICD-10-CM | POA: Diagnosis not present

## 2023-06-18 DIAGNOSIS — K219 Gastro-esophageal reflux disease without esophagitis: Secondary | ICD-10-CM | POA: Diagnosis not present

## 2023-06-19 DIAGNOSIS — C711 Malignant neoplasm of frontal lobe: Secondary | ICD-10-CM | POA: Diagnosis not present

## 2023-06-19 DIAGNOSIS — E119 Type 2 diabetes mellitus without complications: Secondary | ICD-10-CM | POA: Diagnosis not present

## 2023-06-19 DIAGNOSIS — R4701 Aphasia: Secondary | ICD-10-CM | POA: Diagnosis not present

## 2023-06-19 DIAGNOSIS — K219 Gastro-esophageal reflux disease without esophagitis: Secondary | ICD-10-CM | POA: Diagnosis not present

## 2023-06-19 DIAGNOSIS — S42202D Unspecified fracture of upper end of left humerus, subsequent encounter for fracture with routine healing: Secondary | ICD-10-CM | POA: Diagnosis not present

## 2023-06-19 DIAGNOSIS — E785 Hyperlipidemia, unspecified: Secondary | ICD-10-CM | POA: Diagnosis not present

## 2023-06-20 DIAGNOSIS — E119 Type 2 diabetes mellitus without complications: Secondary | ICD-10-CM | POA: Diagnosis not present

## 2023-06-20 DIAGNOSIS — S42202D Unspecified fracture of upper end of left humerus, subsequent encounter for fracture with routine healing: Secondary | ICD-10-CM | POA: Diagnosis not present

## 2023-06-20 DIAGNOSIS — C711 Malignant neoplasm of frontal lobe: Secondary | ICD-10-CM | POA: Diagnosis not present

## 2023-06-20 DIAGNOSIS — K219 Gastro-esophageal reflux disease without esophagitis: Secondary | ICD-10-CM | POA: Diagnosis not present

## 2023-06-20 DIAGNOSIS — E785 Hyperlipidemia, unspecified: Secondary | ICD-10-CM | POA: Diagnosis not present

## 2023-06-20 DIAGNOSIS — R4701 Aphasia: Secondary | ICD-10-CM | POA: Diagnosis not present

## 2023-06-21 DIAGNOSIS — S42202D Unspecified fracture of upper end of left humerus, subsequent encounter for fracture with routine healing: Secondary | ICD-10-CM | POA: Diagnosis not present

## 2023-06-21 DIAGNOSIS — E119 Type 2 diabetes mellitus without complications: Secondary | ICD-10-CM | POA: Diagnosis not present

## 2023-06-21 DIAGNOSIS — C711 Malignant neoplasm of frontal lobe: Secondary | ICD-10-CM | POA: Diagnosis not present

## 2023-06-21 DIAGNOSIS — K219 Gastro-esophageal reflux disease without esophagitis: Secondary | ICD-10-CM | POA: Diagnosis not present

## 2023-06-21 DIAGNOSIS — E785 Hyperlipidemia, unspecified: Secondary | ICD-10-CM | POA: Diagnosis not present

## 2023-06-21 DIAGNOSIS — R4701 Aphasia: Secondary | ICD-10-CM | POA: Diagnosis not present

## 2023-06-22 ENCOUNTER — Telehealth: Payer: Self-pay | Admitting: *Deleted

## 2023-06-22 DIAGNOSIS — R32 Unspecified urinary incontinence: Secondary | ICD-10-CM | POA: Diagnosis not present

## 2023-06-22 DIAGNOSIS — S42202D Unspecified fracture of upper end of left humerus, subsequent encounter for fracture with routine healing: Secondary | ICD-10-CM | POA: Diagnosis not present

## 2023-06-22 DIAGNOSIS — C711 Malignant neoplasm of frontal lobe: Secondary | ICD-10-CM | POA: Diagnosis not present

## 2023-06-22 DIAGNOSIS — E785 Hyperlipidemia, unspecified: Secondary | ICD-10-CM | POA: Diagnosis not present

## 2023-06-22 DIAGNOSIS — R4701 Aphasia: Secondary | ICD-10-CM | POA: Diagnosis not present

## 2023-06-22 DIAGNOSIS — G8194 Hemiplegia, unspecified affecting left nondominant side: Secondary | ICD-10-CM | POA: Diagnosis not present

## 2023-06-22 DIAGNOSIS — I10 Essential (primary) hypertension: Secondary | ICD-10-CM | POA: Diagnosis not present

## 2023-06-22 DIAGNOSIS — G40909 Epilepsy, unspecified, not intractable, without status epilepticus: Secondary | ICD-10-CM | POA: Diagnosis not present

## 2023-06-22 DIAGNOSIS — S01112D Laceration without foreign body of left eyelid and periocular area, subsequent encounter: Secondary | ICD-10-CM | POA: Diagnosis not present

## 2023-06-22 DIAGNOSIS — R296 Repeated falls: Secondary | ICD-10-CM | POA: Diagnosis not present

## 2023-06-22 DIAGNOSIS — E119 Type 2 diabetes mellitus without complications: Secondary | ICD-10-CM | POA: Diagnosis not present

## 2023-06-22 DIAGNOSIS — K219 Gastro-esophageal reflux disease without esophagitis: Secondary | ICD-10-CM | POA: Diagnosis not present

## 2023-06-22 NOTE — Telephone Encounter (Signed)
 Physician order signed by Dr Barbaraann Cao & faxed to [facility], [phone].  Fax confirmation received.

## 2023-06-23 DIAGNOSIS — S42202D Unspecified fracture of upper end of left humerus, subsequent encounter for fracture with routine healing: Secondary | ICD-10-CM | POA: Diagnosis not present

## 2023-06-23 DIAGNOSIS — R4701 Aphasia: Secondary | ICD-10-CM | POA: Diagnosis not present

## 2023-06-23 DIAGNOSIS — C711 Malignant neoplasm of frontal lobe: Secondary | ICD-10-CM | POA: Diagnosis not present

## 2023-06-23 DIAGNOSIS — E785 Hyperlipidemia, unspecified: Secondary | ICD-10-CM | POA: Diagnosis not present

## 2023-06-23 DIAGNOSIS — K219 Gastro-esophageal reflux disease without esophagitis: Secondary | ICD-10-CM | POA: Diagnosis not present

## 2023-06-23 DIAGNOSIS — E119 Type 2 diabetes mellitus without complications: Secondary | ICD-10-CM | POA: Diagnosis not present

## 2023-06-24 ENCOUNTER — Telehealth: Payer: Self-pay | Admitting: *Deleted

## 2023-06-24 NOTE — Telephone Encounter (Signed)
 Hospice recertification plan of care update signed by Dr Barbaraann Cao & faxed to [facility], [phone].  Fax confirmation received.

## 2023-06-25 DIAGNOSIS — S42202D Unspecified fracture of upper end of left humerus, subsequent encounter for fracture with routine healing: Secondary | ICD-10-CM | POA: Diagnosis not present

## 2023-06-25 DIAGNOSIS — K219 Gastro-esophageal reflux disease without esophagitis: Secondary | ICD-10-CM | POA: Diagnosis not present

## 2023-06-25 DIAGNOSIS — R4701 Aphasia: Secondary | ICD-10-CM | POA: Diagnosis not present

## 2023-06-25 DIAGNOSIS — C711 Malignant neoplasm of frontal lobe: Secondary | ICD-10-CM | POA: Diagnosis not present

## 2023-06-25 DIAGNOSIS — E119 Type 2 diabetes mellitus without complications: Secondary | ICD-10-CM | POA: Diagnosis not present

## 2023-06-25 DIAGNOSIS — E785 Hyperlipidemia, unspecified: Secondary | ICD-10-CM | POA: Diagnosis not present

## 2023-06-28 DIAGNOSIS — C711 Malignant neoplasm of frontal lobe: Secondary | ICD-10-CM | POA: Diagnosis not present

## 2023-06-28 DIAGNOSIS — K219 Gastro-esophageal reflux disease without esophagitis: Secondary | ICD-10-CM | POA: Diagnosis not present

## 2023-06-28 DIAGNOSIS — S42202D Unspecified fracture of upper end of left humerus, subsequent encounter for fracture with routine healing: Secondary | ICD-10-CM | POA: Diagnosis not present

## 2023-06-28 DIAGNOSIS — E119 Type 2 diabetes mellitus without complications: Secondary | ICD-10-CM | POA: Diagnosis not present

## 2023-06-28 DIAGNOSIS — R4701 Aphasia: Secondary | ICD-10-CM | POA: Diagnosis not present

## 2023-06-28 DIAGNOSIS — E785 Hyperlipidemia, unspecified: Secondary | ICD-10-CM | POA: Diagnosis not present

## 2023-06-30 DIAGNOSIS — S42202D Unspecified fracture of upper end of left humerus, subsequent encounter for fracture with routine healing: Secondary | ICD-10-CM | POA: Diagnosis not present

## 2023-06-30 DIAGNOSIS — C711 Malignant neoplasm of frontal lobe: Secondary | ICD-10-CM | POA: Diagnosis not present

## 2023-06-30 DIAGNOSIS — E119 Type 2 diabetes mellitus without complications: Secondary | ICD-10-CM | POA: Diagnosis not present

## 2023-06-30 DIAGNOSIS — E785 Hyperlipidemia, unspecified: Secondary | ICD-10-CM | POA: Diagnosis not present

## 2023-06-30 DIAGNOSIS — R4701 Aphasia: Secondary | ICD-10-CM | POA: Diagnosis not present

## 2023-06-30 DIAGNOSIS — K219 Gastro-esophageal reflux disease without esophagitis: Secondary | ICD-10-CM | POA: Diagnosis not present

## 2023-07-02 DIAGNOSIS — K219 Gastro-esophageal reflux disease without esophagitis: Secondary | ICD-10-CM | POA: Diagnosis not present

## 2023-07-02 DIAGNOSIS — E119 Type 2 diabetes mellitus without complications: Secondary | ICD-10-CM | POA: Diagnosis not present

## 2023-07-02 DIAGNOSIS — S42202D Unspecified fracture of upper end of left humerus, subsequent encounter for fracture with routine healing: Secondary | ICD-10-CM | POA: Diagnosis not present

## 2023-07-02 DIAGNOSIS — C711 Malignant neoplasm of frontal lobe: Secondary | ICD-10-CM | POA: Diagnosis not present

## 2023-07-02 DIAGNOSIS — E785 Hyperlipidemia, unspecified: Secondary | ICD-10-CM | POA: Diagnosis not present

## 2023-07-02 DIAGNOSIS — R4701 Aphasia: Secondary | ICD-10-CM | POA: Diagnosis not present

## 2023-07-05 DIAGNOSIS — E785 Hyperlipidemia, unspecified: Secondary | ICD-10-CM | POA: Diagnosis not present

## 2023-07-05 DIAGNOSIS — R4701 Aphasia: Secondary | ICD-10-CM | POA: Diagnosis not present

## 2023-07-05 DIAGNOSIS — K219 Gastro-esophageal reflux disease without esophagitis: Secondary | ICD-10-CM | POA: Diagnosis not present

## 2023-07-05 DIAGNOSIS — E119 Type 2 diabetes mellitus without complications: Secondary | ICD-10-CM | POA: Diagnosis not present

## 2023-07-05 DIAGNOSIS — S42202D Unspecified fracture of upper end of left humerus, subsequent encounter for fracture with routine healing: Secondary | ICD-10-CM | POA: Diagnosis not present

## 2023-07-05 DIAGNOSIS — C711 Malignant neoplasm of frontal lobe: Secondary | ICD-10-CM | POA: Diagnosis not present

## 2023-07-07 DIAGNOSIS — E119 Type 2 diabetes mellitus without complications: Secondary | ICD-10-CM | POA: Diagnosis not present

## 2023-07-07 DIAGNOSIS — E785 Hyperlipidemia, unspecified: Secondary | ICD-10-CM | POA: Diagnosis not present

## 2023-07-07 DIAGNOSIS — R4701 Aphasia: Secondary | ICD-10-CM | POA: Diagnosis not present

## 2023-07-07 DIAGNOSIS — K219 Gastro-esophageal reflux disease without esophagitis: Secondary | ICD-10-CM | POA: Diagnosis not present

## 2023-07-07 DIAGNOSIS — S42202D Unspecified fracture of upper end of left humerus, subsequent encounter for fracture with routine healing: Secondary | ICD-10-CM | POA: Diagnosis not present

## 2023-07-07 DIAGNOSIS — C711 Malignant neoplasm of frontal lobe: Secondary | ICD-10-CM | POA: Diagnosis not present

## 2023-07-09 DIAGNOSIS — K219 Gastro-esophageal reflux disease without esophagitis: Secondary | ICD-10-CM | POA: Diagnosis not present

## 2023-07-09 DIAGNOSIS — E119 Type 2 diabetes mellitus without complications: Secondary | ICD-10-CM | POA: Diagnosis not present

## 2023-07-09 DIAGNOSIS — C711 Malignant neoplasm of frontal lobe: Secondary | ICD-10-CM | POA: Diagnosis not present

## 2023-07-09 DIAGNOSIS — E785 Hyperlipidemia, unspecified: Secondary | ICD-10-CM | POA: Diagnosis not present

## 2023-07-09 DIAGNOSIS — S42202D Unspecified fracture of upper end of left humerus, subsequent encounter for fracture with routine healing: Secondary | ICD-10-CM | POA: Diagnosis not present

## 2023-07-09 DIAGNOSIS — R4701 Aphasia: Secondary | ICD-10-CM | POA: Diagnosis not present

## 2023-07-12 DIAGNOSIS — E119 Type 2 diabetes mellitus without complications: Secondary | ICD-10-CM | POA: Diagnosis not present

## 2023-07-12 DIAGNOSIS — E785 Hyperlipidemia, unspecified: Secondary | ICD-10-CM | POA: Diagnosis not present

## 2023-07-12 DIAGNOSIS — R4701 Aphasia: Secondary | ICD-10-CM | POA: Diagnosis not present

## 2023-07-12 DIAGNOSIS — S42202D Unspecified fracture of upper end of left humerus, subsequent encounter for fracture with routine healing: Secondary | ICD-10-CM | POA: Diagnosis not present

## 2023-07-12 DIAGNOSIS — C711 Malignant neoplasm of frontal lobe: Secondary | ICD-10-CM | POA: Diagnosis not present

## 2023-07-12 DIAGNOSIS — K219 Gastro-esophageal reflux disease without esophagitis: Secondary | ICD-10-CM | POA: Diagnosis not present

## 2023-07-14 DIAGNOSIS — R4701 Aphasia: Secondary | ICD-10-CM | POA: Diagnosis not present

## 2023-07-14 DIAGNOSIS — K219 Gastro-esophageal reflux disease without esophagitis: Secondary | ICD-10-CM | POA: Diagnosis not present

## 2023-07-14 DIAGNOSIS — E119 Type 2 diabetes mellitus without complications: Secondary | ICD-10-CM | POA: Diagnosis not present

## 2023-07-14 DIAGNOSIS — E785 Hyperlipidemia, unspecified: Secondary | ICD-10-CM | POA: Diagnosis not present

## 2023-07-14 DIAGNOSIS — C711 Malignant neoplasm of frontal lobe: Secondary | ICD-10-CM | POA: Diagnosis not present

## 2023-07-14 DIAGNOSIS — S42202D Unspecified fracture of upper end of left humerus, subsequent encounter for fracture with routine healing: Secondary | ICD-10-CM | POA: Diagnosis not present

## 2023-07-16 DIAGNOSIS — E785 Hyperlipidemia, unspecified: Secondary | ICD-10-CM | POA: Diagnosis not present

## 2023-07-16 DIAGNOSIS — S42202D Unspecified fracture of upper end of left humerus, subsequent encounter for fracture with routine healing: Secondary | ICD-10-CM | POA: Diagnosis not present

## 2023-07-16 DIAGNOSIS — K219 Gastro-esophageal reflux disease without esophagitis: Secondary | ICD-10-CM | POA: Diagnosis not present

## 2023-07-16 DIAGNOSIS — R4701 Aphasia: Secondary | ICD-10-CM | POA: Diagnosis not present

## 2023-07-16 DIAGNOSIS — C711 Malignant neoplasm of frontal lobe: Secondary | ICD-10-CM | POA: Diagnosis not present

## 2023-07-16 DIAGNOSIS — E119 Type 2 diabetes mellitus without complications: Secondary | ICD-10-CM | POA: Diagnosis not present

## 2023-07-19 DIAGNOSIS — S42202D Unspecified fracture of upper end of left humerus, subsequent encounter for fracture with routine healing: Secondary | ICD-10-CM | POA: Diagnosis not present

## 2023-07-19 DIAGNOSIS — C711 Malignant neoplasm of frontal lobe: Secondary | ICD-10-CM | POA: Diagnosis not present

## 2023-07-19 DIAGNOSIS — K219 Gastro-esophageal reflux disease without esophagitis: Secondary | ICD-10-CM | POA: Diagnosis not present

## 2023-07-19 DIAGNOSIS — E119 Type 2 diabetes mellitus without complications: Secondary | ICD-10-CM | POA: Diagnosis not present

## 2023-07-19 DIAGNOSIS — E785 Hyperlipidemia, unspecified: Secondary | ICD-10-CM | POA: Diagnosis not present

## 2023-07-19 DIAGNOSIS — R4701 Aphasia: Secondary | ICD-10-CM | POA: Diagnosis not present

## 2023-07-21 DIAGNOSIS — C711 Malignant neoplasm of frontal lobe: Secondary | ICD-10-CM | POA: Diagnosis not present

## 2023-07-21 DIAGNOSIS — E119 Type 2 diabetes mellitus without complications: Secondary | ICD-10-CM | POA: Diagnosis not present

## 2023-07-21 DIAGNOSIS — E785 Hyperlipidemia, unspecified: Secondary | ICD-10-CM | POA: Diagnosis not present

## 2023-07-21 DIAGNOSIS — R4701 Aphasia: Secondary | ICD-10-CM | POA: Diagnosis not present

## 2023-07-21 DIAGNOSIS — S42202D Unspecified fracture of upper end of left humerus, subsequent encounter for fracture with routine healing: Secondary | ICD-10-CM | POA: Diagnosis not present

## 2023-07-21 DIAGNOSIS — K219 Gastro-esophageal reflux disease without esophagitis: Secondary | ICD-10-CM | POA: Diagnosis not present

## 2023-07-22 DIAGNOSIS — G40909 Epilepsy, unspecified, not intractable, without status epilepticus: Secondary | ICD-10-CM | POA: Diagnosis not present

## 2023-07-22 DIAGNOSIS — R4701 Aphasia: Secondary | ICD-10-CM | POA: Diagnosis not present

## 2023-07-22 DIAGNOSIS — C711 Malignant neoplasm of frontal lobe: Secondary | ICD-10-CM | POA: Diagnosis not present

## 2023-07-22 DIAGNOSIS — S01112D Laceration without foreign body of left eyelid and periocular area, subsequent encounter: Secondary | ICD-10-CM | POA: Diagnosis not present

## 2023-07-22 DIAGNOSIS — R296 Repeated falls: Secondary | ICD-10-CM | POA: Diagnosis not present

## 2023-07-22 DIAGNOSIS — I10 Essential (primary) hypertension: Secondary | ICD-10-CM | POA: Diagnosis not present

## 2023-07-22 DIAGNOSIS — E119 Type 2 diabetes mellitus without complications: Secondary | ICD-10-CM | POA: Diagnosis not present

## 2023-07-22 DIAGNOSIS — K219 Gastro-esophageal reflux disease without esophagitis: Secondary | ICD-10-CM | POA: Diagnosis not present

## 2023-07-22 DIAGNOSIS — S42202D Unspecified fracture of upper end of left humerus, subsequent encounter for fracture with routine healing: Secondary | ICD-10-CM | POA: Diagnosis not present

## 2023-07-22 DIAGNOSIS — R32 Unspecified urinary incontinence: Secondary | ICD-10-CM | POA: Diagnosis not present

## 2023-07-22 DIAGNOSIS — G8194 Hemiplegia, unspecified affecting left nondominant side: Secondary | ICD-10-CM | POA: Diagnosis not present

## 2023-07-22 DIAGNOSIS — E785 Hyperlipidemia, unspecified: Secondary | ICD-10-CM | POA: Diagnosis not present

## 2023-07-23 DIAGNOSIS — E119 Type 2 diabetes mellitus without complications: Secondary | ICD-10-CM | POA: Diagnosis not present

## 2023-07-23 DIAGNOSIS — S42202D Unspecified fracture of upper end of left humerus, subsequent encounter for fracture with routine healing: Secondary | ICD-10-CM | POA: Diagnosis not present

## 2023-07-23 DIAGNOSIS — C711 Malignant neoplasm of frontal lobe: Secondary | ICD-10-CM | POA: Diagnosis not present

## 2023-07-23 DIAGNOSIS — R4701 Aphasia: Secondary | ICD-10-CM | POA: Diagnosis not present

## 2023-07-23 DIAGNOSIS — E785 Hyperlipidemia, unspecified: Secondary | ICD-10-CM | POA: Diagnosis not present

## 2023-07-23 DIAGNOSIS — K219 Gastro-esophageal reflux disease without esophagitis: Secondary | ICD-10-CM | POA: Diagnosis not present

## 2023-07-26 DIAGNOSIS — K219 Gastro-esophageal reflux disease without esophagitis: Secondary | ICD-10-CM | POA: Diagnosis not present

## 2023-07-26 DIAGNOSIS — S42202D Unspecified fracture of upper end of left humerus, subsequent encounter for fracture with routine healing: Secondary | ICD-10-CM | POA: Diagnosis not present

## 2023-07-26 DIAGNOSIS — E119 Type 2 diabetes mellitus without complications: Secondary | ICD-10-CM | POA: Diagnosis not present

## 2023-07-26 DIAGNOSIS — C711 Malignant neoplasm of frontal lobe: Secondary | ICD-10-CM | POA: Diagnosis not present

## 2023-07-26 DIAGNOSIS — E785 Hyperlipidemia, unspecified: Secondary | ICD-10-CM | POA: Diagnosis not present

## 2023-07-26 DIAGNOSIS — R4701 Aphasia: Secondary | ICD-10-CM | POA: Diagnosis not present

## 2023-07-27 DIAGNOSIS — S42202D Unspecified fracture of upper end of left humerus, subsequent encounter for fracture with routine healing: Secondary | ICD-10-CM | POA: Diagnosis not present

## 2023-07-27 DIAGNOSIS — R4701 Aphasia: Secondary | ICD-10-CM | POA: Diagnosis not present

## 2023-07-27 DIAGNOSIS — E119 Type 2 diabetes mellitus without complications: Secondary | ICD-10-CM | POA: Diagnosis not present

## 2023-07-27 DIAGNOSIS — E785 Hyperlipidemia, unspecified: Secondary | ICD-10-CM | POA: Diagnosis not present

## 2023-07-27 DIAGNOSIS — C711 Malignant neoplasm of frontal lobe: Secondary | ICD-10-CM | POA: Diagnosis not present

## 2023-07-27 DIAGNOSIS — K219 Gastro-esophageal reflux disease without esophagitis: Secondary | ICD-10-CM | POA: Diagnosis not present

## 2023-07-28 DIAGNOSIS — C711 Malignant neoplasm of frontal lobe: Secondary | ICD-10-CM | POA: Diagnosis not present

## 2023-07-28 DIAGNOSIS — K219 Gastro-esophageal reflux disease without esophagitis: Secondary | ICD-10-CM | POA: Diagnosis not present

## 2023-07-28 DIAGNOSIS — S42202D Unspecified fracture of upper end of left humerus, subsequent encounter for fracture with routine healing: Secondary | ICD-10-CM | POA: Diagnosis not present

## 2023-07-28 DIAGNOSIS — E119 Type 2 diabetes mellitus without complications: Secondary | ICD-10-CM | POA: Diagnosis not present

## 2023-07-28 DIAGNOSIS — E785 Hyperlipidemia, unspecified: Secondary | ICD-10-CM | POA: Diagnosis not present

## 2023-07-28 DIAGNOSIS — R4701 Aphasia: Secondary | ICD-10-CM | POA: Diagnosis not present

## 2023-07-30 DIAGNOSIS — C711 Malignant neoplasm of frontal lobe: Secondary | ICD-10-CM | POA: Diagnosis not present

## 2023-07-30 DIAGNOSIS — K219 Gastro-esophageal reflux disease without esophagitis: Secondary | ICD-10-CM | POA: Diagnosis not present

## 2023-07-30 DIAGNOSIS — E119 Type 2 diabetes mellitus without complications: Secondary | ICD-10-CM | POA: Diagnosis not present

## 2023-07-30 DIAGNOSIS — E785 Hyperlipidemia, unspecified: Secondary | ICD-10-CM | POA: Diagnosis not present

## 2023-07-30 DIAGNOSIS — S42202D Unspecified fracture of upper end of left humerus, subsequent encounter for fracture with routine healing: Secondary | ICD-10-CM | POA: Diagnosis not present

## 2023-07-30 DIAGNOSIS — R4701 Aphasia: Secondary | ICD-10-CM | POA: Diagnosis not present

## 2023-08-02 DIAGNOSIS — S42202D Unspecified fracture of upper end of left humerus, subsequent encounter for fracture with routine healing: Secondary | ICD-10-CM | POA: Diagnosis not present

## 2023-08-02 DIAGNOSIS — E119 Type 2 diabetes mellitus without complications: Secondary | ICD-10-CM | POA: Diagnosis not present

## 2023-08-02 DIAGNOSIS — K219 Gastro-esophageal reflux disease without esophagitis: Secondary | ICD-10-CM | POA: Diagnosis not present

## 2023-08-02 DIAGNOSIS — R4701 Aphasia: Secondary | ICD-10-CM | POA: Diagnosis not present

## 2023-08-02 DIAGNOSIS — E785 Hyperlipidemia, unspecified: Secondary | ICD-10-CM | POA: Diagnosis not present

## 2023-08-02 DIAGNOSIS — C711 Malignant neoplasm of frontal lobe: Secondary | ICD-10-CM | POA: Diagnosis not present

## 2023-08-04 DIAGNOSIS — E119 Type 2 diabetes mellitus without complications: Secondary | ICD-10-CM | POA: Diagnosis not present

## 2023-08-04 DIAGNOSIS — R4701 Aphasia: Secondary | ICD-10-CM | POA: Diagnosis not present

## 2023-08-04 DIAGNOSIS — C711 Malignant neoplasm of frontal lobe: Secondary | ICD-10-CM | POA: Diagnosis not present

## 2023-08-04 DIAGNOSIS — S42202D Unspecified fracture of upper end of left humerus, subsequent encounter for fracture with routine healing: Secondary | ICD-10-CM | POA: Diagnosis not present

## 2023-08-04 DIAGNOSIS — E785 Hyperlipidemia, unspecified: Secondary | ICD-10-CM | POA: Diagnosis not present

## 2023-08-04 DIAGNOSIS — K219 Gastro-esophageal reflux disease without esophagitis: Secondary | ICD-10-CM | POA: Diagnosis not present

## 2023-08-06 DIAGNOSIS — E119 Type 2 diabetes mellitus without complications: Secondary | ICD-10-CM | POA: Diagnosis not present

## 2023-08-06 DIAGNOSIS — C711 Malignant neoplasm of frontal lobe: Secondary | ICD-10-CM | POA: Diagnosis not present

## 2023-08-06 DIAGNOSIS — S42202D Unspecified fracture of upper end of left humerus, subsequent encounter for fracture with routine healing: Secondary | ICD-10-CM | POA: Diagnosis not present

## 2023-08-06 DIAGNOSIS — K219 Gastro-esophageal reflux disease without esophagitis: Secondary | ICD-10-CM | POA: Diagnosis not present

## 2023-08-06 DIAGNOSIS — E785 Hyperlipidemia, unspecified: Secondary | ICD-10-CM | POA: Diagnosis not present

## 2023-08-06 DIAGNOSIS — R4701 Aphasia: Secondary | ICD-10-CM | POA: Diagnosis not present

## 2023-08-09 DIAGNOSIS — S42202D Unspecified fracture of upper end of left humerus, subsequent encounter for fracture with routine healing: Secondary | ICD-10-CM | POA: Diagnosis not present

## 2023-08-09 DIAGNOSIS — C711 Malignant neoplasm of frontal lobe: Secondary | ICD-10-CM | POA: Diagnosis not present

## 2023-08-09 DIAGNOSIS — E119 Type 2 diabetes mellitus without complications: Secondary | ICD-10-CM | POA: Diagnosis not present

## 2023-08-09 DIAGNOSIS — E785 Hyperlipidemia, unspecified: Secondary | ICD-10-CM | POA: Diagnosis not present

## 2023-08-09 DIAGNOSIS — K219 Gastro-esophageal reflux disease without esophagitis: Secondary | ICD-10-CM | POA: Diagnosis not present

## 2023-08-09 DIAGNOSIS — R4701 Aphasia: Secondary | ICD-10-CM | POA: Diagnosis not present

## 2023-08-11 DIAGNOSIS — R4701 Aphasia: Secondary | ICD-10-CM | POA: Diagnosis not present

## 2023-08-11 DIAGNOSIS — C711 Malignant neoplasm of frontal lobe: Secondary | ICD-10-CM | POA: Diagnosis not present

## 2023-08-11 DIAGNOSIS — E785 Hyperlipidemia, unspecified: Secondary | ICD-10-CM | POA: Diagnosis not present

## 2023-08-11 DIAGNOSIS — K219 Gastro-esophageal reflux disease without esophagitis: Secondary | ICD-10-CM | POA: Diagnosis not present

## 2023-08-11 DIAGNOSIS — S42202D Unspecified fracture of upper end of left humerus, subsequent encounter for fracture with routine healing: Secondary | ICD-10-CM | POA: Diagnosis not present

## 2023-08-11 DIAGNOSIS — E119 Type 2 diabetes mellitus without complications: Secondary | ICD-10-CM | POA: Diagnosis not present

## 2023-08-13 DIAGNOSIS — K219 Gastro-esophageal reflux disease without esophagitis: Secondary | ICD-10-CM | POA: Diagnosis not present

## 2023-08-13 DIAGNOSIS — E785 Hyperlipidemia, unspecified: Secondary | ICD-10-CM | POA: Diagnosis not present

## 2023-08-13 DIAGNOSIS — S42202D Unspecified fracture of upper end of left humerus, subsequent encounter for fracture with routine healing: Secondary | ICD-10-CM | POA: Diagnosis not present

## 2023-08-13 DIAGNOSIS — R4701 Aphasia: Secondary | ICD-10-CM | POA: Diagnosis not present

## 2023-08-13 DIAGNOSIS — C711 Malignant neoplasm of frontal lobe: Secondary | ICD-10-CM | POA: Diagnosis not present

## 2023-08-13 DIAGNOSIS — E119 Type 2 diabetes mellitus without complications: Secondary | ICD-10-CM | POA: Diagnosis not present

## 2023-08-14 DIAGNOSIS — R4701 Aphasia: Secondary | ICD-10-CM | POA: Diagnosis not present

## 2023-08-14 DIAGNOSIS — K219 Gastro-esophageal reflux disease without esophagitis: Secondary | ICD-10-CM | POA: Diagnosis not present

## 2023-08-14 DIAGNOSIS — E785 Hyperlipidemia, unspecified: Secondary | ICD-10-CM | POA: Diagnosis not present

## 2023-08-14 DIAGNOSIS — S42202D Unspecified fracture of upper end of left humerus, subsequent encounter for fracture with routine healing: Secondary | ICD-10-CM | POA: Diagnosis not present

## 2023-08-14 DIAGNOSIS — E119 Type 2 diabetes mellitus without complications: Secondary | ICD-10-CM | POA: Diagnosis not present

## 2023-08-14 DIAGNOSIS — C711 Malignant neoplasm of frontal lobe: Secondary | ICD-10-CM | POA: Diagnosis not present

## 2023-08-15 DIAGNOSIS — C711 Malignant neoplasm of frontal lobe: Secondary | ICD-10-CM | POA: Diagnosis not present

## 2023-08-15 DIAGNOSIS — S42202D Unspecified fracture of upper end of left humerus, subsequent encounter for fracture with routine healing: Secondary | ICD-10-CM | POA: Diagnosis not present

## 2023-08-15 DIAGNOSIS — E119 Type 2 diabetes mellitus without complications: Secondary | ICD-10-CM | POA: Diagnosis not present

## 2023-08-15 DIAGNOSIS — R4701 Aphasia: Secondary | ICD-10-CM | POA: Diagnosis not present

## 2023-08-15 DIAGNOSIS — K219 Gastro-esophageal reflux disease without esophagitis: Secondary | ICD-10-CM | POA: Diagnosis not present

## 2023-08-15 DIAGNOSIS — E785 Hyperlipidemia, unspecified: Secondary | ICD-10-CM | POA: Diagnosis not present

## 2023-08-16 DIAGNOSIS — R4701 Aphasia: Secondary | ICD-10-CM | POA: Diagnosis not present

## 2023-08-16 DIAGNOSIS — S42202D Unspecified fracture of upper end of left humerus, subsequent encounter for fracture with routine healing: Secondary | ICD-10-CM | POA: Diagnosis not present

## 2023-08-16 DIAGNOSIS — K219 Gastro-esophageal reflux disease without esophagitis: Secondary | ICD-10-CM | POA: Diagnosis not present

## 2023-08-16 DIAGNOSIS — E119 Type 2 diabetes mellitus without complications: Secondary | ICD-10-CM | POA: Diagnosis not present

## 2023-08-16 DIAGNOSIS — E785 Hyperlipidemia, unspecified: Secondary | ICD-10-CM | POA: Diagnosis not present

## 2023-08-16 DIAGNOSIS — C711 Malignant neoplasm of frontal lobe: Secondary | ICD-10-CM | POA: Diagnosis not present

## 2023-08-17 DIAGNOSIS — R4701 Aphasia: Secondary | ICD-10-CM | POA: Diagnosis not present

## 2023-08-17 DIAGNOSIS — S42202D Unspecified fracture of upper end of left humerus, subsequent encounter for fracture with routine healing: Secondary | ICD-10-CM | POA: Diagnosis not present

## 2023-08-17 DIAGNOSIS — E785 Hyperlipidemia, unspecified: Secondary | ICD-10-CM | POA: Diagnosis not present

## 2023-08-17 DIAGNOSIS — K219 Gastro-esophageal reflux disease without esophagitis: Secondary | ICD-10-CM | POA: Diagnosis not present

## 2023-08-17 DIAGNOSIS — C711 Malignant neoplasm of frontal lobe: Secondary | ICD-10-CM | POA: Diagnosis not present

## 2023-08-17 DIAGNOSIS — E119 Type 2 diabetes mellitus without complications: Secondary | ICD-10-CM | POA: Diagnosis not present

## 2023-08-18 DIAGNOSIS — E785 Hyperlipidemia, unspecified: Secondary | ICD-10-CM | POA: Diagnosis not present

## 2023-08-18 DIAGNOSIS — R4701 Aphasia: Secondary | ICD-10-CM | POA: Diagnosis not present

## 2023-08-18 DIAGNOSIS — S42202D Unspecified fracture of upper end of left humerus, subsequent encounter for fracture with routine healing: Secondary | ICD-10-CM | POA: Diagnosis not present

## 2023-08-18 DIAGNOSIS — E119 Type 2 diabetes mellitus without complications: Secondary | ICD-10-CM | POA: Diagnosis not present

## 2023-08-18 DIAGNOSIS — C711 Malignant neoplasm of frontal lobe: Secondary | ICD-10-CM | POA: Diagnosis not present

## 2023-08-18 DIAGNOSIS — K219 Gastro-esophageal reflux disease without esophagitis: Secondary | ICD-10-CM | POA: Diagnosis not present

## 2023-08-19 ENCOUNTER — Telehealth: Payer: Self-pay | Admitting: *Deleted

## 2023-08-19 DIAGNOSIS — K219 Gastro-esophageal reflux disease without esophagitis: Secondary | ICD-10-CM | POA: Diagnosis not present

## 2023-08-19 DIAGNOSIS — R4701 Aphasia: Secondary | ICD-10-CM | POA: Diagnosis not present

## 2023-08-19 DIAGNOSIS — E119 Type 2 diabetes mellitus without complications: Secondary | ICD-10-CM | POA: Diagnosis not present

## 2023-08-19 DIAGNOSIS — S42202D Unspecified fracture of upper end of left humerus, subsequent encounter for fracture with routine healing: Secondary | ICD-10-CM | POA: Diagnosis not present

## 2023-08-19 DIAGNOSIS — E785 Hyperlipidemia, unspecified: Secondary | ICD-10-CM | POA: Diagnosis not present

## 2023-08-19 DIAGNOSIS — C711 Malignant neoplasm of frontal lobe: Secondary | ICD-10-CM | POA: Diagnosis not present

## 2023-08-19 NOTE — Telephone Encounter (Signed)
 [facility] physician orders signed by Dr Mark Sil for this patient, faxed to [phone].  Fax confirmation received.

## 2023-08-20 DIAGNOSIS — C711 Malignant neoplasm of frontal lobe: Secondary | ICD-10-CM | POA: Diagnosis not present

## 2023-08-20 DIAGNOSIS — S42202D Unspecified fracture of upper end of left humerus, subsequent encounter for fracture with routine healing: Secondary | ICD-10-CM | POA: Diagnosis not present

## 2023-08-20 DIAGNOSIS — E119 Type 2 diabetes mellitus without complications: Secondary | ICD-10-CM | POA: Diagnosis not present

## 2023-08-20 DIAGNOSIS — K219 Gastro-esophageal reflux disease without esophagitis: Secondary | ICD-10-CM | POA: Diagnosis not present

## 2023-08-20 DIAGNOSIS — R4701 Aphasia: Secondary | ICD-10-CM | POA: Diagnosis not present

## 2023-08-20 DIAGNOSIS — E785 Hyperlipidemia, unspecified: Secondary | ICD-10-CM | POA: Diagnosis not present

## 2023-08-21 DIAGNOSIS — K219 Gastro-esophageal reflux disease without esophagitis: Secondary | ICD-10-CM | POA: Diagnosis not present

## 2023-08-21 DIAGNOSIS — R4701 Aphasia: Secondary | ICD-10-CM | POA: Diagnosis not present

## 2023-08-21 DIAGNOSIS — S42202D Unspecified fracture of upper end of left humerus, subsequent encounter for fracture with routine healing: Secondary | ICD-10-CM | POA: Diagnosis not present

## 2023-08-21 DIAGNOSIS — C711 Malignant neoplasm of frontal lobe: Secondary | ICD-10-CM | POA: Diagnosis not present

## 2023-08-21 DIAGNOSIS — E119 Type 2 diabetes mellitus without complications: Secondary | ICD-10-CM | POA: Diagnosis not present

## 2023-08-21 DIAGNOSIS — E785 Hyperlipidemia, unspecified: Secondary | ICD-10-CM | POA: Diagnosis not present

## 2023-08-22 DIAGNOSIS — I10 Essential (primary) hypertension: Secondary | ICD-10-CM | POA: Diagnosis not present

## 2023-08-22 DIAGNOSIS — G40909 Epilepsy, unspecified, not intractable, without status epilepticus: Secondary | ICD-10-CM | POA: Diagnosis not present

## 2023-08-22 DIAGNOSIS — R4701 Aphasia: Secondary | ICD-10-CM | POA: Diagnosis not present

## 2023-08-22 DIAGNOSIS — E119 Type 2 diabetes mellitus without complications: Secondary | ICD-10-CM | POA: Diagnosis not present

## 2023-08-22 DIAGNOSIS — G8194 Hemiplegia, unspecified affecting left nondominant side: Secondary | ICD-10-CM | POA: Diagnosis not present

## 2023-08-22 DIAGNOSIS — C711 Malignant neoplasm of frontal lobe: Secondary | ICD-10-CM | POA: Diagnosis not present

## 2023-08-22 DIAGNOSIS — S42202D Unspecified fracture of upper end of left humerus, subsequent encounter for fracture with routine healing: Secondary | ICD-10-CM | POA: Diagnosis not present

## 2023-08-22 DIAGNOSIS — R32 Unspecified urinary incontinence: Secondary | ICD-10-CM | POA: Diagnosis not present

## 2023-08-22 DIAGNOSIS — E785 Hyperlipidemia, unspecified: Secondary | ICD-10-CM | POA: Diagnosis not present

## 2023-08-22 DIAGNOSIS — R296 Repeated falls: Secondary | ICD-10-CM | POA: Diagnosis not present

## 2023-08-22 DIAGNOSIS — K219 Gastro-esophageal reflux disease without esophagitis: Secondary | ICD-10-CM | POA: Diagnosis not present

## 2023-08-22 DIAGNOSIS — S01112D Laceration without foreign body of left eyelid and periocular area, subsequent encounter: Secondary | ICD-10-CM | POA: Diagnosis not present

## 2023-08-23 ENCOUNTER — Telehealth: Payer: Self-pay | Admitting: *Deleted

## 2023-08-23 DIAGNOSIS — C711 Malignant neoplasm of frontal lobe: Secondary | ICD-10-CM | POA: Diagnosis not present

## 2023-08-23 DIAGNOSIS — K219 Gastro-esophageal reflux disease without esophagitis: Secondary | ICD-10-CM | POA: Diagnosis not present

## 2023-08-23 DIAGNOSIS — E119 Type 2 diabetes mellitus without complications: Secondary | ICD-10-CM | POA: Diagnosis not present

## 2023-08-23 DIAGNOSIS — S42202D Unspecified fracture of upper end of left humerus, subsequent encounter for fracture with routine healing: Secondary | ICD-10-CM | POA: Diagnosis not present

## 2023-08-23 DIAGNOSIS — R4701 Aphasia: Secondary | ICD-10-CM | POA: Diagnosis not present

## 2023-08-23 DIAGNOSIS — E785 Hyperlipidemia, unspecified: Secondary | ICD-10-CM | POA: Diagnosis not present

## 2023-08-23 NOTE — Telephone Encounter (Signed)
 Physician order from [facility] signed by Dr Mark Sil & faxed to [phone].  Fax confirmation received.

## 2023-08-25 DIAGNOSIS — R4701 Aphasia: Secondary | ICD-10-CM | POA: Diagnosis not present

## 2023-08-25 DIAGNOSIS — C711 Malignant neoplasm of frontal lobe: Secondary | ICD-10-CM | POA: Diagnosis not present

## 2023-08-25 DIAGNOSIS — K219 Gastro-esophageal reflux disease without esophagitis: Secondary | ICD-10-CM | POA: Diagnosis not present

## 2023-08-25 DIAGNOSIS — E785 Hyperlipidemia, unspecified: Secondary | ICD-10-CM | POA: Diagnosis not present

## 2023-08-25 DIAGNOSIS — S42202D Unspecified fracture of upper end of left humerus, subsequent encounter for fracture with routine healing: Secondary | ICD-10-CM | POA: Diagnosis not present

## 2023-08-25 DIAGNOSIS — E119 Type 2 diabetes mellitus without complications: Secondary | ICD-10-CM | POA: Diagnosis not present

## 2023-08-27 DIAGNOSIS — K219 Gastro-esophageal reflux disease without esophagitis: Secondary | ICD-10-CM | POA: Diagnosis not present

## 2023-08-27 DIAGNOSIS — C711 Malignant neoplasm of frontal lobe: Secondary | ICD-10-CM | POA: Diagnosis not present

## 2023-08-27 DIAGNOSIS — R4701 Aphasia: Secondary | ICD-10-CM | POA: Diagnosis not present

## 2023-08-27 DIAGNOSIS — E119 Type 2 diabetes mellitus without complications: Secondary | ICD-10-CM | POA: Diagnosis not present

## 2023-08-27 DIAGNOSIS — S42202D Unspecified fracture of upper end of left humerus, subsequent encounter for fracture with routine healing: Secondary | ICD-10-CM | POA: Diagnosis not present

## 2023-08-27 DIAGNOSIS — E785 Hyperlipidemia, unspecified: Secondary | ICD-10-CM | POA: Diagnosis not present

## 2023-08-30 DIAGNOSIS — E785 Hyperlipidemia, unspecified: Secondary | ICD-10-CM | POA: Diagnosis not present

## 2023-08-30 DIAGNOSIS — K219 Gastro-esophageal reflux disease without esophagitis: Secondary | ICD-10-CM | POA: Diagnosis not present

## 2023-08-30 DIAGNOSIS — R4701 Aphasia: Secondary | ICD-10-CM | POA: Diagnosis not present

## 2023-08-30 DIAGNOSIS — E119 Type 2 diabetes mellitus without complications: Secondary | ICD-10-CM | POA: Diagnosis not present

## 2023-08-30 DIAGNOSIS — C711 Malignant neoplasm of frontal lobe: Secondary | ICD-10-CM | POA: Diagnosis not present

## 2023-08-30 DIAGNOSIS — S42202D Unspecified fracture of upper end of left humerus, subsequent encounter for fracture with routine healing: Secondary | ICD-10-CM | POA: Diagnosis not present

## 2023-09-01 DIAGNOSIS — K219 Gastro-esophageal reflux disease without esophagitis: Secondary | ICD-10-CM | POA: Diagnosis not present

## 2023-09-01 DIAGNOSIS — E785 Hyperlipidemia, unspecified: Secondary | ICD-10-CM | POA: Diagnosis not present

## 2023-09-01 DIAGNOSIS — E119 Type 2 diabetes mellitus without complications: Secondary | ICD-10-CM | POA: Diagnosis not present

## 2023-09-01 DIAGNOSIS — R4701 Aphasia: Secondary | ICD-10-CM | POA: Diagnosis not present

## 2023-09-01 DIAGNOSIS — S42202D Unspecified fracture of upper end of left humerus, subsequent encounter for fracture with routine healing: Secondary | ICD-10-CM | POA: Diagnosis not present

## 2023-09-01 DIAGNOSIS — C711 Malignant neoplasm of frontal lobe: Secondary | ICD-10-CM | POA: Diagnosis not present

## 2023-09-03 DIAGNOSIS — K219 Gastro-esophageal reflux disease without esophagitis: Secondary | ICD-10-CM | POA: Diagnosis not present

## 2023-09-03 DIAGNOSIS — C711 Malignant neoplasm of frontal lobe: Secondary | ICD-10-CM | POA: Diagnosis not present

## 2023-09-03 DIAGNOSIS — S42202D Unspecified fracture of upper end of left humerus, subsequent encounter for fracture with routine healing: Secondary | ICD-10-CM | POA: Diagnosis not present

## 2023-09-03 DIAGNOSIS — E119 Type 2 diabetes mellitus without complications: Secondary | ICD-10-CM | POA: Diagnosis not present

## 2023-09-03 DIAGNOSIS — E785 Hyperlipidemia, unspecified: Secondary | ICD-10-CM | POA: Diagnosis not present

## 2023-09-03 DIAGNOSIS — R4701 Aphasia: Secondary | ICD-10-CM | POA: Diagnosis not present

## 2023-09-06 DIAGNOSIS — E119 Type 2 diabetes mellitus without complications: Secondary | ICD-10-CM | POA: Diagnosis not present

## 2023-09-06 DIAGNOSIS — R4701 Aphasia: Secondary | ICD-10-CM | POA: Diagnosis not present

## 2023-09-06 DIAGNOSIS — C711 Malignant neoplasm of frontal lobe: Secondary | ICD-10-CM | POA: Diagnosis not present

## 2023-09-06 DIAGNOSIS — E785 Hyperlipidemia, unspecified: Secondary | ICD-10-CM | POA: Diagnosis not present

## 2023-09-06 DIAGNOSIS — S42202D Unspecified fracture of upper end of left humerus, subsequent encounter for fracture with routine healing: Secondary | ICD-10-CM | POA: Diagnosis not present

## 2023-09-06 DIAGNOSIS — K219 Gastro-esophageal reflux disease without esophagitis: Secondary | ICD-10-CM | POA: Diagnosis not present

## 2023-09-08 DIAGNOSIS — E785 Hyperlipidemia, unspecified: Secondary | ICD-10-CM | POA: Diagnosis not present

## 2023-09-08 DIAGNOSIS — C711 Malignant neoplasm of frontal lobe: Secondary | ICD-10-CM | POA: Diagnosis not present

## 2023-09-08 DIAGNOSIS — E119 Type 2 diabetes mellitus without complications: Secondary | ICD-10-CM | POA: Diagnosis not present

## 2023-09-08 DIAGNOSIS — R4701 Aphasia: Secondary | ICD-10-CM | POA: Diagnosis not present

## 2023-09-08 DIAGNOSIS — K219 Gastro-esophageal reflux disease without esophagitis: Secondary | ICD-10-CM | POA: Diagnosis not present

## 2023-09-08 DIAGNOSIS — S42202D Unspecified fracture of upper end of left humerus, subsequent encounter for fracture with routine healing: Secondary | ICD-10-CM | POA: Diagnosis not present

## 2023-09-12 DIAGNOSIS — S42202D Unspecified fracture of upper end of left humerus, subsequent encounter for fracture with routine healing: Secondary | ICD-10-CM | POA: Diagnosis not present

## 2023-09-12 DIAGNOSIS — E119 Type 2 diabetes mellitus without complications: Secondary | ICD-10-CM | POA: Diagnosis not present

## 2023-09-12 DIAGNOSIS — E785 Hyperlipidemia, unspecified: Secondary | ICD-10-CM | POA: Diagnosis not present

## 2023-09-12 DIAGNOSIS — C711 Malignant neoplasm of frontal lobe: Secondary | ICD-10-CM | POA: Diagnosis not present

## 2023-09-12 DIAGNOSIS — R4701 Aphasia: Secondary | ICD-10-CM | POA: Diagnosis not present

## 2023-09-12 DIAGNOSIS — K219 Gastro-esophageal reflux disease without esophagitis: Secondary | ICD-10-CM | POA: Diagnosis not present

## 2023-09-13 DIAGNOSIS — R4701 Aphasia: Secondary | ICD-10-CM | POA: Diagnosis not present

## 2023-09-13 DIAGNOSIS — E785 Hyperlipidemia, unspecified: Secondary | ICD-10-CM | POA: Diagnosis not present

## 2023-09-13 DIAGNOSIS — K219 Gastro-esophageal reflux disease without esophagitis: Secondary | ICD-10-CM | POA: Diagnosis not present

## 2023-09-13 DIAGNOSIS — C711 Malignant neoplasm of frontal lobe: Secondary | ICD-10-CM | POA: Diagnosis not present

## 2023-09-13 DIAGNOSIS — S42202D Unspecified fracture of upper end of left humerus, subsequent encounter for fracture with routine healing: Secondary | ICD-10-CM | POA: Diagnosis not present

## 2023-09-13 DIAGNOSIS — E119 Type 2 diabetes mellitus without complications: Secondary | ICD-10-CM | POA: Diagnosis not present

## 2023-09-15 DIAGNOSIS — R4701 Aphasia: Secondary | ICD-10-CM | POA: Diagnosis not present

## 2023-09-15 DIAGNOSIS — C711 Malignant neoplasm of frontal lobe: Secondary | ICD-10-CM | POA: Diagnosis not present

## 2023-09-15 DIAGNOSIS — E785 Hyperlipidemia, unspecified: Secondary | ICD-10-CM | POA: Diagnosis not present

## 2023-09-15 DIAGNOSIS — S42202D Unspecified fracture of upper end of left humerus, subsequent encounter for fracture with routine healing: Secondary | ICD-10-CM | POA: Diagnosis not present

## 2023-09-15 DIAGNOSIS — K219 Gastro-esophageal reflux disease without esophagitis: Secondary | ICD-10-CM | POA: Diagnosis not present

## 2023-09-15 DIAGNOSIS — E119 Type 2 diabetes mellitus without complications: Secondary | ICD-10-CM | POA: Diagnosis not present

## 2023-09-17 DIAGNOSIS — C711 Malignant neoplasm of frontal lobe: Secondary | ICD-10-CM | POA: Diagnosis not present

## 2023-09-17 DIAGNOSIS — S42202D Unspecified fracture of upper end of left humerus, subsequent encounter for fracture with routine healing: Secondary | ICD-10-CM | POA: Diagnosis not present

## 2023-09-17 DIAGNOSIS — R4701 Aphasia: Secondary | ICD-10-CM | POA: Diagnosis not present

## 2023-09-17 DIAGNOSIS — E119 Type 2 diabetes mellitus without complications: Secondary | ICD-10-CM | POA: Diagnosis not present

## 2023-09-17 DIAGNOSIS — K219 Gastro-esophageal reflux disease without esophagitis: Secondary | ICD-10-CM | POA: Diagnosis not present

## 2023-09-17 DIAGNOSIS — E785 Hyperlipidemia, unspecified: Secondary | ICD-10-CM | POA: Diagnosis not present

## 2023-09-20 DIAGNOSIS — K219 Gastro-esophageal reflux disease without esophagitis: Secondary | ICD-10-CM | POA: Diagnosis not present

## 2023-09-20 DIAGNOSIS — S42202D Unspecified fracture of upper end of left humerus, subsequent encounter for fracture with routine healing: Secondary | ICD-10-CM | POA: Diagnosis not present

## 2023-09-20 DIAGNOSIS — C711 Malignant neoplasm of frontal lobe: Secondary | ICD-10-CM | POA: Diagnosis not present

## 2023-09-20 DIAGNOSIS — E119 Type 2 diabetes mellitus without complications: Secondary | ICD-10-CM | POA: Diagnosis not present

## 2023-09-20 DIAGNOSIS — R4701 Aphasia: Secondary | ICD-10-CM | POA: Diagnosis not present

## 2023-09-20 DIAGNOSIS — E785 Hyperlipidemia, unspecified: Secondary | ICD-10-CM | POA: Diagnosis not present

## 2023-09-21 DIAGNOSIS — E119 Type 2 diabetes mellitus without complications: Secondary | ICD-10-CM | POA: Diagnosis not present

## 2023-09-21 DIAGNOSIS — G8194 Hemiplegia, unspecified affecting left nondominant side: Secondary | ICD-10-CM | POA: Diagnosis not present

## 2023-09-21 DIAGNOSIS — I10 Essential (primary) hypertension: Secondary | ICD-10-CM | POA: Diagnosis not present

## 2023-09-21 DIAGNOSIS — G40909 Epilepsy, unspecified, not intractable, without status epilepticus: Secondary | ICD-10-CM | POA: Diagnosis not present

## 2023-09-21 DIAGNOSIS — R4701 Aphasia: Secondary | ICD-10-CM | POA: Diagnosis not present

## 2023-09-21 DIAGNOSIS — S42202D Unspecified fracture of upper end of left humerus, subsequent encounter for fracture with routine healing: Secondary | ICD-10-CM | POA: Diagnosis not present

## 2023-09-21 DIAGNOSIS — R32 Unspecified urinary incontinence: Secondary | ICD-10-CM | POA: Diagnosis not present

## 2023-09-21 DIAGNOSIS — S01112D Laceration without foreign body of left eyelid and periocular area, subsequent encounter: Secondary | ICD-10-CM | POA: Diagnosis not present

## 2023-09-21 DIAGNOSIS — R296 Repeated falls: Secondary | ICD-10-CM | POA: Diagnosis not present

## 2023-09-21 DIAGNOSIS — K219 Gastro-esophageal reflux disease without esophagitis: Secondary | ICD-10-CM | POA: Diagnosis not present

## 2023-09-21 DIAGNOSIS — E785 Hyperlipidemia, unspecified: Secondary | ICD-10-CM | POA: Diagnosis not present

## 2023-09-21 DIAGNOSIS — C711 Malignant neoplasm of frontal lobe: Secondary | ICD-10-CM | POA: Diagnosis not present

## 2023-09-22 DIAGNOSIS — E785 Hyperlipidemia, unspecified: Secondary | ICD-10-CM | POA: Diagnosis not present

## 2023-09-22 DIAGNOSIS — K219 Gastro-esophageal reflux disease without esophagitis: Secondary | ICD-10-CM | POA: Diagnosis not present

## 2023-09-22 DIAGNOSIS — E119 Type 2 diabetes mellitus without complications: Secondary | ICD-10-CM | POA: Diagnosis not present

## 2023-09-22 DIAGNOSIS — C711 Malignant neoplasm of frontal lobe: Secondary | ICD-10-CM | POA: Diagnosis not present

## 2023-09-22 DIAGNOSIS — S42202D Unspecified fracture of upper end of left humerus, subsequent encounter for fracture with routine healing: Secondary | ICD-10-CM | POA: Diagnosis not present

## 2023-09-22 DIAGNOSIS — R4701 Aphasia: Secondary | ICD-10-CM | POA: Diagnosis not present

## 2023-09-23 DIAGNOSIS — C711 Malignant neoplasm of frontal lobe: Secondary | ICD-10-CM | POA: Diagnosis not present

## 2023-09-23 DIAGNOSIS — S42202D Unspecified fracture of upper end of left humerus, subsequent encounter for fracture with routine healing: Secondary | ICD-10-CM | POA: Diagnosis not present

## 2023-09-23 DIAGNOSIS — E119 Type 2 diabetes mellitus without complications: Secondary | ICD-10-CM | POA: Diagnosis not present

## 2023-09-23 DIAGNOSIS — R4701 Aphasia: Secondary | ICD-10-CM | POA: Diagnosis not present

## 2023-09-23 DIAGNOSIS — K219 Gastro-esophageal reflux disease without esophagitis: Secondary | ICD-10-CM | POA: Diagnosis not present

## 2023-09-23 DIAGNOSIS — E785 Hyperlipidemia, unspecified: Secondary | ICD-10-CM | POA: Diagnosis not present

## 2023-09-24 DIAGNOSIS — E119 Type 2 diabetes mellitus without complications: Secondary | ICD-10-CM | POA: Diagnosis not present

## 2023-09-24 DIAGNOSIS — E785 Hyperlipidemia, unspecified: Secondary | ICD-10-CM | POA: Diagnosis not present

## 2023-09-24 DIAGNOSIS — K219 Gastro-esophageal reflux disease without esophagitis: Secondary | ICD-10-CM | POA: Diagnosis not present

## 2023-09-24 DIAGNOSIS — S42202D Unspecified fracture of upper end of left humerus, subsequent encounter for fracture with routine healing: Secondary | ICD-10-CM | POA: Diagnosis not present

## 2023-09-24 DIAGNOSIS — R4701 Aphasia: Secondary | ICD-10-CM | POA: Diagnosis not present

## 2023-09-24 DIAGNOSIS — C711 Malignant neoplasm of frontal lobe: Secondary | ICD-10-CM | POA: Diagnosis not present

## 2023-09-25 DIAGNOSIS — E785 Hyperlipidemia, unspecified: Secondary | ICD-10-CM | POA: Diagnosis not present

## 2023-09-25 DIAGNOSIS — K219 Gastro-esophageal reflux disease without esophagitis: Secondary | ICD-10-CM | POA: Diagnosis not present

## 2023-09-25 DIAGNOSIS — R4701 Aphasia: Secondary | ICD-10-CM | POA: Diagnosis not present

## 2023-09-25 DIAGNOSIS — E119 Type 2 diabetes mellitus without complications: Secondary | ICD-10-CM | POA: Diagnosis not present

## 2023-09-25 DIAGNOSIS — S42202D Unspecified fracture of upper end of left humerus, subsequent encounter for fracture with routine healing: Secondary | ICD-10-CM | POA: Diagnosis not present

## 2023-09-25 DIAGNOSIS — C711 Malignant neoplasm of frontal lobe: Secondary | ICD-10-CM | POA: Diagnosis not present

## 2023-09-26 DIAGNOSIS — E119 Type 2 diabetes mellitus without complications: Secondary | ICD-10-CM | POA: Diagnosis not present

## 2023-09-26 DIAGNOSIS — E785 Hyperlipidemia, unspecified: Secondary | ICD-10-CM | POA: Diagnosis not present

## 2023-09-26 DIAGNOSIS — S42202D Unspecified fracture of upper end of left humerus, subsequent encounter for fracture with routine healing: Secondary | ICD-10-CM | POA: Diagnosis not present

## 2023-09-26 DIAGNOSIS — R4701 Aphasia: Secondary | ICD-10-CM | POA: Diagnosis not present

## 2023-09-26 DIAGNOSIS — K219 Gastro-esophageal reflux disease without esophagitis: Secondary | ICD-10-CM | POA: Diagnosis not present

## 2023-09-26 DIAGNOSIS — C711 Malignant neoplasm of frontal lobe: Secondary | ICD-10-CM | POA: Diagnosis not present

## 2023-09-27 DIAGNOSIS — E119 Type 2 diabetes mellitus without complications: Secondary | ICD-10-CM | POA: Diagnosis not present

## 2023-09-27 DIAGNOSIS — E785 Hyperlipidemia, unspecified: Secondary | ICD-10-CM | POA: Diagnosis not present

## 2023-09-27 DIAGNOSIS — R4701 Aphasia: Secondary | ICD-10-CM | POA: Diagnosis not present

## 2023-09-27 DIAGNOSIS — K219 Gastro-esophageal reflux disease without esophagitis: Secondary | ICD-10-CM | POA: Diagnosis not present

## 2023-09-27 DIAGNOSIS — C711 Malignant neoplasm of frontal lobe: Secondary | ICD-10-CM | POA: Diagnosis not present

## 2023-09-27 DIAGNOSIS — S42202D Unspecified fracture of upper end of left humerus, subsequent encounter for fracture with routine healing: Secondary | ICD-10-CM | POA: Diagnosis not present

## 2023-09-29 DIAGNOSIS — S42202D Unspecified fracture of upper end of left humerus, subsequent encounter for fracture with routine healing: Secondary | ICD-10-CM | POA: Diagnosis not present

## 2023-09-29 DIAGNOSIS — E785 Hyperlipidemia, unspecified: Secondary | ICD-10-CM | POA: Diagnosis not present

## 2023-09-29 DIAGNOSIS — R4701 Aphasia: Secondary | ICD-10-CM | POA: Diagnosis not present

## 2023-09-29 DIAGNOSIS — C711 Malignant neoplasm of frontal lobe: Secondary | ICD-10-CM | POA: Diagnosis not present

## 2023-09-29 DIAGNOSIS — E119 Type 2 diabetes mellitus without complications: Secondary | ICD-10-CM | POA: Diagnosis not present

## 2023-09-29 DIAGNOSIS — K219 Gastro-esophageal reflux disease without esophagitis: Secondary | ICD-10-CM | POA: Diagnosis not present

## 2023-10-01 DIAGNOSIS — S42202D Unspecified fracture of upper end of left humerus, subsequent encounter for fracture with routine healing: Secondary | ICD-10-CM | POA: Diagnosis not present

## 2023-10-01 DIAGNOSIS — R4701 Aphasia: Secondary | ICD-10-CM | POA: Diagnosis not present

## 2023-10-01 DIAGNOSIS — C711 Malignant neoplasm of frontal lobe: Secondary | ICD-10-CM | POA: Diagnosis not present

## 2023-10-01 DIAGNOSIS — E119 Type 2 diabetes mellitus without complications: Secondary | ICD-10-CM | POA: Diagnosis not present

## 2023-10-01 DIAGNOSIS — K219 Gastro-esophageal reflux disease without esophagitis: Secondary | ICD-10-CM | POA: Diagnosis not present

## 2023-10-01 DIAGNOSIS — E785 Hyperlipidemia, unspecified: Secondary | ICD-10-CM | POA: Diagnosis not present

## 2023-10-04 DIAGNOSIS — E119 Type 2 diabetes mellitus without complications: Secondary | ICD-10-CM | POA: Diagnosis not present

## 2023-10-04 DIAGNOSIS — K219 Gastro-esophageal reflux disease without esophagitis: Secondary | ICD-10-CM | POA: Diagnosis not present

## 2023-10-04 DIAGNOSIS — C711 Malignant neoplasm of frontal lobe: Secondary | ICD-10-CM | POA: Diagnosis not present

## 2023-10-04 DIAGNOSIS — E785 Hyperlipidemia, unspecified: Secondary | ICD-10-CM | POA: Diagnosis not present

## 2023-10-04 DIAGNOSIS — S42202D Unspecified fracture of upper end of left humerus, subsequent encounter for fracture with routine healing: Secondary | ICD-10-CM | POA: Diagnosis not present

## 2023-10-04 DIAGNOSIS — R4701 Aphasia: Secondary | ICD-10-CM | POA: Diagnosis not present

## 2023-10-06 DIAGNOSIS — E119 Type 2 diabetes mellitus without complications: Secondary | ICD-10-CM | POA: Diagnosis not present

## 2023-10-06 DIAGNOSIS — C711 Malignant neoplasm of frontal lobe: Secondary | ICD-10-CM | POA: Diagnosis not present

## 2023-10-06 DIAGNOSIS — E785 Hyperlipidemia, unspecified: Secondary | ICD-10-CM | POA: Diagnosis not present

## 2023-10-06 DIAGNOSIS — K219 Gastro-esophageal reflux disease without esophagitis: Secondary | ICD-10-CM | POA: Diagnosis not present

## 2023-10-06 DIAGNOSIS — S42202D Unspecified fracture of upper end of left humerus, subsequent encounter for fracture with routine healing: Secondary | ICD-10-CM | POA: Diagnosis not present

## 2023-10-06 DIAGNOSIS — R4701 Aphasia: Secondary | ICD-10-CM | POA: Diagnosis not present

## 2023-10-08 DIAGNOSIS — K219 Gastro-esophageal reflux disease without esophagitis: Secondary | ICD-10-CM | POA: Diagnosis not present

## 2023-10-08 DIAGNOSIS — R4701 Aphasia: Secondary | ICD-10-CM | POA: Diagnosis not present

## 2023-10-08 DIAGNOSIS — E119 Type 2 diabetes mellitus without complications: Secondary | ICD-10-CM | POA: Diagnosis not present

## 2023-10-08 DIAGNOSIS — S42202D Unspecified fracture of upper end of left humerus, subsequent encounter for fracture with routine healing: Secondary | ICD-10-CM | POA: Diagnosis not present

## 2023-10-08 DIAGNOSIS — E785 Hyperlipidemia, unspecified: Secondary | ICD-10-CM | POA: Diagnosis not present

## 2023-10-08 DIAGNOSIS — C711 Malignant neoplasm of frontal lobe: Secondary | ICD-10-CM | POA: Diagnosis not present

## 2023-10-11 DIAGNOSIS — E119 Type 2 diabetes mellitus without complications: Secondary | ICD-10-CM | POA: Diagnosis not present

## 2023-10-11 DIAGNOSIS — S42202D Unspecified fracture of upper end of left humerus, subsequent encounter for fracture with routine healing: Secondary | ICD-10-CM | POA: Diagnosis not present

## 2023-10-11 DIAGNOSIS — C711 Malignant neoplasm of frontal lobe: Secondary | ICD-10-CM | POA: Diagnosis not present

## 2023-10-11 DIAGNOSIS — K219 Gastro-esophageal reflux disease without esophagitis: Secondary | ICD-10-CM | POA: Diagnosis not present

## 2023-10-11 DIAGNOSIS — R4701 Aphasia: Secondary | ICD-10-CM | POA: Diagnosis not present

## 2023-10-11 DIAGNOSIS — E785 Hyperlipidemia, unspecified: Secondary | ICD-10-CM | POA: Diagnosis not present

## 2023-10-13 DIAGNOSIS — E119 Type 2 diabetes mellitus without complications: Secondary | ICD-10-CM | POA: Diagnosis not present

## 2023-10-13 DIAGNOSIS — E785 Hyperlipidemia, unspecified: Secondary | ICD-10-CM | POA: Diagnosis not present

## 2023-10-13 DIAGNOSIS — K219 Gastro-esophageal reflux disease without esophagitis: Secondary | ICD-10-CM | POA: Diagnosis not present

## 2023-10-13 DIAGNOSIS — S42202D Unspecified fracture of upper end of left humerus, subsequent encounter for fracture with routine healing: Secondary | ICD-10-CM | POA: Diagnosis not present

## 2023-10-13 DIAGNOSIS — R4701 Aphasia: Secondary | ICD-10-CM | POA: Diagnosis not present

## 2023-10-13 DIAGNOSIS — C711 Malignant neoplasm of frontal lobe: Secondary | ICD-10-CM | POA: Diagnosis not present

## 2023-10-14 ENCOUNTER — Telehealth: Payer: Self-pay | Admitting: *Deleted

## 2023-10-14 NOTE — Telephone Encounter (Signed)
 Hospice Recertification signed by Dr Buckley & faxed to [facility], [phone].  Fax confirmation received.

## 2023-10-15 ENCOUNTER — Other Ambulatory Visit: Payer: Self-pay

## 2023-10-15 DIAGNOSIS — E119 Type 2 diabetes mellitus without complications: Secondary | ICD-10-CM | POA: Diagnosis not present

## 2023-10-15 DIAGNOSIS — R4701 Aphasia: Secondary | ICD-10-CM | POA: Diagnosis not present

## 2023-10-15 DIAGNOSIS — C711 Malignant neoplasm of frontal lobe: Secondary | ICD-10-CM | POA: Diagnosis not present

## 2023-10-15 DIAGNOSIS — E785 Hyperlipidemia, unspecified: Secondary | ICD-10-CM | POA: Diagnosis not present

## 2023-10-15 DIAGNOSIS — K219 Gastro-esophageal reflux disease without esophagitis: Secondary | ICD-10-CM | POA: Diagnosis not present

## 2023-10-15 DIAGNOSIS — S42202D Unspecified fracture of upper end of left humerus, subsequent encounter for fracture with routine healing: Secondary | ICD-10-CM | POA: Diagnosis not present

## 2023-10-18 DIAGNOSIS — S42202D Unspecified fracture of upper end of left humerus, subsequent encounter for fracture with routine healing: Secondary | ICD-10-CM | POA: Diagnosis not present

## 2023-10-18 DIAGNOSIS — K219 Gastro-esophageal reflux disease without esophagitis: Secondary | ICD-10-CM | POA: Diagnosis not present

## 2023-10-18 DIAGNOSIS — C711 Malignant neoplasm of frontal lobe: Secondary | ICD-10-CM | POA: Diagnosis not present

## 2023-10-18 DIAGNOSIS — E119 Type 2 diabetes mellitus without complications: Secondary | ICD-10-CM | POA: Diagnosis not present

## 2023-10-18 DIAGNOSIS — E785 Hyperlipidemia, unspecified: Secondary | ICD-10-CM | POA: Diagnosis not present

## 2023-10-18 DIAGNOSIS — R4701 Aphasia: Secondary | ICD-10-CM | POA: Diagnosis not present

## 2023-10-20 DIAGNOSIS — S42202D Unspecified fracture of upper end of left humerus, subsequent encounter for fracture with routine healing: Secondary | ICD-10-CM | POA: Diagnosis not present

## 2023-10-20 DIAGNOSIS — E119 Type 2 diabetes mellitus without complications: Secondary | ICD-10-CM | POA: Diagnosis not present

## 2023-10-20 DIAGNOSIS — K219 Gastro-esophageal reflux disease without esophagitis: Secondary | ICD-10-CM | POA: Diagnosis not present

## 2023-10-20 DIAGNOSIS — R4701 Aphasia: Secondary | ICD-10-CM | POA: Diagnosis not present

## 2023-10-20 DIAGNOSIS — E785 Hyperlipidemia, unspecified: Secondary | ICD-10-CM | POA: Diagnosis not present

## 2023-10-20 DIAGNOSIS — C711 Malignant neoplasm of frontal lobe: Secondary | ICD-10-CM | POA: Diagnosis not present

## 2023-10-22 DIAGNOSIS — R4701 Aphasia: Secondary | ICD-10-CM | POA: Diagnosis not present

## 2023-10-22 DIAGNOSIS — S01112D Laceration without foreign body of left eyelid and periocular area, subsequent encounter: Secondary | ICD-10-CM | POA: Diagnosis not present

## 2023-10-22 DIAGNOSIS — K219 Gastro-esophageal reflux disease without esophagitis: Secondary | ICD-10-CM | POA: Diagnosis not present

## 2023-10-22 DIAGNOSIS — G40909 Epilepsy, unspecified, not intractable, without status epilepticus: Secondary | ICD-10-CM | POA: Diagnosis not present

## 2023-10-22 DIAGNOSIS — C711 Malignant neoplasm of frontal lobe: Secondary | ICD-10-CM | POA: Diagnosis not present

## 2023-10-22 DIAGNOSIS — S42202D Unspecified fracture of upper end of left humerus, subsequent encounter for fracture with routine healing: Secondary | ICD-10-CM | POA: Diagnosis not present

## 2023-10-22 DIAGNOSIS — I10 Essential (primary) hypertension: Secondary | ICD-10-CM | POA: Diagnosis not present

## 2023-10-22 DIAGNOSIS — E785 Hyperlipidemia, unspecified: Secondary | ICD-10-CM | POA: Diagnosis not present

## 2023-10-22 DIAGNOSIS — R32 Unspecified urinary incontinence: Secondary | ICD-10-CM | POA: Diagnosis not present

## 2023-10-22 DIAGNOSIS — R296 Repeated falls: Secondary | ICD-10-CM | POA: Diagnosis not present

## 2023-10-22 DIAGNOSIS — G8194 Hemiplegia, unspecified affecting left nondominant side: Secondary | ICD-10-CM | POA: Diagnosis not present

## 2023-10-22 DIAGNOSIS — E119 Type 2 diabetes mellitus without complications: Secondary | ICD-10-CM | POA: Diagnosis not present

## 2023-10-25 DIAGNOSIS — K219 Gastro-esophageal reflux disease without esophagitis: Secondary | ICD-10-CM | POA: Diagnosis not present

## 2023-10-25 DIAGNOSIS — S42202D Unspecified fracture of upper end of left humerus, subsequent encounter for fracture with routine healing: Secondary | ICD-10-CM | POA: Diagnosis not present

## 2023-10-25 DIAGNOSIS — E785 Hyperlipidemia, unspecified: Secondary | ICD-10-CM | POA: Diagnosis not present

## 2023-10-25 DIAGNOSIS — C711 Malignant neoplasm of frontal lobe: Secondary | ICD-10-CM | POA: Diagnosis not present

## 2023-10-25 DIAGNOSIS — R4701 Aphasia: Secondary | ICD-10-CM | POA: Diagnosis not present

## 2023-10-25 DIAGNOSIS — E119 Type 2 diabetes mellitus without complications: Secondary | ICD-10-CM | POA: Diagnosis not present

## 2023-10-26 DIAGNOSIS — R4701 Aphasia: Secondary | ICD-10-CM | POA: Diagnosis not present

## 2023-10-26 DIAGNOSIS — E119 Type 2 diabetes mellitus without complications: Secondary | ICD-10-CM | POA: Diagnosis not present

## 2023-10-26 DIAGNOSIS — K219 Gastro-esophageal reflux disease without esophagitis: Secondary | ICD-10-CM | POA: Diagnosis not present

## 2023-10-26 DIAGNOSIS — R402421 Glasgow coma scale score 9-12, in the field [EMT or ambulance]: Secondary | ICD-10-CM | POA: Diagnosis not present

## 2023-10-26 DIAGNOSIS — S42202D Unspecified fracture of upper end of left humerus, subsequent encounter for fracture with routine healing: Secondary | ICD-10-CM | POA: Diagnosis not present

## 2023-10-26 DIAGNOSIS — E785 Hyperlipidemia, unspecified: Secondary | ICD-10-CM | POA: Diagnosis not present

## 2023-10-26 DIAGNOSIS — C711 Malignant neoplasm of frontal lobe: Secondary | ICD-10-CM | POA: Diagnosis not present

## 2023-10-26 DIAGNOSIS — Z743 Need for continuous supervision: Secondary | ICD-10-CM | POA: Diagnosis not present

## 2023-10-27 DIAGNOSIS — R4701 Aphasia: Secondary | ICD-10-CM | POA: Diagnosis not present

## 2023-10-27 DIAGNOSIS — C711 Malignant neoplasm of frontal lobe: Secondary | ICD-10-CM | POA: Diagnosis not present

## 2023-10-27 DIAGNOSIS — K219 Gastro-esophageal reflux disease without esophagitis: Secondary | ICD-10-CM | POA: Diagnosis not present

## 2023-10-27 DIAGNOSIS — E785 Hyperlipidemia, unspecified: Secondary | ICD-10-CM | POA: Diagnosis not present

## 2023-10-27 DIAGNOSIS — E119 Type 2 diabetes mellitus without complications: Secondary | ICD-10-CM | POA: Diagnosis not present

## 2023-10-27 DIAGNOSIS — S42202D Unspecified fracture of upper end of left humerus, subsequent encounter for fracture with routine healing: Secondary | ICD-10-CM | POA: Diagnosis not present

## 2023-10-28 DIAGNOSIS — K219 Gastro-esophageal reflux disease without esophagitis: Secondary | ICD-10-CM | POA: Diagnosis not present

## 2023-10-28 DIAGNOSIS — R4701 Aphasia: Secondary | ICD-10-CM | POA: Diagnosis not present

## 2023-10-28 DIAGNOSIS — C711 Malignant neoplasm of frontal lobe: Secondary | ICD-10-CM | POA: Diagnosis not present

## 2023-10-28 DIAGNOSIS — S42202D Unspecified fracture of upper end of left humerus, subsequent encounter for fracture with routine healing: Secondary | ICD-10-CM | POA: Diagnosis not present

## 2023-10-28 DIAGNOSIS — E785 Hyperlipidemia, unspecified: Secondary | ICD-10-CM | POA: Diagnosis not present

## 2023-10-28 DIAGNOSIS — E119 Type 2 diabetes mellitus without complications: Secondary | ICD-10-CM | POA: Diagnosis not present

## 2023-10-29 DIAGNOSIS — C711 Malignant neoplasm of frontal lobe: Secondary | ICD-10-CM | POA: Diagnosis not present

## 2023-10-29 DIAGNOSIS — E785 Hyperlipidemia, unspecified: Secondary | ICD-10-CM | POA: Diagnosis not present

## 2023-10-29 DIAGNOSIS — R4701 Aphasia: Secondary | ICD-10-CM | POA: Diagnosis not present

## 2023-10-29 DIAGNOSIS — K219 Gastro-esophageal reflux disease without esophagitis: Secondary | ICD-10-CM | POA: Diagnosis not present

## 2023-10-29 DIAGNOSIS — E119 Type 2 diabetes mellitus without complications: Secondary | ICD-10-CM | POA: Diagnosis not present

## 2023-10-29 DIAGNOSIS — S42202D Unspecified fracture of upper end of left humerus, subsequent encounter for fracture with routine healing: Secondary | ICD-10-CM | POA: Diagnosis not present

## 2023-10-30 DIAGNOSIS — R4701 Aphasia: Secondary | ICD-10-CM | POA: Diagnosis not present

## 2023-10-30 DIAGNOSIS — S42202D Unspecified fracture of upper end of left humerus, subsequent encounter for fracture with routine healing: Secondary | ICD-10-CM | POA: Diagnosis not present

## 2023-10-30 DIAGNOSIS — K219 Gastro-esophageal reflux disease without esophagitis: Secondary | ICD-10-CM | POA: Diagnosis not present

## 2023-10-30 DIAGNOSIS — E119 Type 2 diabetes mellitus without complications: Secondary | ICD-10-CM | POA: Diagnosis not present

## 2023-10-30 DIAGNOSIS — C711 Malignant neoplasm of frontal lobe: Secondary | ICD-10-CM | POA: Diagnosis not present

## 2023-10-30 DIAGNOSIS — E785 Hyperlipidemia, unspecified: Secondary | ICD-10-CM | POA: Diagnosis not present

## 2023-10-31 DIAGNOSIS — E119 Type 2 diabetes mellitus without complications: Secondary | ICD-10-CM | POA: Diagnosis not present

## 2023-10-31 DIAGNOSIS — E785 Hyperlipidemia, unspecified: Secondary | ICD-10-CM | POA: Diagnosis not present

## 2023-10-31 DIAGNOSIS — R4701 Aphasia: Secondary | ICD-10-CM | POA: Diagnosis not present

## 2023-10-31 DIAGNOSIS — C711 Malignant neoplasm of frontal lobe: Secondary | ICD-10-CM | POA: Diagnosis not present

## 2023-10-31 DIAGNOSIS — K219 Gastro-esophageal reflux disease without esophagitis: Secondary | ICD-10-CM | POA: Diagnosis not present

## 2023-10-31 DIAGNOSIS — S42202D Unspecified fracture of upper end of left humerus, subsequent encounter for fracture with routine healing: Secondary | ICD-10-CM | POA: Diagnosis not present

## 2023-11-01 DIAGNOSIS — K219 Gastro-esophageal reflux disease without esophagitis: Secondary | ICD-10-CM | POA: Diagnosis not present

## 2023-11-01 DIAGNOSIS — E119 Type 2 diabetes mellitus without complications: Secondary | ICD-10-CM | POA: Diagnosis not present

## 2023-11-01 DIAGNOSIS — R4701 Aphasia: Secondary | ICD-10-CM | POA: Diagnosis not present

## 2023-11-01 DIAGNOSIS — C711 Malignant neoplasm of frontal lobe: Secondary | ICD-10-CM | POA: Diagnosis not present

## 2023-11-01 DIAGNOSIS — E785 Hyperlipidemia, unspecified: Secondary | ICD-10-CM | POA: Diagnosis not present

## 2023-11-01 DIAGNOSIS — S42202D Unspecified fracture of upper end of left humerus, subsequent encounter for fracture with routine healing: Secondary | ICD-10-CM | POA: Diagnosis not present

## 2023-11-03 DIAGNOSIS — C711 Malignant neoplasm of frontal lobe: Secondary | ICD-10-CM | POA: Diagnosis not present

## 2023-11-03 DIAGNOSIS — K219 Gastro-esophageal reflux disease without esophagitis: Secondary | ICD-10-CM | POA: Diagnosis not present

## 2023-11-03 DIAGNOSIS — S42202D Unspecified fracture of upper end of left humerus, subsequent encounter for fracture with routine healing: Secondary | ICD-10-CM | POA: Diagnosis not present

## 2023-11-03 DIAGNOSIS — E119 Type 2 diabetes mellitus without complications: Secondary | ICD-10-CM | POA: Diagnosis not present

## 2023-11-03 DIAGNOSIS — R4701 Aphasia: Secondary | ICD-10-CM | POA: Diagnosis not present

## 2023-11-03 DIAGNOSIS — E785 Hyperlipidemia, unspecified: Secondary | ICD-10-CM | POA: Diagnosis not present

## 2023-11-08 DIAGNOSIS — E785 Hyperlipidemia, unspecified: Secondary | ICD-10-CM | POA: Diagnosis not present

## 2023-11-08 DIAGNOSIS — R4701 Aphasia: Secondary | ICD-10-CM | POA: Diagnosis not present

## 2023-11-08 DIAGNOSIS — K219 Gastro-esophageal reflux disease without esophagitis: Secondary | ICD-10-CM | POA: Diagnosis not present

## 2023-11-08 DIAGNOSIS — E119 Type 2 diabetes mellitus without complications: Secondary | ICD-10-CM | POA: Diagnosis not present

## 2023-11-08 DIAGNOSIS — S42202D Unspecified fracture of upper end of left humerus, subsequent encounter for fracture with routine healing: Secondary | ICD-10-CM | POA: Diagnosis not present

## 2023-11-08 DIAGNOSIS — C711 Malignant neoplasm of frontal lobe: Secondary | ICD-10-CM | POA: Diagnosis not present

## 2023-11-10 DIAGNOSIS — S42202D Unspecified fracture of upper end of left humerus, subsequent encounter for fracture with routine healing: Secondary | ICD-10-CM | POA: Diagnosis not present

## 2023-11-10 DIAGNOSIS — R4701 Aphasia: Secondary | ICD-10-CM | POA: Diagnosis not present

## 2023-11-10 DIAGNOSIS — E785 Hyperlipidemia, unspecified: Secondary | ICD-10-CM | POA: Diagnosis not present

## 2023-11-10 DIAGNOSIS — K219 Gastro-esophageal reflux disease without esophagitis: Secondary | ICD-10-CM | POA: Diagnosis not present

## 2023-11-10 DIAGNOSIS — E119 Type 2 diabetes mellitus without complications: Secondary | ICD-10-CM | POA: Diagnosis not present

## 2023-11-10 DIAGNOSIS — C711 Malignant neoplasm of frontal lobe: Secondary | ICD-10-CM | POA: Diagnosis not present

## 2023-11-15 DIAGNOSIS — S42202D Unspecified fracture of upper end of left humerus, subsequent encounter for fracture with routine healing: Secondary | ICD-10-CM | POA: Diagnosis not present

## 2023-11-15 DIAGNOSIS — E785 Hyperlipidemia, unspecified: Secondary | ICD-10-CM | POA: Diagnosis not present

## 2023-11-15 DIAGNOSIS — K219 Gastro-esophageal reflux disease without esophagitis: Secondary | ICD-10-CM | POA: Diagnosis not present

## 2023-11-15 DIAGNOSIS — R4701 Aphasia: Secondary | ICD-10-CM | POA: Diagnosis not present

## 2023-11-15 DIAGNOSIS — E119 Type 2 diabetes mellitus without complications: Secondary | ICD-10-CM | POA: Diagnosis not present

## 2023-11-15 DIAGNOSIS — C711 Malignant neoplasm of frontal lobe: Secondary | ICD-10-CM | POA: Diagnosis not present

## 2023-11-17 DIAGNOSIS — C711 Malignant neoplasm of frontal lobe: Secondary | ICD-10-CM | POA: Diagnosis not present

## 2023-11-17 DIAGNOSIS — E785 Hyperlipidemia, unspecified: Secondary | ICD-10-CM | POA: Diagnosis not present

## 2023-11-17 DIAGNOSIS — E119 Type 2 diabetes mellitus without complications: Secondary | ICD-10-CM | POA: Diagnosis not present

## 2023-11-17 DIAGNOSIS — S42202D Unspecified fracture of upper end of left humerus, subsequent encounter for fracture with routine healing: Secondary | ICD-10-CM | POA: Diagnosis not present

## 2023-11-17 DIAGNOSIS — R4701 Aphasia: Secondary | ICD-10-CM | POA: Diagnosis not present

## 2023-11-17 DIAGNOSIS — K219 Gastro-esophageal reflux disease without esophagitis: Secondary | ICD-10-CM | POA: Diagnosis not present

## 2023-11-19 DIAGNOSIS — K219 Gastro-esophageal reflux disease without esophagitis: Secondary | ICD-10-CM | POA: Diagnosis not present

## 2023-11-19 DIAGNOSIS — R4701 Aphasia: Secondary | ICD-10-CM | POA: Diagnosis not present

## 2023-11-19 DIAGNOSIS — S42202D Unspecified fracture of upper end of left humerus, subsequent encounter for fracture with routine healing: Secondary | ICD-10-CM | POA: Diagnosis not present

## 2023-11-19 DIAGNOSIS — C711 Malignant neoplasm of frontal lobe: Secondary | ICD-10-CM | POA: Diagnosis not present

## 2023-11-19 DIAGNOSIS — E119 Type 2 diabetes mellitus without complications: Secondary | ICD-10-CM | POA: Diagnosis not present

## 2023-11-19 DIAGNOSIS — E785 Hyperlipidemia, unspecified: Secondary | ICD-10-CM | POA: Diagnosis not present

## 2023-11-21 DIAGNOSIS — C711 Malignant neoplasm of frontal lobe: Secondary | ICD-10-CM | POA: Diagnosis not present

## 2023-11-21 DIAGNOSIS — E785 Hyperlipidemia, unspecified: Secondary | ICD-10-CM | POA: Diagnosis not present

## 2023-11-21 DIAGNOSIS — K219 Gastro-esophageal reflux disease without esophagitis: Secondary | ICD-10-CM | POA: Diagnosis not present

## 2023-11-21 DIAGNOSIS — E119 Type 2 diabetes mellitus without complications: Secondary | ICD-10-CM | POA: Diagnosis not present

## 2023-11-21 DIAGNOSIS — S42202D Unspecified fracture of upper end of left humerus, subsequent encounter for fracture with routine healing: Secondary | ICD-10-CM | POA: Diagnosis not present

## 2023-11-21 DIAGNOSIS — R4701 Aphasia: Secondary | ICD-10-CM | POA: Diagnosis not present

## 2023-11-22 DEATH — deceased
